# Patient Record
Sex: Female | Born: 1980 | Race: Black or African American | Hispanic: No | Marital: Married | Smoking: Never smoker
Health system: Southern US, Community
[De-identification: ages and names within clinical notes are randomized; demographics above are authoritative.]

## PROBLEM LIST (undated history)

## (undated) ENCOUNTER — Inpatient Hospital Stay (HOSPITAL_COMMUNITY): Payer: Self-pay

## (undated) DIAGNOSIS — K219 Gastro-esophageal reflux disease without esophagitis: Secondary | ICD-10-CM

## (undated) DIAGNOSIS — M1990 Unspecified osteoarthritis, unspecified site: Secondary | ICD-10-CM

## (undated) DIAGNOSIS — M549 Dorsalgia, unspecified: Secondary | ICD-10-CM

## (undated) DIAGNOSIS — I10 Essential (primary) hypertension: Secondary | ICD-10-CM

## (undated) DIAGNOSIS — Z6841 Body Mass Index (BMI) 40.0 and over, adult: Secondary | ICD-10-CM

## (undated) DIAGNOSIS — K760 Fatty (change of) liver, not elsewhere classified: Secondary | ICD-10-CM

## (undated) DIAGNOSIS — F419 Anxiety disorder, unspecified: Secondary | ICD-10-CM

## (undated) DIAGNOSIS — K5000 Crohn's disease of small intestine without complications: Secondary | ICD-10-CM

## (undated) DIAGNOSIS — K5900 Constipation, unspecified: Secondary | ICD-10-CM

## (undated) DIAGNOSIS — L509 Urticaria, unspecified: Secondary | ICD-10-CM

## (undated) DIAGNOSIS — E559 Vitamin D deficiency, unspecified: Secondary | ICD-10-CM

## (undated) DIAGNOSIS — J069 Acute upper respiratory infection, unspecified: Secondary | ICD-10-CM

## (undated) DIAGNOSIS — S3210XA Unspecified fracture of sacrum, initial encounter for closed fracture: Secondary | ICD-10-CM

## (undated) DIAGNOSIS — M2550 Pain in unspecified joint: Secondary | ICD-10-CM

## (undated) DIAGNOSIS — T7840XA Allergy, unspecified, initial encounter: Secondary | ICD-10-CM

## (undated) DIAGNOSIS — J45909 Unspecified asthma, uncomplicated: Secondary | ICD-10-CM

## (undated) DIAGNOSIS — E538 Deficiency of other specified B group vitamins: Secondary | ICD-10-CM

## (undated) DIAGNOSIS — K589 Irritable bowel syndrome without diarrhea: Secondary | ICD-10-CM

## (undated) HISTORY — DX: Vitamin D deficiency, unspecified: E55.9

## (undated) HISTORY — DX: Body Mass Index (BMI) 40.0 and over, adult: Z684

## (undated) HISTORY — PX: ADENOIDECTOMY: SUR15

## (undated) HISTORY — DX: Constipation, unspecified: K59.00

## (undated) HISTORY — DX: Dorsalgia, unspecified: M54.9

## (undated) HISTORY — PX: COLONOSCOPY: SHX174

## (undated) HISTORY — PX: TONSILLECTOMY: SUR1361

## (undated) HISTORY — DX: Allergy, unspecified, initial encounter: T78.40XA

## (undated) HISTORY — DX: Acute upper respiratory infection, unspecified: J06.9

## (undated) HISTORY — DX: Morbid (severe) obesity due to excess calories: E66.01

## (undated) HISTORY — DX: Pain in unspecified joint: M25.50

## (undated) HISTORY — DX: Essential (primary) hypertension: I10

## (undated) HISTORY — DX: Anxiety disorder, unspecified: F41.9

## (undated) HISTORY — DX: Gastro-esophageal reflux disease without esophagitis: K21.9

## (undated) HISTORY — DX: Fatty (change of) liver, not elsewhere classified: K76.0

## (undated) HISTORY — DX: Deficiency of other specified B group vitamins: E53.8

## (undated) HISTORY — DX: Crohn's disease of small intestine without complications: K50.00

## (undated) HISTORY — DX: Urticaria, unspecified: L50.9

## (undated) SURGERY — Surgical Case
Anesthesia: *Unknown

## (undated) SURGERY — DILATION AND EVACUATION, UTERUS
Anesthesia: Choice

---

## 2005-04-08 ENCOUNTER — Emergency Department (HOSPITAL_COMMUNITY): Admission: EM | Admit: 2005-04-08 | Discharge: 2005-04-08 | Payer: Self-pay | Admitting: Emergency Medicine

## 2005-04-12 HISTORY — PX: TONSILLECTOMY AND ADENOIDECTOMY: SHX28

## 2005-11-05 ENCOUNTER — Emergency Department (HOSPITAL_COMMUNITY): Admission: EM | Admit: 2005-11-05 | Discharge: 2005-11-06 | Payer: Self-pay | Admitting: Emergency Medicine

## 2006-02-16 ENCOUNTER — Emergency Department (HOSPITAL_COMMUNITY): Admission: EM | Admit: 2006-02-16 | Discharge: 2006-02-16 | Payer: Self-pay | Admitting: Family Medicine

## 2006-03-29 ENCOUNTER — Emergency Department (HOSPITAL_COMMUNITY): Admission: EM | Admit: 2006-03-29 | Discharge: 2006-03-29 | Payer: Self-pay | Admitting: Emergency Medicine

## 2006-08-26 ENCOUNTER — Emergency Department (HOSPITAL_COMMUNITY): Admission: EM | Admit: 2006-08-26 | Discharge: 2006-08-26 | Payer: Self-pay | Admitting: Family Medicine

## 2006-11-30 ENCOUNTER — Emergency Department (HOSPITAL_COMMUNITY): Admission: EM | Admit: 2006-11-30 | Discharge: 2006-11-30 | Payer: Self-pay | Admitting: Emergency Medicine

## 2007-05-22 ENCOUNTER — Encounter: Admission: RE | Admit: 2007-05-22 | Discharge: 2007-05-22 | Payer: Self-pay | Admitting: Family Medicine

## 2008-01-17 ENCOUNTER — Emergency Department (HOSPITAL_COMMUNITY): Admission: EM | Admit: 2008-01-17 | Discharge: 2008-01-17 | Payer: Self-pay | Admitting: Emergency Medicine

## 2008-06-28 ENCOUNTER — Other Ambulatory Visit: Admission: RE | Admit: 2008-06-28 | Discharge: 2008-06-28 | Payer: Self-pay | Admitting: Family Medicine

## 2008-12-13 ENCOUNTER — Emergency Department (HOSPITAL_COMMUNITY): Admission: EM | Admit: 2008-12-13 | Discharge: 2008-12-13 | Payer: Self-pay | Admitting: Emergency Medicine

## 2009-11-25 ENCOUNTER — Emergency Department (HOSPITAL_BASED_OUTPATIENT_CLINIC_OR_DEPARTMENT_OTHER): Admission: EM | Admit: 2009-11-25 | Discharge: 2009-11-25 | Payer: Self-pay | Admitting: Emergency Medicine

## 2009-11-25 ENCOUNTER — Ambulatory Visit: Payer: Self-pay | Admitting: Radiology

## 2010-01-11 ENCOUNTER — Emergency Department (HOSPITAL_BASED_OUTPATIENT_CLINIC_OR_DEPARTMENT_OTHER): Admission: EM | Admit: 2010-01-11 | Discharge: 2010-01-11 | Payer: Self-pay | Admitting: Emergency Medicine

## 2010-01-11 ENCOUNTER — Ambulatory Visit: Payer: Self-pay | Admitting: Diagnostic Radiology

## 2010-06-26 LAB — HEPATIC FUNCTION PANEL
Albumin: 3.8 g/dL (ref 3.5–5.2)
Alkaline Phosphatase: 99 U/L (ref 39–117)
Indirect Bilirubin: 0.7 mg/dL (ref 0.3–0.9)
Total Protein: 7.9 g/dL (ref 6.0–8.3)

## 2010-06-26 LAB — DIFFERENTIAL
Basophils Absolute: 0 10*3/uL (ref 0.0–0.1)
Basophils Relative: 1 % (ref 0–1)
Eosinophils Absolute: 0.1 10*3/uL (ref 0.0–0.7)
Monocytes Relative: 6 % (ref 3–12)
Neutro Abs: 4.3 10*3/uL (ref 1.7–7.7)
Neutrophils Relative %: 58 % (ref 43–77)

## 2010-06-26 LAB — BASIC METABOLIC PANEL
BUN: 8 mg/dL (ref 6–23)
CO2: 26 mEq/L (ref 19–32)
Calcium: 9.1 mg/dL (ref 8.4–10.5)
Creatinine, Ser: 0.8 mg/dL (ref 0.4–1.2)
GFR calc non Af Amer: >60 mL/min (ref >60)
Glucose, Bld: 89 mg/dL (ref 70–99)
Sodium: 141 mEq/L (ref 135–145)

## 2010-06-26 LAB — URINALYSIS, ROUTINE W REFLEX MICROSCOPIC
Bilirubin Urine: NEGATIVE
Hgb urine dipstick: NEGATIVE
Nitrite: NEGATIVE
Protein, ur: NEGATIVE mg/dL
Specific Gravity, Urine: 1.023 (ref 1.005–1.030)
Urobilinogen, UA: 0.2 mg/dL (ref 0.0–1.0)

## 2010-06-26 LAB — CBC
MCH: 27.5 pg (ref 26.0–34.0)
MCHC: 33.6 g/dL (ref 30.0–36.0)
Platelets: 269 10*3/uL (ref 150–400)
RDW: 12 % (ref 11.5–15.5)

## 2010-06-26 LAB — PREGNANCY, URINE: Preg Test, Ur: NEGATIVE

## 2010-10-16 ENCOUNTER — Other Ambulatory Visit: Payer: Self-pay

## 2010-10-16 ENCOUNTER — Emergency Department (INDEPENDENT_AMBULATORY_CARE_PROVIDER_SITE_OTHER): Payer: BC Managed Care – PPO

## 2010-10-16 ENCOUNTER — Emergency Department (HOSPITAL_BASED_OUTPATIENT_CLINIC_OR_DEPARTMENT_OTHER)
Admission: EM | Admit: 2010-10-16 | Discharge: 2010-10-16 | Disposition: A | Discharge status: Home / Self Care | Payer: BC Managed Care – PPO | Attending: Emergency Medicine | Admitting: Emergency Medicine

## 2010-10-16 DIAGNOSIS — R0602 Shortness of breath: Secondary | ICD-10-CM

## 2010-10-16 DIAGNOSIS — I10 Essential (primary) hypertension: Secondary | ICD-10-CM | POA: Insufficient documentation

## 2010-10-16 DIAGNOSIS — R300 Dysuria: Secondary | ICD-10-CM | POA: Insufficient documentation

## 2010-10-16 DIAGNOSIS — Z79899 Other long term (current) drug therapy: Secondary | ICD-10-CM | POA: Insufficient documentation

## 2010-10-16 DIAGNOSIS — J45909 Unspecified asthma, uncomplicated: Secondary | ICD-10-CM | POA: Insufficient documentation

## 2010-10-16 DIAGNOSIS — R0609 Other forms of dyspnea: Secondary | ICD-10-CM | POA: Insufficient documentation

## 2010-10-16 DIAGNOSIS — R002 Palpitations: Secondary | ICD-10-CM | POA: Insufficient documentation

## 2010-10-16 DIAGNOSIS — R0989 Other specified symptoms and signs involving the circulatory and respiratory systems: Secondary | ICD-10-CM | POA: Insufficient documentation

## 2010-10-16 DIAGNOSIS — R0789 Other chest pain: Secondary | ICD-10-CM

## 2010-10-16 LAB — CBC
Hemoglobin: 12.6 g/dL (ref 12.0–15.0)
MCV: 78.4 fL (ref 78.0–100.0)
Platelets: 253 10*3/uL (ref 150–400)
RBC: 4.76 MIL/uL (ref 3.87–5.11)
WBC: 9.7 10*3/uL (ref 4.0–10.5)

## 2010-10-16 LAB — DIFFERENTIAL
Basophils Relative: 0 % (ref 0–1)
Eosinophils Absolute: 0.1 10*3/uL (ref 0.0–0.7)
Lymphs Abs: 4.3 10*3/uL — ABNORMAL HIGH (ref 0.7–4.0)
Monocytes Relative: 10 % (ref 3–12)
Neutro Abs: 4.3 10*3/uL (ref 1.7–7.7)
Neutrophils Relative %: 44 % (ref 43–77)

## 2010-10-16 LAB — D-DIMER, QUANTITATIVE: D-Dimer, Quant: 0.27 ug/mL-FEU (ref 0.00–0.48)

## 2010-10-16 LAB — BASIC METABOLIC PANEL
CO2: 24 mEq/L (ref 19–32)
Chloride: 103 mEq/L (ref 96–112)
GFR calc Af Amer: >60 mL/min (ref >60)
Potassium: 3.7 mEq/L (ref 3.5–5.1)

## 2011-01-11 LAB — COMPREHENSIVE METABOLIC PANEL
AST: 26
CO2: 21
Calcium: 8.3 — ABNORMAL LOW
Creatinine, Ser: 0.76
GFR calc Af Amer: >60
GFR calc non Af Amer: >60
Total Protein: 6.7

## 2011-01-11 LAB — URINALYSIS, ROUTINE W REFLEX MICROSCOPIC
Glucose, UA: NEGATIVE
Nitrite: NEGATIVE
Specific Gravity, Urine: 1.02
pH: 5.5

## 2011-01-11 LAB — CBC
MCHC: 33.3
MCV: 82.8
Platelets: 272
RDW: 12.6

## 2011-01-11 LAB — URINE MICROSCOPIC-ADD ON

## 2011-01-11 LAB — DIFFERENTIAL
Eosinophils Relative: 1
Lymphocytes Relative: 20
Lymphs Abs: 2.2

## 2011-01-11 LAB — LIPASE, BLOOD: Lipase: 28

## 2011-04-30 ENCOUNTER — Emergency Department (HOSPITAL_COMMUNITY)
Admission: EM | Admit: 2011-04-30 | Discharge: 2011-04-30 | Disposition: A | Discharge status: Home / Self Care | Payer: No Typology Code available for payment source | Attending: Emergency Medicine | Admitting: Emergency Medicine

## 2011-04-30 ENCOUNTER — Encounter (HOSPITAL_COMMUNITY): Payer: Self-pay | Admitting: Emergency Medicine

## 2011-04-30 ENCOUNTER — Emergency Department (HOSPITAL_COMMUNITY): Payer: No Typology Code available for payment source

## 2011-04-30 DIAGNOSIS — R079 Chest pain, unspecified: Secondary | ICD-10-CM | POA: Insufficient documentation

## 2011-04-30 DIAGNOSIS — N949 Unspecified condition associated with female genital organs and menstrual cycle: Secondary | ICD-10-CM | POA: Insufficient documentation

## 2011-04-30 DIAGNOSIS — S139XXA Sprain of joints and ligaments of unspecified parts of neck, initial encounter: Secondary | ICD-10-CM | POA: Insufficient documentation

## 2011-04-30 DIAGNOSIS — I10 Essential (primary) hypertension: Secondary | ICD-10-CM | POA: Insufficient documentation

## 2011-04-30 DIAGNOSIS — S161XXA Strain of muscle, fascia and tendon at neck level, initial encounter: Secondary | ICD-10-CM

## 2011-04-30 DIAGNOSIS — J449 Chronic obstructive pulmonary disease, unspecified: Secondary | ICD-10-CM | POA: Insufficient documentation

## 2011-04-30 DIAGNOSIS — M542 Cervicalgia: Secondary | ICD-10-CM | POA: Insufficient documentation

## 2011-04-30 DIAGNOSIS — J4489 Other specified chronic obstructive pulmonary disease: Secondary | ICD-10-CM | POA: Insufficient documentation

## 2011-04-30 DIAGNOSIS — M25559 Pain in unspecified hip: Secondary | ICD-10-CM | POA: Insufficient documentation

## 2011-04-30 HISTORY — DX: Essential (primary) hypertension: I10

## 2011-04-30 MED ORDER — SODIUM CHLORIDE 0.9 % IV BOLUS (SEPSIS)
1000.0000 mL | Freq: Once | INTRAVENOUS | Status: AC
  Administered 2011-04-30: 1000 mL via INTRAVENOUS

## 2011-04-30 MED ORDER — HYDROCODONE-ACETAMINOPHEN 5-500 MG PO TABS: 1.0000 | ORAL_TABLET | Freq: Four times a day (QID) | ORAL | Status: AC | PRN

## 2011-04-30 MED ORDER — IBUPROFEN 800 MG PO TABS: 800.0000 mg | ORAL_TABLET | Freq: Three times a day (TID) | ORAL | Status: AC

## 2011-04-30 MED ORDER — MORPHINE SULFATE 4 MG/ML IJ SOLN
6.0000 mg | Freq: Once | INTRAMUSCULAR | Status: AC
  Administered 2011-04-30: 6 mg via INTRAVENOUS
  Filled 2011-04-30: qty 2

## 2011-04-30 NOTE — ED Notes (Signed)
Reports decrease in pain to 4/10. Family at bedside. Awaiting CT results.
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
## 2012-02-24 ENCOUNTER — Encounter (HOSPITAL_COMMUNITY): Payer: Self-pay | Admitting: *Deleted

## 2012-02-24 ENCOUNTER — Other Ambulatory Visit: Payer: Self-pay | Admitting: Obstetrics and Gynecology

## 2012-02-24 ENCOUNTER — Encounter (HOSPITAL_COMMUNITY): Payer: Self-pay | Admitting: Pharmacy Technician

## 2012-02-24 NOTE — H&P (Signed)
31 y.o. yo G1P0 with missed ab.  BHCG went from 937 to 837 over 2 days.  There is a gs in uterus but no yolk sac or fetal pole.  U/S is otherwise normal without free fluid or masses.  Past Medical History  Diagnosis Date  . COPD (chronic obstructive pulmonary disease)   . Hypertension   No past surgical history on file.  History   Social History  . Marital Status: Married    Spouse Name: N/A    Number of Children: N/A  . Years of Education: N/A   Occupational History  . Not on file.   Social History Main Topics  . Smoking status: Never Smoker   . Smokeless tobacco: Not on file  . Alcohol Use: No  . Drug Use: No  . Sexually Active:    Other Topics Concern  . Not on file   Social History Narrative  . No narrative on file    No current facility-administered medications on file prior to encounter.   Current Outpatient Prescriptions on File Prior to Encounter  Medication Sig Dispense Refill  . albuterol (PROVENTIL HFA;VENTOLIN HFA) 108 (90 BASE) MCG/ACT inhaler Inhale 1 puff into the lungs every 6 (six) hours as needed. For shortness of breath.        Allergies  Allergen Reactions  . Ciprofloxacin   . Lamictal (Lamotrigine)   . Penicillins     @VITALS2 @  Lungs: clear to ascultation Cor:  RRR Abdomen:  soft, nontender, nondistended. Ex:  no cords, erythema Pelvic:  NEFG, nml s/s uterus about 6 weeks.  No masses.  A:  For D&E.   P: All risks, benefits and alternatives d/w patient and she desires to proceed . Will follow for pathology confirmation of IUP.  Will check blood type. Rosana Farnell A

## 2012-02-25 ENCOUNTER — Encounter (HOSPITAL_COMMUNITY): Payer: Self-pay | Admitting: Anesthesiology

## 2012-02-25 ENCOUNTER — Ambulatory Visit (HOSPITAL_COMMUNITY)
Admission: RE | Admit: 2012-02-25 | Discharge: 2012-02-25 | Disposition: A | Discharge status: Home / Self Care | Payer: BC Managed Care – PPO | Source: Ambulatory Visit | Attending: Obstetrics and Gynecology | Admitting: Obstetrics and Gynecology

## 2012-02-25 ENCOUNTER — Encounter (HOSPITAL_COMMUNITY): Payer: Self-pay | Admitting: *Deleted

## 2012-02-25 ENCOUNTER — Encounter (HOSPITAL_COMMUNITY)
Admission: RE | Disposition: A | Discharge status: Home / Self Care | Payer: Self-pay | Source: Ambulatory Visit | Attending: Obstetrics and Gynecology

## 2012-02-25 DIAGNOSIS — O021 Missed abortion: Secondary | ICD-10-CM | POA: Insufficient documentation

## 2012-02-25 DIAGNOSIS — Z5309 Procedure and treatment not carried out because of other contraindication: Secondary | ICD-10-CM | POA: Insufficient documentation

## 2012-02-25 HISTORY — PX: DILATION AND EVACUATION: SHX1459

## 2012-02-25 HISTORY — DX: Unspecified osteoarthritis, unspecified site: M19.90

## 2012-02-25 HISTORY — DX: Unspecified asthma, uncomplicated: J45.909

## 2012-02-25 LAB — CBC
HCT: 38.7 % (ref 36.0–46.0)
Platelets: 253 10*3/uL (ref 150–400)
RDW: 13.1 % (ref 11.5–15.5)
WBC: 10.1 10*3/uL (ref 4.0–10.5)

## 2012-02-25 LAB — ABO/RH: ABO/RH(D): B POS

## 2012-02-25 SURGERY — DILATION AND EVACUATION, UTERUS
Anesthesia: Choice

## 2012-02-25 MED ORDER — ONDANSETRON HCL 4 MG/2ML IJ SOLN
INTRAMUSCULAR | Status: AC
  Filled 2012-02-25: qty 2

## 2012-02-25 MED ORDER — DEXAMETHASONE SODIUM PHOSPHATE 10 MG/ML IJ SOLN
INTRAMUSCULAR | Status: AC
  Filled 2012-02-25: qty 1

## 2012-02-25 MED ORDER — FENTANYL CITRATE 0.05 MG/ML IJ SOLN
INTRAMUSCULAR | Status: AC
  Filled 2012-02-25: qty 4

## 2012-02-25 MED ORDER — LACTATED RINGERS IV SOLN: INTRAVENOUS | Status: DC

## 2012-02-25 MED ORDER — KETOROLAC TROMETHAMINE 30 MG/ML IJ SOLN
INTRAMUSCULAR | Status: AC
  Filled 2012-02-25: qty 1

## 2012-02-25 MED ORDER — PROPOFOL 10 MG/ML IV EMUL
INTRAVENOUS | Status: AC
  Filled 2012-02-25: qty 20

## 2012-02-25 MED ORDER — OXYCODONE-ACETAMINOPHEN 5-325 MG PO TABS: 1.0000 | ORAL_TABLET | ORAL | Status: DC | PRN

## 2012-02-25 MED ORDER — LIDOCAINE HCL (CARDIAC) 20 MG/ML IV SOLN
INTRAVENOUS | Status: AC
  Filled 2012-02-25: qty 5

## 2012-02-25 MED ORDER — MISOPROSTOL 200 MCG PO TABS: 200.0000 ug | ORAL_TABLET | Freq: Four times a day (QID) | ORAL | Status: DC

## 2012-02-25 MED ORDER — ONDANSETRON HCL 8 MG PO TABS: 8.0000 mg | ORAL_TABLET | Freq: Three times a day (TID) | ORAL | Status: DC | PRN

## 2012-02-25 MED ORDER — MIDAZOLAM HCL 2 MG/2ML IJ SOLN
INTRAMUSCULAR | Status: AC
  Filled 2012-02-25: qty 2

## 2012-02-25 SURGICAL SUPPLY — 16 items
CATH ROBINSON RED A/P 16FR (CATHETERS) ×1 IMPLANT
CLOTH BEACON ORANGE TIMEOUT ST (SAFETY) ×1 IMPLANT
DECANTER SPIKE VIAL GLASS SM (MISCELLANEOUS) ×1 IMPLANT
GLOVE BIO SURGEON STRL SZ7 (GLOVE) ×2 IMPLANT
GOWN STRL REIN XL XLG (GOWN DISPOSABLE) ×1 IMPLANT
KIT BERKELEY 1ST TRIMESTER 3/8 (MISCELLANEOUS) ×1 IMPLANT
NS IRRIG 1000ML POUR BTL (IV SOLUTION) ×1 IMPLANT
PACK VAGINAL MINOR WOMEN LF (CUSTOM PROCEDURE TRAY) ×1 IMPLANT
PAD OB MATERNITY 4.3X12.25 (PERSONAL CARE ITEMS) ×1 IMPLANT
PAD PREP 24X48 CUFFED NSTRL (MISCELLANEOUS) ×1 IMPLANT
SET BERKELEY SUCTION TUBING (SUCTIONS) ×1 IMPLANT
TOWEL OR 17X24 6PK STRL BLUE (TOWEL DISPOSABLE) ×2 IMPLANT
VACURETTE 10 RIGID CVD (CANNULA) IMPLANT
VACURETTE 7MM CVD STRL WRAP (CANNULA) IMPLANT
VACURETTE 8 RIGID CVD (CANNULA) IMPLANT
VACURETTE 9 RIGID CVD (CANNULA) IMPLANT

## 2012-02-25 NOTE — Preoperative (Signed)
Beta Blockers   Reason not to administer Beta Blockers:Not Applicable 

## 2012-02-25 NOTE — Progress Notes (Signed)
Pt has no IV access.  On u/s there is a gestational sac only- no FP.  I counseled patient that to do a central line for a minor surgery on a MAB that may be easily passed with medication only increases complications from the surgery.  I have cancelled the D&E and counseled the patient to use cytotec for passing of the blighted ovum.  I also counseled the patient what the risks of bleeding are and the signs to look out for including bleeding a pad an hour for 5 or more hours in a row, fever and severe abdominal pain not like cramping.  I counseled the patient that we will need to follow her BHGs down to 0 to make sure she does not have an ectopic.  I am sending her home with cytotec, percocet and zofran for possible nausea.

## 2012-02-25 NOTE — Progress Notes (Signed)
Surgery was cancelled - unable to access IV site for surgery.  Dr Philis Pique spoke with patient and husband, rx given to patient by MD.  MD istructed patient to return to office in 1 week.  Patient d/c home.

## 2012-02-28 ENCOUNTER — Encounter (HOSPITAL_COMMUNITY): Payer: Self-pay | Admitting: Obstetrics and Gynecology

## 2012-11-03 DIAGNOSIS — Z79899 Other long term (current) drug therapy: Secondary | ICD-10-CM | POA: Insufficient documentation

## 2012-11-03 DIAGNOSIS — Z91040 Latex allergy status: Secondary | ICD-10-CM | POA: Insufficient documentation

## 2012-11-03 DIAGNOSIS — R05 Cough: Secondary | ICD-10-CM | POA: Insufficient documentation

## 2012-11-03 DIAGNOSIS — Z880 Allergy status to penicillin: Secondary | ICD-10-CM | POA: Insufficient documentation

## 2012-11-03 DIAGNOSIS — I10 Essential (primary) hypertension: Secondary | ICD-10-CM | POA: Insufficient documentation

## 2012-11-03 DIAGNOSIS — R059 Cough, unspecified: Secondary | ICD-10-CM | POA: Insufficient documentation

## 2012-11-03 DIAGNOSIS — J45901 Unspecified asthma with (acute) exacerbation: Secondary | ICD-10-CM | POA: Insufficient documentation

## 2012-11-03 DIAGNOSIS — M47817 Spondylosis without myelopathy or radiculopathy, lumbosacral region: Secondary | ICD-10-CM | POA: Insufficient documentation

## 2012-11-04 ENCOUNTER — Emergency Department (HOSPITAL_COMMUNITY)
Admission: EM | Admit: 2012-11-04 | Discharge: 2012-11-04 | Disposition: A | Discharge status: Home / Self Care | Payer: BC Managed Care – PPO | Attending: Emergency Medicine | Admitting: Emergency Medicine

## 2012-11-04 ENCOUNTER — Encounter (HOSPITAL_COMMUNITY): Payer: Self-pay | Admitting: Emergency Medicine

## 2012-11-04 DIAGNOSIS — J4521 Mild intermittent asthma with (acute) exacerbation: Secondary | ICD-10-CM

## 2012-11-04 MED ORDER — ALBUTEROL SULFATE HFA 108 (90 BASE) MCG/ACT IN AERS
2.0000 | INHALATION_SPRAY | RESPIRATORY_TRACT | Status: DC
  Administered 2012-11-04: 2 via RESPIRATORY_TRACT
  Filled 2012-11-04: qty 6.7

## 2012-11-04 MED ORDER — ALBUTEROL SULFATE HFA 108 (90 BASE) MCG/ACT IN AERS: 2.0000 | INHALATION_SPRAY | RESPIRATORY_TRACT | Status: DC | PRN

## 2012-11-04 MED ORDER — BENZONATATE 100 MG PO CAPS: 200.0000 mg | ORAL_CAPSULE | Freq: Three times a day (TID) | ORAL | Status: DC | PRN

## 2012-11-04 MED ORDER — ALBUTEROL SULFATE (5 MG/ML) 0.5% IN NEBU
2.5000 mg | INHALATION_SOLUTION | Freq: Once | RESPIRATORY_TRACT | Status: AC
  Administered 2012-11-04: 2.5 mg via RESPIRATORY_TRACT
  Filled 2012-11-04: qty 0.5

## 2012-11-04 MED ORDER — HYDROCOD POLST-CHLORPHEN POLST 10-8 MG/5ML PO LQCR: 5.0000 mL | Freq: Two times a day (BID) | ORAL | Status: DC | PRN

## 2012-11-04 MED ORDER — HYDROCOD POLST-CHLORPHEN POLST 10-8 MG/5ML PO LQCR
5.0000 mL | Freq: Once | ORAL | Status: AC
  Administered 2012-11-04: 5 mL via ORAL
  Filled 2012-11-04: qty 5

## 2012-11-04 MED ORDER — IPRATROPIUM BROMIDE 0.02 % IN SOLN
0.5000 mg | Freq: Once | RESPIRATORY_TRACT | Status: DC
  Filled 2012-11-04: qty 2.5

## 2012-11-04 MED ORDER — BENZONATATE 200 MG PO CAPS: 200.0000 mg | ORAL_CAPSULE | Freq: Three times a day (TID) | ORAL | Status: DC | PRN

## 2012-11-04 NOTE — ED Provider Notes (Signed)
CSN: [id number]     Arrival date & time 11/03/12  2359 History     First MD Initiated Contact with Patient 11/04/12 0004     Chief Complaint  Patient presents with  . Asthma   (Consider location/radiation/quality/duration/timing/severity/associated sxs/prior Treatment) HPI 32 yo female presents to the ER from home with complaint of cough, asthma exacerbation.  Pt has very mild asthma, 2-3 exacerbations at year.  No prior admissions.  Pt has used her rescue inhaler without improvement.  No fevers.  Cough productive of white "chunks".  No URI sxs.  Past Medical History  Diagnosis Date  . Hypertension     no meds x 3 yrs  . Asthma   . Arthritis     L5 Back - no meds - otc prn   Past Surgical History  Procedure Laterality Date  . Tonsillectomy  2007  . Tonsillectomy and adenoidectomy  2007  . Dilation and evacuation  02/25/2012    Procedure: DILATATION AND EVACUATION;  Surgeon: Daria Pastures, MD;  Location: [facility];  Service: Gynecology;  Laterality: N/A;   No family history on file. History  Substance Use Topics  . Smoking status: Never Smoker   . Smokeless tobacco: Never Used  . Alcohol Use: No   OB History   Grav Para Term Preterm Abortions TAB SAB Ect Mult Living   1              Review of Systems  All other systems reviewed and are negative.    Allergies  Latex; Peanut-containing drug products; Penicillins; Shellfish allergy; Chocolate; Ciprofloxacin; Lamisil; and Wheat bran  Home Medications   Current Outpatient Rx  Name  Route  Sig  Dispense  Refill  . albuterol (PROVENTIL HFA;VENTOLIN HFA) 108 (90 BASE) MCG/ACT inhaler   Inhalation   Inhale 1 puff into the lungs every 6 (six) hours as needed for wheezing or shortness of breath. For shortness of breath.         . cetirizine (ZYRTEC) 10 MG tablet   Oral   Take 10 mg by mouth every morning.          Marland Kitchen guaiFENesin (ROBITUSSIN) 100 MG/5ML SOLN   Oral   Take 5 mLs by mouth every 4 (four) hours as  needed (for cough).         Marland Kitchen ibuprofen (ADVIL,MOTRIN) 200 MG tablet   Oral   Take 600 mg by mouth every 8 (eight) hours as needed for pain.          BP 165/98  Pulse 71  Temp(Src) 98.1 F (36.7 C) (Oral)  Resp 22  SpO2 100%  LMP 10/26/2012  Breastfeeding? Unknown Physical Exam  Nursing note and vitals reviewed. Constitutional: She is oriented to person, place, and time. She appears well-developed and well-nourished.  HENT:  Head: Normocephalic and atraumatic.  Nose: Nose normal.  Mouth/Throat: Oropharynx is clear and moist.  Eyes: Conjunctivae and EOM are normal. Pupils are equal, round, and reactive to light.  Neck: Normal range of motion. Neck supple. No JVD present. No tracheal deviation present. No thyromegaly present.  Cardiovascular: Normal rate, regular rhythm, normal heart sounds and intact distal pulses.  Exam reveals no gallop and no friction rub.   No murmur heard. Pulmonary/Chest: Effort normal. No stridor. No respiratory distress. She has wheezes (very mild end expiratory wheezing, cough). She has no rales. She exhibits no tenderness.  Abdominal: Soft. Bowel sounds are normal. She exhibits no distension and no mass. There is no  tenderness. There is no rebound and no guarding.  Musculoskeletal: Normal range of motion. She exhibits no edema and no tenderness.  Lymphadenopathy:    She has no cervical adenopathy.  Neurological: She is alert and oriented to person, place, and time. She exhibits normal muscle tone. Coordination normal.  Skin: Skin is warm and dry. No rash noted. No erythema. No pallor.  Psychiatric: She has a normal mood and affect. Her behavior is normal. Judgment and thought content normal.    ED Course   Procedures (including critical care time)  Labs Reviewed - No data to display No results found. 1. Asthma exacerbation, mild intermittent     MDM  32 yo female with cough, wheeze, mild asthma exacerbation.  Will give cough suppressants,  albuterol/atrovent.    12:58 AM No further coughing or wheezing.  Pt feeling much better, ready to go home.  Kalman Drape, MD 11/04/12 [phone]

## 2012-11-04 NOTE — ED Notes (Signed)
Pt c/o shob, cough. Pt has used rescue inhaler today without relief. Mild distress noted.

## 2012-11-14 ENCOUNTER — Encounter: Payer: Self-pay | Admitting: Family

## 2012-11-14 ENCOUNTER — Ambulatory Visit (INDEPENDENT_AMBULATORY_CARE_PROVIDER_SITE_OTHER): Payer: BC Managed Care – PPO | Admitting: Family

## 2012-11-14 VITALS — BP 136/84 | HR 84 | Wt 356.0 lb

## 2012-11-14 DIAGNOSIS — J4531 Mild persistent asthma with (acute) exacerbation: Secondary | ICD-10-CM

## 2012-11-14 DIAGNOSIS — J4530 Mild persistent asthma, uncomplicated: Secondary | ICD-10-CM

## 2012-11-14 DIAGNOSIS — R05 Cough: Secondary | ICD-10-CM

## 2012-11-14 DIAGNOSIS — J45901 Unspecified asthma with (acute) exacerbation: Secondary | ICD-10-CM

## 2012-11-14 DIAGNOSIS — R062 Wheezing: Secondary | ICD-10-CM

## 2012-11-14 MED ORDER — PREDNISONE 20 MG PO TABS: ORAL_TABLET | ORAL | Status: DC

## 2012-11-14 MED ORDER — AZITHROMYCIN 250 MG PO TABS: ORAL_TABLET | ORAL | Status: DC

## 2012-11-14 NOTE — Progress Notes (Signed)
Subjective:    Patient ID: Sydney Bradley, female    DOB: 12-26-1980, 32 y.o.   MRN: [id number]  HPI 32 year old African American female, nonsmoker is seen as a hospital followup from 11/04/2012. Patient was seen in the emergency department with asthma exacerbation. She was treated with an inhaler, codeine cough syrup, and Zyrtec that has not been effective. She continues to have a productive cough with white phlegm. Continues to have wheezing, shortness of breath or hoarseness. Overall she is no better. She has a history of pneumonia.   Review of Systems  Constitutional: Negative.   HENT: Negative.   Respiratory: Positive for cough, shortness of breath and wheezing.   Cardiovascular: Negative.   Gastrointestinal: Negative.   Genitourinary: Negative.   Skin: Negative.   Allergic/Immunologic: Negative.   Neurological: Negative.   Hematological: Negative.   Psychiatric/Behavioral: Negative.    Past Medical History  Diagnosis Date  . Hypertension     no meds x 3 yrs  . Asthma   . Arthritis     L5 Back - no meds - otc prn    History   Social History  . Marital Status: Married    Spouse Name: N/A    Number of Children: N/A  . Years of Education: N/A   Occupational History  . Not on file.   Social History Main Topics  . Smoking status: Never Smoker   . Smokeless tobacco: Never Used  . Alcohol Use: No  . Drug Use: No  . Sexually Active: Yes    Birth Control/ Protection: None     Comment: approx [redacted]wks gestation   Other Topics Concern  . Not on file   Social History Narrative  . No narrative on file    Past Surgical History  Procedure Laterality Date  . Tonsillectomy  2007  . Tonsillectomy and adenoidectomy  2007  . Dilation and evacuation  02/25/2012    Procedure: DILATATION AND EVACUATION;  Surgeon: Daria Pastures, MD;  Location: [facility];  Service: Gynecology;  Laterality: N/A;    No family history on file.  Allergies  Allergen Reactions  . Latex Hives   Breathing problems  . 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 Penicillins Anaphylaxis  . Shellfish Allergy Anaphylaxis  . Chocolate Itching  . Ciprofloxacin Hives  . Lamisil (Terbinafine) Hives  . Wheat Bran Itching    Current Outpatient Prescriptions on File Prior to Visit  Medication Sig Dispense Refill  . albuterol (PROVENTIL HFA;VENTOLIN HFA) 108 (90 BASE) MCG/ACT inhaler Inhale 1 puff into the lungs every 6 (six) hours as needed for wheezing or shortness of breath. For shortness of breath.      Marland Kitchen albuterol (PROVENTIL HFA;VENTOLIN HFA) 108 (90 BASE) MCG/ACT inhaler Inhale 2 puffs into the lungs every 4 (four) hours as needed for wheezing.  1 Inhaler  0  . benzonatate (TESSALON) 200 MG capsule Take 1 capsule (200 mg total) by mouth 3 (three) times daily as needed for cough.  20 capsule  0  . cetirizine (ZYRTEC) 10 MG tablet Take 10 mg by mouth every morning.       . chlorpheniramine-HYDROcodone (TUSSIONEX) 10-8 MG/5ML LQCR Take 5 mLs by mouth every 12 (twelve) hours as needed (cough).  140 mL  0  . ibuprofen (ADVIL,MOTRIN) 200 MG tablet Take 600 mg by mouth every 8 (eight) hours as needed for pain.      Marland Kitchen guaiFENesin (ROBITUSSIN) 100 MG/5ML SOLN Take 5 mLs by  mouth every 4 (four) hours as needed (for cough).       No current facility-administered medications on file prior to visit.    BP 136/84  Pulse 84  Wt 356 lb (161.481 kg)  BMI 50.34 kg/m2  SpO2 96%  LMP 07/17/2014chart    Objective:   Physical Exam  Constitutional: She is oriented to person, place, and time. She appears well-developed and well-nourished.  HENT:  Right Ear: External ear normal.  Left Ear: External ear normal.  Nose: Nose normal.  Mouth/Throat: Oropharynx is clear and moist.  Neck: Normal range of motion. Neck supple. No thyromegaly present.  Cardiovascular: Normal rate and normal heart sounds.   Pulmonary/Chest: Effort normal. She has wheezes.  Neurological: She is  alert and oriented to person, place, and time.  Skin: Skin is warm and dry.  Psychiatric: She has a normal mood and affect.          Assessment & Plan:  Assessment: 1. Asthma exacerbation 2. Cough 3. Pneumonia-likely  Plan: We'll treat with prednisone 60x3, 40x3, 20x3. Continue inhaler as needed. Z-Pak as directed. Patient going off his with any questions or concerns. Recheck as scheduled, and as needed. Consider chest x-ray if no better. Declines today.

## 2012-11-14 NOTE — Patient Instructions (Signed)
Asthma, Acute Bronchospasm Your exam shows you have asthma, or acute bronchospasm that acts like asthma. Bronchospasm means your air passages become narrowed. These conditions are due to inflammation and airway spasm that cause narrowing of the bronchial tubes in the lungs. This causes you to have wheezing and shortness of breath. POSSIBLE TRIGGERS  Animal dander from the skin, hair, or feathers of animals.  Dust mites contained in house dust.  Cockroaches.  Pollen from trees or grass.  Mold.  Cigarette or tobacco smoke.  Air pollutants such as dust, household cleaners, hair sprays, aerosol sprays, paint fumes, strong chemicals, or strong odors.  Cold air or weather changes. Cold air may cause inflammation. Winds increase molds and pollens in the air.  Strong emotions such as crying or laughing hard.  Stress.  Certain medicines such as aspirin or beta-blockers.  Sulfites in such foods and drinks as dried fruits and wine.  Infections or inflammatory conditions such as a flu, cold, or inflammation of the nasal membranes (rhinitis).  Gastroesophageal reflux disease (GERD). GERD is a condition where stomach acid backs up into your throat (esophagus).  Exercise or strenous activity. TREATMENT  Treatment is aimed at making the narrowed airways larger. Mild asthma or bronchospasm is usually controlled with inhaled medicines. Albuterol is a common medicine that you breathe in to open spastic or narrowed airways. Steroid medicine is also used to reduce the inflammation when an attack is moderate or severe. Antibiotics are only used if a bacterial infection is present.  HOME CARE INSTRUCTIONS   Rest.  Drink plenty of liquids. This helps the mucus to remain thin and easily coughed up. Do not use caffeine or alcohol.  Do not smoke. Avoid being exposed to secondhand smoke.  You play a critical role in keeping yourself in good health. Avoid exposure to things that cause you to wheeze.  Avoid exposure to things that cause you to have breathing problems. Keep your medicines up-to-date and available. Carefully follow your caregiver's treatment plan.  When pollen or pollution is bad, keep windows closed and use an air conditioner or go to places with air conditioning.  Take your medicine exactly as prescribed.  Asthma requires careful medical attention. See your caregiver for follow-up as advised. If you are more than [redacted] weeks pregnant and you were prescribed any new medicines, let your obstetrician know about the visit and how you are doing. Arrange a recheck. SEEK IMMEDIATE MEDICAL CARE IF:   You are getting worse.  You have trouble breathing. If severe, call your local emergency services 911 in [place]  You develop chest pain or discomfort.  You are throwing up or not drinking fluids.  You are coughing up yellow, green, brown, or bloody sputum.  You have a fever or persistent symptoms for more than 2 3 days.  You have a fever and your symptoms suddenly get worse.  You have trouble swallowing. MAKE SURE YOU:   Understand these instructions.  Will watch your condition.  Will get help right away if you are not doing well or get worse. Document Released: 07/14/2006 Document Revised: 03/15/2012 Document Reviewed: 03/13/2007 [facility] Patient Information 2014 [place], [place].

## 2012-11-17 ENCOUNTER — Encounter: Payer: Self-pay | Admitting: Family

## 2012-11-20 ENCOUNTER — Other Ambulatory Visit: Payer: Self-pay | Admitting: Family

## 2012-11-20 DIAGNOSIS — R05 Cough: Secondary | ICD-10-CM

## 2012-12-13 ENCOUNTER — Ambulatory Visit: Payer: BC Managed Care – PPO | Admitting: Family

## 2012-12-14 ENCOUNTER — Ambulatory Visit (INDEPENDENT_AMBULATORY_CARE_PROVIDER_SITE_OTHER): Payer: BC Managed Care – PPO | Admitting: Family

## 2012-12-14 ENCOUNTER — Encounter: Payer: Self-pay | Admitting: Family

## 2012-12-14 DIAGNOSIS — L708 Other acne: Secondary | ICD-10-CM

## 2012-12-14 DIAGNOSIS — L700 Acne vulgaris: Secondary | ICD-10-CM

## 2012-12-14 MED ORDER — MINOCYCLINE HCL 50 MG PO CAPS: 100.0000 mg | ORAL_CAPSULE | Freq: Two times a day (BID) | ORAL | Status: DC

## 2012-12-14 MED ORDER — LEVONORGESTREL-ETHINYL ESTRAD 0.15-30 MG-MCG PO TABS: 1.0000 | ORAL_TABLET | Freq: Every day | ORAL | Status: DC

## 2012-12-14 MED ORDER — PHENTERMINE HCL 37.5 MG PO CAPS: 37.5000 mg | ORAL_CAPSULE | ORAL | Status: DC

## 2012-12-14 NOTE — Patient Instructions (Addendum)
1. Eclos, plant stem cells facial cleanser twice a day.  Acne Acne is a skin problem that causes pimples. Acne occurs when the pores in your skin get blocked. Your pores may become red, sore, and swollen (inflamed), or infected with a common skin bacterium (Propionibacterium acnes). Acne is a common skin problem. Up to 80% of people get acne at some time. Acne is especially common from the ages of 29 to 70. Acne usually goes away over time with proper treatment. CAUSES  Your pores each contain an oil gland. The oil glands make an oily substance called sebum. Acne happens when these glands get plugged with sebum, dead skin cells, and dirt. The P. acnes bacteria that are normally found in the oil glands then multiply, causing inflammation. Acne is commonly triggered by changes in your hormones. These hormonal changes can cause the oil glands to get bigger and to make more sebum. Factors that can make acne worse include:  Hormone changes during adolescence.  Hormone changes during women's menstrual cycles.  Hormone changes during pregnancy.  Oil-based cosmetics and hair products.  Harshly scrubbing the skin.  Strong soaps.  Stress.  Hormone problems due to certain diseases.  Long or oily hair rubbing against the skin.  Certain medicines.  Pressure from headbands, backpacks, or shoulder pads.  Exposure to certain oils and chemicals. SYMPTOMS  Acne often occurs on the face, neck, chest, and upper back. Symptoms include:  Small, red bumps (pimples or papules).  Whiteheads (closed comedones).  Blackheads (open comedones).  Small, pus-filled pimples (pustules).  Big, red pimples or pustules that feel tender. More severe acne can cause:  An infected area that contains a collection of pus (abscess).  Hard, painful, fluid-filled sacs (cysts).  Scars. DIAGNOSIS  Your caregiver can usually tell what the problem is by doing a physical exam. TREATMENT  There are many good  treatments for acne. Some are available over-the-counter and some are available with a prescription. The treatment that is best for you depends on the type of acne you have and how severe it is. It may take 2 months of treatment before your acne gets better. Common treatments include:  Creams and lotions that prevent oil glands from clogging.  Creams and lotions that treat or prevent infections and inflammation.  Antibiotics applied to the skin or taken as a pill.  Pills that decrease sebum production.  Birth control pills.  Light or laser treatments.  Minor surgery.  Injections of medicine into the affected areas.  Chemicals that cause peeling of the skin. HOME CARE INSTRUCTIONS  Good skin care is the most important part of treatment.  Wash your skin gently at least twice a day and after exercise. Always wash your skin before bed.  Use mild soap.  After each wash, apply a water-based skin moisturizer.  Keep your hair clean and off of your face. Shampoo your hair daily.  Only take medicines as directed by your caregiver.  Use a sunscreen or sunblock with SPF 30 or greater. This is especially important when you are using acne medicines.  Choose cosmetics that are noncomedogenic. This means they do not plug the oil glands.  Avoid leaning your chin or forehead on your hands.  Avoid wearing tight headbands or hats.  Avoid picking or squeezing your pimples. This can make your acne worse and cause scarring. SEEK MEDICAL CARE IF:   Your acne is not better after 8 weeks.  Your acne gets worse.  You have a large area  of skin that is red or tender. Document Released: 03/26/2000 Document Revised: 06/21/2011 Document Reviewed: 01/15/2011 [facility] Patient Information 2014 [place], [place].   Exercise to Lose Weight Exercise and a healthy diet may help you lose weight. Your doctor may suggest specific exercises. EXERCISE IDEAS AND TIPS  Choose low-cost things you enjoy doing,  such as walking, bicycling, or exercising to workout videos.  Take stairs instead of the elevator.  Walk during your lunch break.  Park your car further away from work or school.  Go to a gym or an exercise class.  Start with 5 to 10 minutes of exercise each day. Build up to 30 minutes of exercise 4 to 6 days a week.  Wear shoes with good support and comfortable clothes.  Stretch before and after working out.  Work out until you breathe harder and your heart beats faster.  Drink extra water when you exercise.  Do not do so much that you hurt yourself, feel dizzy, or get very short of breath. Exercises that burn about 150 calories:  Running 1  miles in 15 minutes.  Playing volleyball for 45 to 60 minutes.  Washing and waxing a car for 45 to 60 minutes.  Playing touch football for 45 minutes.  Walking 1  miles in 35 minutes.  Pushing a stroller 1  miles in 30 minutes.  Playing basketball for 30 minutes.  Raking leaves for 30 minutes.  Bicycling 5 miles in 30 minutes.  Walking 2 miles in 30 minutes.  Dancing for 30 minutes.  Shoveling snow for 15 minutes.  Swimming laps for 20 minutes.  Walking up stairs for 15 minutes.  Bicycling 4 miles in 15 minutes.  Gardening for 30 to 45 minutes.  Jumping rope for 15 minutes.  Washing windows or floors for 45 to 60 minutes. Document Released: 05/01/2010 Document Revised: 06/21/2011 Document Reviewed: 05/01/2010 [facility] Patient Information 2014 [place], [place].

## 2012-12-14 NOTE — Progress Notes (Signed)
Subjective:    Patient ID: Sydney Bradley, female    DOB: 1980/10/02, 32 y.o.   MRN: [id number]  HPI A 32 year old [place] American female, morbidly obese, it is in today with concerns of weight gain. She is up 6 pounds her last office visit. Reports she is a stress eater. Exercises 2-3 times per week for about an hour. She is requesting assistance with helping to control her appetite. Denies any chest pain or palpitations.  Patient also has concerns of acne that appears to be worsening. She has tried Lubriderm, Neutrogena and proactive in the past. Has also try Retin-A and minocycline. Please minocycline helped. Retinae caused her to have sunburn.   Review of Systems  Constitutional: Positive for unexpected weight change.  Respiratory: Negative.   Cardiovascular: Negative.   Gastrointestinal: Negative.   Musculoskeletal: Negative.   Skin:       Acne to face  Allergic/Immunologic: Negative.   Neurological: Negative.   Hematological: Negative.   Psychiatric/Behavioral: Negative.    Past Medical History  Diagnosis Date  . Hypertension     no meds x 3 yrs  . Asthma   . Arthritis     L5 Back - no meds - otc prn    History   Social History  . Marital Status: Married    Spouse Name: N/A    Number of Children: N/A  . Years of Education: N/A   Occupational History  . Not on file.   Social History Main Topics  . Smoking status: Never Smoker   . Smokeless tobacco: Never Used  . Alcohol Use: No  . Drug Use: No  . Sexual Activity: Yes    Birth Control/ Protection: None     Comment: approx [redacted]wks gestation   Other Topics Concern  . Not on file   Social History Narrative  . No narrative on file    Past Surgical History  Procedure Laterality Date  . Tonsillectomy  2007  . Tonsillectomy and adenoidectomy  2007  . Dilation and evacuation  02/25/2012    Procedure: DILATATION AND EVACUATION;  Surgeon: Daria Pastures, MD;  Location: [facility];  Service: Gynecology;  Laterality:  N/A;    No family history on file.  Allergies  Allergen Reactions  . Latex Hives    Breathing problems  . 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 Penicillins Anaphylaxis  . Shellfish Allergy Anaphylaxis  . Chocolate Itching  . Ciprofloxacin Hives  . Lamisil [Terbinafine] Hives  . Wheat Bran Itching    Current Outpatient Prescriptions on File Prior to Visit  Medication Sig Dispense Refill  . albuterol (PROVENTIL HFA;VENTOLIN HFA) 108 (90 BASE) MCG/ACT inhaler Inhale 2 puffs into the lungs every 4 (four) hours as needed for wheezing.  1 Inhaler  0  . cetirizine (ZYRTEC) 10 MG tablet Take 10 mg by mouth every morning.       Marland Kitchen ibuprofen (ADVIL,MOTRIN) 200 MG tablet Take 600 mg by mouth every 8 (eight) hours as needed for pain.      . Multiple Vitamin (MULTIVITAMIN) tablet Take 1 tablet by mouth daily.       No current facility-administered medications on file prior to visit.    BP 120/86  Pulse 86  Wt 356 lb 9.6 oz (161.753 kg)  BMI 50.43 kg/m2chart    Objective:   Physical Exam  Constitutional: She is oriented to person, place, and time. She appears well-developed and well-nourished.  Neck: Normal range of  motion. Neck supple.  Cardiovascular: Normal rate, regular rhythm and normal heart sounds.   Pulmonary/Chest: Effort normal and breath sounds normal.  Abdominal: Soft. Bowel sounds are normal.  Neurological: She is alert and oriented to person, place, and time.  Skin: Skin is warm and dry.  Moderate acne to the cheeks bilaterally. Multiple stages. Evidence of scarring. Pustules noted.  Psychiatric: She has a normal mood and affect.          Assessment & Plan:  Assessment: 1. Morbidly obese 2. Acne both areas  Plan: We'll do a trial of phentermine 37.5 mg once daily. Drink plenty of water. Recheck in 4 weeks. Start minocycline 50 mg one tablet daily. eclos plant extract facial cleanser twice a day. Call the office with any  questions or concerns. Check her schedule, and as needed.

## 2013-01-01 ENCOUNTER — Encounter: Payer: Self-pay | Admitting: Family

## 2013-01-11 ENCOUNTER — Ambulatory Visit (INDEPENDENT_AMBULATORY_CARE_PROVIDER_SITE_OTHER): Payer: BC Managed Care – PPO | Admitting: Family

## 2013-01-11 ENCOUNTER — Encounter: Payer: Self-pay | Admitting: Family

## 2013-01-11 ENCOUNTER — Ambulatory Visit: Payer: BC Managed Care – PPO | Admitting: Family

## 2013-01-11 DIAGNOSIS — K5900 Constipation, unspecified: Secondary | ICD-10-CM

## 2013-01-11 MED ORDER — PHENTERMINE HCL 37.5 MG PO CAPS: 37.5000 mg | ORAL_CAPSULE | ORAL | Status: DC

## 2013-01-11 NOTE — Patient Instructions (Signed)
Constipation, Adult Constipation is when a person has fewer than 3 bowel movements a week; has difficulty having a bowel movement; or has stools that are dry, hard, or larger than normal. As people grow older, constipation is more common. If you try to fix constipation with medicines that make you have a bowel movement (laxatives), the problem may get worse. Long-term laxative use may cause the muscles of the colon to become weak. A low-fiber diet, not taking in enough fluids, and taking certain medicines may make constipation worse. CAUSES   Certain medicines, such as antidepressants, pain medicine, iron supplements, antacids, and water pills.   Certain diseases, such as diabetes, irritable bowel syndrome (IBS), thyroid disease, or depression.   Not drinking enough water.   Not eating enough fiber-rich foods.   Stress or travel.  Lack of physical activity or exercise.  Not going to the restroom when there is the urge to have a bowel movement.  Ignoring the urge to have a bowel movement.  Using laxatives too much. SYMPTOMS   Having fewer than 3 bowel movements a week.   Straining to have a bowel movement.   Having hard, dry, or larger than normal stools.   Feeling full or bloated.   Pain in the lower abdomen.  Not feeling relief after having a bowel movement. DIAGNOSIS  Your caregiver will take a medical history and perform a physical exam. Further testing may be done for severe constipation. Some tests may include:   A barium enema X-ray to examine your rectum, colon, and sometimes, your small intestine.  A sigmoidoscopy to examine your lower colon.  A colonoscopy to examine your entire colon. TREATMENT  Treatment will depend on the severity of your constipation and what is causing it. Some dietary treatments include drinking more fluids and eating more fiber-rich foods. Lifestyle treatments may include regular exercise. If these diet and lifestyle recommendations  do not help, your caregiver may recommend taking over-the-counter laxative medicines to help you have bowel movements. Prescription medicines may be prescribed if over-the-counter medicines do not work.  HOME CARE INSTRUCTIONS   Increase dietary fiber in your diet, such as fruits, vegetables, whole grains, and beans. Limit high-fat and processed sugars in your diet, such as [place] fries, hamburgers, cookies, candies, and soda.   A fiber supplement may be added to your diet if you cannot get enough fiber from foods.   Drink enough fluids to keep your urine clear or pale yellow.   Exercise regularly or as directed by your caregiver.   Go to the restroom when you have the urge to go. Do not hold it.  Only take medicines as directed by your caregiver. Do not take other medicines for constipation without talking to your caregiver first. [place] IF:   You have bright red blood in your stool.   Your constipation lasts for more than 4 days or gets worse.   You have abdominal or rectal pain.   You have thin, pencil-like stools.  You have unexplained weight loss. MAKE SURE YOU:   Understand these instructions.  Will watch your condition.  Will get help right away if you are not doing well or get worse. Document Released: 12/26/2003 Document Revised: 06/21/2011 Document Reviewed: 03/02/2011 [facility] Patient Information 2014 [place], [place].

## 2013-01-11 NOTE — Progress Notes (Signed)
Subjective:    Patient ID: Sydney Bradley, female    DOB: March 03, 1981, 32 y.o.   MRN: [id number]  HPI 32 year old [place] American female, nonsmoker is in for recheck of obesity. She's currently taking phentermine 37.5 mg once daily and tolerating it well. Reports having a more energy than usual which is a good thing for her because she is able to complete activities and exercise more. Her weight is down 14 pounds from her last office visit. She has changed her diet and eating more vegetables. She has had some constipation that is responding well to eating more vegetables.   Review of Systems  Constitutional: Negative.   HENT: Negative.   Respiratory: Negative.   Cardiovascular: Negative.   Gastrointestinal: Negative.   Genitourinary: Negative.   Musculoskeletal: Negative.   Skin: Negative.   Psychiatric/Behavioral: Negative.    Past Medical History  Diagnosis Date  . Hypertension     no meds x 3 yrs  . Asthma   . Arthritis     L5 Back - no meds - otc prn    History   Social History  . Marital Status: Married    Spouse Name: N/A    Number of Children: N/A  . Years of Education: N/A   Occupational History  . Not on file.   Social History Main Topics  . Smoking status: Never Smoker   . Smokeless tobacco: Never Used  . Alcohol Use: No  . Drug Use: No  . Sexual Activity: Yes    Birth Control/ Protection: None     Comment: approx [redacted]wks gestation   Other Topics Concern  . Not on file   Social History Narrative  . No narrative on file    Past Surgical History  Procedure Laterality Date  . Tonsillectomy  2007  . Tonsillectomy and adenoidectomy  2007  . Dilation and evacuation  02/25/2012    Procedure: DILATATION AND EVACUATION;  Surgeon: Daria Pastures, MD;  Location: [facility];  Service: Gynecology;  Laterality: N/A;    No family history on file.  Allergies  Allergen Reactions  . Latex Hives    Breathing problems  . Peanut-Containing Drug Products Anaphylaxis     Allergy to all nuts   . Penicillins Anaphylaxis  . Shellfish Allergy Anaphylaxis  . Chocolate Itching  . Ciprofloxacin Hives  . Lamisil [Terbinafine] Hives  . Wheat Bran Itching    Current Outpatient Prescriptions on File Prior to Visit  Medication Sig Dispense Refill  . albuterol (PROVENTIL HFA;VENTOLIN HFA) 108 (90 BASE) MCG/ACT inhaler Inhale 2 puffs into the lungs every 4 (four) hours as needed for wheezing.  1 Inhaler  0  . cetirizine (ZYRTEC) 10 MG tablet Take 10 mg by mouth every morning.       Marland Kitchen ibuprofen (ADVIL,MOTRIN) 200 MG tablet Take 600 mg by mouth every 8 (eight) hours as needed for pain.      Marland Kitchen levonorgestrel-ethinyl estradiol (NORDETTE) 0.15-30 MG-MCG tablet Take 1 tablet by mouth daily.  1 Package  11  . minocycline (MINOCIN) 50 MG capsule Take 2 capsules (100 mg total) by mouth 2 (two) times daily.  30 capsule  1  . Multiple Vitamin (MULTIVITAMIN) tablet Take 1 tablet by mouth daily.       No current facility-administered medications on file prior to visit.    BP 120/84  Pulse 95  Wt 342 lb (155.13 kg)  BMI 48.36 kg/m2chart    Objective:   Physical Exam  Constitutional: She is oriented to  person, place, and time. She appears well-developed and well-nourished.  Neck: Normal range of motion. Neck supple.  Cardiovascular: Normal rate, regular rhythm and normal heart sounds.   Pulmonary/Chest: Effort normal and breath sounds normal.  Musculoskeletal: Normal range of motion.  Neurological: She is alert and oriented to person, place, and time.  Skin: Skin is warm and dry.  Psychiatric: She has a normal mood and affect.          Assessment & Plan:  Assessment: 1. Obesity 2. Constipationmoment it seems that she was in  Plan: Continue phentermine 37.5 mg once a day. Continue exercise. Drink plenty of water. Recheck in 4 weeks and sooner as needed.

## 2013-01-22 ENCOUNTER — Encounter: Payer: Self-pay | Admitting: Family

## 2013-02-15 ENCOUNTER — Other Ambulatory Visit: Payer: Self-pay

## 2013-03-01 ENCOUNTER — Encounter: Payer: Self-pay | Admitting: Family

## 2013-03-01 ENCOUNTER — Ambulatory Visit (HOSPITAL_COMMUNITY)
Admission: AD | Admit: 2013-03-01 | Discharge: 2013-03-01 | Disposition: A | Discharge status: Home / Self Care | Payer: BC Managed Care – PPO | Source: Ambulatory Visit | Attending: Obstetrics and Gynecology | Admitting: Obstetrics and Gynecology

## 2013-03-01 ENCOUNTER — Encounter (HOSPITAL_COMMUNITY)
Admission: AD | Disposition: A | Discharge status: Home / Self Care | Payer: Self-pay | Source: Ambulatory Visit | Attending: Obstetrics and Gynecology

## 2013-03-01 ENCOUNTER — Encounter (HOSPITAL_COMMUNITY): Payer: BC Managed Care – PPO | Admitting: Anesthesiology

## 2013-03-01 ENCOUNTER — Encounter (HOSPITAL_COMMUNITY): Payer: Self-pay | Admitting: *Deleted

## 2013-03-01 ENCOUNTER — Ambulatory Visit (HOSPITAL_COMMUNITY)
Admission: AD | Admit: 2013-03-01 | Payer: No Typology Code available for payment source | Source: Ambulatory Visit | Admitting: Obstetrics and Gynecology

## 2013-03-01 ENCOUNTER — Other Ambulatory Visit: Payer: Self-pay | Admitting: Obstetrics and Gynecology

## 2013-03-01 ENCOUNTER — Inpatient Hospital Stay (HOSPITAL_COMMUNITY): Payer: BC Managed Care – PPO | Admitting: Anesthesiology

## 2013-03-01 DIAGNOSIS — O034 Incomplete spontaneous abortion without complication: Principal | ICD-10-CM | POA: Insufficient documentation

## 2013-03-01 DIAGNOSIS — O039 Complete or unspecified spontaneous abortion without complication: Secondary | ICD-10-CM

## 2013-03-01 HISTORY — PX: DILATION AND EVACUATION: SHX1459

## 2013-03-01 LAB — CBC
Hemoglobin: 12 g/dL (ref 12.0–15.0)
MCHC: 34 g/dL (ref 30.0–36.0)
Platelets: 262 10*3/uL (ref 150–400)

## 2013-03-01 SURGERY — DILATION AND EVACUATION, UTERUS
Anesthesia: Monitor Anesthesia Care | Site: Vagina | Wound class: Clean Contaminated

## 2013-03-01 MED ORDER — CLINDAMYCIN PHOSPHATE 900 MG/50ML IV SOLN
900.0000 mg | INTRAVENOUS | Status: AC
  Administered 2013-03-01: 900 mg via INTRAVENOUS
  Filled 2013-03-01: qty 50

## 2013-03-01 MED ORDER — FENTANYL CITRATE 0.05 MG/ML IJ SOLN
INTRAMUSCULAR | Status: AC
  Filled 2013-03-01: qty 5

## 2013-03-01 MED ORDER — ONDANSETRON HCL 4 MG/2ML IJ SOLN
INTRAMUSCULAR | Status: AC
  Filled 2013-03-01: qty 2

## 2013-03-01 MED ORDER — MEPERIDINE HCL 25 MG/ML IJ SOLN
6.2500 mg | INTRAMUSCULAR | Status: DC | PRN
  Administered 2013-03-01: 12.5 mg via INTRAVENOUS

## 2013-03-01 MED ORDER — MIDAZOLAM HCL 2 MG/2ML IJ SOLN
INTRAMUSCULAR | Status: DC | PRN
  Administered 2013-03-01: 2 mg via INTRAVENOUS

## 2013-03-01 MED ORDER — ONDANSETRON HCL 4 MG/2ML IJ SOLN
INTRAMUSCULAR | Status: DC | PRN
  Administered 2013-03-01: 4 mg via INTRAVENOUS

## 2013-03-01 MED ORDER — PROPOFOL 10 MG/ML IV EMUL
INTRAVENOUS | Status: DC | PRN
  Administered 2013-03-01: 20 mg via INTRAVENOUS
  Administered 2013-03-01 (×2): 40 mg via INTRAVENOUS
  Administered 2013-03-01 (×3): 50 mg via INTRAVENOUS

## 2013-03-01 MED ORDER — ALBUTEROL SULFATE HFA 108 (90 BASE) MCG/ACT IN AERS
INHALATION_SPRAY | RESPIRATORY_TRACT | Status: AC
  Filled 2013-03-01: qty 6.7

## 2013-03-01 MED ORDER — KETOROLAC TROMETHAMINE 30 MG/ML IJ SOLN
INTRAMUSCULAR | Status: DC | PRN
  Administered 2013-03-01: 30 mg via INTRAVENOUS

## 2013-03-01 MED ORDER — OXYCODONE-ACETAMINOPHEN 5-325 MG PO TABS
1.0000 | ORAL_TABLET | Freq: Once | ORAL | Status: AC
  Administered 2013-03-01: 1 via ORAL

## 2013-03-01 MED ORDER — FENTANYL CITRATE 0.05 MG/ML IJ SOLN
INTRAMUSCULAR | Status: DC | PRN
  Administered 2013-03-01: 25 ug via INTRAVENOUS
  Administered 2013-03-01 (×3): 50 ug via INTRAVENOUS
  Administered 2013-03-01: 25 ug via INTRAVENOUS

## 2013-03-01 MED ORDER — PROPOFOL 10 MG/ML IV EMUL
INTRAVENOUS | Status: AC
  Filled 2013-03-01: qty 20

## 2013-03-01 MED ORDER — MEPERIDINE HCL 25 MG/ML IJ SOLN
INTRAMUSCULAR | Status: AC
  Filled 2013-03-01: qty 1

## 2013-03-01 MED ORDER — FENTANYL CITRATE 0.05 MG/ML IJ SOLN
25.0000 ug | INTRAMUSCULAR | Status: DC | PRN
  Administered 2013-03-01 (×2): 25 ug via INTRAVENOUS
  Administered 2013-03-01: 50 ug via INTRAVENOUS

## 2013-03-01 MED ORDER — FENTANYL CITRATE 0.05 MG/ML IJ SOLN
INTRAMUSCULAR | Status: AC
  Filled 2013-03-01: qty 2

## 2013-03-01 MED ORDER — LIDOCAINE HCL (CARDIAC) 20 MG/ML IV SOLN
INTRAVENOUS | Status: AC
  Filled 2013-03-01: qty 5

## 2013-03-01 MED ORDER — METOCLOPRAMIDE HCL 5 MG/ML IJ SOLN: 10.0000 mg | Freq: Once | INTRAMUSCULAR | Status: DC | PRN

## 2013-03-01 MED ORDER — KETOROLAC TROMETHAMINE 30 MG/ML IJ SOLN
INTRAMUSCULAR | Status: AC
  Filled 2013-03-01: qty 1

## 2013-03-01 MED ORDER — OXYCODONE-ACETAMINOPHEN 10-325 MG PO TABS: 1.0000 | ORAL_TABLET | ORAL | Status: DC | PRN

## 2013-03-01 MED ORDER — OXYCODONE-ACETAMINOPHEN 5-325 MG PO TABS
1.0000 | ORAL_TABLET | ORAL | Status: DC | PRN
  Administered 2013-03-01: 1 via ORAL

## 2013-03-01 MED ORDER — MIDAZOLAM HCL 2 MG/2ML IJ SOLN
INTRAMUSCULAR | Status: AC
  Filled 2013-03-01: qty 2

## 2013-03-01 MED ORDER — OXYCODONE-ACETAMINOPHEN 5-325 MG PO TABS
ORAL_TABLET | ORAL | Status: AC
  Administered 2013-03-01: 1
  Filled 2013-03-01: qty 2

## 2013-03-01 MED ORDER — LIDOCAINE HCL 1 % IJ SOLN
INTRAMUSCULAR | Status: AC
  Filled 2013-03-01: qty 20

## 2013-03-01 MED ORDER — LIDOCAINE HCL (CARDIAC) 20 MG/ML IV SOLN
INTRAVENOUS | Status: DC | PRN
  Administered 2013-03-01: 30 mg via INTRAVENOUS

## 2013-03-01 MED ORDER — LACTATED RINGERS IV SOLN
INTRAVENOUS | Status: DC | PRN
  Administered 2013-03-01 (×2): via INTRAVENOUS

## 2013-03-01 MED ORDER — LIDOCAINE HCL 1 % IJ SOLN
INTRAMUSCULAR | Status: DC | PRN
  Administered 2013-03-01: 20 mL

## 2013-03-01 SURGICAL SUPPLY — 20 items
CATH ROBINSON RED A/P 16FR (CATHETERS) ×1 IMPLANT
CLOTH BEACON ORANGE TIMEOUT ST (SAFETY) ×2 IMPLANT
DECANTER SPIKE VIAL GLASS SM (MISCELLANEOUS) ×2 IMPLANT
GLOVE ECLIPSE 7.0 STRL STRAW (GLOVE) ×4 IMPLANT
GOWN PREVENTION PLUS XLARGE (GOWN DISPOSABLE) ×2 IMPLANT
GOWN STRL REIN XL XLG (GOWN DISPOSABLE) ×4 IMPLANT
KIT BERKELEY 1ST TRIMESTER 3/8 (MISCELLANEOUS) ×2 IMPLANT
NDL SPNL 22GX3.5 QUINCKE BK (NEEDLE) ×1 IMPLANT
NEEDLE SPNL 22GX3.5 QUINCKE BK (NEEDLE) ×2 IMPLANT
NS IRRIG 1000ML POUR BTL (IV SOLUTION) ×2 IMPLANT
PACK VAGINAL MINOR WOMEN LF (CUSTOM PROCEDURE TRAY) ×2 IMPLANT
PAD OB MATERNITY 4.3X12.25 (PERSONAL CARE ITEMS) ×2 IMPLANT
PAD PREP 24X48 CUFFED NSTRL (MISCELLANEOUS) ×2 IMPLANT
SET BERKELEY SUCTION TUBING (SUCTIONS) ×2 IMPLANT
SYR CONTROL 10ML LL (SYRINGE) ×2 IMPLANT
TOWEL OR 17X24 6PK STRL BLUE (TOWEL DISPOSABLE) ×4 IMPLANT
VACURETTE 10 RIGID CVD (CANNULA) IMPLANT
VACURETTE 7MM CVD STRL WRAP (CANNULA) IMPLANT
VACURETTE 8 RIGID CVD (CANNULA) IMPLANT
VACURETTE 9 RIGID CVD (CANNULA) IMPLANT

## 2013-03-01 NOTE — MAU Note (Signed)
Pt sent over from office for D&E

## 2013-03-01 NOTE — Anesthesia Postprocedure Evaluation (Signed)
  Anesthesia Post-op Note  Patient: Sydney Bradley  Procedure(s) Performed: Procedure(s): DILATATION AND EVACUATION (N/A)  Patient Location: PACU  Anesthesia Type:MAC  Level of Consciousness: awake, alert  and oriented  Airway and Oxygen Therapy: Patient Spontanous Breathing  Post-op Pain: mild  Post-op Assessment: Post-op Vital signs reviewed, Patient's Cardiovascular Status Stable, Respiratory Function Stable, Patent Airway, No signs of Nausea or vomiting and Pain level controlled  Post-op Vital Signs: Reviewed and stable  Complications: No apparent anesthesia complications

## 2013-03-01 NOTE — Anesthesia Preprocedure Evaluation (Signed)
Anesthesia Evaluation  Patient identified by MRN, date of birth, ID band Patient awake    Reviewed: Allergy & Precautions, H&P , NPO status , Patient's Chart, lab work & pertinent test results  Airway Mallampati: III TM Distance: >3 FB Neck ROM: Full    Dental no notable dental hx. (+) Teeth Intact   Pulmonary asthma ,  breath sounds clear to auscultation  Pulmonary exam normal       Cardiovascular hypertension, Rhythm:Regular Rate:Normal     Neuro/Psych negative neurological ROS  negative psych ROS   GI/Hepatic negative GI ROS, Neg liver ROS,   Endo/Other  Morbid obesity  Renal/GU negative Renal ROS  negative genitourinary   Musculoskeletal negative musculoskeletal ROS (+)   Abdominal (+) + obese,   Peds  Hematology negative hematology ROS (+)   Anesthesia Other Findings   Reproductive/Obstetrics Missed Ab-8wks                           Anesthesia Physical Anesthesia Plan  ASA: III  Anesthesia Plan: MAC   Post-op Pain Management:    Induction: Intravenous  Airway Management Planned: Natural Airway and Nasal Cannula  Additional Equipment:   Intra-op Plan:   Post-operative Plan:   Informed Consent: I have reviewed the patients History and Physical, chart, labs and discussed the procedure including the risks, benefits and alternatives for the proposed anesthesia with the patient or authorized representative who has indicated his/her understanding and acceptance.   Dental advisory given  Plan Discussed with: CRNA, Anesthesiologist and Surgeon  Anesthesia Plan Comments:         Anesthesia Quick Evaluation

## 2013-03-01 NOTE — Transfer of Care (Signed)
Immediate Anesthesia Transfer of Care Note  Patient: Sydney Bradley  Procedure(s) Performed: Procedure(s): DILATATION AND EVACUATION (N/A)  Patient Location: PACU  Anesthesia Type:MAC  Level of Consciousness: awake, alert  and oriented  Airway & Oxygen Therapy: Patient Spontanous Breathing and Patient connected to nasal cannula oxygen  Post-op Assessment: Report given to PACU RN and Post -op Vital signs reviewed and stable  Post vital signs: stable  Complications: No apparent anesthesia complications

## 2013-03-01 NOTE — H&P (Signed)
Sydney Bradley is an 32 y.o. G1P0 Unknown female.   Chief Complaint: HPI: Pt is a 32 y/o Black female G2 P0 who presents to the OR for a D&C for a SAB. Pt was found to have no FHTs on u/s today. Past Medical History  Diagnosis Date  . Hypertension     no meds x 3 yrs  . Asthma   . Arthritis     L5 Back - no meds - otc prn    Past Surgical History  Procedure Laterality Date  . Tonsillectomy  2007  . Tonsillectomy and adenoidectomy  2007  . Dilation and evacuation  02/25/2012    Procedure: DILATATION AND EVACUATION;  Surgeon: Sydney Pastures, MD;  Location: [facility];  Service: Gynecology;  Laterality: N/A;    History reviewed. No pertinent family history. Social History:  reports that she has never smoked. She has never used smokeless tobacco. She reports that she does not drink alcohol or use illicit drugs.  Allergies:  Allergies  Allergen Reactions  . Latex Hives    Breathing problems  . 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 Penicillins Anaphylaxis  . Shellfish Allergy Anaphylaxis  . Chocolate Itching  . Ciprofloxacin Hives  . Lamisil [Terbinafine] Hives  . Wheat Bran Itching    Medications Prior to Admission  Medication Sig Dispense Refill  . acetaminophen (TYLENOL) 325 MG tablet Take 650 mg by mouth every 6 (six) hours as needed for mild pain or headache.      . albuterol (PROVENTIL HFA;VENTOLIN HFA) 108 (90 BASE) MCG/ACT inhaler Inhale 2 puffs into the lungs every 4 (four) hours as needed for wheezing.  1 Inhaler  0  . cetirizine (ZYRTEC) 10 MG tablet Take 10 mg by mouth every morning.       . diphenhydrAMINE (BENADRYL) 25 MG tablet Take 25 mg by mouth 2 (two) times daily as needed for allergies.      . Prenatal Vit-Fe Fumarate-FA (PRENATAL MULTIVITAMIN) TABS tablet Take 1 tablet by mouth daily at 12 noon.      . ranitidine (ZANTAC) 150 MG tablet Take 150 mg by mouth 2 (two) times daily as needed for heartburn.         Pertinent items  are noted in HPI.  Last menstrual period 12/03/2012. General appearance: alert and cooperative Abdomen: soft, non-tender; bowel sounds normal; no masses,  no organomegaly Pelvic: cervix normal in appearance, external genitalia normal, no adnexal masses or tenderness, no cervical motion tenderness, rectovaginal septum normal, vagina normal without discharge and ut- c/w 7 weeks   Lab Results  Component Value Date   WBC 8.8 03/01/2013   HGB 12.0 03/01/2013   HCT 35.3* 03/01/2013   MCV 79.1 03/01/2013   PLT 262 03/01/2013   Lab Results  Component Value Date   PREGTESTUR  Value: NEGATIVE        THE SENSITIVITY OF THIS METHODOLOGY IS >24 mIU/mL 11/25/2009     Assessment/Plan SAB,    Proceed with D&C    Sydney Bradley 03/01/2013, 4:54 PM

## 2013-03-02 ENCOUNTER — Encounter (HOSPITAL_COMMUNITY): Payer: Self-pay | Admitting: Obstetrics and Gynecology

## 2013-03-02 NOTE — Op Note (Signed)
Sydney Bradley, LICH NO.:  [id number]  MEDICAL RECORD NO.:  [id number]  LOCATION:  [facility]                          FACILITY:  [facility]  PHYSICIAN:  Freda Munro, M.D.    DATE OF BIRTH:  [birth year]  DATE OF PROCEDURE:  03/01/2013 DATE OF DISCHARGE:  03/01/2013                              OPERATIVE REPORT   PREOPERATIVE DIAGNOSES:  Spontaneous abortion.  POSTOPERATIVE DIAGNOSIS:  Spontaneous abortion.  PROCEDURE:  Dilation evacuation.  SURGEON:  Freda Munro, M.D.  ANESTHESIA:  MAC and paracervical block.  DRAINS:  None.  ANTIBIOTICS:  Clindamycin 900 mg IV x1.  ESTIMATED BLOOD LOSS:  20 mL.  SPECIMENS:  Products of conception sent to pathology.  COMPLICATIONS:  None.  DESCRIPTION OF PROCEDURE:  The patient was taken to the operating room where she was placed in dorsal supine position.  MAC anesthesia was administered without difficulty.  She was then placed in dorsal lithotomy position.  She was prepped and draped in usual fashion for this procedure.  A sterile speculum was placed in the vagina.  A 20 mL of 1% lidocaine was used for paracervical block.  A single-tooth tenaculum was  applied to the anterior cervical lip.  The cervical os was serially dilated to a 27-French.  A 7 mm curved suction cannula was placed into the uterine cavity and products of conception withdrawn. Sharp curettage was performed followed by repeat suction.  The patient tolerated the procedure well.  She was taken to recovery room in stable condition.  Instrument and lap count was correct x2.  She will be discharged home.  She will be instructed to follow up in the office in 4 weeks.  She will be sent home with Percocet to take p.r.n.         ______________________________ Freda Munro, M.D.    MA/MEDQ  D:  03/01/2013  T:  03/02/2013  Job:  [id number]

## 2013-03-07 ENCOUNTER — Encounter: Payer: Self-pay | Admitting: Family

## 2013-04-10 ENCOUNTER — Telehealth: Payer: Self-pay | Admitting: Family

## 2013-04-10 ENCOUNTER — Emergency Department (HOSPITAL_COMMUNITY)
Admission: EM | Admit: 2013-04-10 | Discharge: 2013-04-10 | Disposition: A | Discharge status: Home / Self Care | Payer: BC Managed Care – PPO | Attending: Emergency Medicine | Admitting: Emergency Medicine

## 2013-04-10 DIAGNOSIS — J45901 Unspecified asthma with (acute) exacerbation: Secondary | ICD-10-CM | POA: Insufficient documentation

## 2013-04-10 DIAGNOSIS — Z91040 Latex allergy status: Secondary | ICD-10-CM | POA: Insufficient documentation

## 2013-04-10 DIAGNOSIS — M129 Arthropathy, unspecified: Secondary | ICD-10-CM | POA: Insufficient documentation

## 2013-04-10 DIAGNOSIS — B9789 Other viral agents as the cause of diseases classified elsewhere: Secondary | ICD-10-CM

## 2013-04-10 DIAGNOSIS — Z880 Allergy status to penicillin: Secondary | ICD-10-CM | POA: Insufficient documentation

## 2013-04-10 DIAGNOSIS — I10 Essential (primary) hypertension: Secondary | ICD-10-CM | POA: Insufficient documentation

## 2013-04-10 DIAGNOSIS — Z79899 Other long term (current) drug therapy: Secondary | ICD-10-CM | POA: Insufficient documentation

## 2013-04-10 DIAGNOSIS — J45909 Unspecified asthma, uncomplicated: Secondary | ICD-10-CM

## 2013-04-10 DIAGNOSIS — J069 Acute upper respiratory infection, unspecified: Secondary | ICD-10-CM | POA: Insufficient documentation

## 2013-04-10 MED ORDER — HYDROCOD POLST-CHLORPHEN POLST 10-8 MG/5ML PO LQCR
5.0000 mL | Freq: Once | ORAL | Status: AC
  Administered 2013-04-10: 5 mL via ORAL
  Filled 2013-04-10: qty 5

## 2013-04-10 MED ORDER — PREDNISONE (PAK) 10 MG PO TABS: ORAL_TABLET | Freq: Every day | ORAL | Status: DC

## 2013-04-10 MED ORDER — PREDNISONE 20 MG PO TABS
60.0000 mg | ORAL_TABLET | Freq: Once | ORAL | Status: AC
  Administered 2013-04-10: 60 mg via ORAL
  Filled 2013-04-10: qty 3

## 2013-04-10 MED ORDER — ALBUTEROL SULFATE (2.5 MG/3ML) 0.083% IN NEBU
5.0000 mg | INHALATION_SOLUTION | Freq: Once | RESPIRATORY_TRACT | Status: AC
  Administered 2013-04-10: 5 mg via RESPIRATORY_TRACT
  Filled 2013-04-10: qty 6

## 2013-04-10 MED ORDER — HYDROCOD POLST-CHLORPHEN POLST 10-8 MG/5ML PO LQCR: 5.0000 mL | Freq: Two times a day (BID) | ORAL | Status: DC | PRN

## 2013-04-10 NOTE — ED Provider Notes (Signed)
CSN: [id number]     Arrival date & time 04/10/13  1726 History  This chart was scribed for non-physician practitioner, Clayton Bibles, PA-C,working with Carmin Muskrat, MD, by Marlowe Kays, ED Scribe.  This patient was seen in room WTR5/WTR5 and the patient's care was started at 6:53 PM.  Chief Complaint  Patient presents with  . URI   The history is provided by the patient. No language interpreter was used.   HPI Comments:  Sydney Bradley is a 32 y.o. female who presents to the Emergency Department complaining of worsening URI symptoms for the past four days. Pt states she has generalized body aches, chills, diaphoresis, productive cough of yellow phlegm, and congestion. She states she has taken NyQuil and Robitussin with no relief. She states she has been using her albuterol MDI with only mild relief of her asthma symptoms. She denies SOB, fever, abdominal pain, CP, diarrhea, or vomiting.   Past Medical History  Diagnosis Date  . Hypertension     no meds x 3 yrs  . Asthma   . Arthritis     L5 Back - no meds - otc prn   Past Surgical History  Procedure Laterality Date  . Tonsillectomy  2007  . Tonsillectomy and adenoidectomy  2007  . Dilation and evacuation  02/25/2012    Procedure: DILATATION AND EVACUATION;  Surgeon: Daria Pastures, MD;  Location: [facility];  Service: Gynecology;  Laterality: N/A;  . Dilation and evacuation N/A 03/01/2013    Procedure: DILATATION AND EVACUATION;  Surgeon: Olga Millers, MD;  Location: [facility];  Service: Gynecology;  Laterality: N/A;   No family history on file. History  Substance Use Topics  . Smoking status: Never Smoker   . Smokeless tobacco: Never Used  . Alcohol Use: No   OB History   Grav Para Term Preterm Abortions TAB SAB Ect Mult Living   1              Review of Systems  Constitutional: Positive for chills and diaphoresis. Negative for fever.       Generalized body aches.  HENT: Positive for drooling and sore throat.    Respiratory: Positive for cough. Negative for shortness of breath.   Cardiovascular: Negative for chest pain.  Gastrointestinal: Negative for vomiting, abdominal pain and diarrhea.    Allergies  Latex; Peanut-containing drug products; Penicillins; Shellfish allergy; Chocolate; Ciprofloxacin; Lamisil; and Wheat bran  Home Medications   Current Outpatient Rx  Name  Route  Sig  Dispense  Refill  . acetaminophen (TYLENOL) 325 MG tablet   Oral   Take 650 mg by mouth every 6 (six) hours as needed for mild pain or headache.         . albuterol (PROVENTIL HFA;VENTOLIN HFA) 108 (90 BASE) MCG/ACT inhaler   Inhalation   Inhale 2 puffs into the lungs every 4 (four) hours as needed for wheezing.   1 Inhaler   0   . cetirizine (ZYRTEC) 10 MG tablet   Oral   Take 10 mg by mouth every morning.          . diphenhydrAMINE (BENADRYL) 25 MG tablet   Oral   Take 25 mg by mouth 2 (two) times daily as needed for allergies.         Marland Kitchen oxyCODONE-acetaminophen (PERCOCET) 10-325 MG per tablet   Oral   Take 1 tablet by mouth every 4 (four) hours as needed for pain.   30 tablet   0   .  Prenatal Vit-Fe Fumarate-FA (PRENATAL MULTIVITAMIN) TABS tablet   Oral   Take 1 tablet by mouth daily at 12 noon.         . ranitidine (ZANTAC) 150 MG tablet   Oral   Take 150 mg by mouth 2 (two) times daily as needed for heartburn.          Triage Vitals: BP 125/88  Pulse 98  Temp(Src) 98.5 F (36.9 C) (Oral)  SpO2 98% Physical Exam  Nursing note and vitals reviewed. Constitutional: She appears well-developed and well-nourished. No distress.  HENT:  Head: Normocephalic and atraumatic.  Nose: Rhinorrhea present.  Mouth/Throat: Uvula is midline, oropharynx is clear and moist and mucous membranes are normal.  Neck: Neck supple.  Cardiovascular: Normal rate, regular rhythm and normal heart sounds.   Pulmonary/Chest: Effort normal and breath sounds normal. No respiratory distress. She has no  wheezes. She has no rales.  Abdominal: Soft. She exhibits no distension. There is no tenderness. There is no rebound and no guarding.  Neurological: She is alert.  Skin: She is not diaphoretic.    ED Course  Procedures (including critical care time) DIAGNOSTIC STUDIES: Oxygen Saturation is 98% on RA, normal by my interpretation.   COORDINATION OF CARE: 6:56 PM- Will give prescribe prednisone, Tussionex, and a breathing treatment prior to discharge. Pt verbalizes understanding and agrees to plan.  Medications  albuterol (PROVENTIL) (2.5 MG/3ML) 0.083% nebulizer solution 5 mg (not administered)  predniSONE (DELTASONE) tablet 60 mg (not administered)  chlorpheniramine-HYDROcodone (TUSSIONEX) 10-8 MG/5ML suspension 5 mL (not administered)    Labs Review Labs Reviewed - No data to display Imaging Review No results found.  EKG Interpretation   None       MDM   1. Viral respiratory illness   2. Asthma    Pt with several days of viral respiratory illness exacerbating her asthma.  No wheezing or rales on exam but pt subjectively SOB.  Given neb treatment in ED.  D/C home with tussionex, prednisone.  PCP follow up.  Discussed  findings, treatment, and follow up  with patient.  Pt given return precautions.  Pt verbalizes understanding and agrees with plan.       I personally performed the services described in this documentation, which was scribed in my presence. The recorded information has been reviewed and is accurate.    Clayton Bibles, PA-C 04/10/13 2003

## 2013-04-10 NOTE — Telephone Encounter (Signed)
FYI

## 2013-04-10 NOTE — ED Notes (Signed)
Pt states that she thinks she has the flu but has been having trouble with her asthma and has not been able to get relief with her inhaler. States that her fingers were blue earlier. No acute distress at this time. O2 98% RA.

## 2013-04-10 NOTE — Telephone Encounter (Signed)
Patient Information:  Caller Name: Emerie  Phone: [phone]  Patient: Sydney Bradley, Sydney Bradley  Gender: Female  DOB: 1981/01/07  Age: 32 Years  PCP: [facility])  Pregnant: No  Office Follow Up:  Does the office need to follow up with this patient?: No  Instructions For The Office: N/A  RN Note:  Afebrile. Second miscarriage this year, LMP was in August 2014 and due anyday. She states her breathing is short of breath and inhaler and other OTC medications not helping. She states her fingers are blue even after her inhaler. She is concerned since she has a hx. of Pneumonia. Her ribs and chest hurts from coughing and slight sore throat. Due to allergies she did not get the Flu vaccine. RN/CAN spoke to "Barnetta Chapel" at the office who states they are at 100% capacity and advise going to the Urgent Cares or ER. RN/CAN advised Kawthar to go to one of them, not drive, she will call her Mother in [place], and gave home care advice too.  Symptoms  Reason For Call & Symptoms: Chills, body aches, nasal congestion, afebrile, hot and cold, headaches and shortness or breath. She is coughing up thick creamy colored mucus.  Reviewed Health History In EMR: Yes  Reviewed Medications In EMR: Yes  Reviewed Allergies In EMR: Yes  Reviewed Surgeries / Procedures: Yes  Date of Onset of Symptoms: 04/07/2013  Treatments Tried: Takes zyrtec q day and Benadryl prn and inhaler: Ventolin prn, Nyquil, Robitussin and Ibuprofen.  Treatments Tried Worked: No OB / GYN:  LMP: 11/10/2012  Guideline(s) Used:  Influenza - Seasonal  Disposition Per Guideline:   Go to ED Now (or to Office with PCP Approval)  Reason For Disposition Reached:   Difficulty breathing that is not severe and not relieved by cleaning out the nose  Advice Given:  N/A  Patient Will Follow Care Advice:  YES

## 2013-04-10 NOTE — ED Provider Notes (Addendum)
Medical screening examination/treatment/procedure(s) were performed by non-physician practitioner and as supervising physician I was immediately available for consultation/collaboration.  EKG Interpretation   None         Carmin Muskrat, MD 04/10/13 2123  Carmin Muskrat, MD 04/13/13 [phone]

## 2013-04-13 ENCOUNTER — Other Ambulatory Visit: Payer: Self-pay | Admitting: Family

## 2013-04-23 ENCOUNTER — Encounter: Payer: Self-pay | Admitting: Family

## 2013-04-23 ENCOUNTER — Ambulatory Visit (INDEPENDENT_AMBULATORY_CARE_PROVIDER_SITE_OTHER): Payer: BC Managed Care – PPO | Admitting: Family

## 2013-04-23 DIAGNOSIS — J019 Acute sinusitis, unspecified: Secondary | ICD-10-CM

## 2013-04-23 MED ORDER — AZITHROMYCIN 250 MG PO TABS: ORAL_TABLET | ORAL | Status: DC

## 2013-04-23 MED ORDER — FLUTICASONE PROPIONATE 50 MCG/ACT NA SUSP: 2.0000 | Freq: Every day | NASAL | Status: DC

## 2013-04-23 MED ORDER — PHENTERMINE HCL 37.5 MG PO CAPS: 37.5000 mg | ORAL_CAPSULE | ORAL | Status: DC

## 2013-04-23 NOTE — Patient Instructions (Signed)

## 2013-04-23 NOTE — Progress Notes (Signed)
Subjective:    Patient ID: Sydney Bradley, female    DOB: 03/27/1981, 33 y.o.   MRN: [id number]  HPI  33 year old [place] female, nonsmoker presenting for follow-up appointment for obesity.  Has been out of Phentamine x 1 week.  Currently weighs 335.  Has lost 15 Ibs.  She is tolerating the medication well. Is not currently exercising. Is actively on birth control.   Patient is also complaining having green phlegm and nasal drainage.  Symptoms present since December 30th.  She was ordered prednisone, and Tussines by ER but symptoms are still present.    Review of Systems  Constitutional: Negative.        Weight at 335  HENT: Positive for postnasal drip.        Green phlegm and nasal drainage  Eyes: Negative.   Respiratory: Positive for cough. Negative for shortness of breath and wheezing.   Cardiovascular: Negative.   Gastrointestinal: Negative.   Genitourinary: Negative.   Musculoskeletal: Negative.   Skin: Negative.   Allergic/Immunologic: Negative.   Neurological: Negative.   Hematological: Negative.   Psychiatric/Behavioral: Negative.    Past Medical History  Diagnosis Date  . Hypertension     no meds x 3 yrs  . Asthma   . Arthritis     L5 Back - no meds - otc prn    History   Social History  . Marital Status: Married    Spouse Name: N/A    Number of Children: N/A  . Years of Education: N/A   Occupational History  . Not on file.   Social History Main Topics  . Smoking status: Never Smoker   . Smokeless tobacco: Never Used  . Alcohol Use: No  . Drug Use: No  . Sexual Activity: Yes    Birth Control/ Protection: None     Comment: approx [redacted]wks gestation   Other Topics Concern  . Not on file   Social History Narrative  . No narrative on file    Past Surgical History  Procedure Laterality Date  . Tonsillectomy  2007  . Tonsillectomy and adenoidectomy  2007  . Dilation and evacuation  02/25/2012    Procedure: DILATATION AND EVACUATION;  Surgeon: Daria Pastures, MD;  Location: [facility];  Service: Gynecology;  Laterality: N/A;  . Dilation and evacuation N/A 03/01/2013    Procedure: DILATATION AND EVACUATION;  Surgeon: Olga Millers, MD;  Location: [facility];  Service: Gynecology;  Laterality: N/A;    No family history on file.  Allergies  Allergen Reactions  . Latex Hives    Breathing problems  . 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 Penicillins Anaphylaxis  . Shellfish Allergy Anaphylaxis  . Chocolate Itching  . Ciprofloxacin Hives  . Lamisil [Terbinafine] Hives  . Wheat Bran Itching    Current Outpatient Prescriptions on File Prior to Visit  Medication Sig Dispense Refill  . acetaminophen (TYLENOL) 325 MG tablet Take 650 mg by mouth every 6 (six) hours as needed for mild pain or headache.      . albuterol (PROVENTIL HFA;VENTOLIN HFA) 108 (90 BASE) MCG/ACT inhaler Inhale 2 puffs into the lungs every 4 (four) hours as needed for wheezing.  1 Inhaler  0  . cetirizine (ZYRTEC) 10 MG tablet Take 10 mg by mouth every morning.       . diphenhydrAMINE (BENADRYL) 25 MG tablet Take 25 mg by mouth 2 (two) times daily as needed for allergies.      Marland Kitchen  Prenatal Vit-Fe Fumarate-FA (PRENATAL MULTIVITAMIN) TABS tablet Take 1 tablet by mouth daily at 12 noon.      . ranitidine (ZANTAC) 150 MG tablet Take 150 mg by mouth 2 (two) times daily as needed for heartburn.       No current facility-administered medications on file prior to visit.    BP 116/76  Pulse 103  Wt 335 lb 1.6 oz (152 kg)chart    Objective:   Physical Exam  Constitutional: She is oriented to person, place, and time. She appears well-developed and well-nourished.  HENT:  Right Ear: External ear normal.  Left Ear: External ear normal.  Nose: Nose normal.  Mouth/Throat: Oropharynx is clear and moist.  Green phlegm and nasal drainage  Neck: Normal range of motion. Neck supple.  Cardiovascular: Normal rate and regular rhythm.   Pulmonary/Chest:  Effort normal and breath sounds normal.  Neurological: She is alert and oriented to person, place, and time.  Skin: Skin is warm and dry.  Psychiatric: She has a normal mood and affect.          Assessment & Plan:  Assessment 1. Obesity 2. Sinusitis-Acute  Plan 1. Phentermine 37.5 mg daily. Encouraged a healthy diet and exercise. Recheck in 1 month.  2. Azithromycin 542m  x1 day, then 2514mx 4days.  3. Flonase 2 sprays per each nostirl. 4. Call office if symptoms persist or worsen.

## 2013-05-01 ENCOUNTER — Telehealth: Payer: Self-pay | Admitting: Family

## 2013-05-01 NOTE — Telephone Encounter (Signed)
Pt states she is allergic to penicillin and it's derivatives.  Due to that pt states she cannot take azithromycin (ZITHROMAX) 250 MG tablet and is requesting something else to be called in to [facility].

## 2013-05-01 NOTE — Telephone Encounter (Signed)
Left detailed message on personally identified voicemail to advise pt of note

## 2013-05-01 NOTE — Telephone Encounter (Signed)
This is not a PCN. Azithromycin is ok.

## 2013-05-03 ENCOUNTER — Telehealth: Payer: Self-pay | Admitting: Family

## 2013-05-03 ENCOUNTER — Encounter: Payer: Self-pay | Admitting: Family Medicine

## 2013-05-03 ENCOUNTER — Ambulatory Visit (INDEPENDENT_AMBULATORY_CARE_PROVIDER_SITE_OTHER): Payer: BC Managed Care – PPO | Admitting: Family Medicine

## 2013-05-03 VITALS — BP 110/80 | Temp 98.2°F | Wt 324.0 lb

## 2013-05-03 DIAGNOSIS — J040 Acute laryngitis: Secondary | ICD-10-CM

## 2013-05-03 NOTE — Patient Instructions (Signed)
INSTRUCTIONS FOR UPPER RESPIRATORY INFECTION:  -plenty of rest and fluids  -complete the antibiotic  -nasal saline wash 2-3 times daily (use prepackaged nasal saline or bottled/distilled water if making your own)   -can use sinex nasal spray for drainage and nasal congestion - but do NOT use longer then 3-4 days  -can use tylenol or ibuprofen as directed for aches and sorethroat  -in the winter time, using a humidifier at night is helpful (please follow cleaning instructions)  -if you are taking a cough medication - use only as directed, may also try a teaspoon of honey to coat the throat and throat lozenges  -for sore throat, salt water gargles can help  -follow up if you have fevers, facial pain, tooth pain, difficulty breathing or are worsening or not getting better in 5-7 days

## 2013-05-03 NOTE — Telephone Encounter (Signed)
Patient Information:  Caller Name: Callen  Phone: [phone]  Patient: Sydney Bradley, Sydney Bradley  Gender: Female  DOB: January 06, 1981  Age: 33 Years  PCP: [facility])  Pregnant: No  Office Follow Up:  Does the office need to follow up with this patient?: No  Instructions For The Office: N/A  RN Note:  800 mg Ibuprofen taken at 0900 with some relief of left ear and sore throat pain. Last void at 0830.  Not drinking fluids due to throat pain. Try warm clear fluids followed by honey or icy cold fluids but must continue to drink to prevent dehydration.    Symptoms  Reason For Call & Symptoms: Called for "more antibiotics or something" for Left earache and left sided sore throat.  Seen 04/23/13.  On Zpack and  using Flonase for sinusitis after office visit 04/27/13  Reviewed Health History In EMR: Yes  Reviewed Medications In EMR: Yes  Reviewed Allergies In EMR: Yes  Reviewed Surgeries / Procedures: Yes  Date of Onset of Symptoms: 05/01/2013  Treatments Tried: Zpack, Flonase, 800 mg Ibuprofen at 0900, Qtip in ear for itching.  Treatments Tried Worked: No OB / GYN:  LMP: 04/12/2013  Guideline(s) Used:  Infection on Antibiotic Follow-up Call  Disposition Per Guideline:   Go to Office Now  Reason For Disposition Reached:   Taking antibiotic > 24 hours and symptoms WORSE (Exception: Fever higher)  Advice Given:  Call Back If:  Fever lasts over 2 days on antibiotic  You become worse  Patient Will Follow Care Advice:  YES  Appointment Scheduled:  05/03/2013 14:00:00 Appointment Scheduled Provider:  Maudie Mercury (TEXT 1st, after 20 mins can call), [facility])

## 2013-05-03 NOTE — Progress Notes (Signed)
Pre visit review using our clinic review tool, if applicable. No additional management support is needed unless otherwise documented below in the visit note. 

## 2013-05-03 NOTE — Progress Notes (Signed)
Chief Complaint  Patient presents with  . Laryngitis    ear pain and sore throat from drainage     HPI:  -started: 3 days ago, had flu in December and tx for sinus infection recently and htat has improved -symptoms:nasal congestion, sore throat, L ear pain and haorseness -denies:fever, SOB, wheezing, cough, NVD, tooth pain - these have all resolved -has tried: on zpack for sinusitis and doing flonase -sick contacts/travel/risks: clients had laryngitis ROS: See pertinent positives and negatives per HPI.  Past Medical History  Diagnosis Date  . Hypertension     no meds x 3 yrs  . Asthma   . Arthritis     L5 Back - no meds - otc prn    Past Surgical History  Procedure Laterality Date  . Tonsillectomy  2007  . Tonsillectomy and adenoidectomy  2007  . Dilation and evacuation  02/25/2012    Procedure: DILATATION AND EVACUATION;  Surgeon: Daria Pastures, MD;  Location: [facility];  Service: Gynecology;  Laterality: N/A;  . Dilation and evacuation N/A 03/01/2013    Procedure: DILATATION AND EVACUATION;  Surgeon: Olga Millers, MD;  Location: [facility];  Service: Gynecology;  Laterality: N/A;    No family history on file.  History   Social History  . Marital Status: Married    Spouse Name: N/A    Number of Children: N/A  . Years of Education: N/A   Social History Main Topics  . Smoking status: Never Smoker   . Smokeless tobacco: Never Used  . Alcohol Use: No  . Drug Use: No  . Sexual Activity: Yes    Birth Control/ Protection: None     Comment: approx [redacted]wks gestation   Other Topics Concern  . None   Social History Narrative  . None    Current outpatient prescriptions:acetaminophen (TYLENOL) 325 MG tablet, Take 650 mg by mouth every 6 (six) hours as needed for mild pain or headache., Disp: , Rfl: ;  albuterol (PROVENTIL HFA;VENTOLIN HFA) 108 (90 BASE) MCG/ACT inhaler, Inhale 2 puffs into the lungs every 4 (four) hours as needed for wheezing., Disp: 1 Inhaler, Rfl: 0;   azithromycin (ZITHROMAX) 250 MG tablet, 2 tabs today, then 1 tab a day x 4 more day., Disp: 6 tablet, Rfl: 0 cetirizine (ZYRTEC) 10 MG tablet, Take 10 mg by mouth every morning. , Disp: , Rfl: ;  diphenhydrAMINE (BENADRYL) 25 MG tablet, Take 25 mg by mouth 2 (two) times daily as needed for allergies., Disp: , Rfl: ;  fluticasone (FLONASE) 50 MCG/ACT nasal spray, Place 2 sprays into both nostrils daily., Disp: 16 g, Rfl: 6;  phentermine 37.5 MG capsule, Take 1 capsule (37.5 mg total) by mouth every morning., Disp: 30 capsule, Rfl: 0 Prenatal Vit-Fe Fumarate-FA (PRENATAL MULTIVITAMIN) TABS tablet, Take 1 tablet by mouth daily at 12 noon., Disp: , Rfl: ;  ranitidine (ZANTAC) 150 MG tablet, Take 150 mg by mouth 2 (two) times daily as needed for heartburn., Disp: , Rfl:   EXAM:  Filed Vitals:   05/03/13 1406  BP: 110/80  Temp: 98.2 F (36.8 C)    Body mass index is 45.82 kg/(m^2).  GENERAL: vitals reviewed and listed above, alert, oriented, appears well hydrated and in no acute distress  HEENT: atraumatic, conjunttiva clear, no obvious abnormalities on inspection of external nose and ears, normal appearance of ear canals and TMs, clear nasal congestion, mild post oropharyngeal erythema with PND, no tonsillar edema or exudate, no sinus TTP  NECK: no  obvious masses on inspection  LUNGS: clear to auscultation bilaterally, no wheezes, rales or rhonchi, good air movement  CV: HRRR, no peripheral edema  MS: moves all extremities without noticeable abnormality  PSYCH: pleasant and cooperative, no obvious depression or anxiety  ASSESSMENT AND PLAN:  Discussed the following assessment and plan:  Laryngitis  -given HPI and exam findings today, a serious infection or illness is unlikely. We discussed potential etiologies, with VURI being most likely, and advised supportive care and monitoring with completion of her antibiotic she was taking for a sinus infection. We discussed treatment side  effects, likely course, antibiotic misuse, transmission, and signs of developing a serious illness. -of course, we advised to return or notify a doctor immediately if symptoms worsen or persist or new concerns arise.    Patient Instructions  INSTRUCTIONS FOR UPPER RESPIRATORY INFECTION:  -plenty of rest and fluids  -complete the antibiotic  -nasal saline wash 2-3 times daily (use prepackaged nasal saline or bottled/distilled water if making your own)   -can use sinex nasal spray for drainage and nasal congestion - but do NOT use longer then 3-4 days  -can use tylenol or ibuprofen as directed for aches and sorethroat  -in the winter time, using a humidifier at night is helpful (please follow cleaning instructions)  -if you are taking a cough medication - use only as directed, may also try a teaspoon of honey to coat the throat and throat lozenges  -for sore throat, salt water gargles can help  -follow up if you have fevers, facial pain, tooth pain, difficulty breathing or are worsening or not getting better in 5-7 days      Laporsha Grealish R.

## 2013-05-18 ENCOUNTER — Encounter: Payer: Self-pay | Admitting: Family

## 2013-05-18 ENCOUNTER — Other Ambulatory Visit: Payer: Self-pay | Admitting: Family

## 2013-05-18 MED ORDER — METHYLPREDNISOLONE 4 MG PO KIT: PACK | ORAL | Status: AC

## 2013-05-24 ENCOUNTER — Encounter: Payer: Self-pay | Admitting: Family

## 2013-05-24 ENCOUNTER — Ambulatory Visit (INDEPENDENT_AMBULATORY_CARE_PROVIDER_SITE_OTHER): Payer: BC Managed Care – PPO | Admitting: Family

## 2013-05-24 DIAGNOSIS — J309 Allergic rhinitis, unspecified: Secondary | ICD-10-CM

## 2013-05-24 MED ORDER — PHENTERMINE HCL 37.5 MG PO CAPS: 37.5000 mg | ORAL_CAPSULE | ORAL | Status: DC

## 2013-05-24 MED ORDER — LEVOCETIRIZINE DIHYDROCHLORIDE 5 MG PO TABS: 5.0000 mg | ORAL_TABLET | Freq: Every evening | ORAL | Status: DC

## 2013-05-24 NOTE — Progress Notes (Signed)
Pre visit review using our clinic review tool, if applicable. No additional management support is needed unless otherwise documented below in the visit note. 

## 2013-05-24 NOTE — Patient Instructions (Signed)

## 2013-05-24 NOTE — Progress Notes (Signed)
Subjective:    Patient ID: Sydney Bradley, female    DOB: 1980/09/17, 33 y.o.   MRN: [id number]  HPI  33 year old [place] American female, nonsmoker is in for recheck of obesity. She's currently on phentermine 37.5 mg once a day and is down an additional 8 pounds this month. She has a total weight loss of 23 pounds over the last 3 months. She is following a healthy diet but is not currently exercising.  Patient has a long-standing history of allergic rhinitis requiring her to rotate through antihistamine medications. She's currently on Zyrtec. Continues to have nasal congestion and cough. Denies any fever muscle aches or pain.  Review of Systems  Constitutional: Negative.        Weight decreasing  HENT: Positive for congestion, postnasal drip and rhinorrhea. Negative for sinus pressure and sneezing.        Dry mouth  Respiratory: Positive for cough.   Cardiovascular: Negative.   Gastrointestinal: Negative.   Genitourinary: Negative.   Musculoskeletal: Negative.   Skin: Negative.   Allergic/Immunologic: Negative.   Neurological: Negative.   Psychiatric/Behavioral: Negative.    Past Medical History  Diagnosis Date  . Hypertension     no meds x 3 yrs  . Asthma   . Arthritis     L5 Back - no meds - otc prn    History   Social History  . Marital Status: Married    Spouse Name: N/A    Number of Children: N/A  . Years of Education: N/A   Occupational History  . Not on file.   Social History Main Topics  . Smoking status: Never Smoker   . Smokeless tobacco: Never Used  . Alcohol Use: No  . Drug Use: No  . Sexual Activity: Yes    Birth Control/ Protection: None     Comment: approx [redacted]wks gestation   Other Topics Concern  . Not on file   Social History Narrative  . No narrative on file    Past Surgical History  Procedure Laterality Date  . Tonsillectomy  2007  . Tonsillectomy and adenoidectomy  2007  . Dilation and evacuation  02/25/2012    Procedure: DILATATION AND  EVACUATION;  Surgeon: Daria Pastures, MD;  Location: [facility];  Service: Gynecology;  Laterality: N/A;  . Dilation and evacuation N/A 03/01/2013    Procedure: DILATATION AND EVACUATION;  Surgeon: Olga Millers, MD;  Location: [facility];  Service: Gynecology;  Laterality: N/A;    No family history on file.  Allergies  Allergen Reactions  . Latex Hives    Breathing problems  . 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 Penicillins Anaphylaxis  . Shellfish Allergy Anaphylaxis  . Chocolate Itching  . Ciprofloxacin Hives  . Lamisil [Terbinafine] Hives  . Wheat Bran Itching    Current Outpatient Prescriptions on File Prior to Visit  Medication Sig Dispense Refill  . acetaminophen (TYLENOL) 325 MG tablet Take 650 mg by mouth every 6 (six) hours as needed for mild pain or headache.      . albuterol (PROVENTIL HFA;VENTOLIN HFA) 108 (90 BASE) MCG/ACT inhaler Inhale 2 puffs into the lungs every 4 (four) hours as needed for wheezing.  1 Inhaler  0  . diphenhydrAMINE (BENADRYL) 25 MG tablet Take 25 mg by mouth 2 (two) times daily as needed for allergies.      . fluticasone (FLONASE) 50 MCG/ACT nasal spray Place 2 sprays into both nostrils daily.  [place]  g  6  . methylPREDNISolone (MEDROL DOSEPAK) 4 MG tablet follow package directions  21 tablet  0  . Prenatal Vit-Fe Fumarate-FA (PRENATAL MULTIVITAMIN) TABS tablet Take 1 tablet by mouth daily at 12 noon.      . ranitidine (ZANTAC) 150 MG tablet Take 150 mg by mouth 2 (two) times daily as needed for heartburn.       No current facility-administered medications on file prior to visit.    BP 112/78  Pulse 100  Wt 327 lb 8 oz (148.553 kg)chart    Objective:   Physical Exam  Constitutional: She is oriented to person, place, and time. She appears well-developed and well-nourished.  HENT:  Right Ear: External ear normal.  Left Ear: External ear normal.  Nose: Nose normal.  Mouth/Throat: Oropharynx is clear and moist.   Neck: Normal range of motion. Neck supple.  Cardiovascular: Normal rate, regular rhythm and normal heart sounds.   Pulmonary/Chest: Effort normal and breath sounds normal.  Abdominal: Soft. Bowel sounds are normal.  Neurological: She is alert and oriented to person, place, and time.  Skin: Skin is warm and dry.  Psychiatric: She has a normal mood and affect.          Assessment & Plan:  Derrisha was seen today for follow-up.  Diagnoses and associated orders for this visit:  Morbid obesity  Allergic rhinitis  Other Orders - phentermine 37.5 MG capsule; Take 1 capsule (37.5 mg total) by mouth every morning. - levocetirizine (XYZAL) 5 MG tablet; Take 1 tablet (5 mg total) by mouth every evening.   Recheck in 4 weeks and sooner as need. D/C Zyrtec. Tru Xyzal once daily.

## 2013-06-22 ENCOUNTER — Ambulatory Visit: Payer: BC Managed Care – PPO | Admitting: Family

## 2013-06-26 ENCOUNTER — Ambulatory Visit (INDEPENDENT_AMBULATORY_CARE_PROVIDER_SITE_OTHER): Payer: BC Managed Care – PPO | Admitting: Family

## 2013-06-26 ENCOUNTER — Encounter: Payer: Self-pay | Admitting: Family

## 2013-06-26 DIAGNOSIS — E668 Other obesity: Secondary | ICD-10-CM | POA: Insufficient documentation

## 2013-06-26 MED ORDER — PHENTERMINE HCL 37.5 MG PO CAPS: 37.5000 mg | ORAL_CAPSULE | ORAL | Status: DC

## 2013-06-26 NOTE — Progress Notes (Signed)
Subjective:    Patient ID: Sydney Bradley, female    DOB: 1981-03-29, 33 y.o.   MRN: [id number]  HPI 33 year old [place] female, nonsmoker presenting today for follow-up for weight loss.  She has lost a total of 30 pounds in four months.  She is measuring servings, drinking water, and walking when the weather permits.  She is tolerating the medication well. Has dry mouth at times.     Review of Systems  Constitutional: Positive for activity change and appetite change.       She measuring servings, increased water intake, and walking  Respiratory: Negative.   Cardiovascular: Negative.   Musculoskeletal: Negative.   Psychiatric/Behavioral: Negative.    Past Medical History  Diagnosis Date  . Hypertension     no meds x 3 yrs  . Asthma   . Arthritis     L5 Back - no meds - otc prn    History   Social History  . Marital Status: Married    Spouse Name: N/A    Number of Children: N/A  . Years of Education: N/A   Occupational History  . Not on file.   Social History Main Topics  . Smoking status: Never Smoker   . Smokeless tobacco: Never Used  . Alcohol Use: No  . Drug Use: No  . Sexual Activity: Yes    Birth Control/ Protection: None     Comment: approx [redacted]wks gestation   Other Topics Concern  . Not on file   Social History Narrative  . No narrative on file    Past Surgical History  Procedure Laterality Date  . Tonsillectomy  2007  . Tonsillectomy and adenoidectomy  2007  . Dilation and evacuation  02/25/2012    Procedure: DILATATION AND EVACUATION;  Surgeon: Daria Pastures, MD;  Location: [facility];  Service: Gynecology;  Laterality: N/A;  . Dilation and evacuation N/A 03/01/2013    Procedure: DILATATION AND EVACUATION;  Surgeon: Olga Millers, MD;  Location: [facility];  Service: Gynecology;  Laterality: N/A;    No family history on file.  Allergies  Allergen Reactions  . Latex Hives    Breathing problems  . Peanut-Containing Drug Products Anaphylaxis    Allergy  to all nuts   . Penicillins Anaphylaxis  . Shellfish Allergy Anaphylaxis  . Chocolate Itching  . Ciprofloxacin Hives  . Lamisil [Terbinafine] Hives  . Wheat Bran Itching    Current Outpatient Prescriptions on File Prior to Visit  Medication Sig Dispense Refill  . acetaminophen (TYLENOL) 325 MG tablet Take 650 mg by mouth every 6 (six) hours as needed for mild pain or headache.      . albuterol (PROVENTIL HFA;VENTOLIN HFA) 108 (90 BASE) MCG/ACT inhaler Inhale 2 puffs into the lungs every 4 (four) hours as needed for wheezing.  1 Inhaler  0  . diphenhydrAMINE (BENADRYL) 25 MG tablet Take 25 mg by mouth 2 (two) times daily as needed for allergies.      . fluticasone (FLONASE) 50 MCG/ACT nasal spray Place 2 sprays into both nostrils daily.  16 g  6  . levocetirizine (XYZAL) 5 MG tablet Take 1 tablet (5 mg total) by mouth every evening.  30 tablet  3  . Prenatal Vit-Fe Fumarate-FA (PRENATAL MULTIVITAMIN) TABS tablet Take 1 tablet by mouth daily at 12 noon.      . ranitidine (ZANTAC) 150 MG tablet Take 150 mg by mouth 2 (two) times daily as needed for heartburn.  No current facility-administered medications on file prior to visit.    BP 118/80  Pulse 101  Wt 320 lb (145.151 kg)chart    Objective:   Physical Exam  Constitutional: She is oriented to person, place, and time. She appears well-developed and well-nourished.  Cardiovascular: Normal rate, regular rhythm and normal heart sounds.   Pulmonary/Chest: Effort normal and breath sounds normal.  Musculoskeletal: Normal range of motion.  Neurological: She is alert and oriented to person, place, and time.  Skin: Skin is warm and dry.  Psychiatric: She has a normal mood and affect. Her behavior is normal. Judgment and thought content normal.          Assessment & Plan:  Assessment 1. Obesity  Plan 1. Continue phentemine 37.5 mg PO daily.  2. Encourage exercise . 3. Contact office for questions or concerns. Recheck in 4  weeks.

## 2013-06-26 NOTE — Patient Instructions (Signed)

## 2013-07-09 ENCOUNTER — Encounter: Payer: Self-pay | Admitting: Family

## 2013-07-27 ENCOUNTER — Ambulatory Visit: Payer: BC Managed Care – PPO | Admitting: Family

## 2013-07-31 ENCOUNTER — Ambulatory Visit: Payer: BC Managed Care – PPO | Admitting: Family

## 2013-08-02 ENCOUNTER — Encounter: Payer: Self-pay | Admitting: Family

## 2013-08-02 ENCOUNTER — Ambulatory Visit (INDEPENDENT_AMBULATORY_CARE_PROVIDER_SITE_OTHER): Payer: BC Managed Care – PPO | Admitting: Family

## 2013-08-02 MED ORDER — PHENTERMINE HCL 37.5 MG PO CAPS: 37.5000 mg | ORAL_CAPSULE | ORAL | Status: DC

## 2013-08-02 NOTE — Progress Notes (Signed)
Pre visit review using our clinic review tool, if applicable. No additional management support is needed unless otherwise documented below in the visit note. 

## 2013-08-02 NOTE — Patient Instructions (Signed)

## 2013-08-02 NOTE — Progress Notes (Signed)
Subjective:    Patient ID: Sydney Bradley, female    DOB: September 27, 1980, 33 y.o.   MRN: [id number]  HPI 33 year old AAF, nonsmoker, is in today for a recheck of obesity. Is currently on Phentermine 37.21m once daily and tolerating it well. However she is up 2 lbs from her last OV. She reports doing Zumba 1-2/week and walking her neighborhood. She denies any issues.    Review of Systems  Constitutional: Negative.   Respiratory: Negative.   Cardiovascular: Negative.  Negative for palpitations.  Gastrointestinal: Negative.  Negative for constipation.  Genitourinary: Negative.   Skin: Negative.   Neurological: Negative.   Psychiatric/Behavioral: Negative.    Past Medical History  Diagnosis Date  . Hypertension     no meds x 3 yrs  . Asthma   . Arthritis     L5 Back - no meds - otc prn    History   Social History  . Marital Status: Married    Spouse Name: N/A    Number of Children: N/A  . Years of Education: N/A   Occupational History  . Not on file.   Social History Main Topics  . Smoking status: Never Smoker   . Smokeless tobacco: Never Used  . Alcohol Use: No  . Drug Use: No  . Sexual Activity: Yes    Birth Control/ Protection: None     Comment: approx [redacted]wks gestation   Other Topics Concern  . Not on file   Social History Narrative  . No narrative on file    Past Surgical History  Procedure Laterality Date  . Tonsillectomy  2007  . Tonsillectomy and adenoidectomy  2007  . Dilation and evacuation  02/25/2012    Procedure: DILATATION AND EVACUATION;  Surgeon: MDaria Pastures MD;  Location: [facility];  Service: Gynecology;  Laterality: N/A;  . Dilation and evacuation N/A 03/01/2013    Procedure: DILATATION AND EVACUATION;  Surgeon: MOlga Millers MD;  [facility];  Service: Gynecology;  Laterality: N/A;    No family history on file.  Allergies  Allergen Reactions  . Latex Hives    Breathing problems  . Peanut-Containing Drug Products Anaphylaxis   Allergy to all nuts   . Penicillins Anaphylaxis  . Shellfish Allergy Anaphylaxis  . Chocolate Itching  . Ciprofloxacin Hives  . Lamisil [Terbinafine] Hives  . Wheat Bran Itching    Current Outpatient Prescriptions on File Prior to Visit  Medication Sig Dispense Refill  . acetaminophen (TYLENOL) 325 MG tablet Take 650 mg by mouth every 6 (six) hours as needed for mild pain or headache.      . albuterol (PROVENTIL HFA;VENTOLIN HFA) 108 (90 BASE) MCG/ACT inhaler Inhale 2 puffs into the lungs every 4 (four) hours as needed for wheezing.  1 Inhaler  0  . diphenhydrAMINE (BENADRYL) 25 MG tablet Take 25 mg by mouth 2 (two) times daily as needed for allergies.      . fluticasone (FLONASE) 50 MCG/ACT nasal spray Place 2 sprays into both nostrils daily.  16 g  6  . levocetirizine (XYZAL) 5 MG tablet Take 1 tablet (5 mg total) by mouth every evening.  30 tablet  3  . Prenatal Vit-Fe Fumarate-FA (PRENATAL MULTIVITAMIN) TABS tablet Take 1 tablet by mouth daily at 12 noon.      . ranitidine (ZANTAC) 150 MG tablet Take 150 mg by mouth 2 (two) times daily as needed for heartburn.       No current facility-administered medications on  file prior to visit.    BP 114/74  Pulse 74  Wt 322 lb (146.058 kg)  SpO2 98%  LMP 04/22/2015chart    Objective:   Physical Exam  Constitutional: She is oriented to person, place, and time. She appears well-developed and well-nourished.  HENT:  Right Ear: External ear normal.  Left Ear: External ear normal.  Nose: Nose normal.  Mouth/Throat: Oropharynx is clear and moist.  Neck: Normal range of motion. Neck supple.  Cardiovascular: Normal rate, regular rhythm and normal heart sounds.   Pulmonary/Chest: Effort normal and breath sounds normal.  Abdominal: Soft. Bowel sounds are normal.  Musculoskeletal: Normal range of motion.  Neurological: She is alert and oriented to person, place, and time.  Skin: Skin is warm and dry.  Psychiatric: She has a normal mood and  affect.          Assessment & Plan:  Yanni was seen today for follow-up.  Diagnoses and associated orders for this visit:  Morbid obesity  Other Orders - phentermine 37.5 MG capsule; Take 1 capsule (37.5 mg total) by mouth every morning.   Hold Phentermine x 1 month and resume in June since it seems she has plateau. Recheck in 2 months.

## 2013-10-01 ENCOUNTER — Ambulatory Visit: Payer: BC Managed Care – PPO | Admitting: Family

## 2013-10-02 ENCOUNTER — Ambulatory Visit (INDEPENDENT_AMBULATORY_CARE_PROVIDER_SITE_OTHER): Payer: BC Managed Care – PPO | Admitting: Family

## 2013-10-02 ENCOUNTER — Encounter: Payer: Self-pay | Admitting: Family

## 2013-10-02 MED ORDER — PHENTERMINE HCL 37.5 MG PO CAPS: 37.5000 mg | ORAL_CAPSULE | ORAL | Status: DC

## 2013-10-02 NOTE — Patient Instructions (Signed)
Exercise to Lose Weight Exercise and a healthy diet may help you lose weight. Your doctor may suggest specific exercises. EXERCISE IDEAS AND TIPS  Choose low-cost things you enjoy doing, such as walking, bicycling, or exercising to workout videos.  Take stairs instead of the elevator.  Walk during your lunch break.  Park your car further away from work or school.  Go to a gym or an exercise class.  Start with 5 to 10 minutes of exercise each day. Build up to 30 minutes of exercise 4 to 6 days a week.  Wear shoes with good support and comfortable clothes.  Stretch before and after working out.  Work out until you breathe harder and your heart beats faster.  Drink extra water when you exercise.  Do not do so much that you hurt yourself, feel dizzy, or get very short of breath. Exercises that burn about 150 calories:  Running 1  miles in 15 minutes.  Playing volleyball for 45 to 60 minutes.  Washing and waxing a car for 45 to 60 minutes.  Playing touch football for 45 minutes.  Walking 1  miles in 35 minutes.  Pushing a stroller 1  miles in 30 minutes.  Playing basketball for 30 minutes.  Raking leaves for 30 minutes.  Bicycling 5 miles in 30 minutes.  Walking 2 miles in 30 minutes.  Dancing for 30 minutes.  Shoveling snow for 15 minutes.  Swimming laps for 20 minutes.  Walking up stairs for 15 minutes.  Bicycling 4 miles in 15 minutes.  Gardening for 30 to 45 minutes.  Jumping rope for 15 minutes.  Washing windows or floors for 45 to 60 minutes. Document Released: 05/01/2010 Document Revised: 06/21/2011 Document Reviewed: 05/01/2010 Exit[employer] Patient Information 2015 [employer]. This information is not intended to replace advice given to you by your health care Cassey Hurrell. Make sure you discuss any questions you have with your health care Jessamine Barcia.  

## 2013-10-02 NOTE — Progress Notes (Signed)
Subjective:    Patient ID: Sydney Bradley, female    DOB: 1980/10/24, 33 y.o.   MRN: [id number]  HPI 33 y.o. African American female presents today for a weight recheck. Patient has been taking phentermine to help with weight loss. Today she is down 6 pounds since being restarted on Phentermine on May 23rd. Pt states she has occasional heart palpitations, dry mouth and constipation. She states that she drinks prune juice to help with constipation and is drinking water to help with dry mouth. Her diet now consist of fruit, yogurt, subway salads. She is walking 3 days per week and hopes to start going to the pool more often. Pt denies fever, chills, fatigue, SOB and change in appetite.     Review of Systems  Constitutional: Negative.   HENT: Negative.   Eyes: Negative.   Respiratory: Negative.   Cardiovascular: Positive for palpitations.  Gastrointestinal: Positive for constipation.  Endocrine: Negative.   Skin: Negative.   Allergic/Immunologic: Negative.   Neurological: Negative.   Hematological: Negative.   Psychiatric/Behavioral: Negative.    Past Medical History  Diagnosis Date  . Hypertension     no meds x 3 yrs  . Asthma   . Arthritis     L5 Back - no meds - otc prn    History   Social History  . Marital Status: Married    Spouse Name: N/A    Number of Children: N/A  . Years of Education: N/A   Occupational History  . Not on file.   Social History Main Topics  . Smoking status: Never Smoker   . Smokeless tobacco: Never Used  . Alcohol Use: No  . Drug Use: No  . Sexual Activity: Yes    Birth Control/ Protection: None     Comment: approx [redacted]wks gestation   Other Topics Concern  . Not on file   Social History Narrative  . No narrative on file    Past Surgical History  Procedure Laterality Date  . Tonsillectomy  2007  . Tonsillectomy and adenoidectomy  2007  . Dilation and evacuation  02/25/2012    Procedure: DILATATION AND EVACUATION;  Surgeon: Daria Pastures, MD;  Location: [facility];  Service: Gynecology;  Laterality: N/A;  . Dilation and evacuation N/A 03/01/2013    Procedure: DILATATION AND EVACUATION;  Surgeon: Olga Millers, MD;  Location: [facility];  Service: Gynecology;  Laterality: N/A;    No family history on file.  Allergies  Allergen Reactions  . Latex Hives    Breathing problems  . 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 Penicillins Anaphylaxis  . Shellfish Allergy Anaphylaxis  . Chocolate Itching  . Ciprofloxacin Hives  . Lamisil [Terbinafine] Hives  . Wheat Bran Itching    Current Outpatient Prescriptions on File Prior to Visit  Medication Sig Dispense Refill  . acetaminophen (TYLENOL) 325 MG tablet Take 650 mg by mouth every 6 (six) hours as needed for mild pain or headache.      . albuterol (PROVENTIL HFA;VENTOLIN HFA) 108 (90 BASE) MCG/ACT inhaler Inhale 2 puffs into the lungs every 4 (four) hours as needed for wheezing.  1 Inhaler  0  . diphenhydrAMINE (BENADRYL) 25 MG tablet Take 25 mg by mouth 2 (two) times daily as needed for allergies.      . fluticasone (FLONASE) 50 MCG/ACT nasal spray Place 2 sprays into both nostrils daily.  16 g  6  . levocetirizine (XYZAL) 5  MG tablet Take 1 tablet (5 mg total) by mouth every evening.  30 tablet  3  . Prenatal Vit-Fe Fumarate-FA (PRENATAL MULTIVITAMIN) TABS tablet Take 1 tablet by mouth daily at 12 noon.      . ranitidine (ZANTAC) 150 MG tablet Take 150 mg by mouth 2 (two) times daily as needed for heartburn.       No current facility-administered medications on file prior to visit.    BP 122/74  Pulse 96  Wt 316 lb 12.8 oz (143.7 kg)  SpO2 99%chart    Objective:   Physical Exam  Constitutional: She is oriented to person, place, and time. She appears well-developed and well-nourished. She is active.  Cardiovascular: Normal rate, regular rhythm, normal heart sounds and normal pulses.   Pulmonary/Chest: Effort normal and breath sounds  normal.  Abdominal: Soft. Normal appearance and bowel sounds are normal.  Neurological: She is alert and oriented to person, place, and time.  Skin: Skin is warm, dry and intact.  Psychiatric: She has a normal mood and affect. Her speech is normal and behavior is normal.          Assessment & Plan:  Neil was seen today for weight check.  Diagnoses and associated orders for this visit:  Morbid obesity  Other Orders - phentermine 37.5 MG capsule; Take 1 capsule (37.5 mg total) by mouth every morning.   Education: continue to exercise, and increase exercise to 30 minutes daily. Eat healthy diet. Drink more water.   Follow up: 1 month. No more phentermine after this month.

## 2013-10-02 NOTE — Progress Notes (Signed)
Pre visit review using our clinic review tool, if applicable. No additional management support is needed unless otherwise documented below in the visit note. 

## 2013-11-01 ENCOUNTER — Ambulatory Visit (INDEPENDENT_AMBULATORY_CARE_PROVIDER_SITE_OTHER): Payer: BC Managed Care – PPO | Admitting: Family

## 2013-11-01 ENCOUNTER — Encounter: Payer: Self-pay | Admitting: Family

## 2013-11-01 DIAGNOSIS — R002 Palpitations: Secondary | ICD-10-CM

## 2013-11-01 DIAGNOSIS — R252 Cramp and spasm: Secondary | ICD-10-CM

## 2013-11-01 LAB — COMPREHENSIVE METABOLIC PANEL
ALT: 17 U/L (ref 0–35)
AST: 16 U/L (ref 0–37)
Albumin: 3.7 g/dL (ref 3.5–5.2)
Alkaline Phosphatase: 53 U/L (ref 39–117)
BILIRUBIN TOTAL: 0.6 mg/dL (ref 0.2–1.2)
BUN: 7 mg/dL (ref 6–23)
CALCIUM: 9.3 mg/dL (ref 8.4–10.5)
CO2: 24 meq/L (ref 19–32)
CREATININE: 0.7 mg/dL (ref 0.4–1.2)
Chloride: 107 mEq/L (ref 96–112)
GFR: 115.23 mL/min (ref >60.00)
GLUCOSE: 99 mg/dL (ref 70–99)
Potassium: 3.7 mEq/L (ref 3.5–5.1)
Sodium: 138 mEq/L (ref 135–145)
Total Protein: 7.4 g/dL (ref 6.0–8.3)

## 2013-11-01 MED ORDER — PHENTERMINE HCL 37.5 MG PO CAPS: 37.5000 mg | ORAL_CAPSULE | ORAL | Status: DC

## 2013-11-01 NOTE — Progress Notes (Signed)
Pre visit review using our clinic review tool, if applicable. No additional management support is needed unless otherwise documented below in the visit note. 

## 2013-11-01 NOTE — Progress Notes (Signed)
Subjective:    Patient ID: Sydney Bradley, female    DOB: 14-Aug-1980, 33 y.o.   MRN: [id number]  HPI 34 yo AA female presents for monthly weight check in the context of phentermine 37.5 mg medication. She is down 6 lbs from her last visit and is aware the medication will be discontinued today as she has been on it for 6 months.  She reports infrequent heart palpitations, which she attributes to the medication. They are no worse, nor more frequent, than in the past. She also reports intermittent foot and leg cramps throughout the last month, which are relieved by a spoonful of mustard. She has increased her daily water intake to 2-3 liters, per recomendations from last visit.  She is exercising daily as swimming and walking, approximately 1-2 hours per day, without symptoms.   Review of Systems  Constitutional: Negative.   HENT: Negative.   Eyes: Negative.   Respiratory: Negative.   Cardiovascular: Positive for palpitations.  Gastrointestinal: Negative.   Endocrine: Negative.   Genitourinary: Negative.   Musculoskeletal:       Bilateral muscle cramps, mostly at night  Skin: Negative.   Allergic/Immunologic: Positive for environmental allergies.  Neurological: Negative.   Hematological: Negative.    Past Medical History  Diagnosis Date  . Hypertension     no meds x 3 yrs  . Asthma   . Arthritis     L5 Back - no meds - otc prn    History   Social History  . Marital Status: Married    Spouse Name: N/A    Number of Children: N/A  . Years of Education: N/A   Occupational History  . Not on file.   Social History Main Topics  . Smoking status: Never Smoker   . Smokeless tobacco: Never Used  . Alcohol Use: No  . Drug Use: No  . Sexual Activity: Yes    Birth Control/ Protection: None     Comment: approx [redacted]wks gestation   Other Topics Concern  . Not on file   Social History Narrative  . No narrative on file    Past Surgical History  Procedure Laterality Date  .  Tonsillectomy  2007  . Tonsillectomy and adenoidectomy  2007  . Dilation and evacuation  02/25/2012    Procedure: DILATATION AND EVACUATION;  Surgeon: Daria Pastures, MD;  Location: [facility];  Service: Gynecology;  Laterality: N/A;  . Dilation and evacuation N/A 03/01/2013    Procedure: DILATATION AND EVACUATION;  Surgeon: Olga Millers, MD;  Location: [facility];  Service: Gynecology;  Laterality: N/A;    No family history on file.  Allergies  Allergen Reactions  . Latex Hives    Breathing problems  . 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 Penicillins Anaphylaxis  . Shellfish Allergy Anaphylaxis  . Chocolate Itching  . Ciprofloxacin Hives  . Lamisil [Terbinafine] Hives  . Wheat Bran Itching    Current Outpatient Prescriptions on File Prior to Visit  Medication Sig Dispense Refill  . acetaminophen (TYLENOL) 325 MG tablet Take 650 mg by mouth every 6 (six) hours as needed for mild pain or headache.      . albuterol (PROVENTIL HFA;VENTOLIN HFA) 108 (90 BASE) MCG/ACT inhaler Inhale 2 puffs into the lungs every 4 (four) hours as needed for wheezing.  1 Inhaler  0  . diphenhydrAMINE (BENADRYL) 25 MG tablet Take 25 mg by mouth 2 (two) times daily as needed for allergies.      Marland Kitchen  fluticasone (FLONASE) 50 MCG/ACT nasal spray Place 2 sprays into both nostrils daily.  16 g  6  . levocetirizine (XYZAL) 5 MG tablet Take 1 tablet (5 mg total) by mouth every evening.  30 tablet  3  . Prenatal Vit-Fe Fumarate-FA (PRENATAL MULTIVITAMIN) TABS tablet Take 1 tablet by mouth daily at 12 noon.      . ranitidine (ZANTAC) 150 MG tablet Take 150 mg by mouth 2 (two) times daily as needed for heartburn.       No current facility-administered medications on file prior to visit.    BP 118/78  Pulse 114  Wt 310 lb 9.6 oz (140.887 kg)chart     Objective:   Physical Exam  Constitutional: She is oriented to person, place, and time. She appears well-developed and  well-nourished.  HENT:  Head: Normocephalic and atraumatic.  Neck: Normal range of motion. Neck supple.  Cardiovascular: Regular rhythm and normal heart sounds.  Exam reveals no gallop and no friction rub.   No murmur heard. Pulmonary/Chest: Effort normal and breath sounds normal. She has no wheezes.  Musculoskeletal: Normal range of motion. She exhibits no edema and no tenderness.  Lymphadenopathy:    She has no cervical adenopathy.  Neurological: She is alert and oriented to person, place, and time.  Skin: Skin is warm and dry. She is not diaphoretic.  Psychiatric: She has a normal mood and affect. Her behavior is normal. Judgment and thought content normal.       Assessment & Plan:  Sydney Bradley was seen today for weight check.  Diagnoses and associated orders for this visit:  Morbid obesity  Bilateral leg cramps - CMP  Palpitations - CMP  Other Orders - phentermine 37.5 MG tablet; Take 1/2 tablet (37.5 mg total) by mouth every morning then stop

## 2013-11-01 NOTE — Patient Instructions (Signed)
Leg Cramps Leg cramps that occur during exercise can be caused by poor circulation or dehydration. However, muscle cramps that occur at rest or during the night are usually not due to any serious medical problem. Heat cramps may cause muscle spasms during hot weather.  CAUSES There is no clear cause for muscle cramps. However, dehydration may be a factor for those who do not drink enough fluids and those who exercise in the heat. Imbalances in the level of sodium, potassium, calcium or magnesium in the muscle tissue may also be a factor. Some medications, such as water pills (diuretics), may cause loss of chemicals that the body needs (like sodium and potassium) and cause muscle cramps. TREATMENT   Make sure your diet has enough fluids and essential minerals for the muscle to work normally.  Avoid strenuous exercise for several days if you have been having frequent leg cramps.  Stretch and massage the cramped muscle for several minutes.  Some medicines may be helpful in some patients with night cramps. Only take over-the-counter or prescription medicines as directed by your caregiver. SEEK IMMEDIATE MEDICAL CARE IF:   Your leg cramps become worse.  Your foot becomes cold, numb, or blue. Document Released: 05/06/2004 Document Revised: 06/21/2011 Document Reviewed: 04/23/2008 [facility] Patient Information 2015 [place], [place]. This information is not intended to replace advice given to you by your health care provider. Make sure you discuss any questions you have with your health care provider.

## 2013-11-15 ENCOUNTER — Encounter: Payer: Self-pay | Admitting: Family

## 2013-11-15 ENCOUNTER — Other Ambulatory Visit: Payer: Self-pay | Admitting: Family

## 2013-11-15 MED ORDER — AZITHROMYCIN 250 MG PO TABS: 250.0000 mg | ORAL_TABLET | Freq: Every day | ORAL | Status: DC

## 2013-11-19 ENCOUNTER — Telehealth: Payer: Self-pay | Admitting: Family

## 2013-11-19 ENCOUNTER — Encounter: Payer: Self-pay | Admitting: Family

## 2013-11-19 NOTE — Telephone Encounter (Signed)
You can offer the 2pm on Friday and block the 4 for make up

## 2013-11-19 NOTE — Telephone Encounter (Signed)
Pt req appt this wk for post er fup. 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 Please advise

## 2013-11-19 NOTE — Telephone Encounter (Signed)
Pt has been sch

## 2013-11-23 ENCOUNTER — Ambulatory Visit: Payer: BC Managed Care – PPO | Admitting: Family

## 2013-11-23 DIAGNOSIS — Z0289 Encounter for other administrative examinations: Secondary | ICD-10-CM

## 2013-12-24 ENCOUNTER — Encounter: Payer: Self-pay | Admitting: Family

## 2014-01-22 ENCOUNTER — Ambulatory Visit: Payer: BC Managed Care – PPO | Admitting: Family

## 2014-01-25 ENCOUNTER — Other Ambulatory Visit: Payer: Self-pay

## 2014-02-11 ENCOUNTER — Encounter: Payer: Self-pay | Admitting: Family

## 2014-03-14 ENCOUNTER — Other Ambulatory Visit: Payer: Self-pay | Admitting: Obstetrics and Gynecology

## 2014-03-18 LAB — CYTOLOGY - PAP

## 2014-03-22 ENCOUNTER — Encounter (HOSPITAL_COMMUNITY): Payer: Self-pay | Admitting: *Deleted

## 2014-03-22 ENCOUNTER — Inpatient Hospital Stay (HOSPITAL_COMMUNITY): Payer: 59

## 2014-03-22 ENCOUNTER — Inpatient Hospital Stay (HOSPITAL_COMMUNITY)
Admission: AD | Admit: 2014-03-22 | Discharge: 2014-03-22 | Disposition: A | Discharge status: Home / Self Care | Payer: 59 | Source: Ambulatory Visit | Attending: Obstetrics and Gynecology | Admitting: Obstetrics and Gynecology

## 2014-03-22 DIAGNOSIS — O43891 Other placental disorders, first trimester: Secondary | ICD-10-CM

## 2014-03-22 DIAGNOSIS — O10011 Pre-existing essential hypertension complicating pregnancy, first trimester: Secondary | ICD-10-CM | POA: Insufficient documentation

## 2014-03-22 DIAGNOSIS — O26851 Spotting complicating pregnancy, first trimester: Secondary | ICD-10-CM | POA: Insufficient documentation

## 2014-03-22 DIAGNOSIS — J45909 Unspecified asthma, uncomplicated: Secondary | ICD-10-CM | POA: Insufficient documentation

## 2014-03-22 DIAGNOSIS — O99611 Diseases of the digestive system complicating pregnancy, first trimester: Secondary | ICD-10-CM | POA: Diagnosis not present

## 2014-03-22 DIAGNOSIS — K589 Irritable bowel syndrome without diarrhea: Secondary | ICD-10-CM | POA: Insufficient documentation

## 2014-03-22 DIAGNOSIS — O468X1 Other antepartum hemorrhage, first trimester: Secondary | ICD-10-CM

## 2014-03-22 DIAGNOSIS — O99511 Diseases of the respiratory system complicating pregnancy, first trimester: Secondary | ICD-10-CM | POA: Insufficient documentation

## 2014-03-22 DIAGNOSIS — Z3A01 Less than 8 weeks gestation of pregnancy: Secondary | ICD-10-CM | POA: Insufficient documentation

## 2014-03-22 DIAGNOSIS — O418X10 Other specified disorders of amniotic fluid and membranes, first trimester, not applicable or unspecified: Secondary | ICD-10-CM

## 2014-03-22 DIAGNOSIS — O368910 Maternal care for other specified fetal problems, first trimester, not applicable or unspecified: Secondary | ICD-10-CM | POA: Diagnosis not present

## 2014-03-22 DIAGNOSIS — N898 Other specified noninflammatory disorders of vagina: Secondary | ICD-10-CM | POA: Diagnosis present

## 2014-03-22 HISTORY — DX: Irritable bowel syndrome, unspecified: K58.9

## 2014-03-22 LAB — URINALYSIS, ROUTINE W REFLEX MICROSCOPIC
Bilirubin Urine: NEGATIVE
Glucose, UA: NEGATIVE mg/dL
KETONES UR: NEGATIVE mg/dL
Leukocytes, UA: NEGATIVE
NITRITE: NEGATIVE
PH: 8 (ref 5.0–8.0)
Protein, ur: NEGATIVE mg/dL
SPECIFIC GRAVITY, URINE: 1.015 (ref 1.005–1.030)
UROBILINOGEN UA: 0.2 mg/dL (ref 0.0–1.0)

## 2014-03-22 LAB — URINE MICROSCOPIC-ADD ON

## 2014-03-22 LAB — WET PREP, GENITAL
Clue Cells Wet Prep HPF POC: NONE SEEN
Trich, Wet Prep: NONE SEEN
Yeast Wet Prep HPF POC: NONE SEEN

## 2014-03-22 LAB — POCT PREGNANCY, URINE: Preg Test, Ur: POSITIVE — AB

## 2014-03-22 NOTE — MAU Provider Note (Signed)
History     CSN: [id number]  Arrival date and time: 03/22/14 1652   First Provider Initiated Contact with Patient 03/22/14 1844      Chief Complaint  Patient presents with  . Vaginal Bleeding   HPI  Sydney Bradley is a 33 y.o. G4P0020 at 78w5dwho presents today with brown discharge. She states that she had some brown spotting over Thanksgiving that went away on its own. Then yesterday and today the brown spotting returned. She has had some cramping, but it is the same as it has been for a while now. She had two [facility]. One in the office and one at the fertility clinic. She states that they were normal, and the FHR at the [facility] the office was 130s.   Past Medical History  Diagnosis Date  . Hypertension     no meds x 3 yrs  . Asthma   . Arthritis     L5 Back - no meds - otc prn  . IBS (irritable bowel syndrome)     Past Surgical History  Procedure Laterality Date  . Tonsillectomy  2007  . Tonsillectomy and adenoidectomy  2007  . Dilation and evacuation  02/25/2012    Procedure: DILATATION AND EVACUATION;  Surgeon: MDaria Pastures MD;  [facility];  Service: Gynecology;  Laterality: N/A;  . Dilation and evacuation N/A 03/01/2013    Procedure: DILATATION AND EVACUATION;  Surgeon: MOlga Millers MD;  [facility];  Service: Gynecology;  Laterality: N/A;    History reviewed. No pertinent family history.  History  Substance Use Topics  . Smoking status: Never Smoker   . Smokeless tobacco: Never Used  . Alcohol Use: No    Allergies:  Allergies  Allergen Reactions  . Latex Hives    Breathing problems  . 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 Penicillins Anaphylaxis  . Shellfish Allergy Anaphylaxis  . Chocolate Itching  . Ciprofloxacin Hives  . Lamisil [Terbinafine] Hives  . Wheat Bran Itching    Prescriptions prior to admission  Medication Sig Dispense Refill Last Dose  . acetaminophen (TYLENOL) 325 MG tablet  Take 650 mg by mouth every 6 (six) hours as needed for mild pain or headache.   03/21/2014 at Unknown time  . albuterol (PROVENTIL HFA;VENTOLIN HFA) 108 (90 BASE) MCG/ACT inhaler Inhale 2 puffs into the lungs every 4 (four) hours as needed for wheezing. 1 Inhaler 0 Past Week at Unknown time  . aspirin 81 MG tablet Take 81 mg by mouth at bedtime.   03/21/2014 at Unknown time  . Cyanocobalamin (VITAMIN B-12 PO) Take 1 tablet by mouth daily.   03/22/2014 at Unknown time  . diphenhydrAMINE (BENADRYL) 25 MG tablet Take 25-50 mg by mouth 2 (two) times daily as needed for itching or allergies. Patient takes 1 tablet in the morning and 2 at night.   03/22/2014 at Unknown time  . Prenatal Vit-Fe Fumarate-FA (PRENATAL MULTIVITAMIN) TABS tablet Take 1 tablet by mouth daily at 12 noon.   03/22/2014 at Unknown time  . progesterone (PROMETRIUM) 200 MG capsule Take 200 mg by mouth at bedtime.   03/21/2014 at Unknown time  . ranitidine (ZANTAC) 150 MG tablet Take 150 mg by mouth daily.    03/22/2014 at Unknown time  . azithromycin (ZITHROMAX Z-PAK) 250 MG tablet Take 1 tablet (250 mg total) by mouth daily. As directed (Patient not taking: Reported on 03/22/2014) 6 tablet 0 Completed Course  at Unknown time  . fluticasone (FLONASE) 50 MCG/ACT nasal spray Place 2 sprays into both nostrils daily. (Patient not taking: Reported on 03/22/2014) 16 g 6 Not Taking at Unknown time  . levocetirizine (XYZAL) 5 MG tablet Take 1 tablet (5 mg total) by mouth every evening. (Patient not taking: Reported on 03/22/2014) 30 tablet 3 Not Taking at Unknown time  . phentermine 37.5 MG capsule Take 1 capsule (37.5 mg total) by mouth every morning. (Patient not taking: Reported on 03/22/2014) 5 capsule 0 Not Taking at Unknown time    ROS Physical Exam   Blood pressure 103/83, pulse 84, temperature 98.1 F (36.7 C), temperature source Oral, resp. rate 20, height 5' 9.5" (1.765 m), weight 152.409 kg (336 lb), last menstrual period  01/21/2014.  Physical Exam  Nursing note and vitals reviewed. Constitutional: She is oriented to person, place, and time. She appears well-developed and well-nourished. No distress.  Cardiovascular: Normal rate.   Respiratory: Effort normal.  GI: Soft. There is no tenderness. There is no rebound.  Genitourinary:   External: no lesion Vagina: small amount of brown blood  Cervix: pink, smooth, no CMT Uterus: NSSC Adnexa: NT   Neurological: She is alert and oriented to person, place, and time.  Skin: Skin is warm and dry.  Psychiatric: She has a normal mood and affect.    MAU Course  Procedures  Results for orders placed or performed during the hospital encounter of 03/22/14 (from the past 24 hour(s))  Urinalysis, Routine w reflex microscopic     Status: Abnormal   Collection Time: 03/22/14  5:44 PM  Result Value Ref Range   Color, Urine YELLOW YELLOW   APPearance CLEAR CLEAR   Specific Gravity, Urine 1.015 1.005 - 1.030   pH 8.0 5.0 - 8.0   Glucose, UA NEGATIVE NEGATIVE mg/dL   Hgb urine dipstick MODERATE (A) NEGATIVE   Bilirubin Urine NEGATIVE NEGATIVE   Ketones, ur NEGATIVE NEGATIVE mg/dL   Protein, ur NEGATIVE NEGATIVE mg/dL   Urobilinogen, UA 0.2 0.0 - 1.0 mg/dL   Nitrite NEGATIVE NEGATIVE   Leukocytes, UA NEGATIVE NEGATIVE  Urine microscopic-add on     Status: Abnormal   Collection Time: 03/22/14  5:44 PM  Result Value Ref Range   Squamous Epithelial / LPF FEW (A) RARE   WBC, UA 0-2 <3 WBC/hpf   RBC / HPF 0-2 <3 RBC/hpf   Bacteria, UA FEW (A) RARE  Pregnancy, urine POC     Status: Abnormal   Collection Time: 03/22/14  5:51 PM  Result Value Ref Range   Preg Test, Ur POSITIVE (A) NEGATIVE  Wet prep, genital     Status: Abnormal   Collection Time: 03/22/14  6:54 PM  Result Value Ref Range   Yeast Wet Prep HPF POC NONE SEEN NONE SEEN   Trich, Wet Prep NONE SEEN NONE SEEN   Clue Cells Wet Prep HPF POC NONE SEEN NONE SEEN   WBC, Wet Prep HPF POC MODERATE (A) NONE  SEEN   US Ob Comp Less 14 Wks  03/22/2014   CLINICAL DATA:  Intermittent spotting.  Early pregnancy.  EXAM: OBSTETRIC <14 WK ULTRASOUND  TECHNIQUE: Transabdominal ultrasound was performed for evaluation of the gestation as well as the maternal uterus and adnexal regions.  COMPARISON:  None.  FINDINGS: Intrauterine gestational sac: Visualized, single common normal in shape.  Yolk sac:  Present  Embryo:  Present  Cardiac Activity: Present  Heart Rate: 162 bpm  MSD:   mm  w     d  CRL:   1.5  mm   7 w 6 d                  [place] EDC: 11/02/2014  Maternal uterus/adnexae: Small to moderate amount of subchorionic hemorrhage. Trace free pelvic fluid. Probable corpus luteum cyst on the left, 1.6 cm. Adjacent simple cyst up to 2.5 cm in short axis.  IMPRESSION: 1. Single living intrauterine pregnancy measuring at 7 weeks 6 days gestation. There is a small to moderate amount of subchorionic hemorrhage. 2. Probable corpus luteum cyst on the left.   Electronically Signed   By: Sherryl Barters M.D.   On: 03/22/2014 19:54   US Ob Transvaginal  03/22/2014   CLINICAL DATA:  Intermittent spotting.  Early pregnancy.  EXAM: OBSTETRIC <14 WK ULTRASOUND  TECHNIQUE: Transabdominal ultrasound was performed for evaluation of the gestation as well as the maternal uterus and adnexal regions.  COMPARISON:  None.  FINDINGS: Intrauterine gestational sac: Visualized, single common normal in shape.  Yolk sac:  Present  Embryo:  Present  Cardiac Activity: Present  Heart Rate: 162 bpm  MSD:   mm    w     d  CRL:   1.5  mm   7 w 6 d                  [place] EDC: 11/02/2014  Maternal uterus/adnexae: Small to moderate amount of subchorionic hemorrhage. Trace free pelvic fluid. Probable corpus luteum cyst on the left, 1.6 cm. Adjacent simple cyst up to 2.5 cm in short axis.  IMPRESSION: 1. Single living intrauterine pregnancy measuring at 7 weeks 6 days gestation. There is a small to moderate amount of subchorionic hemorrhage. 2. Probable corpus  luteum cyst on the left.   Electronically Signed   By: Sherryl Barters M.D.   On: 03/22/2014 19:54    2011: D.W Dr. Harrington Challenger, ok for DC home.  Assessment and Plan   1. Subchorionic hematoma in first trimester   2. Spotting affecting pregnancy in first trimester    Bleeding precautions Return to MAU as needed  Follow-up Information    Follow up with Olga Millers, MD.   Specialty:  Obstetrics and Gynecology   Why:  As scheduled   Contact information:   [address] [place] [id number] [phone]        Mathis Bud 03/22/2014, 8:04 PM

## 2014-03-22 NOTE — Discharge Instructions (Signed)
Subchorionic Hematoma A subchorionic hematoma is a gathering of blood between the outer wall of the placenta and the inner wall of the womb (uterus). The placenta is the organ that connects the fetus to the wall of the uterus. The placenta performs the feeding, breathing (oxygen to the fetus), and waste removal (excretory work) of the fetus.  Subchorionic hematoma is the most common abnormality found on a result from ultrasonography done during the first trimester or early second trimester of pregnancy. If there has been little or no vaginal bleeding, early small hematomas usually shrink on their own and do not affect your baby or pregnancy. The blood is gradually absorbed over 1-2 weeks. When bleeding starts later in pregnancy or the hematoma is larger or occurs in an older pregnant woman, the outcome may not be as good. Larger hematomas may get bigger, which increases the chances for miscarriage. Subchorionic hematoma also increases the risk of premature detachment of the placenta from the uterus, preterm (premature) labor, and stillbirth. HOME CARE INSTRUCTIONS  Stay on bed rest if your health care provider recommends this. Although bed rest will not prevent more bleeding or prevent a miscarriage, your health care provider may recommend bed rest until you are advised otherwise.  Avoid heavy lifting (more than 10 lb [4.5 kg]), exercise, sexual intercourse, or douching as directed by your health care provider.  Keep track of the number of pads you use each day and how soaked (saturated) they are. Write down this information.  Do not use tampons.  Keep all follow-up appointments as directed by your health care provider. Your health care provider may ask you to have follow-up blood tests or ultrasound tests or both. SEEK IMMEDIATE MEDICAL CARE IF:  You have severe cramps in your stomach, back, abdomen, or pelvis.  You have a fever.  You pass large clots or tissue. Save any tissue for your health  care provider to look at.  Your bleeding increases or you become lightheaded, feel weak, or have fainting episodes. Document Released: 07/14/2006 Document Revised: 08/13/2013 Document Reviewed: 10/26/2012 [facility] Patient Information 2015 [place], [place]. This information is not intended to replace advice given to you by your health care provider. Make sure you discuss any questions you have with your health care provider.

## 2014-03-22 NOTE — MAU Note (Signed)
Brown spotting - evaluated by MD 2 weeks ago, stopped.  Started spotting again last night, pt had diarrhea - hx of IBS.  Thinks spotting may be related.  Mild lower abd cramping.

## 2014-04-12 NOTE — L&D Delivery Note (Signed)
Patient was C/C/+4 and pushed for 5 minutes with epidural.   NSVD  female infant, Apgars 8,9, weight P.   The patient had one midline second degree perineal laceration repaired with 2-0 vicryl R. Fundus was firm. EBL was expected amount- my estimate 200 cc. Placenta was delivered intact. Vagina was clear.  Baby was vigorous and doing skin to skin with mother.  Sydney Bradley

## 2014-04-16 ENCOUNTER — Inpatient Hospital Stay (HOSPITAL_COMMUNITY)
Admission: AD | Admit: 2014-04-16 | Discharge: 2014-04-17 | Disposition: A | Discharge status: Home / Self Care | Payer: 59 | Source: Ambulatory Visit | Attending: Obstetrics & Gynecology | Admitting: Obstetrics & Gynecology

## 2014-04-16 ENCOUNTER — Encounter (HOSPITAL_COMMUNITY): Payer: Self-pay | Admitting: *Deleted

## 2014-04-16 DIAGNOSIS — O9989 Other specified diseases and conditions complicating pregnancy, childbirth and the puerperium: Secondary | ICD-10-CM | POA: Insufficient documentation

## 2014-04-16 DIAGNOSIS — R102 Pelvic and perineal pain: Secondary | ICD-10-CM

## 2014-04-16 DIAGNOSIS — O26892 Other specified pregnancy related conditions, second trimester: Secondary | ICD-10-CM

## 2014-04-16 DIAGNOSIS — Z3A11 11 weeks gestation of pregnancy: Secondary | ICD-10-CM | POA: Insufficient documentation

## 2014-04-16 DIAGNOSIS — N949 Unspecified condition associated with female genital organs and menstrual cycle: Secondary | ICD-10-CM | POA: Insufficient documentation

## 2014-04-16 NOTE — MAU Note (Addendum)
PT SAYS SHE  HAS SHARP  PAIN   THAT COMES/ GOES  - STARTED  AT 10 PM..   SPOTTING  STARTED  AFTER 10PM-  WHEN  SHE WENT  TO B-ROOM   AND  WIPED  AND SOME  IN UNDERWEAR-   BROWN  D/C.    HAD DIARRHEA-  LOOSE/ WATERY  X2 .  FEELS  NAUSEA  -  TOOK TUMS .   NO  VOMITING.      LAST TIME  AT DR  BEFORE  CHRISTMAS-  NEXT APPOINTMENT   IS  TOMORROW  AT 2 PM.   FOR  AN  U/S.

## 2014-04-17 ENCOUNTER — Inpatient Hospital Stay (HOSPITAL_COMMUNITY): Payer: 59

## 2014-04-17 ENCOUNTER — Encounter (HOSPITAL_COMMUNITY): Payer: Self-pay

## 2014-04-17 DIAGNOSIS — R102 Pelvic and perineal pain: Secondary | ICD-10-CM

## 2014-04-17 DIAGNOSIS — O9989 Other specified diseases and conditions complicating pregnancy, childbirth and the puerperium: Secondary | ICD-10-CM | POA: Diagnosis not present

## 2014-04-17 DIAGNOSIS — R109 Unspecified abdominal pain: Secondary | ICD-10-CM | POA: Diagnosis present

## 2014-04-17 DIAGNOSIS — N949 Unspecified condition associated with female genital organs and menstrual cycle: Secondary | ICD-10-CM | POA: Diagnosis not present

## 2014-04-17 DIAGNOSIS — Z3A11 11 weeks gestation of pregnancy: Secondary | ICD-10-CM | POA: Diagnosis not present

## 2014-04-17 DIAGNOSIS — O26892 Other specified pregnancy related conditions, second trimester: Secondary | ICD-10-CM

## 2014-04-17 LAB — OB RESULTS CONSOLE HEPATITIS B SURFACE ANTIGEN: HEP B S AG: NEGATIVE

## 2014-04-17 LAB — OB RESULTS CONSOLE HIV ANTIBODY (ROUTINE TESTING): HIV: NONREACTIVE

## 2014-04-17 LAB — OB RESULTS CONSOLE GC/CHLAMYDIA
Chlamydia: NEGATIVE
Gonorrhea: NEGATIVE

## 2014-04-17 LAB — OB RESULTS CONSOLE RPR: RPR: NONREACTIVE

## 2014-04-17 LAB — OB RESULTS CONSOLE ANTIBODY SCREEN: ANTIBODY SCREEN: NEGATIVE

## 2014-04-17 LAB — OB RESULTS CONSOLE RUBELLA ANTIBODY, IGM: Rubella: UNDETERMINED

## 2014-04-17 NOTE — Discharge Instructions (Signed)
Second Trimester of Pregnancy The second trimester is from week 13 through week 28, months 4 through 6. The second trimester is often a time when you feel your best. Your body has also adjusted to being pregnant, and you begin to feel better physically. Usually, morning sickness has lessened or quit completely, you may have more energy, and you may have an increase in appetite. The second trimester is also a time when the fetus is growing rapidly. At the end of the sixth month, the fetus is about 9 inches long and weighs about 1 pounds. You will likely begin to feel the baby move (quickening) between 18 and 20 weeks of the pregnancy. BODY CHANGES Your body goes through many changes during pregnancy. The changes vary from woman to woman.   Your weight will continue to increase. You will notice your lower abdomen bulging out.  You may begin to get stretch marks on your hips, abdomen, and breasts.  You may develop headaches that can be relieved by medicines approved by your health care provider.  You may urinate more often because the fetus is pressing on your bladder.  You may develop or continue to have heartburn as a result of your pregnancy.  You may develop constipation because certain hormones are causing the muscles that push waste through your intestines to slow down.  You may develop hemorrhoids or swollen, bulging veins (varicose veins).  You may have back pain because of the weight gain and pregnancy hormones relaxing your joints between the bones in your pelvis and as a result of a shift in weight and the muscles that support your balance.  Your breasts will continue to grow and be tender.  Your gums may bleed and may be sensitive to brushing and flossing.  Dark spots or blotches (chloasma, mask of pregnancy) may develop on your face. This will likely fade after the baby is born.  A dark line from your belly button to the pubic area (linea nigra) may appear. This will likely fade  after the baby is born.  You may have changes in your hair. These can include thickening of your hair, rapid growth, and changes in texture. Some women also have hair loss during or after pregnancy, or hair that feels dry or thin. Your hair will most likely return to normal after your baby is born. WHAT TO EXPECT AT YOUR PRENATAL VISITS During a routine prenatal visit:  You will be weighed to make sure you and the fetus are growing normally.  Your blood pressure will be taken.  Your abdomen will be measured to track your baby's growth.  The fetal heartbeat will be listened to.  Any test results from the previous visit will be discussed. Your health care provider may ask you:  How you are feeling.  If you are feeling the baby move.  If you have had any abnormal symptoms, such as leaking fluid, bleeding, severe headaches, or abdominal cramping.  If you have any questions. Other tests that may be performed during your second trimester include:  Blood tests that check for:  Low iron levels (anemia).  Gestational diabetes (between 24 and 28 weeks).  Rh antibodies.  Urine tests to check for infections, diabetes, or protein in the urine.  An ultrasound to confirm the proper growth and development of the baby.  An amniocentesis to check for possible genetic problems.  Fetal screens for spina bifida and Down syndrome. HOME CARE INSTRUCTIONS   Avoid all smoking, herbs, alcohol, and unprescribed  drugs. These chemicals affect the formation and growth of the baby.  Follow your health care provider's instructions regarding medicine use. There are medicines that are either safe or unsafe to take during pregnancy.  Exercise only as directed by your health care provider. Experiencing uterine cramps is a good sign to stop exercising.  Continue to eat regular, healthy meals.  Wear a good support bra for breast tenderness.  Do not use hot tubs, steam rooms, or saunas.  Wear your  seat belt at all times when driving.  Avoid raw meat, uncooked cheese, cat litter boxes, and soil used by cats. These carry germs that can cause birth defects in the baby.  Take your prenatal vitamins.  Try taking a stool softener (if your health care provider approves) if you develop constipation. Eat more high-fiber foods, such as fresh vegetables or fruit and whole grains. Drink plenty of fluids to keep your urine clear or pale yellow.  Take warm sitz baths to soothe any pain or discomfort caused by hemorrhoids. Use hemorrhoid cream if your health care provider approves.  If you develop varicose veins, wear support hose. Elevate your feet for 15 minutes, 3-4 times a day. Limit salt in your diet.  Avoid heavy lifting, wear low heel shoes, and practice good posture.  Rest with your legs elevated if you have leg cramps or low back pain.  Visit your dentist if you have not gone yet during your pregnancy. Use a soft toothbrush to brush your teeth and be gentle when you floss.  A sexual relationship may be continued unless your health care provider directs you otherwise.  Continue to go to all your prenatal visits as directed by your health care provider. SEEK MEDICAL CARE IF:   You have dizziness.  You have mild pelvic cramps, pelvic pressure, or nagging pain in the abdominal area.  You have persistent nausea, vomiting, or diarrhea.  You have a bad smelling vaginal discharge.  You have pain with urination. SEEK IMMEDIATE MEDICAL CARE IF:   You have a fever.  You are leaking fluid from your vagina.  You have spotting or bleeding from your vagina.  You have severe abdominal cramping or pain.  You have rapid weight gain or loss.  You have shortness of breath with chest pain.  You notice sudden or extreme swelling of your face, hands, ankles, feet, or legs.  You have not felt your baby move in over an hour.  You have severe headaches that do not go away with  medicine.  You have vision changes. Document Released: 03/23/2001 Document Revised: 04/03/2013 Document Reviewed: 05/30/2012 [facility] Patient Information 2015 [place], [place]. This information is not intended to replace advice given to you by your health care provider. Make sure you discuss any questions you have with your health care provider.

## 2014-04-17 NOTE — MAU Provider Note (Signed)
History     CSN: [id number]  Arrival date and time: 04/16/14 2259   First Provider Initiated Contact with Patient 04/17/14 0158      No chief complaint on file.  HPI  Sydney Bradley is a 34 y.o. G3P0020 at 50w3dwho presents today with cramping. She states that she had diarrhea yesterday, and has had cramping since then. However, today she felt a lot of "pulling" sensations, and a feel like her "period is about to start". She had brown spotting when she wiped earlier. She has had multiple [place] with this pregnancy. She states that she had two at there fertility clinic, one at the office and one here in MAU. She has an appointment today at 1400 for an [place] well.   Past Medical History  Diagnosis Date  . Hypertension     no meds x 3 yrs  . Asthma   . Arthritis     L5 Back - no meds - otc prn  . IBS (irritable bowel syndrome)     Past Surgical History  Procedure Laterality Date  . Tonsillectomy  2007  . Tonsillectomy and adenoidectomy  2007  . Dilation and evacuation  02/25/2012    Procedure: DILATATION AND EVACUATION;  Surgeon: MDaria Pastures MD;  [facility];  Service: Gynecology;  Laterality: N/A;  . Dilation and evacuation N/A 03/01/2013    Procedure: DILATATION AND EVACUATION;  Surgeon: MOlga Millers MD;  [facility];  Service: Gynecology;  Laterality: N/A;    History reviewed. No pertinent family history.  History  Substance Use Topics  . Smoking status: Never Smoker   . Smokeless tobacco: Never Used  . Alcohol Use: No    Allergies:  Allergies  Allergen Reactions  . Latex Hives    Breathing problems  . 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 Penicillins Anaphylaxis  . Shellfish Allergy Anaphylaxis  . Chocolate Itching  . Ciprofloxacin Hives  . Lamisil [Terbinafine] Hives  . Wheat Bran Itching    Prescriptions prior to admission  Medication Sig Dispense Refill Last Dose  . acetaminophen (TYLENOL) 325 MG  tablet Take 650 mg by mouth every 6 (six) hours as needed for mild pain or headache.   Past Week at Unknown time  . Cyanocobalamin (VITAMIN B-12 PO) Take 1 tablet by mouth daily.   04/16/2014 at Unknown time  . diphenhydrAMINE (BENADRYL) 25 MG tablet Take 25-50 mg by mouth 2 (two) times daily as needed for itching or allergies. Patient takes 1 tablet in the morning and 2 at night.   04/16/2014 at Unknown time  . OVER THE COUNTER MEDICATION TUMs   04/16/2014 at Unknown time  . Prenatal Vit-Fe Fumarate-FA (PRENATAL MULTIVITAMIN) TABS tablet Take 1 tablet by mouth daily at 12 noon.   04/16/2014 at Unknown time  . PRESCRIPTION MEDICATION Headache prescribed med   04/16/2014 at Unknown time  . progesterone (PROMETRIUM) 200 MG capsule Take 200 mg by mouth at bedtime.   04/16/2014 at Unknown time  . ranitidine (ZANTAC) 150 MG tablet Take 150 mg by mouth daily.    04/16/2014 at Unknown time  . albuterol (PROVENTIL HFA;VENTOLIN HFA) 108 (90 BASE) MCG/ACT inhaler Inhale 2 puffs into the lungs every 4 (four) hours as needed for wheezing. 1 Inhaler 0 More than a month at Unknown time  . aspirin 81 MG tablet Take 81 mg by mouth at bedtime.   03/21/2014 at Unknown time  . azithromycin (  ZITHROMAX Z-PAK) 250 MG tablet Take 1 tablet (250 mg total) by mouth daily. As directed (Patient not taking: Reported on 03/22/2014) 6 tablet 0 Completed Course at Unknown time  . fluticasone (FLONASE) 50 MCG/ACT nasal spray Place 2 sprays into both nostrils daily. (Patient not taking: Reported on 03/22/2014) 16 g 6 Not Taking at Unknown time  . levocetirizine (XYZAL) 5 MG tablet Take 1 tablet (5 mg total) by mouth every evening. (Patient not taking: Reported on 03/22/2014) 30 tablet 3 Not Taking at Unknown time    ROS Physical Exam   Blood pressure 147/93, pulse 87, temperature 98.5 F (36.9 C), temperature source Oral, resp. rate 20, height 5' 8"  (1.727 m), weight 154.903 kg (341 lb 8 oz), last menstrual period 01/21/2014.  Physical Exam   Nursing note and vitals reviewed. Constitutional: She is oriented to person, place, and time. She appears well-developed and well-nourished. No distress.  Cardiovascular: Normal rate.   Respiratory: Effort normal.  GI: Soft. There is no tenderness. There is no rebound.  Neurological: She is alert and oriented to person, place, and time.  Skin: Skin is warm and dry.  Psychiatric: She has a normal mood and affect.   Unable to doppler FHT MAU Course  Procedures  0218: D/W Dr. Alwyn Pea, patient can be given the option for [place] today or tomorrow.  7510: D/W the patient. She would prefer to proceed with the [place] tonight.    US Ob Comp Less 14 Wks  04/17/2014   CLINICAL DATA:  Cramping. Estimated gestational age by LMP is 12 weeks 2 days.  EXAM: OBSTETRIC <14 WK ULTRASOUND  TECHNIQUE: Transabdominal ultrasound was performed for evaluation of the gestation as well as the maternal uterus and adnexal regions.  COMPARISON:  03/22/2014  FINDINGS: Intrauterine gestational sac: A single intrauterine pregnancy is visualized.  Yolk sac: Yolk sac is not visualized consistent with gestational age.  Embryo:  Fetal pole is identified.  Cardiac Activity: Fetal cardiac activity is observed.  Heart Rate: 147 bpm  CRL:   52  mm   12 w 0 d                  [place] EDC: 10/30/2014  Maternal uterus/adnexae: Uterus is anteverted. No myometrial masses identified. Both ovaries are visualized and appear normal. There is a benign-appearing paraovarian cyst shown in the left adnexa measuring 2.2 x 2.1 x 1.8 cm. No free pelvic fluid.  IMPRESSION: Single intrauterine pregnancy. Estimated gestational age by crown-rump length is 12 weeks 0 days, representing appropriate growth since the previous study. No acute complication is suggested.   Electronically Signed   By: Lucienne Capers M.D.   On: 04/17/2014 02:53    Assessment and Plan   1. Pelvic pain affecting pregnancy in second trimester, antepartum   2. Pelvic pain affecting pregnancy in  second trimester, antepartum    Precautions given Return to MAU as needed  Follow-up Information    Follow up with Olga Millers, MD.   Specialty:  Obstetrics and Gynecology   Why:  As scheduled for later today    Contact information:   [place] [id number] [phone]        Mathis Bud 04/17/2014, 2:04 AM

## 2014-05-13 ENCOUNTER — Other Ambulatory Visit (HOSPITAL_COMMUNITY): Payer: Self-pay | Admitting: Obstetrics and Gynecology

## 2014-05-13 ENCOUNTER — Other Ambulatory Visit: Payer: Self-pay | Admitting: Family

## 2014-05-13 DIAGNOSIS — Z3A18 18 weeks gestation of pregnancy: Secondary | ICD-10-CM

## 2014-05-13 DIAGNOSIS — Z3689 Encounter for other specified antenatal screening: Secondary | ICD-10-CM

## 2014-05-13 DIAGNOSIS — O09522 Supervision of elderly multigravida, second trimester: Secondary | ICD-10-CM

## 2014-05-23 ENCOUNTER — Encounter (HOSPITAL_COMMUNITY): Payer: Self-pay | Admitting: Obstetrics and Gynecology

## 2014-05-31 ENCOUNTER — Ambulatory Visit (HOSPITAL_COMMUNITY)
Admission: RE | Admit: 2014-05-31 | Discharge: 2014-05-31 | Disposition: A | Discharge status: Home / Self Care | Payer: 59 | Source: Ambulatory Visit | Attending: Obstetrics and Gynecology | Admitting: Obstetrics and Gynecology

## 2014-05-31 ENCOUNTER — Encounter (HOSPITAL_COMMUNITY): Payer: Self-pay

## 2014-05-31 DIAGNOSIS — Z3689 Encounter for other specified antenatal screening: Secondary | ICD-10-CM | POA: Insufficient documentation

## 2014-05-31 DIAGNOSIS — O09522 Supervision of elderly multigravida, second trimester: Secondary | ICD-10-CM

## 2014-05-31 DIAGNOSIS — Z36 Encounter for antenatal screening of mother: Secondary | ICD-10-CM | POA: Insufficient documentation

## 2014-05-31 DIAGNOSIS — Z3A18 18 weeks gestation of pregnancy: Secondary | ICD-10-CM

## 2014-05-31 DIAGNOSIS — O99210 Obesity complicating pregnancy, unspecified trimester: Secondary | ICD-10-CM

## 2014-05-31 DIAGNOSIS — O09529 Supervision of elderly multigravida, unspecified trimester: Secondary | ICD-10-CM | POA: Insufficient documentation

## 2014-05-31 DIAGNOSIS — O10912 Unspecified pre-existing hypertension complicating pregnancy, second trimester: Secondary | ICD-10-CM | POA: Insufficient documentation

## 2014-06-03 ENCOUNTER — Other Ambulatory Visit (HOSPITAL_COMMUNITY): Payer: Self-pay

## 2014-06-04 ENCOUNTER — Other Ambulatory Visit (HOSPITAL_COMMUNITY): Payer: Self-pay | Admitting: Maternal and Fetal Medicine

## 2014-06-04 DIAGNOSIS — O09522 Supervision of elderly multigravida, second trimester: Secondary | ICD-10-CM

## 2014-06-04 DIAGNOSIS — O99212 Obesity complicating pregnancy, second trimester: Secondary | ICD-10-CM

## 2014-06-04 DIAGNOSIS — O10912 Unspecified pre-existing hypertension complicating pregnancy, second trimester: Secondary | ICD-10-CM

## 2014-06-12 ENCOUNTER — Encounter: Payer: Self-pay | Admitting: Family Medicine

## 2014-06-12 ENCOUNTER — Ambulatory Visit (INDEPENDENT_AMBULATORY_CARE_PROVIDER_SITE_OTHER): Payer: 59 | Admitting: Family Medicine

## 2014-06-12 VITALS — BP 118/74 | HR 98 | Temp 98.2°F | Wt 343.0 lb

## 2014-06-12 DIAGNOSIS — B349 Viral infection, unspecified: Secondary | ICD-10-CM

## 2014-06-12 NOTE — Progress Notes (Signed)
Pre visit review using our clinic review tool, if applicable. No additional management support is needed unless otherwise documented below in the visit note. 

## 2014-06-12 NOTE — Progress Notes (Signed)
   Subjective:    Patient ID: Sydney Bradley, female    DOB: Apr 07, 1981, 34 y.o.   MRN: [id number]  HPI Acute visit. Patient is 5 months pregnant. She had onset last weekend of cough, ear fullness, body aches, headaches, rhinorrhea. She saw her GYN Monday. She was prescribed Zyrtec which has not helped much with her congestion. She's had some chills off and on but no documented fever. She's taken Tylenol which helps. No nausea or vomiting. No recent complications with her pregnancy. She apparently did not have flu vaccine but has not had any documented fever with this illness  Past Medical History  Diagnosis Date  . Hypertension     no meds x 3 yrs  . Asthma   . Arthritis     L5 Back - no meds - otc prn  . IBS (irritable bowel syndrome)    Past Surgical History  Procedure Laterality Date  . Tonsillectomy  2007  . Tonsillectomy and adenoidectomy  2007  . Dilation and evacuation  02/25/2012    Procedure: DILATATION AND EVACUATION;  Surgeon: Daria Pastures, MD;  Location: [facility];  Service: Gynecology;  Laterality: N/A;  . Dilation and evacuation N/A 03/01/2013    Procedure: DILATATION AND EVACUATION;  Surgeon: Olga Millers, MD;  Location: [facility];  Service: Gynecology;  Laterality: N/A;    reports that she has never smoked. She has never used smokeless tobacco. She reports that she does not drink alcohol or use illicit drugs. family history is not on file. Allergies  Allergen Reactions  . Latex Hives    Breathing problems  . 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 Penicillins Anaphylaxis  . Shellfish Allergy Anaphylaxis  . Chocolate Itching  . Ciprofloxacin Hives  . Lamisil [Terbinafine] Hives  . Wheat Bran Itching      Review of Systems  Constitutional: Positive for chills and fatigue.  HENT: Positive for congestion and sinus pressure.   Respiratory: Positive for cough.   Skin: Negative for rash.  Neurological: Positive for headaches.        Objective:   Physical Exam  Constitutional: She appears well-developed and well-nourished.  HENT:  Right Ear: External ear normal.  Left Ear: External ear normal.  Mouth/Throat: Oropharynx is clear and moist.  Neck: Neck supple.  Cardiovascular: Normal rate and regular rhythm.   Pulmonary/Chest: Effort normal and breath sounds normal. No respiratory distress. She has no wheezes. She has no rales.  Lymphadenopathy:    She has no cervical adenopathy.          Assessment & Plan:  Viral syndrome. She's not had any significant fever to suggest likely flu. Continue symptomatic measures with Tylenol. Stay well-hydrated. Follow-up for fever or any new or worsening symptoms.

## 2014-06-12 NOTE — Patient Instructions (Signed)

## 2014-06-28 ENCOUNTER — Encounter (HOSPITAL_COMMUNITY): Payer: Self-pay

## 2014-06-28 ENCOUNTER — Ambulatory Visit (HOSPITAL_COMMUNITY)
Admission: RE | Admit: 2014-06-28 | Discharge: 2014-06-28 | Disposition: A | Discharge status: Home / Self Care | Payer: 59 | Source: Ambulatory Visit | Attending: Obstetrics and Gynecology | Admitting: Obstetrics and Gynecology

## 2014-06-28 ENCOUNTER — Other Ambulatory Visit (HOSPITAL_COMMUNITY): Payer: Self-pay | Admitting: Maternal and Fetal Medicine

## 2014-06-28 DIAGNOSIS — O10912 Unspecified pre-existing hypertension complicating pregnancy, second trimester: Secondary | ICD-10-CM

## 2014-06-28 DIAGNOSIS — IMO0002 Reserved for concepts with insufficient information to code with codable children: Secondary | ICD-10-CM | POA: Insufficient documentation

## 2014-06-28 DIAGNOSIS — O99212 Obesity complicating pregnancy, second trimester: Secondary | ICD-10-CM | POA: Diagnosis not present

## 2014-06-28 DIAGNOSIS — O09522 Supervision of elderly multigravida, second trimester: Secondary | ICD-10-CM

## 2014-06-28 DIAGNOSIS — Z36 Encounter for antenatal screening of mother: Secondary | ICD-10-CM | POA: Diagnosis not present

## 2014-06-28 DIAGNOSIS — O10012 Pre-existing essential hypertension complicating pregnancy, second trimester: Secondary | ICD-10-CM | POA: Diagnosis not present

## 2014-06-28 DIAGNOSIS — Z0489 Encounter for examination and observation for other specified reasons: Secondary | ICD-10-CM | POA: Insufficient documentation

## 2014-06-28 DIAGNOSIS — O10919 Unspecified pre-existing hypertension complicating pregnancy, unspecified trimester: Secondary | ICD-10-CM | POA: Insufficient documentation

## 2014-06-28 DIAGNOSIS — O09512 Supervision of elderly primigravida, second trimester: Secondary | ICD-10-CM | POA: Diagnosis not present

## 2014-06-28 DIAGNOSIS — Z3A21 21 weeks gestation of pregnancy: Secondary | ICD-10-CM | POA: Insufficient documentation

## 2014-07-26 ENCOUNTER — Ambulatory Visit (HOSPITAL_COMMUNITY): Payer: 59

## 2014-10-04 ENCOUNTER — Other Ambulatory Visit: Payer: Self-pay

## 2014-10-11 ENCOUNTER — Encounter (HOSPITAL_COMMUNITY): Payer: Self-pay | Admitting: *Deleted

## 2014-10-11 ENCOUNTER — Inpatient Hospital Stay (HOSPITAL_COMMUNITY)
Admission: AD | Admit: 2014-10-11 | Discharge: 2014-10-14 | DRG: 774 | Disposition: A | Discharge status: Home / Self Care | Payer: 59 | Source: Ambulatory Visit | Attending: Obstetrics and Gynecology | Admitting: Obstetrics and Gynecology

## 2014-10-11 DIAGNOSIS — Z6841 Body Mass Index (BMI) 40.0 and over, adult: Secondary | ICD-10-CM

## 2014-10-11 DIAGNOSIS — O113 Pre-existing hypertension with pre-eclampsia, third trimester: Principal | ICD-10-CM | POA: Diagnosis present

## 2014-10-11 DIAGNOSIS — O09523 Supervision of elderly multigravida, third trimester: Secondary | ICD-10-CM | POA: Diagnosis not present

## 2014-10-11 DIAGNOSIS — Z3A36 36 weeks gestation of pregnancy: Secondary | ICD-10-CM | POA: Diagnosis present

## 2014-10-11 DIAGNOSIS — Z3490 Encounter for supervision of normal pregnancy, unspecified, unspecified trimester: Secondary | ICD-10-CM

## 2014-10-11 DIAGNOSIS — O99214 Obesity complicating childbirth: Secondary | ICD-10-CM | POA: Diagnosis present

## 2014-10-11 DIAGNOSIS — T801XXA Vascular complications following infusion, transfusion and therapeutic injection, initial encounter: Secondary | ICD-10-CM

## 2014-10-11 DIAGNOSIS — O10912 Unspecified pre-existing hypertension complicating pregnancy, second trimester: Secondary | ICD-10-CM

## 2014-10-11 DIAGNOSIS — O1092 Unspecified pre-existing hypertension complicating childbirth: Secondary | ICD-10-CM | POA: Diagnosis present

## 2014-10-11 DIAGNOSIS — Z95828 Presence of other vascular implants and grafts: Secondary | ICD-10-CM

## 2014-10-11 HISTORY — DX: Unspecified fracture of sacrum, initial encounter for closed fracture: S32.10XA

## 2014-10-11 LAB — OB RESULTS CONSOLE GBS: STREP GROUP B AG: NEGATIVE

## 2014-10-11 LAB — CBC
HCT: 37.9 % (ref 36.0–46.0)
Hemoglobin: 12.7 g/dL (ref 12.0–15.0)
MCH: 27.1 pg (ref 26.0–34.0)
MCHC: 33.5 g/dL (ref 30.0–36.0)
MCV: 80.8 fL (ref 78.0–100.0)
PLATELETS: 192 10*3/uL (ref 150–400)
RBC: 4.69 MIL/uL (ref 3.87–5.11)
RDW: 14 % (ref 11.5–15.5)
WBC: 8 10*3/uL (ref 4.0–10.5)

## 2014-10-11 LAB — TYPE AND SCREEN
ABO/RH(D): B POS
Antibody Screen: NEGATIVE

## 2014-10-11 MED ORDER — CITRIC ACID-SODIUM CITRATE 334-500 MG/5ML PO SOLN: 30.0000 mL | ORAL | Status: DC | PRN

## 2014-10-11 MED ORDER — OXYCODONE-ACETAMINOPHEN 5-325 MG PO TABS: 2.0000 | ORAL_TABLET | ORAL | Status: DC | PRN

## 2014-10-11 MED ORDER — MISOPROSTOL 25 MCG QUARTER TABLET
25.0000 ug | ORAL_TABLET | ORAL | Status: DC | PRN
  Administered 2014-10-11 – 2014-10-12 (×2): 25 ug via VAGINAL
  Filled 2014-10-11 (×2): qty 0.25

## 2014-10-11 MED ORDER — OXYTOCIN 40 UNITS IN LACTATED RINGERS INFUSION - SIMPLE MED
62.5000 mL/h | INTRAVENOUS | Status: DC
  Administered 2014-10-12: 62.5 mL/h via INTRAVENOUS
  Filled 2014-10-11: qty 1000

## 2014-10-11 MED ORDER — LACTATED RINGERS IV SOLN
INTRAVENOUS | Status: DC
Start: 2014-10-11 — End: 2014-10-12
  Administered 2014-10-11 – 2014-10-12 (×4): via INTRAVENOUS

## 2014-10-11 MED ORDER — LABETALOL HCL 200 MG PO TABS
200.0000 mg | ORAL_TABLET | Freq: Two times a day (BID) | ORAL | Status: DC
  Administered 2014-10-11: 200 mg via ORAL
  Filled 2014-10-11 (×4): qty 1

## 2014-10-11 MED ORDER — OXYCODONE-ACETAMINOPHEN 5-325 MG PO TABS: 1.0000 | ORAL_TABLET | ORAL | Status: DC | PRN

## 2014-10-11 MED ORDER — FLEET ENEMA 7-19 GM/118ML RE ENEM: 1.0000 | ENEMA | RECTAL | Status: DC | PRN

## 2014-10-11 MED ORDER — ACETAMINOPHEN 325 MG PO TABS
650.0000 mg | ORAL_TABLET | ORAL | Status: DC | PRN
  Filled 2014-10-11: qty 2

## 2014-10-11 MED ORDER — OXYTOCIN BOLUS FROM INFUSION: 500.0000 mL | INTRAVENOUS | Status: DC

## 2014-10-11 MED ORDER — ONDANSETRON HCL 4 MG/2ML IJ SOLN: 4.0000 mg | Freq: Four times a day (QID) | INTRAMUSCULAR | Status: DC | PRN

## 2014-10-11 MED ORDER — TERBUTALINE SULFATE 1 MG/ML IJ SOLN: 0.2500 mg | Freq: Once | INTRAMUSCULAR | Status: AC | PRN

## 2014-10-11 MED ORDER — LACTATED RINGERS IV SOLN: 500.0000 mL | INTRAVENOUS | Status: DC | PRN

## 2014-10-11 MED ORDER — ZOLPIDEM TARTRATE 5 MG PO TABS
10.0000 mg | ORAL_TABLET | Freq: Every evening | ORAL | Status: DC | PRN
  Administered 2014-10-11: 10 mg via ORAL
  Filled 2014-10-11: qty 2

## 2014-10-11 MED ORDER — BUTORPHANOL TARTRATE 1 MG/ML IJ SOLN
1.0000 mg | INTRAMUSCULAR | Status: DC | PRN
  Administered 2014-10-12 (×2): 1 mg via INTRAVENOUS
  Filled 2014-10-11 (×3): qty 1

## 2014-10-11 MED ORDER — LIDOCAINE HCL (PF) 1 % IJ SOLN
30.0000 mL | INTRAMUSCULAR | Status: DC | PRN
  Filled 2014-10-11: qty 30

## 2014-10-11 MED ORDER — ZOLPIDEM TARTRATE 5 MG PO TABS: 5.0000 mg | ORAL_TABLET | Freq: Every evening | ORAL | Status: DC | PRN

## 2014-10-11 NOTE — H&P (Signed)
34 y.o. [name] comes in for induction for CHTN with superimposed preeclampsia.  Pt had well controlled BPS in the office but lately they have been increasing with diastolics in the nineties.  Today BP went up to 140/90.  Also she has not had proteinuria previously but now has persistent trace proteinuria.  Otherwise has good fetal movement and no bleeding.  Her cervix is very unfavorable is expected to take some ripening before induction can start in earnest.  She denies HA but had had persistent nausea that has not been responsive to prilosec.  Labs were done at the office and are pending.  Past Medical History  Diagnosis Date  . Hypertension     no meds x 3 yrs  . Arthritis     L5 Back - no meds - otc prn  . IBS (irritable bowel syndrome)   . Asthma   . Sacral fracture     Past Surgical History  Procedure Laterality Date  . Tonsillectomy  2007  . Tonsillectomy and adenoidectomy  2007  . Dilation and evacuation  02/25/2012    Procedure: DILATATION AND EVACUATION;  Surgeon: MDaria Pastures MD;  [facility];  Service: Gynecology;  Laterality: N/A;  . Dilation and evacuation N/A 03/01/2013    Procedure: DILATATION AND EVACUATION;  Surgeon: MOlga Millers MD;  [facility];  Service: Gynecology;  Laterality: N/A;    OB History  Gravida Para Term Preterm AB SAB TAB Ectopic Multiple Living  3    2 2     0    # Outcome Date GA Lbr Len/2nd Weight Sex Delivery Anes PTL Lv  3 Current           2 SAB           1 SAB               History   Social History  . Marital Status: Married    Spouse Name: N/A  . Number of Children: N/A  . Years of Education: N/A   Occupational History  . Not on file.   Social History Main Topics  . Smoking status: Never Smoker   . Smokeless tobacco: Never Used  . Alcohol Use: No  . Drug Use: No  . Sexual Activity: Yes    Birth Control/ Protection: None     Comment: approx [redacted]wks gestation   Other Topics Concern  . Not on file    Social History Narrative   Latex; Peanut-containing drug products; Penicillins; Shellfish allergy; Chocolate; Ciprofloxacin; Lamisil; and Wheat bran    Prenatal Transfer Tool  Maternal Diabetes: No Genetic Screening: Normal Maternal Ultrasounds/Referrals: Normal Fetal Ultrasounds or other Referrals:  None Maternal Substance Abuse:  No Significant Maternal Medications:  Meds include: Other: labetalol Significant Maternal Lab Results: None  Other PNC: uncomplicated to this point. Pt is an infertility pt of Dr. DBinnie Rail   Filed Vitals:   10/11/14 1759 10/11/14 1803 10/11/14 1805  BP:  157/96 126/80  Pulse:  88 89  Temp:   99.1 F (37.3 C)  TempSrc:   Oral  Resp:   18  Height: 5' 9"  (1.753 m)    Weight: 154.223 kg (340 lb)      Lungs/Cor:  NAD Abdomen:  soft, gravid Ex:  no cords, erythema SVE:  Cl/th/high FHTs:  130s, good STV, NST R Toco:  q occ   A/P   Chronic hypertension with superimposed preeclampsia and labile blood pressures.  For induction with cytotec.  GBS neg.  Sydney Bradley A

## 2014-10-12 ENCOUNTER — Inpatient Hospital Stay (HOSPITAL_COMMUNITY): Payer: 59 | Admitting: Anesthesiology

## 2014-10-12 ENCOUNTER — Inpatient Hospital Stay (HOSPITAL_COMMUNITY): Payer: 59

## 2014-10-12 LAB — COMPREHENSIVE METABOLIC PANEL
ALT: 12 U/L — ABNORMAL LOW (ref 14–54)
AST: 17 U/L (ref 15–41)
Albumin: 3 g/dL — ABNORMAL LOW (ref 3.5–5.0)
Alkaline Phosphatase: 86 U/L (ref 38–126)
Anion gap: 4 — ABNORMAL LOW (ref 5–15)
BUN: 5 mg/dL — AB (ref 6–20)
CO2: 23 mmol/L (ref 22–32)
CREATININE: 0.49 mg/dL (ref 0.44–1.00)
Calcium: 9.1 mg/dL (ref 8.9–10.3)
Chloride: 108 mmol/L (ref 101–111)
GFR calc Af Amer: >60 mL/min (ref >60)
GFR calc non Af Amer: >60 mL/min (ref >60)
Glucose, Bld: 95 mg/dL (ref 65–99)
Potassium: 3.8 mmol/L (ref 3.5–5.1)
Sodium: 135 mmol/L (ref 135–145)
Total Bilirubin: 0.6 mg/dL (ref 0.3–1.2)
Total Protein: 6.4 g/dL — ABNORMAL LOW (ref 6.5–8.1)

## 2014-10-12 LAB — PROTEIN / CREATININE RATIO, URINE
CREATININE, URINE: 102 mg/dL
PROTEIN CREATININE RATIO: 0.1 mg/mg{creat} (ref 0.00–0.15)
TOTAL PROTEIN, URINE: 10 mg/dL

## 2014-10-12 LAB — CBC
HEMATOCRIT: 34 % — AB (ref 36.0–46.0)
Hemoglobin: 11.7 g/dL — ABNORMAL LOW (ref 12.0–15.0)
MCH: 27.7 pg (ref 26.0–34.0)
MCHC: 34.4 g/dL (ref 30.0–36.0)
MCV: 80.4 fL (ref 78.0–100.0)
PLATELETS: 173 10*3/uL (ref 150–400)
RBC: 4.23 MIL/uL (ref 3.87–5.11)
RDW: 14 % (ref 11.5–15.5)
WBC: 6.4 10*3/uL (ref 4.0–10.5)

## 2014-10-12 LAB — RPR: RPR Ser Ql: NONREACTIVE

## 2014-10-12 LAB — URIC ACID: URIC ACID, SERUM: 4.6 mg/dL (ref 2.3–6.6)

## 2014-10-12 MED ORDER — PRENATAL MULTIVITAMIN CH: 1.0000 | ORAL_TABLET | Freq: Every day | ORAL | Status: DC

## 2014-10-12 MED ORDER — ACETAMINOPHEN 325 MG PO TABS: 650.0000 mg | ORAL_TABLET | ORAL | Status: DC | PRN

## 2014-10-12 MED ORDER — LABETALOL HCL 200 MG PO TABS
200.0000 mg | ORAL_TABLET | Freq: Two times a day (BID) | ORAL | Status: DC
  Administered 2014-10-13: 200 mg via ORAL
  Filled 2014-10-12 (×3): qty 1

## 2014-10-12 MED ORDER — ONDANSETRON HCL 4 MG/2ML IJ SOLN: 4.0000 mg | INTRAMUSCULAR | Status: DC | PRN

## 2014-10-12 MED ORDER — SODIUM CHLORIDE 0.9 % IJ SOLN
10.0000 mL | Freq: Two times a day (BID) | INTRAMUSCULAR | Status: DC
  Administered 2014-10-12: 10 mL
  Filled 2014-10-12 (×3): qty 40

## 2014-10-12 MED ORDER — DIPHENHYDRAMINE HCL 25 MG PO CAPS
25.0000 mg | ORAL_CAPSULE | Freq: Four times a day (QID) | ORAL | Status: DC | PRN
Start: 2014-10-12 — End: 2014-10-14

## 2014-10-12 MED ORDER — OXYCODONE-ACETAMINOPHEN 5-325 MG PO TABS
1.0000 | ORAL_TABLET | ORAL | Status: DC | PRN
  Administered 2014-10-13 – 2014-10-14 (×5): 1 via ORAL
  Filled 2014-10-12 (×5): qty 1

## 2014-10-12 MED ORDER — DIPHENHYDRAMINE HCL 25 MG PO TABS
25.0000 mg | ORAL_TABLET | Freq: Every day | ORAL | Status: DC | PRN
  Filled 2014-10-12: qty 1

## 2014-10-12 MED ORDER — FERROUS SULFATE 325 (65 FE) MG PO TABS
325.0000 mg | ORAL_TABLET | Freq: Two times a day (BID) | ORAL | Status: DC
  Administered 2014-10-13 – 2014-10-14 (×3): 325 mg via ORAL
  Filled 2014-10-12 (×4): qty 1

## 2014-10-12 MED ORDER — LIDOCAINE HCL (PF) 1 % IJ SOLN
INTRAMUSCULAR | Status: DC | PRN
  Administered 2014-10-12 (×2): 4 mL

## 2014-10-12 MED ORDER — BENZOCAINE-MENTHOL 20-0.5 % EX AERO
1.0000 "application " | INHALATION_SPRAY | CUTANEOUS | Status: DC | PRN
  Filled 2014-10-12: qty 56

## 2014-10-12 MED ORDER — DIPHENHYDRAMINE HCL 50 MG/ML IJ SOLN: 12.5000 mg | INTRAMUSCULAR | Status: DC | PRN

## 2014-10-12 MED ORDER — OXYCODONE-ACETAMINOPHEN 5-325 MG PO TABS: 2.0000 | ORAL_TABLET | ORAL | Status: DC | PRN

## 2014-10-12 MED ORDER — PHENYLEPHRINE 40 MCG/ML (10ML) SYRINGE FOR IV PUSH (FOR BLOOD PRESSURE SUPPORT)
80.0000 ug | PREFILLED_SYRINGE | INTRAVENOUS | Status: DC | PRN
  Administered 2014-10-12 (×2): 80 ug via INTRAVENOUS
  Filled 2014-10-12: qty 20

## 2014-10-12 MED ORDER — LANOLIN HYDROUS EX OINT: TOPICAL_OINTMENT | CUTANEOUS | Status: DC | PRN

## 2014-10-12 MED ORDER — CETIRIZINE HCL 10 MG PO TABS: 10.0000 mg | ORAL_TABLET | Freq: Every day | ORAL | Status: DC

## 2014-10-12 MED ORDER — SODIUM CHLORIDE 0.9 % IJ SOLN
10.0000 mL | INTRAMUSCULAR | Status: DC | PRN
  Administered 2014-10-12: 10 mL
  Filled 2014-10-12: qty 40

## 2014-10-12 MED ORDER — ONDANSETRON HCL 4 MG PO TABS: 4.0000 mg | ORAL_TABLET | ORAL | Status: DC | PRN

## 2014-10-12 MED ORDER — METHYLERGONOVINE MALEATE 0.2 MG PO TABS: 0.2000 mg | ORAL_TABLET | ORAL | Status: DC | PRN

## 2014-10-12 MED ORDER — OXYTOCIN 40 UNITS IN LACTATED RINGERS INFUSION - SIMPLE MED
1.0000 m[IU]/min | INTRAVENOUS | Status: DC
  Administered 2014-10-12: 2 m[IU]/min via INTRAVENOUS

## 2014-10-12 MED ORDER — METHYLERGONOVINE MALEATE 0.2 MG/ML IJ SOLN: 0.2000 mg | INTRAMUSCULAR | Status: DC | PRN

## 2014-10-12 MED ORDER — TERBUTALINE SULFATE 1 MG/ML IJ SOLN
0.2500 mg | Freq: Once | INTRAMUSCULAR | Status: DC | PRN
Start: 2014-10-12 — End: 2014-10-12

## 2014-10-12 MED ORDER — SIMETHICONE 80 MG PO CHEW: 80.0000 mg | CHEWABLE_TABLET | ORAL | Status: DC | PRN

## 2014-10-12 MED ORDER — MEASLES, MUMPS & RUBELLA VAC ~~LOC~~ INJ
0.5000 mL | INJECTION | Freq: Once | SUBCUTANEOUS | Status: AC
  Administered 2014-10-14: 0.5 mL via SUBCUTANEOUS
  Filled 2014-10-12 (×3): qty 0.5

## 2014-10-12 MED ORDER — WITCH HAZEL-GLYCERIN EX PADS: 1.0000 "application " | MEDICATED_PAD | CUTANEOUS | Status: DC | PRN

## 2014-10-12 MED ORDER — SENNOSIDES-DOCUSATE SODIUM 8.6-50 MG PO TABS
2.0000 | ORAL_TABLET | ORAL | Status: DC
  Administered 2014-10-13: 2 via ORAL
  Filled 2014-10-12: qty 2

## 2014-10-12 MED ORDER — FENTANYL CITRATE (PF) 100 MCG/2ML IJ SOLN
INTRAMUSCULAR | Status: AC
  Filled 2014-10-12: qty 2

## 2014-10-12 MED ORDER — DIBUCAINE 1 % RE OINT
1.0000 "application " | TOPICAL_OINTMENT | RECTAL | Status: DC | PRN
  Filled 2014-10-12: qty 28

## 2014-10-12 MED ORDER — SODIUM CHLORIDE 0.9 % IJ SOLN: 3.0000 mL | INTRAMUSCULAR | Status: DC | PRN

## 2014-10-12 MED ORDER — EPHEDRINE 5 MG/ML INJ: 10.0000 mg | INTRAVENOUS | Status: DC | PRN

## 2014-10-12 MED ORDER — ALBUTEROL SULFATE (2.5 MG/3ML) 0.083% IN NEBU: 3.0000 mL | INHALATION_SOLUTION | RESPIRATORY_TRACT | Status: DC | PRN

## 2014-10-12 MED ORDER — SODIUM CHLORIDE 0.9 % IV SOLN: 250.0000 mL | INTRAVENOUS | Status: DC | PRN

## 2014-10-12 MED ORDER — FENTANYL 2.5 MCG/ML BUPIVACAINE 1/10 % EPIDURAL INFUSION (WH - ANES)
14.0000 mL/h | INTRAMUSCULAR | Status: DC | PRN
  Administered 2014-10-12 (×2): 14 mL/h via EPIDURAL
  Filled 2014-10-12 (×2): qty 125

## 2014-10-12 MED ORDER — FAMOTIDINE 20 MG PO TABS
20.0000 mg | ORAL_TABLET | Freq: Two times a day (BID) | ORAL | Status: DC
  Administered 2014-10-13 – 2014-10-14 (×3): 20 mg via ORAL
  Filled 2014-10-12 (×3): qty 1

## 2014-10-12 MED ORDER — SODIUM CHLORIDE 0.9 % IJ SOLN: 3.0000 mL | Freq: Two times a day (BID) | INTRAMUSCULAR | Status: DC

## 2014-10-12 MED ORDER — ZOLPIDEM TARTRATE 5 MG PO TABS: 5.0000 mg | ORAL_TABLET | Freq: Every evening | ORAL | Status: DC | PRN

## 2014-10-12 MED ORDER — TETANUS-DIPHTH-ACELL PERTUSSIS 5-2.5-18.5 LF-MCG/0.5 IM SUSP
0.5000 mL | Freq: Once | INTRAMUSCULAR | Status: DC
  Filled 2014-10-12: qty 0.5

## 2014-10-12 MED ORDER — IBUPROFEN 800 MG PO TABS
800.0000 mg | ORAL_TABLET | Freq: Three times a day (TID) | ORAL | Status: DC
  Administered 2014-10-13 – 2014-10-14 (×4): 800 mg via ORAL
  Filled 2014-10-12 (×4): qty 1

## 2014-10-12 MED ORDER — MAGNESIUM HYDROXIDE 400 MG/5ML PO SUSP: 30.0000 mL | ORAL | Status: DC | PRN

## 2014-10-12 NOTE — Progress Notes (Signed)
Dr Jillyn Hidden attempted x2 to restart IV access.   POC for PICC line insertion started.  Consent obtained.

## 2014-10-12 NOTE — Progress Notes (Signed)
I called Dr. Philis Pique to update: patient still 4cm, -1 with caput.  Patient breathing through each contraction after epidural bolus.  Occasional deep variables with contractions.  Per Dr. Philis Pique, cut Pitocin dose in half and increase per protocol, RN to call MD if variables continue.  Cristal Generous RN

## 2014-10-12 NOTE — Progress Notes (Signed)
Pt still fairly comfortable- pitocin started.   FHTs. 130s, gSTV, NST R, cat 1 Toco q 10 SVE 1-2/60/-2, AROM clear   Continue.

## 2014-10-12 NOTE — Progress Notes (Signed)
FHTs have been 110-120s with gSTV and NST R- cat 1.  Three moderate variables after placement of IUPC and FSE- now back to cat 1.  Toco q 3-4  SVE 4/80/-2   Results for orders placed or performed during the hospital encounter of 10/11/14 (from the past 24 hour(s))  CBC     Status: None   Collection Time: 10/11/14  6:51 PM  Result Value Ref Range   WBC 8.0 4.0 - 10.5 K/uL   RBC 4.69 3.87 - 5.11 MIL/uL   Hemoglobin 12.7 12.0 - 15.0 g/dL   HCT 37.9 36.0 - 46.0 %   MCV 80.8 78.0 - 100.0 fL   MCH 27.1 26.0 - 34.0 pg   MCHC 33.5 30.0 - 36.0 g/dL   RDW 14.0 11.5 - 15.5 %   Platelets 192 150 - 400 K/uL  RPR     Status: None   Collection Time: 10/11/14  6:51 PM  Result Value Ref Range   RPR Ser Ql Non Reactive Non Reactive  Type and screen     Status: None   Collection Time: 10/11/14  6:51 PM  Result Value Ref Range   ABO/RH(D) B POS    Antibody Screen NEG    Sample Expiration 10/14/2014   CBC     Status: Abnormal   Collection Time: 10/12/14 11:30 AM  Result Value Ref Range   WBC 6.4 4.0 - 10.5 K/uL   RBC 4.23 3.87 - 5.11 MIL/uL   Hemoglobin 11.7 (L) 12.0 - 15.0 g/dL   HCT 34.0 (L) 36.0 - 46.0 %   MCV 80.4 78.0 - 100.0 fL   MCH 27.7 26.0 - 34.0 pg   MCHC 34.4 30.0 - 36.0 g/dL   RDW 14.0 11.5 - 15.5 %   Platelets 173 150 - 400 K/uL  Comprehensive metabolic panel     Status: Abnormal   Collection Time: 10/12/14  3:00 PM  Result Value Ref Range   Sodium 135 135 - 145 mmol/L   Potassium 3.8 3.5 - 5.1 mmol/L   Chloride 108 101 - 111 mmol/L   CO2 23 22 - 32 mmol/L   Glucose, Bld 95 65 - 99 mg/dL   BUN 5 (L) 6 - 20 mg/dL   Creatinine, Ser 0.49 0.44 - 1.00 mg/dL   Calcium 9.1 8.9 - 10.3 mg/dL   Total Protein 6.4 (L) 6.5 - 8.1 g/dL   Albumin 3.0 (L) 3.5 - 5.0 g/dL   AST 17 15 - 41 U/L   ALT 12 (L) 14 - 54 U/L   Alkaline Phosphatase 86 38 - 126 U/L   Total Bilirubin 0.6 0.3 - 1.2 mg/dL   GFR calc non Af Amer >60 >60 mL/min   GFR calc Af Amer >60 >60 mL/min   Anion gap 4  (L) 5 - 15  Uric acid     Status: None   Collection Time: 10/12/14  3:00 PM  Result Value Ref Range   Uric Acid, Serum 4.6 2.3 - 6.6 mg/dL  Protein / creatinine ratio, urine     Status: None   Collection Time: 10/12/14  3:00 PM  Result Value Ref Range   Creatinine, Urine 102.00 mg/dL   Total Protein, Urine 10 mg/dL   Protein Creatinine Ratio 0.10 0.00 - 0.15 mg/mg[Cre]   Continue pitocin.  Labs showed not signs of severe preeclampsia.

## 2014-10-12 NOTE — Progress Notes (Signed)
Pt's IV access on arm infiltrated and caused swelling of arm.  Dr. Jillyn Hidden just placed central line for access since there is no other perfipheral access available.  Pt to get epidural after.  Continue Pit.

## 2014-10-12 NOTE — Progress Notes (Signed)
Office labs still pending.   Will repeat [place] labs today with Pr:Cr ratio.

## 2014-10-12 NOTE — Progress Notes (Signed)
Anesthesia at bedside for central line placement

## 2014-10-12 NOTE — Anesthesia Procedure Notes (Signed)
Epidural Patient location during procedure: OB  Staffing Anesthesiologist: Kirby Argueta Performed by: anesthesiologist   Preanesthetic Checklist Completed: patient identified, site marked, surgical consent, pre-op evaluation, timeout performed, IV checked, risks and benefits discussed and monitors and equipment checked  Epidural Patient position: sitting Prep: ChloraPrep, site prepped and draped and Chloraprep x 2 Patient monitoring: continuous pulse ox and blood pressure Approach: midline Location: L3-L4 Injection technique: LOR saline  Needle:  Needle type: Tuohy  Needle gauge: 17 G Needle length: 9 cm and 9 Needle insertion depth: 7 cm Catheter type: closed end flexible Catheter size: 19 Gauge Catheter at skin depth: 14 cm Test dose: negative  Assessment Events: blood not aspirated, injection not painful, no injection resistance, negative IV test and no paresthesia  Additional Notes Patient identified. Risks/Benefits/Options discussed with patient including but not limited to bleeding, infection, nerve damage, paralysis, failed block, incomplete pain control, headache, blood pressure changes, nausea, vomiting, reactions to medication both or allergic, itching and postpartum back pain. Confirmed with bedside nurse the patient's most recent platelet count. Confirmed with patient that they are not currently taking any anticoagulation, have any bleeding history or any family history of bleeding disorders. Patient expressed understanding and wished to proceed. All questions were answered. Sterile technique was used throughout the entire procedure. Please see nursing notes for vital signs. Test dose was given through epidural catheter and negative prior to continuing to dose epidural or start infusion. Warning signs of high block given to the patient including shortness of breath, tingling/numbness in hands, complete motor block, or any concerning symptoms with instructions to call for  help. Patient was given instructions on fall risk and not to get out of bed. All questions and concerns addressed with instructions to call with any issues or inadequate analgesia.

## 2014-10-12 NOTE — Progress Notes (Signed)
Dr. Philis Pique notified of pt IV that had infiltrated in left [facility]. Warm blankets applied and will continue to monitor site. Dr. Jillyn Hidden advised of situation and request for IV access. Dr. Jillyn Hidden spoke with Dr. Philis Pique regarding possible need for central line placement.

## 2014-10-12 NOTE — Anesthesia Procedure Notes (Signed)
Anesthesia Procedure Note R Internal Jugular Central Line placed using ultrasound guidance. Sterile hat, mask, gloves, gown, and drape utilized during procedure. Chlorprep x2. Placed in patients labor room as she is in active labor and needs continuous monitoring. Indication for line was poor peripheral access. 8Fr double lumen, 16cm catheter placed. Ultrasound used during entire placement. See progress note on chart for image. Patient tolerated procedure well. See LDA note for further details. CXR obtained and ok for use.

## 2014-10-12 NOTE — Anesthesia Preprocedure Evaluation (Addendum)
Anesthesia Evaluation  Patient identified by MRN, date of birth, ID band Patient awake    Reviewed: Allergy & Precautions, NPO status , Patient's Chart, lab work & pertinent test results  History of Anesthesia Complications Negative for: history of anesthetic complications  Airway Mallampati: III  TM Distance: >3 FB Neck ROM: Full    Dental no notable dental hx.    Pulmonary asthma ,  breath sounds clear to auscultation  Pulmonary exam normal       Cardiovascular hypertension (PreE), Pt. on medications Normal cardiovascular examRhythm:Regular Rate:Normal     Neuro/Psych negative neurological ROS  negative psych ROS   GI/Hepatic negative GI ROS, Neg liver ROS,   Endo/Other  Morbid obesity  Renal/GU negative Renal ROS  negative genitourinary   Musculoskeletal negative musculoskeletal ROS (+)   Abdominal   Peds negative pediatric ROS (+)  Hematology negative hematology ROS (+)   Anesthesia Other Findings   Reproductive/Obstetrics (+) Pregnancy                            Anesthesia Physical Anesthesia Plan  ASA: III  Anesthesia Plan: Epidural   Post-op Pain Management:    Induction:   Airway Management Planned:   Additional Equipment:   Intra-op Plan:   Post-operative Plan:   Informed Consent: I have reviewed the patients History and Physical, chart, labs and discussed the procedure including the risks, benefits and alternatives for the proposed anesthesia with the patient or authorized representative who has indicated his/her understanding and acceptance.   Dental advisory given  Plan Discussed with: Anesthesiologist  Anesthesia Plan Comments:         Anesthesia Quick Evaluation

## 2014-10-13 ENCOUNTER — Encounter (HOSPITAL_COMMUNITY): Payer: Self-pay | Admitting: *Deleted

## 2014-10-13 LAB — CBC
HCT: 30.5 % — ABNORMAL LOW (ref 36.0–46.0)
Hemoglobin: 10.3 g/dL — ABNORMAL LOW (ref 12.0–15.0)
MCH: 27.5 pg (ref 26.0–34.0)
MCHC: 33.8 g/dL (ref 30.0–36.0)
MCV: 81.3 fL (ref 78.0–100.0)
PLATELETS: 158 10*3/uL (ref 150–400)
RBC: 3.75 MIL/uL — ABNORMAL LOW (ref 3.87–5.11)
RDW: 14.1 % (ref 11.5–15.5)
WBC: 8.8 10*3/uL (ref 4.0–10.5)

## 2014-10-13 MED ORDER — LORATADINE 10 MG PO TABS
10.0000 mg | ORAL_TABLET | Freq: Every day | ORAL | Status: DC
  Administered 2014-10-13: 10 mg via ORAL
  Filled 2014-10-13: qty 1

## 2014-10-13 NOTE — Progress Notes (Addendum)
Baby not yet seen by Peds and baby is having some trouble with latching.  Will defer circ until tomorrow.  ORDERS FOR CIRC already in.  MMR to mom.  OK to remove IJ catheter- pt stable since delivery.

## 2014-10-13 NOTE — Progress Notes (Signed)
I called Dr. Ermalene Postin to confirm order - "repeat CBC" in sticky note, but no order or instructions seen in anesthesia note.  Per Dr. Ermalene Postin, ok to remove epidural catheter now based on most recent labs.  Cristal Generous RN

## 2014-10-13 NOTE — Progress Notes (Signed)
Patient is eating, ambulating, voiding.  Pain control is good.  Filed Vitals:   10/12/14 2345 10/13/14 0031 10/13/14 0110 10/13/14 0210  BP: 131/95 137/79 139/68 121/66  Pulse: 104 102 86 84  Temp:   98.9 F (37.2 C) 98.4 F (36.9 C)  TempSrc:   Oral Oral  Resp: 18 18 20 20   Height:      Weight:      SpO2:   97% 98%    Fundus firm Perineum without swelling.  Lab Results  Component Value Date   WBC 8.8 10/13/2014   HGB 10.3* 10/13/2014   HCT 30.5* 10/13/2014   MCV 81.3 10/13/2014   PLT 158 10/13/2014    --/--/B POS (07/01 1851)/R equiv  A/P Post partum day 1.  Routine care.  Expect d/c routine.   Parents desires circumsision.  All risks, benefits and alternatives discussed with the mother.  Sibyl Mikula A

## 2014-10-13 NOTE — Progress Notes (Signed)
R) IJ Central line removed per MD order and pt request. Pt was positioned flat in bed and instructed to remain flat for an additional 30 minutes.  Catheter removed without incident, Vaseline guaze placed over the site and secured with hypafix. Site WDL. Instructed pt to call out if she had questions or became symptomatic. Discussed POC with pt's RN.

## 2014-10-13 NOTE — Anesthesia Postprocedure Evaluation (Signed)
  Anesthesia Post-op Note  Patient: Sydney Bradley  Procedure(s) Performed: * No procedures listed *  Patient Location: Mother/Baby  Anesthesia Type:Epidural  Level of Consciousness: awake and alert   Airway and Oxygen Therapy: Patient Spontanous Breathing  Post-op Pain: mild  Post-op Assessment: Post-op Vital signs reviewed, Patient's Cardiovascular Status Stable, Respiratory Function Stable, No signs of Nausea or vomiting, Pain level controlled, No headache, Spinal receding and Patient able to bend at knees              Post-op Vital Signs: Reviewed  Last Vitals:  Filed Vitals:   10/13/14 0210  BP: 121/66  Pulse: 84  Temp: 36.9 C  Resp: 20    Complications: No apparent anesthesia complications

## 2014-10-13 NOTE — Lactation Note (Addendum)
This note was copied from the chart of [place]. Lactation Consultation Note  Patient Name: Sydney Bradley QHKUV'J Date: 10/13/2014 Reason for consult: Initial assessment  Per mom had breast changes  Baby is 45 hours old , later pre term , 6-3.1 oz, attempts at the breast , and has been supplemented  Per LPT feeding policy - 5-8 ml with formula , 4 wets , 2 mec stools, Latch scores - 5-6  0% weight loss. @ this consult . Baby sleepy at 1st, unwrapped from 2 blankets and T - shirt. LC showed mom waking techniques. Baby sucks on a glove finger after several attempts, and noted to be chewy at 1st. LC attempted to latch with out  Success, fed the baby supplement , then tried to latch without success, and placed baby skin to skin with mom. After skin to skin , mom plans to post pump both breast , and save milk. DEBP had already been set up. Per mom  has pumped x2 with out results. Reassured mom it's normal and slow process, add pre hand expressing , post hand expressing.  Mom aware of the LPT policy and already has a print out for moms. LC increased volume of supplement due to hours old.  Baby tolerated well.  Mother informed of post-discharge support and given phone number to the lactation department, including services for phone call assistance; out-patient appointments; and breastfeeding support group. List of other breastfeeding resources in the community given in the handout. Encouraged mother to call for problems or concerns related to breastfeeding.   Maternal Data Has patient been taught Hand Expression?: Yes Does the patient have breastfeeding experience prior to this delivery?: No  Feeding Feeding Type: Breast Fed Nipple Type: Slow - flow  LATCH Score/Interventions Latch: Too sleepy or reluctant, no latch achieved, no sucking elicited. Intervention(s): Skin to skin;Teach feeding cues;Waking techniques Intervention(s): Adjust position;Assist with latch;Breast massage;Breast  compression  Audible Swallowing: None  Type of Nipple: Everted at rest and after stimulation  Comfort (Breast/Nipple): Soft / non-tender     Hold (Positioning): Assistance needed to correctly position infant at breast and maintain latch. Intervention(s): Breastfeeding basics reviewed  LATCH Score: 5  Lactation Tools Discussed/Used Tools: Pump Breast pump type: Double-Electric Breast Pump WIC Program: No   Consult Status Consult Status: Follow-up Date: 10/14/14 Follow-up type: In-patient    Myer Haff 10/13/2014, 5:37 PM

## 2014-10-14 ENCOUNTER — Ambulatory Visit: Payer: Self-pay

## 2014-10-14 MED ORDER — OXYCODONE-ACETAMINOPHEN 5-325 MG PO TABS: 2.0000 | ORAL_TABLET | ORAL | Status: DC | PRN

## 2014-10-14 MED ORDER — LABETALOL HCL 200 MG PO TABS: 200.0000 mg | ORAL_TABLET | Freq: Two times a day (BID) | ORAL | Status: DC

## 2014-10-14 MED ORDER — DOCUSATE SODIUM 100 MG PO CAPS: 100.0000 mg | ORAL_CAPSULE | Freq: Two times a day (BID) | ORAL | Status: DC

## 2014-10-14 MED ORDER — IBUPROFEN 800 MG PO TABS: 800.0000 mg | ORAL_TABLET | Freq: Three times a day (TID) | ORAL | Status: DC | PRN

## 2014-10-14 MED ORDER — IBUPROFEN 600 MG PO TABS
600.0000 mg | ORAL_TABLET | Freq: Four times a day (QID) | ORAL | Status: DC | PRN
Start: 2014-10-14 — End: 2014-12-18

## 2014-10-14 NOTE — Lactation Note (Signed)
This note was copied from the chart of [place]. Lactation Consultation Note  Patient Name: Sydney Bradley FMBWG'Y Date: 10/14/2014 Reason for consult: Follow-up assessment Baby 67 hours old. Mom states that she isn't sure she is going to be able to nurse, and that she may just need to pump and bottle-feed EBM. Offered to assist mom with latching. Mom stated that she was about to cry. FOB states that there were too many visitors today and that baby was circumcised this morning. Enc FOB to limit visitors if it interferes with feeding and caring for baby. Assisted mom to latch baby in football position to right breast. Demonstrated to mom how to hand express, no colostrum visible. Also showed mom how to support her breast with a rolled wash cloth. Enc mom to hold nipple in a "teacup" hold and keep holding as baby latches. Baby latch deeply, suckling rhythmically, with a few swallows noted. Baby maintained a deep latch and suckled rhythmically for 10 minutes. Mom states that she feels like crying because she is so happy. When baby unlatches, mom has colostrum at nipple. Enc FOB to supplement baby according to supplementation guidelines while mom pumps.   Mom requests that this [facility] write down feeding plan, which was done and given to mom. Plan is for mom to put baby to breast with cues and at least every 3 hours. Then, supplement baby according to guidelines with EBM/formula. Then mom is to pump for 15 minutes after each breast feed. Enc FOB to supplement while mom pumps. Discussed supply and demand and reviewed LPI behavior and need to limit total feed time to 30 minutes. Parents aware of the need to follow baby's weight gain with pediatrician, and [employer] OP/BFSG and [place] phone line assistance after D/C. Enc mom to call insurance about getting DEBP and she is aware of [place] 2-week DEBP rental program.  Maternal Data    Feeding Feeding Type: Breast Fed Length of feed: 10 min  LATCH Score/Interventions Latch:  Grasps breast easily, tongue down, lips flanged, rhythmical sucking. Intervention(s): Skin to skin Intervention(s): Adjust position;Assist with latch;Breast compression  Audible Swallowing: A few with stimulation Intervention(s): Skin to skin;Hand expression  Type of Nipple: Everted at rest and after stimulation  Comfort (Breast/Nipple): Soft / non-tender     Hold (Positioning): Assistance needed to correctly position infant at breast and maintain latch. Intervention(s): Breastfeeding basics reviewed;Support Pillows;Skin to skin;Position options  LATCH Score: 8  Lactation Tools Discussed/Used     Consult Status Consult Status: Follow-up Date: 10/15/14 Follow-up type: In-patient    Inocente Salles 10/14/2014, 8:11 PM

## 2014-10-14 NOTE — Progress Notes (Signed)
Post Partum Day 2 Subjective: Pain controlled, notes perineal discomfort. Ambulating well. Tolerating po. Voiding without difficulty. Breast and bottle feeding   Objective: Blood pressure 124/66, pulse 96, temperature 98 F (36.7 C), temperature source Oral, resp. rate 19, height 5' 9"  (1.753 m), weight 154.223 kg (340 lb), last menstrual period 01/26/2014, SpO2 100 %, unknown if currently breastfeeding.  Physical Exam:  General: alert, cooperative and appears stated age Lochia: appropriate Uterine Fundus: firm   Recent Labs  10/12/14 1130 10/13/14 0832  HGB 11.7* 10.3*  HCT 34.0* 30.5*    Assessment/Plan: Discharge home, Breastfeeding and Circumcision prior to discharge  Desire neonatal circumcision. Risks, benefits, alternatives reviewed. Patient wishes to proceed Baby will not be discharged d/t poor feeding. Will plan to discharge mom and make patient a baby patient    LOS: 3 days   Torres Hardenbrook H. 10/14/2014, 10:28 AM

## 2014-10-14 NOTE — Discharge Summary (Signed)
Obstetric Discharge Summary Reason for Admission: induction of labor Prenatal Procedures: NST and ultrasound Intrapartum Procedures: spontaneous vaginal delivery Postpartum Procedures: none Complications-Operative and Postpartum: 2nd degree perineal laceration HEMOGLOBIN  Date Value Ref Range Status  10/13/2014 10.3* 12.0 - 15.0 g/dL Final   HCT  Date Value Ref Range Status  10/13/2014 30.5* 36.0 - 46.0 % Final    Physical Exam:  General: alert, cooperative and appears stated age 34: appropriate Uterine Fundus: firm Incision: healing well DVT Evaluation: No evidence of DVT seen on physical exam.  Discharge Diagnoses: Chronic hypertension with super-imposed pre-eclampsia,   Discharge Information: Date: 10/14/2014 Activity: pelvic rest Diet: routine Medications: Ibuprofen, Colace, Percocet and labetalol Condition: improved Instructions: refer to practice specific booklet Discharge to: home Follow-up Information    Follow up with HORVATH,MICHELLE A, MD In 1 week.   Specialty:  Obstetrics and Gynecology   Why:  for a blood pressure check   Contact information:   [address] [place] [address] [phone]       Newborn Data: Live born female  Birth Weight: 6 lb 3.1 oz (2810 g) APGAR: 8, 9  Home with mother.  Mayleigh Tetrault H. 10/14/2014, 10:34 AM

## 2014-10-15 ENCOUNTER — Ambulatory Visit: Payer: Self-pay

## 2014-10-15 NOTE — Lactation Note (Signed)
This note was copied from the chart of [place]. Lactation Consultation Note: Mother states that she is still having difficult latch. Assist mother with hand expression. Observed a few drops of colostrum. Assist mother with latching infant. Infant sustained latch for 15 mins. Observed intermittent swallows. Mother very excited that infant latched well. She has an Environmental manager pump at home . She plans to post pump every 2-3 hours . She has a nipple shield if needed. Mother is to supplement with formula until milk comes to volume. Mother was offered a follow up with [facility] services. She states she will phone as needed.   Patient Name: Sydney Bradley RMBOB'O Date: 10/15/2014     Maternal Data    Feeding    LATCH Score/Interventions                      Lactation Tools Discussed/Used     Consult Status      Darla Lesches 10/15/2014, 4:09 PM

## 2014-12-18 ENCOUNTER — Encounter: Payer: Self-pay | Admitting: Family Medicine

## 2014-12-18 ENCOUNTER — Ambulatory Visit (INDEPENDENT_AMBULATORY_CARE_PROVIDER_SITE_OTHER): Payer: 59 | Admitting: Family Medicine

## 2014-12-18 VITALS — BP 102/80 | HR 94 | Temp 99.2°F | Ht 69.0 in | Wt 321.2 lb

## 2014-12-18 DIAGNOSIS — J069 Acute upper respiratory infection, unspecified: Secondary | ICD-10-CM | POA: Diagnosis not present

## 2014-12-18 NOTE — Progress Notes (Signed)
Pre visit review using our clinic review tool, if applicable. No additional management support is needed unless otherwise documented below in the visit note. 

## 2014-12-18 NOTE — Progress Notes (Signed)
HPI:  URI: -started: 3 days ago -symptoms:nasal congestion, PND, cough, PND, ear pressure -denies:fever, SOB, NVD, tooth pain, sinus pain -has tried: nothing, breastfeeding -sick contacts/travel/risks: denies flu exposure, tick exposure or or Ebola risks, flu exposure -Hx of: allergies, asthma - has not required any albuterol or had any asthma symptoms with this   ROS: See pertinent positives and negatives per HPI.  Past Medical History  Diagnosis Date  . Hypertension     no meds x 3 yrs  . Arthritis     L5 Back - no meds - otc prn  . IBS (irritable bowel syndrome)   . Asthma   . Sacral fracture     Past Surgical History  Procedure Laterality Date  . Tonsillectomy  2007  . Tonsillectomy and adenoidectomy  2007  . Dilation and evacuation  02/25/2012    Procedure: DILATATION AND EVACUATION;  Surgeon: Daria Pastures, MD;  Location: [facility];  Service: Gynecology;  Laterality: N/A;  . Dilation and evacuation N/A 03/01/2013    Procedure: DILATATION AND EVACUATION;  Surgeon: Olga Millers, MD;  Location: [facility];  Service: Gynecology;  Laterality: N/A;    No family history on file.  Social History   Social History  . Marital Status: Married    Spouse Name: N/A  . Number of Children: N/A  . Years of Education: N/A   Social History Main Topics  . Smoking status: Never Smoker   . Smokeless tobacco: Never Used  . Alcohol Use: No  . Drug Use: No  . Sexual Activity: Yes    Birth Control/ Protection: None     Comment: approx [redacted]wks gestation   Other Topics Concern  . None   Social History Narrative     Current outpatient prescriptions:  .  albuterol (PROVENTIL HFA;VENTOLIN HFA) 108 (90 BASE) MCG/ACT inhaler, Inhale 2 puffs into the lungs every 4 (four) hours as needed for wheezing., Disp: 1 Inhaler, Rfl: 0 .  cetirizine (ZYRTEC) 10 MG tablet, Take 10 mg by mouth., Disp: , Rfl:  .  DiphenhydrAMINE HCl, Sleep, 50 MG tablet, Take 50 mg by mouth., Disp: , Rfl:  .   docusate sodium (COLACE) 100 MG capsule, Take 1 capsule (100 mg total) by mouth 2 (two) times daily., Disp: 60 capsule, Rfl: 0 .  fluticasone (FLONASE) 50 MCG/ACT nasal spray, Place 2 sprays into both nostrils daily., Disp: 16 g, Rfl: 6 .  ibuprofen (ADVIL,MOTRIN) 800 MG tablet, Take 1 tablet (800 mg total) by mouth every 8 (eight) hours as needed., Disp: 90 tablet, Rfl: 0 .  labetalol (NORMODYNE) 200 MG tablet, Take 1 tablet (200 mg total) by mouth 2 (two) times daily., Disp: 60 tablet, Rfl: 1 .  oxyCODONE-acetaminophen (ROXICET) 5-325 MG per tablet, Take 2 tablets by mouth every 4 (four) hours as needed. May take 1-2 tablets every 4-6 hours as needed for pain, Disp: 30 tablet, Rfl: 0 .  Prenatal Vit-Fe Fumarate-FA (PRENATAL MULTIVITAMIN) TABS tablet, Take 1 tablet by mouth daily at 12 noon., Disp: , Rfl:  .  ranitidine (ZANTAC) 150 MG tablet, Take 150 mg by mouth daily. , Disp: , Rfl:   EXAM:  Filed Vitals:   12/18/14 0922  BP: 102/80  Pulse: 94  Temp: 99.2 F (37.3 C)    Body mass index is 47.41 kg/(m^2).  GENERAL: vitals reviewed and listed above, alert, oriented, appears well hydrated and in no acute distress  HEENT: atraumatic, conjunttiva clear, no obvious abnormalities on inspection of external nose  and ears, normal appearance of ear canals and TMs, clear nasal congestion, mild post oropharyngeal erythema with PND, no tonsillar edema or exudate, no sinus TTP  NECK: no obvious masses on inspection  LUNGS: clear to auscultation bilaterally, no wheezes, rales or rhonchi, good air movement  CV: HRRR, no peripheral edema  MS: moves all extremities without noticeable abnormality  PSYCH: pleasant and cooperative, no obvious depression or anxiety  ASSESSMENT AND PLAN:  Discussed the following assessment and plan:  Upper respiratory infection  -given HPI and exam findings today, a serious infection or illness is unlikely. We discussed potential etiologies, with VURI being most  likely, and advised supportive care and monitoring. We discussed treatment side effects, likely course, antibiotic misuse, transmission, and signs of developing a serious illness. -of course, we advised to return or notify a doctor immediately if symptoms worsen or persist or new concerns arise.    Patient Instructions  INSTRUCTIONS FOR UPPER RESPIRATORY INFECTION:  -plenty of rest and fluids  -nasal saline wash 2-3 times daily (use prepackaged nasal saline or bottled/distilled water if making your own)   -can use tylenol as directed for aches and sorethroat  -in the winter time, using a humidifier at night is helpful (please follow cleaning instructions)  -for sore throat, salt water gargles can help  -follow up if you have fevers, facial pain, tooth pain, difficulty breathing or are worsening or symptoms persist longer then expected  Upper Respiratory Infection, Adult An upper respiratory infection (URI) is also known as the common cold. It is often caused by a type of germ (virus). Colds are easily spread (contagious). You can pass it to others by kissing, coughing, sneezing, or drinking out of the same glass. Usually, you get better in 1 to 3  weeks.  However, the cough can last for even longer. HOME CARE   Only take medicine as told by your doctor. Follow instructions provided above.  Drink enough water and fluids to keep your pee (urine) clear or pale yellow.  Get plenty of rest.  Return to work when your temperature is < 100 for 24 hours or as told by your doctor. You may use a face mask and wash your hands to stop your cold from spreading. GET HELP RIGHT AWAY IF:   After the first few days, you feel you are getting worse.  You have questions about your medicine.  You have chills, shortness of breath, or red spit (mucus).  You have pain in the face for more then 1-2 days, especially when you bend forward.  You have a fever, puffy (swollen) neck, pain when you swallow,  or white spots in the back of your throat.  You have a bad headache, ear pain, sinus pain, or chest pain.  You have a high-pitched whistling sound when you breathe in and out (wheezing).  You cough up blood.  You have sore muscles or a stiff neck. MAKE SURE YOU:   Understand these instructions.  Will watch your condition.  Will get help right away if you are not doing well or get worse. Document Released: 09/15/2007 Document Revised: 06/21/2011 Document Reviewed: 07/04/2013 [facility] Patient Information 2015 [place], [place]. This information is not intended to replace advice given to you by your health care provider. Make sure you discuss any questions you have with your health care provider.      Colin Benton R.

## 2014-12-18 NOTE — Patient Instructions (Addendum)
BEFORE YOU LEAVE: -please set up new patient visit with Tommi Rumps or Dr. Yong Channel    INSTRUCTIONS FOR UPPER RESPIRATORY INFECTION:  -plenty of rest and fluids  -nasal saline wash 2-3 times daily (use prepackaged nasal saline or bottled/distilled water if making your own)   -can use tylenol as directed for aches and sorethroat  -in the winter time, using a humidifier at night is helpful (please follow cleaning instructions)  -for sore throat, salt water gargles can help  -follow up if you have fevers, facial pain, tooth pain, difficulty breathing or are worsening or symptoms persist longer then expected  Upper Respiratory Infection, Adult An upper respiratory infection (URI) is also known as the common cold. It is often caused by a type of germ (virus). Colds are easily spread (contagious). You can pass it to others by kissing, coughing, sneezing, or drinking out of the same glass. Usually, you get better in 1 to 3  weeks.  However, the cough can last for even longer. HOME CARE   Only take medicine as told by your doctor. Follow instructions provided above.  Drink enough water and fluids to keep your pee (urine) clear or pale yellow.  Get plenty of rest.  Return to work when your temperature is < 100 for 24 hours or as told by your doctor. You may use a face mask and wash your hands to stop your cold from spreading. GET HELP RIGHT AWAY IF:   After the first few days, you feel you are getting worse.  You have questions about your medicine.  You have chills, shortness of breath, or red spit (mucus).  You have pain in the face for more then 1-2 days, especially when you bend forward.  You have a fever, puffy (swollen) neck, pain when you swallow, or white spots in the back of your throat.  You have a bad headache, ear pain, sinus pain, or chest pain.  You have a high-pitched whistling sound when you breathe in and out (wheezing).  You cough up blood.  You have sore muscles or a  stiff neck. MAKE SURE YOU:   Understand these instructions.  Will watch your condition.  Will get help right away if you are not doing well or get worse. Document Released: 09/15/2007 Document Revised: 06/21/2011 Document Reviewed: 07/04/2013 [facility] Patient Information 2015 [place], [place]. This information is not intended to replace advice given to you by your health care provider. Make sure you discuss any questions you have with your health care provider.

## 2014-12-21 ENCOUNTER — Emergency Department (HOSPITAL_COMMUNITY)
Admission: EM | Admit: 2014-12-21 | Discharge: 2014-12-22 | Disposition: A | Discharge status: Home / Self Care | Payer: 59 | Attending: Emergency Medicine | Admitting: Emergency Medicine

## 2014-12-21 DIAGNOSIS — R059 Cough, unspecified: Secondary | ICD-10-CM

## 2014-12-21 DIAGNOSIS — J0110 Acute frontal sinusitis, unspecified: Secondary | ICD-10-CM | POA: Insufficient documentation

## 2014-12-21 DIAGNOSIS — Z880 Allergy status to penicillin: Secondary | ICD-10-CM | POA: Insufficient documentation

## 2014-12-21 DIAGNOSIS — Z87828 Personal history of other (healed) physical injury and trauma: Secondary | ICD-10-CM | POA: Insufficient documentation

## 2014-12-21 DIAGNOSIS — R05 Cough: Secondary | ICD-10-CM | POA: Diagnosis present

## 2014-12-21 DIAGNOSIS — Z91040 Latex allergy status: Secondary | ICD-10-CM | POA: Insufficient documentation

## 2014-12-21 DIAGNOSIS — I10 Essential (primary) hypertension: Secondary | ICD-10-CM | POA: Insufficient documentation

## 2014-12-21 DIAGNOSIS — K589 Irritable bowel syndrome without diarrhea: Secondary | ICD-10-CM | POA: Diagnosis not present

## 2014-12-21 DIAGNOSIS — M1990 Unspecified osteoarthritis, unspecified site: Secondary | ICD-10-CM | POA: Insufficient documentation

## 2014-12-21 DIAGNOSIS — R498 Other voice and resonance disorders: Secondary | ICD-10-CM | POA: Diagnosis not present

## 2014-12-21 DIAGNOSIS — J45901 Unspecified asthma with (acute) exacerbation: Secondary | ICD-10-CM | POA: Insufficient documentation

## 2014-12-21 DIAGNOSIS — Z79899 Other long term (current) drug therapy: Secondary | ICD-10-CM | POA: Diagnosis not present

## 2014-12-21 DIAGNOSIS — Z7951 Long term (current) use of inhaled steroids: Secondary | ICD-10-CM | POA: Diagnosis not present

## 2014-12-21 DIAGNOSIS — R490 Dysphonia: Secondary | ICD-10-CM

## 2014-12-21 NOTE — ED Notes (Signed)
Pt from home c/o shortness of breath and cough x 1 week. Clear lung sounds bilaterally. She has been using inhaler without relief.

## 2014-12-22 ENCOUNTER — Encounter (HOSPITAL_COMMUNITY): Payer: Self-pay | Admitting: Emergency Medicine

## 2014-12-22 ENCOUNTER — Emergency Department (HOSPITAL_COMMUNITY): Payer: 59

## 2014-12-22 DIAGNOSIS — R05 Cough: Secondary | ICD-10-CM | POA: Diagnosis not present

## 2014-12-22 MED ORDER — DEXTROMETHORPHAN-MENTHOL 5-5 MG MT LOZG: 1.0000 | LOZENGE | Freq: Two times a day (BID) | OROMUCOSAL | Status: DC

## 2014-12-22 MED ORDER — AZITHROMYCIN 250 MG PO TABS
500.0000 mg | ORAL_TABLET | Freq: Once | ORAL | Status: AC
  Administered 2014-12-22: 500 mg via ORAL
  Filled 2014-12-22: qty 2

## 2014-12-22 MED ORDER — AZITHROMYCIN 250 MG PO TABS: 250.0000 mg | ORAL_TABLET | Freq: Every day | ORAL | Status: DC

## 2014-12-22 MED ORDER — IPRATROPIUM BROMIDE 0.03 % NA SOLN
2.0000 | NASAL | Status: AC
  Administered 2014-12-22: 2 via NASAL
  Filled 2014-12-22: qty 30

## 2014-12-22 NOTE — ED Provider Notes (Signed)
CSN: [id number]     Arrival date & time 12/21/14  2341 History  This chart was scribed for Sydney Creamer, NP, working with Delora Fuel, MD by Julien Nordmann, ED Scribe. This patient was seen in room WA16/WA16 and the patient's care was started at 2:17 AM.    Chief Complaint  Patient presents with  . Asthma  . Cough      The history is provided by the patient. No language interpreter was used.   HPI Comments: Sydney Bradley is a 34 y.o. female who presents to the Emergency Department complaining of intermittent, gradual worsening coughing onset one week ago. Pt has an associated productive cough that brings up "cookie" like sputum. Pt went to the doctor on Tuesday and was dx with an URI. Pt is currently breast feeding and was told to only use her inhaler due to giving birth 9 weeks ago.   Past Medical History  Diagnosis Date  . Hypertension     no meds x 3 yrs  . Arthritis     L5 Back - no meds - otc prn  . IBS (irritable bowel syndrome)   . Asthma   . Sacral fracture    Past Surgical History  Procedure Laterality Date  . Tonsillectomy  2007  . Tonsillectomy and adenoidectomy  2007  . Dilation and evacuation  02/25/2012    Procedure: DILATATION AND EVACUATION;  Surgeon: Daria Pastures, MD;  Location: [facility];  Service: Gynecology;  Laterality: N/A;  . Dilation and evacuation N/A 03/01/2013    Procedure: DILATATION AND EVACUATION;  Surgeon: Olga Millers, MD;  Location: [facility];  Service: Gynecology;  Laterality: N/A;   No family history on file. Social History  Substance Use Topics  . Smoking status: Never Smoker   . Smokeless tobacco: Never Used  . Alcohol Use: No   OB History    Gravida Para Term Preterm AB TAB SAB Ectopic Multiple Living   3 1  1 2  2   0 1     Review of Systems  Respiratory: Positive for cough and shortness of breath.   All other systems reviewed and are negative.     Allergies  Latex; Peanut-containing drug products; Shellfish allergy;  Penicillins; Chocolate; Ciprofloxacin; Lamisil; and Wheat bran  Home Medications   Prior to Admission medications   Medication Sig Start Date End Date Taking? Authorizing Provider  albuterol (PROVENTIL HFA;VENTOLIN HFA) 108 (90 BASE) MCG/ACT inhaler Inhale 2 puffs into the lungs every 4 (four) hours as needed for wheezing. 11/04/12  Yes Linton Flemings, MD  cetirizine (ZYRTEC) 10 MG tablet Take 10 mg by mouth daily.    Yes Historical Provider, MD  Chlorphen-Pseudoephed-APAP (CORICIDIN D PO) Take 1 tablet by mouth every 6 (six) hours as needed (for cold).   Yes Historical Provider, MD  diphenhydrAMINE (BENADRYL) 25 MG tablet Take 25 mg by mouth every 6 (six) hours as needed for allergies.   Yes Historical Provider, MD  docusate sodium (COLACE) 100 MG capsule Take 1 capsule (100 mg total) by mouth 2 (two) times daily. Patient taking differently: Take 100 mg by mouth 2 (two) times daily as needed for mild constipation.  10/14/14  Yes Vanessa Kick, MD  fluticasone (FLONASE) 50 MCG/ACT nasal spray Place 2 sprays into both nostrils daily. Patient taking differently: Place 2 sprays into both nostrils daily as needed for allergies.  04/23/13  Yes Kennyth Arnold, FNP  ibuprofen (ADVIL,MOTRIN) 800 MG tablet Take 1 tablet (800 mg total) by  mouth every 8 (eight) hours as needed. 10/14/14  Yes Vanessa Kick, MD  labetalol (NORMODYNE) 200 MG tablet Take 1 tablet (200 mg total) by mouth 2 (two) times daily. 10/14/14  Yes Vanessa Kick, MD  Prenatal Vit-Fe Fumarate-FA (PRENATAL MULTIVITAMIN) TABS tablet Take 1 tablet by mouth daily at 12 noon.   Yes Historical Provider, MD  ranitidine (ZANTAC) 150 MG tablet Take 150 mg by mouth daily.    Yes Historical Provider, MD  azithromycin (ZITHROMAX Z-PAK) 250 MG tablet Take 1 tablet (250 mg total) by mouth daily. 12/23/14   Sydney Creamer, NP  Dextromethorphan-Menthol (DELSYM COUGH RELIEF) 5-5 MG LOZG Use as directed 1 lozenge in the mouth or throat 2 (two) times daily. 12/22/14   Sydney Creamer,  NP  oxyCODONE-acetaminophen (ROXICET) 5-325 MG per tablet Take 2 tablets by mouth every 4 (four) hours as needed. May take 1-2 tablets every 4-6 hours as needed for pain Patient not taking: Reported on 12/22/2014 10/14/14   Vanessa Kick, MD   Triage vitals: BP 123/83 mmHg  Pulse 79  Temp(Src) 98.2 F (36.8 C) (Oral)  Resp 18  SpO2 100% Physical Exam  Constitutional: She is oriented to person, place, and time. She appears well-developed and well-nourished.  HENT:  Head: Normocephalic.  Eyes: EOM are normal.  Neck: Normal range of motion.  Pulmonary/Chest: Effort normal.  Abdominal: She exhibits no distension.  Musculoskeletal: Normal range of motion.  Neurological: She is alert and oriented to person, place, and time.  Psychiatric: She has a normal mood and affect.  Nursing note and vitals reviewed.   ED Course  Procedures  DIAGNOSTIC STUDIES: Oxygen Saturation is 100% on RA, normal by my interpretation.  COORDINATION OF CARE:  2:19 AM Discussed treatment plan which includes chest x-ray with pt at bedside and pt agreed to plan.  Labs Review Labs Reviewed - No data to display  Imaging Review Dg Chest 2 View  12/22/2014   CLINICAL DATA:  Intermittent cough, onset 1 week ago and worsening.  EXAM: CHEST  2 VIEW  COMPARISON:  10/12/2014  FINDINGS: The heart size and mediastinal contours are within normal limits. Both lungs are clear. The visualized skeletal structures are unremarkable.  IMPRESSION: No active cardiopulmonary disease.   Electronically Signed   By: Lucienne Capers M.D.   On: 12/22/2014 03:45   I have personally reviewed and evaluated these images and lab results as part of my medical decision-making.   EKG Interpretation None    .  She was given Atrovent inhaler, which did help some of her nasal congestion.  She still coughing and having hoarseness should be started on azithromycin for sinus infection as well as given Delsym.  Throat wasn't/cough lozenges to use for  symptom relief  MDM   Final diagnoses:  Acute frontal sinusitis, recurrence not specified  Hoarseness  Cough      I personally performed the services described in this documentation, which was scribed in my presence. The recorded information has been reviewed and is accurate.  Sydney Creamer, NP 18/56/31 4970  Delora Fuel, MD 26/37/85 8850

## 2014-12-22 NOTE — Discharge Instructions (Signed)
Cough, Adult  A cough is a reflex that helps clear your throat and airways. It can help heal the body or may be a reaction to an irritated airway. A cough may only last 2 or 3 weeks (acute) or may last more than 8 weeks (chronic).  CAUSES Acute cough:  Viral or bacterial infections. Chronic cough:  Infections.  Allergies.  Asthma.  Post-nasal drip.  Smoking.  Heartburn or acid reflux.  Some medicines.  Chronic lung problems (COPD).  Cancer. SYMPTOMS   Cough.  Fever.  Chest pain.  Increased breathing rate.  High-pitched whistling sound when breathing (wheezing).  Colored mucus that you cough up (sputum). TREATMENT   A bacterial cough may be treated with antibiotic medicine.  A viral cough must run its course and will not respond to antibiotics.  Your caregiver may recommend other treatments if you have a chronic cough. HOME CARE INSTRUCTIONS   Only take over-the-counter or prescription medicines for pain, discomfort, or fever as directed by your caregiver. Use cough suppressants only as directed by your caregiver.  Use a cold steam vaporizer or humidifier in your bedroom or home to help loosen secretions.  Sleep in a semi-upright position if your cough is worse at night.  Rest as needed.  Stop smoking if you smoke. SEEK IMMEDIATE MEDICAL CARE IF:   You have pus in your sputum.  Your cough starts to worsen.  You cannot control your cough with suppressants and are losing sleep.  You begin coughing up blood.  You have difficulty breathing.  You develop pain which is getting worse or is uncontrolled with medicine.  You have a fever. MAKE SURE YOU:   Understand these instructions.  Will watch your condition.  Will get help right away if you are not doing well or get worse. Document Released: 09/25/2010 Document Revised: 06/21/2011 Document Reviewed: 09/25/2010 Exit[employer] Patient Information 2015 [employer]. This information is not intended  to replace advice given to you by your health care provider. Make sure you discuss any questions you have with your health care provider.  

## 2015-01-27 ENCOUNTER — Telehealth: Payer: Self-pay | Admitting: Family

## 2015-01-27 NOTE — Telephone Encounter (Signed)
Pt has appointment 10/24 at 2 pm for transfer pt. However, she has taken the day off Friday Nov 4 to takke her son to the doc for shots. Late afternoon will be great if she can come in that day. No appt for that. Can I use late afternoon?  *(A new pt was scheduled for you (not by me) that same day in your 10:30 slot. I plan to move that pt.)

## 2015-01-27 NOTE — Telephone Encounter (Signed)
Yes , she would love the 3:30, I have left message for the 10:30 pt to call back and reschedule.  The 10:30 is not a new pt slot!! Many thanks!

## 2015-01-27 NOTE — Telephone Encounter (Signed)
That is fine with me, can we schedule her at 3 or 3:30?

## 2015-02-03 ENCOUNTER — Ambulatory Visit: Payer: 59 | Admitting: Adult Health

## 2015-02-14 ENCOUNTER — Encounter: Payer: Self-pay | Admitting: Adult Health

## 2015-02-14 ENCOUNTER — Ambulatory Visit (INDEPENDENT_AMBULATORY_CARE_PROVIDER_SITE_OTHER): Payer: 59 | Admitting: Adult Health

## 2015-02-14 VITALS — BP 130/98 | Temp 97.8°F | Ht 69.0 in | Wt 325.8 lb

## 2015-02-14 DIAGNOSIS — Z7189 Other specified counseling: Secondary | ICD-10-CM

## 2015-02-14 DIAGNOSIS — Z7689 Persons encountering health services in other specified circumstances: Secondary | ICD-10-CM

## 2015-02-14 MED ORDER — IBUPROFEN 600 MG PO TABS: 600.0000 mg | ORAL_TABLET | Freq: Three times a day (TID) | ORAL | Status: DC | PRN

## 2015-02-14 NOTE — Progress Notes (Signed)
Pre visit review using our clinic review tool, if applicable. No additional management support is needed unless otherwise documented below in the visit note. 

## 2015-02-14 NOTE — Patient Instructions (Addendum)
It was great meeting you today  Continue to work on diet and exercise  Follow up with me as needed.    Health Maintenance, Female Adopting a healthy lifestyle and getting preventive care can go a long way to promote health and wellness. Talk with your health care provider about what schedule of regular examinations is right for you. This is a good chance for you to check in with your provider about disease prevention and staying healthy. In between checkups, there are plenty of things you can do on your own. Experts have done a lot of research about which lifestyle changes and preventive measures are most likely to keep you healthy. Ask your health care provider for more information. WEIGHT AND DIET  Eat a healthy diet  Be sure to include plenty of vegetables, fruits, low-fat dairy products, and lean protein.  Do not eat a lot of foods high in solid fats, added sugars, or salt.  Get regular exercise. This is one of the most important things you can do for your health.  Most adults should exercise for at least 150 minutes each week. The exercise should increase your heart rate and make you sweat (moderate-intensity exercise).  Most adults should also do strengthening exercises at least twice a week. This is in addition to the moderate-intensity exercise.  Maintain a healthy weight  Body mass index (BMI) is a measurement that can be used to identify possible weight problems. It estimates body fat based on height and weight. Your health care provider can help determine your BMI and help you achieve or maintain a healthy weight.  For females 54 years of age and older:   A BMI below 18.5 is considered underweight.  A BMI of 18.5 to 24.9 is normal.  A BMI of 25 to 29.9 is considered overweight.  A BMI of 30 and above is considered obese.  Watch levels of cholesterol and blood lipids  You should start having your blood tested for lipids and cholesterol at 35 years of age, then have  this test every 5 years.  You may need to have your cholesterol levels checked more often if:  Your lipid or cholesterol levels are high.  You are older than 35 years of age.  You are at high risk for heart disease.  CANCER SCREENING   Lung Cancer  Lung cancer screening is recommended for adults 52-89 years old who are at high risk for lung cancer because of a history of smoking.  A yearly low-dose CT scan of the lungs is recommended for people who:  Currently smoke.  Have quit within the past 15 years.  Have at least a 30-pack-year history of smoking. A pack year is smoking an average of one pack of cigarettes a day for 1 year.  Yearly screening should continue until it has been 15 years since you quit.  Yearly screening should stop if you develop a health problem that would prevent you from having lung cancer treatment.  Breast Cancer  Practice breast self-awareness. This means understanding how your breasts normally appear and feel.  It also means doing regular breast self-exams. Let your health care provider know about any changes, no matter how small.  If you are in your 20s or 30s, you should have a clinical breast exam (CBE) by a health care provider every 1-3 years as part of a regular health exam.  If you are 35 or older, have a CBE every year. Also consider having a breast X-ray (mammogram)  every year.  If you have a family history of breast cancer, talk to your health care provider about genetic screening.  If you are at high risk for breast cancer, talk to your health care provider about having an MRI and a mammogram every year.  Breast cancer gene (BRCA) assessment is recommended for women who have family members with BRCA-related cancers. BRCA-related cancers include:  Breast.  Ovarian.  Tubal.  Peritoneal cancers.  Results of the assessment will determine the need for genetic counseling and BRCA1 and BRCA2 testing. Cervical Cancer Your health care  provider may recommend that you be screened regularly for cancer of the pelvic organs (ovaries, uterus, and vagina). This screening involves a pelvic examination, including checking for microscopic changes to the surface of your cervix (Pap test). You may be encouraged to have this screening done every 3 years, beginning at age 54.  For women ages 63-65, health care providers may recommend pelvic exams and Pap testing every 3 years, or they may recommend the Pap and pelvic exam, combined with testing for human papilloma virus (HPV), every 5 years. Some types of HPV increase your risk of cervical cancer. Testing for HPV may also be done on women of any age with unclear Pap test results.  Other health care providers may not recommend any screening for nonpregnant women who are considered low risk for pelvic cancer and who do not have symptoms. Ask your health care provider if a screening pelvic exam is right for you.  If you have had past treatment for cervical cancer or a condition that could lead to cancer, you need Pap tests and screening for cancer for at least 20 years after your treatment. If Pap tests have been discontinued, your risk factors (such as having a new sexual partner) need to be reassessed to determine if screening should resume. Some women have medical problems that increase the chance of getting cervical cancer. In these cases, your health care provider may recommend more frequent screening and Pap tests. Colorectal Cancer  This type of cancer can be detected and often prevented.  Routine colorectal cancer screening usually begins at 35 years of age and continues through 35 years of age.  Your health care provider may recommend screening at an earlier age if you have risk factors for colon cancer.  Your health care provider may also recommend using home test kits to check for hidden blood in the stool.  A small camera at the end of a tube can be used to examine your colon directly  (sigmoidoscopy or colonoscopy). This is done to check for the earliest forms of colorectal cancer.  Routine screening usually begins at age 43.  Direct examination of the colon should be repeated every 5-10 years through 35 years of age. However, you may need to be screened more often if early forms of precancerous polyps or small growths are found. Skin Cancer  Check your skin from head to toe regularly.  Tell your health care provider about any new moles or changes in moles, especially if there is a change in a mole's shape or color.  Also tell your health care provider if you have a mole that is larger than the size of a pencil eraser.  Always use sunscreen. Apply sunscreen liberally and repeatedly throughout the day.  Protect yourself by wearing long sleeves, pants, a wide-brimmed hat, and sunglasses whenever you are outside. HEART DISEASE, DIABETES, AND HIGH BLOOD PRESSURE   High blood pressure causes heart disease and  increases the risk of stroke. High blood pressure is more likely to develop in:  People who have blood pressure in the high end of the normal range (130-139/85-89 mm Hg).  People who are overweight or obese.  People who are African American.  If you are 42-42 years of age, have your blood pressure checked every 3-5 years. If you are 1 years of age or older, have your blood pressure checked every year. You should have your blood pressure measured twice--once when you are at a hospital or clinic, and once when you are not at a hospital or clinic. Record the average of the two measurements. To check your blood pressure when you are not at a hospital or clinic, you can use:  An automated blood pressure machine at a pharmacy.  A home blood pressure monitor.  If you are between 59 years and 47 years old, ask your health care provider if you should take aspirin to prevent strokes.  Have regular diabetes screenings. This involves taking a blood sample to check your  fasting blood sugar level.  If you are at a normal weight and have a low risk for diabetes, have this test once every three years after 35 years of age.  If you are overweight and have a high risk for diabetes, consider being tested at a younger age or more often. PREVENTING INFECTION  Hepatitis B  If you have a higher risk for hepatitis B, you should be screened for this virus. You are considered at high risk for hepatitis B if:  You were born in a country where hepatitis B is common. Ask your health care provider which countries are considered high risk.  Your parents were born in a high-risk country, and you have not been immunized against hepatitis B (hepatitis B vaccine).  You have HIV or AIDS.  You use needles to inject street drugs.  You live with someone who has hepatitis B.  You have had sex with someone who has hepatitis B.  You get hemodialysis treatment.  You take certain medicines for conditions, including cancer, organ transplantation, and autoimmune conditions. Hepatitis C  Blood testing is recommended for:  Everyone born from [birth year] through 1966.  Anyone with known risk factors for hepatitis C. Sexually transmitted infections (STIs)  You should be screened for sexually transmitted infections (STIs) including gonorrhea and chlamydia if:  You are sexually active and are younger than 35 years of age.  You are older than 35 years of age and your health care provider tells you that you are at risk for this type of infection.  Your sexual activity has changed since you were last screened and you are at an increased risk for chlamydia or gonorrhea. Ask your health care provider if you are at risk.  If you do not have HIV, but are at risk, it may be recommended that you take a prescription medicine daily to prevent HIV infection. This is called pre-exposure prophylaxis (PrEP). You are considered at risk if:  You are sexually active and do not regularly use condoms or  know the HIV status of your partner(s).  You take drugs by injection.  You are sexually active with a partner who has HIV. Talk with your health care provider about whether you are at high risk of being infected with HIV. If you choose to begin PrEP, you should first be tested for HIV. You should then be tested every 3 months for as long as you are taking PrEP.  PREGNANCY   If you are premenopausal and you may become pregnant, ask your health care provider about preconception counseling.  If you may become pregnant, take 400 to 800 micrograms (mcg) of folic acid every day.  If you want to prevent pregnancy, talk to your health care provider about birth control (contraception). OSTEOPOROSIS AND MENOPAUSE   Osteoporosis is a disease in which the bones lose minerals and strength with aging. This can result in serious bone fractures. Your risk for osteoporosis can be identified using a bone density scan.  If you are 31 years of age or older, or if you are at risk for osteoporosis and fractures, ask your health care provider if you should be screened.  Ask your health care provider whether you should take a calcium or vitamin D supplement to lower your risk for osteoporosis.  Menopause may have certain physical symptoms and risks.  Hormone replacement therapy may reduce some of these symptoms and risks. Talk to your health care provider about whether hormone replacement therapy is right for you.  HOME CARE INSTRUCTIONS   Schedule regular health, dental, and eye exams.  Stay current with your immunizations.   Do not use any tobacco products including cigarettes, chewing tobacco, or electronic cigarettes.  If you are pregnant, do not drink alcohol.  If you are breastfeeding, limit how much and how often you drink alcohol.  Limit alcohol intake to no more than 1 drink per day for nonpregnant women. One drink equals 12 ounces of beer, 5 ounces of wine, or 1 ounces of hard liquor.  Do  not use street drugs.  Do not share needles.  Ask your health care provider for help if you need support or information about quitting drugs.  Tell your health care provider if you often feel depressed.  Tell your health care provider if you have ever been abused or do not feel safe at home.   This information is not intended to replace advice given to you by your health care provider. Make sure you discuss any questions you have with your health care provider.   Document Released: 10/12/2010 Document Revised: 04/19/2014 Document Reviewed: 02/28/2013 Elsevier Interactive Patient Education [employer].

## 2015-02-14 NOTE — Progress Notes (Signed)
HPI:  Sydney Bradley is here to establish care. She is a pleasant AA female who just had a baby boy  Last PCP and physical: She has them done with GYN- she will continue to do blood work    Immunizations: Does not want flu Diet: Tries to eat healthy  Exercise:No regular exercise Pap Smear: Dec 2015   Has the following chronic problems that require follow up and concerns today:  HTN - This is due to preeclampsia with her youngest child. She is taking labetalol 290m BID  Seasonal Allergies - She has really bad seasonal allergies.has tried allergy shots but did not feel like they worked.    ROS negative for unless reported above: fevers, chills,feeling poorly, unintentional weight loss, hearing or vision loss, chest pain, palpitations, leg claudication, struggling to breath,Not feeling congested in the chest, no orthopenia, no cough,no wheezing, normal appetite, no soft tissue swelling, no hemoptysis, melena, hematochezia, hematuria, falls, loc, si, or thoughts of self harm.     Past Medical History  Diagnosis Date  . Hypertension     no meds x 3 yrs  . Arthritis     L5 Back - no meds - otc prn  . IBS (irritable bowel syndrome)   . Asthma   . Sacral fracture ([facility]     Past Surgical History  Procedure Laterality Date  . Tonsillectomy  2007  . Tonsillectomy and adenoidectomy  2007  . Dilation and evacuation  02/25/2012    Procedure: DILATATION AND EVACUATION;  Surgeon: MDaria Pastures MD;  [facility];  Service: Gynecology;  Laterality: N/A;  . Dilation and evacuation N/A 03/01/2013    Procedure: DILATATION AND EVACUATION;  Surgeon: MOlga Millers MD;  [facility];  Service: Gynecology;  Laterality: N/A;    No family history on file.  Social History   Social History  . Marital Status: Married    Spouse Name: N/A  . Number of Children: N/A  . Years of Education: N/A   Social History Main Topics  . Smoking status: Never Smoker   . Smokeless  tobacco: Never Used  . Alcohol Use: No  . Drug Use: No  . Sexual Activity: Yes    Birth Control/ Protection: None     Comment: approx [redacted]wks gestation   Other Topics Concern  . None   Social History Narrative     Current outpatient prescriptions:  .  albuterol (PROVENTIL HFA;VENTOLIN HFA) 108 (90 BASE) MCG/ACT inhaler, Inhale 2 puffs into the lungs every 4 (four) hours as needed for wheezing., Disp: 1 Inhaler, Rfl: 0 .  azithromycin (ZITHROMAX Z-PAK) 250 MG tablet, Take 1 tablet (250 mg total) by mouth daily., Disp: 4 tablet, Rfl: 0 .  cetirizine (ZYRTEC) 10 MG tablet, Take 10 mg by mouth daily. , Disp: , Rfl:  .  diphenhydrAMINE (BENADRYL) 25 MG tablet, Take 25 mg by mouth every 6 (six) hours as needed for allergies., Disp: , Rfl:  .  docusate sodium (COLACE) 100 MG capsule, Take 1 capsule (100 mg total) by mouth 2 (two) times daily. (Patient taking differently: Take 100 mg by mouth 2 (two) times daily as needed for mild constipation. ), Disp: 60 capsule, Rfl: 0 .  fluticasone (FLONASE) 50 MCG/ACT nasal spray, Place 2 sprays into both nostrils daily. (Patient taking differently: Place 2 sprays into both nostrils daily as needed for allergies. ), Disp: 16 g, Rfl: 6 .  ibuprofen (ADVIL,MOTRIN) 800 MG tablet, Take 1 tablet (800 mg total) by  mouth every 8 (eight) hours as needed., Disp: 90 tablet, Rfl: 0 .  labetalol (NORMODYNE) 200 MG tablet, Take 1 tablet (200 mg total) by mouth 2 (two) times daily., Disp: 60 tablet, Rfl: 1 .  Prenatal Vit-Fe Fumarate-FA (PRENATAL MULTIVITAMIN) TABS tablet, Take 1 tablet by mouth daily at 12 noon., Disp: , Rfl:  .  ranitidine (ZANTAC) 150 MG tablet, Take 150 mg by mouth daily. , Disp: , Rfl:   EXAM:  Filed Vitals:   02/14/15 1531  BP: 130/98  Temp: 97.8 F (36.6 C)    Body mass index is 48.09 kg/(m^2).  GENERAL: vitals reviewed and listed above, alert, oriented, appears well hydrated and in no acute distress  HEENT: atraumatic, conjunttiva clear,  no obvious abnormalities on inspection of external nose and ears  NECK: Neck is soft and supple without masses, no adenopathy or thyromegaly, trachea midline, no JVD. Normal range of motion.   LUNGS: clear to auscultation bilaterally, no wheezes, rales or rhonchi, good air movement  CV: Regular rate and rhythm, normal S1/S2, no audible murmurs, gallops, or rubs. No carotid bruit and no peripheral edema.   MS: moves all extremities without noticeable abnormality. No edema noted  Abd: soft/nontender/nondistended/normal bowel sounds. Obese   Skin: warm and dry, no rash   Extremities: No clubbing, cyanosis, or edema. Capillary refill is WNL. Pulses intact bilaterally in upper and lower extremities.   Neuro: CN II-XII intact, sensation and reflexes normal throughout, 5/5 muscle strength in bilateral upper and lower extremities. Normal finger to nose. Normal rapid alternating movements.    PSYCH: pleasant and cooperative, no obvious depression or anxiety  ASSESSMENT AND PLAN:  1. Encounter to establish care - You can continue to get your physicals at the GYN.  - Follow up as needed - Will consider phentermine after she stops breast feeding   -We reviewed the PMH, PSH, FH, SH, Meds and Allergies. -We provided refills for any medications we will prescribe as needed. -We addressed current concerns per orders and patient instructions. -We have asked for records for pertinent exams, studies, vaccines and notes from previous providers. -We have advised patient to follow up per instructions below.   -Patient advised to return or notify a provider immediately if symptoms worsen or persist or new concerns arise.    [name], AGNP

## 2015-03-14 ENCOUNTER — Ambulatory Visit (INDEPENDENT_AMBULATORY_CARE_PROVIDER_SITE_OTHER): Payer: 59 | Admitting: Family Medicine

## 2015-03-14 ENCOUNTER — Encounter: Payer: Self-pay | Admitting: Family Medicine

## 2015-03-14 VITALS — BP 118/80 | HR 99 | Temp 98.4°F | Ht 69.0 in | Wt 330.9 lb

## 2015-03-14 DIAGNOSIS — J4531 Mild persistent asthma with (acute) exacerbation: Secondary | ICD-10-CM

## 2015-03-14 DIAGNOSIS — J069 Acute upper respiratory infection, unspecified: Secondary | ICD-10-CM

## 2015-03-14 MED ORDER — PREDNISONE 10 MG PO TABS: ORAL_TABLET | ORAL | Status: DC

## 2015-03-14 MED ORDER — AZITHROMYCIN 250 MG PO TABS: ORAL_TABLET | ORAL | Status: DC

## 2015-03-14 NOTE — Progress Notes (Signed)
HPI:  URI -started: 1 week ago, not better -symptoms:nasal congestion, sore throat, cough, some wheezing and mild SOB, thick mucus production, not using alb, starting to get some sinus pain -denies:fever, NVD, tooth pain, body aches -has tried:  -sick contacts/travel/risks: denies flu exposure, tick exposure or or Ebola risks -Hx of: allergies, asthma, lactating, on OCP  ROS: See pertinent positives and negatives per HPI.  Past Medical History  Diagnosis Date  . Hypertension     no meds x 3 yrs  . Arthritis     L5 Back - no meds - otc prn  . IBS (irritable bowel syndrome)   . Asthma   . Sacral fracture [facility])     Past Surgical History  Procedure Laterality Date  . Tonsillectomy  2007  . Tonsillectomy and adenoidectomy  2007  . Dilation and evacuation  02/25/2012    Procedure: DILATATION AND EVACUATION;  Surgeon: Daria Pastures, MD;  Location: [facility];  Service: Gynecology;  Laterality: N/A;  . Dilation and evacuation N/A 03/01/2013    Procedure: DILATATION AND EVACUATION;  Surgeon: Olga Millers, MD;  Location: [facility];  Service: Gynecology;  Laterality: N/A;    Family History  Problem Relation Age of Onset  . COPD Maternal Grandmother   . Cancer Paternal Grandmother     blood cancer?  . Stroke Paternal Grandmother   . Dementia Maternal Grandmother   . Alzheimer's disease Maternal Grandfather     Social History   Social History  . Marital Status: Married    Spouse Name: N/A  . Number of Children: N/A  . Years of Education: N/A   Social History Main Topics  . Smoking status: Never Smoker   . Smokeless tobacco: Never Used  . Alcohol Use: No  . Drug Use: No  . Sexual Activity: Yes    Birth Control/ Protection: None   Other Topics Concern  . 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  albuterol (PROVENTIL HFA;VENTOLIN HFA) 108  (90 BASE) MCG/ACT inhaler, Inhale 2 puffs into the lungs every 4 (four) hours as needed for wheezing., Disp: 1 Inhaler, Rfl: 0 .  cetirizine (ZYRTEC) 10 MG tablet, Take 10 mg by mouth daily. , Disp: , Rfl:  .  diphenhydrAMINE (BENADRYL) 25 MG tablet, Take 25 mg by mouth every 6 (six) hours as needed for allergies., Disp: , Rfl:  .  docusate sodium (COLACE) 100 MG capsule, Take 1 capsule (100 mg total) by mouth 2 (two) times daily. (Patient taking differently: Take 100 mg by mouth 2 (two) times daily as needed for mild constipation. ), Disp: 60 capsule, Rfl: 0 .  fluticasone (FLONASE) 50 MCG/ACT nasal spray, Place 2 sprays into both nostrils daily. (Patient taking differently: Place 2 sprays into both nostrils daily as needed for allergies. ), Disp: 16 g, Rfl: 6 .  ibuprofen (ADVIL,MOTRIN) 600 MG tablet, Take 1 tablet (600 mg total) by mouth every 8 (eight) hours as needed., Disp: 30 tablet, Rfl: 6 .  labetalol (NORMODYNE) 200 MG tablet, Take 1 tablet (200 mg total) by mouth 2 (two) times daily., Disp: 60 tablet, Rfl: 1 .  Prenatal Vit-Fe Fumarate-FA (PRENATAL MULTIVITAMIN) TABS tablet, Take 1 tablet by mouth daily at 12 noon., Disp: , Rfl:  .  ranitidine (ZANTAC) 150 MG tablet, Take 150 mg by mouth  daily. , Disp: , Rfl:  .  azithromycin (ZITHROMAX) 250 MG tablet, 2 tabs on first day then 1 tab daily, Disp: 6 tablet, Rfl: 0 .  predniSONE (DELTASONE) 10 MG tablet, 59m (4 tabs) x1 day, then 321m(3tabs x2 days), then 2015m2tabs x2 days) then 25m64mtab x 2 days), Disp: 13 tablet, Rfl: 0  EXAM:  Filed Vitals:   03/14/15 1501  BP: 118/80  Pulse: 99  Temp: 98.4 F (36.9 C)    Body mass index is 48.84 kg/(m^2).  GENERAL: vitals reviewed and listed above, alert, oriented, appears well hydrated and in no acute distress  HEENT: atraumatic, conjunttiva clear, no obvious abnormalities on inspection of external nose and ears, normal appearance of ear canals and TMs, clear nasal congestion, mild post  oropharyngeal erythema with PND, no tonsillar edema or exudate, no sinus TTP  NECK: no obvious masses on inspection  LUNGS: clear to auscultation bilaterally, no wheezes, rales or rhonchi, good air movement  CV: HRRR, no peripheral edema  MS: moves all extremities without noticeable abnormality  PSYCH: pleasant and cooperative, no obvious depression or anxiety  ASSESSMENT AND PLAN:  Discussed the following assessment and plan:  Acute upper respiratory infection  Mild persistent asthma, with acute exacerbation - Plan: predniSONE (DELTASONE) 10 MG tablet  -given HPI and exam findings today, a serious infection or illness is unlikely. We discussed potential etiologies, with VURI with mild asthma exacerbation being most likely, and opted for supportive care and monitoring along with prednisone. Delayed abx if worsening or not improving in terms of sinus pain/sputum production. We discussed treatment side effects, risks of treatments in lactating mother,  likely course, antibiotic misuse, transmission, and signs of developing a serious illness. -of course, we advised to return or notify a doctor immediately if symptoms worsen or persist or new concerns arise.    Patient Instructions  INSTRUCTIONS FOR UPPER RESPIRATORY INFECTION:  -take medications as instructed  -plenty of rest and fluids  -nasal saline wash 2-3 times daily (use prepackaged nasal saline or bottled/distilled water if making your own)   -can use AFRIN nasal spray for drainage and nasal congestion - but do NOT use longer then 3-4 days  -can use tylenol (in no history of liver disease) as directed for aches and sorethroat  -in the winter time, using a humidifier at night is helpful (please follow cleaning instructions)  -if you are taking a cough medication - use only as directed, may also try a teaspoon of honey to coat the throat and throat lozenges. If given a cough medication with codeine or hydrocodone or other  narcotic please be advised that this contains a strong and  potentially addicting medication. Please follow instructions carefully, take as little as possible and only use AS NEEDED for severe cough. Discuss potential side effects with your pharmacy. Please do not drive or operate machinery while taking these types of medications. Please do not take other sedating medications, drugs or alcohol while taking this medication without discussing with your doctor.  -for sore throat, salt water gargles can help  -follow up if you have fevers, facial pain, tooth pain, difficulty breathing or are worsening or symptoms persist longer then expected  Upper Respiratory Infection, Adult An upper respiratory infection (URI) is also known as the common cold. It is often caused by a type of germ (virus). Colds are easily spread (contagious). You can pass it to others by kissing, coughing, sneezing, or drinking out of the same glass. Usually, you  get better in 1 to 3  weeks.  However, the cough can last for even longer. HOME CARE   Only take medicine as told by your doctor. Follow instructions provided above.  Drink enough water and fluids to keep your pee (urine) clear or pale yellow.  Get plenty of rest.  Return to work when your temperature is < 100 for 24 hours or as told by your doctor. You may use a face mask and wash your hands to stop your cold from spreading. GET HELP RIGHT AWAY IF:   After the first few days, you feel you are getting worse.  You have questions about your medicine.  You have chills, shortness of breath, or red spit (mucus).  You have pain in the face for more then 1-2 days, especially when you bend forward.  You have a fever, puffy (swollen) neck, pain when you swallow, or white spots in the back of your throat.  You have a bad headache, ear pain, sinus pain, or chest pain.  You have a high-pitched whistling sound when you breathe in and out (wheezing).  You cough up  blood.  You have sore muscles or a stiff neck. MAKE SURE YOU:   Understand these instructions.  Will watch your condition.  Will get help right away if you are not doing well or get worse. Document Released: 09/15/2007 Document Revised: 06/21/2011 Document Reviewed: 07/04/2013 [facility] Patient Information 2015 [place], [place]. This information is not intended to replace advice given to you by your health care provider. Make sure you discuss any questions you have with your health care provider.      Colin Benton R.

## 2015-03-14 NOTE — Progress Notes (Signed)
Pre visit review using our clinic review tool, if applicable. No additional management support is needed unless otherwise documented below in the visit note. 

## 2015-03-14 NOTE — Patient Instructions (Signed)
INSTRUCTIONS FOR UPPER RESPIRATORY INFECTION:  -take medications as instructed  -plenty of rest and fluids  -nasal saline wash 2-3 times daily (use prepackaged nasal saline or bottled/distilled water if making your own)   -can use AFRIN nasal spray for drainage and nasal congestion - but do NOT use longer then 3-4 days  -can use tylenol (in no history of liver disease) as directed for aches and sorethroat  -in the winter time, using a humidifier at night is helpful (please follow cleaning instructions)  -if you are taking a cough medication - use only as directed, may also try a teaspoon of honey to coat the throat and throat lozenges. If given a cough medication with codeine or hydrocodone or other narcotic please be advised that this contains a strong and  potentially addicting medication. Please follow instructions carefully, take as little as possible and only use AS NEEDED for severe cough. Discuss potential side effects with your pharmacy. Please do not drive or operate machinery while taking these types of medications. Please do not take other sedating medications, drugs or alcohol while taking this medication without discussing with your doctor.  -for sore throat, salt water gargles can help  -follow up if you have fevers, facial pain, tooth pain, difficulty breathing or are worsening or symptoms persist longer then expected  Upper Respiratory Infection, Adult An upper respiratory infection (URI) is also known as the common cold. It is often caused by a type of germ (virus). Colds are easily spread (contagious). You can pass it to others by kissing, coughing, sneezing, or drinking out of the same glass. Usually, you get better in 1 to 3  weeks.  However, the cough can last for even longer. HOME CARE   Only take medicine as told by your doctor. Follow instructions provided above.  Drink enough water and fluids to keep your pee (urine) clear or pale yellow.  Get plenty of  rest.  Return to work when your temperature is < 100 for 24 hours or as told by your doctor. You may use a face mask and wash your hands to stop your cold from spreading. GET HELP RIGHT AWAY IF:   After the first few days, you feel you are getting worse.  You have questions about your medicine.  You have chills, shortness of breath, or red spit (mucus).  You have pain in the face for more then 1-2 days, especially when you bend forward.  You have a fever, puffy (swollen) neck, pain when you swallow, or white spots in the back of your throat.  You have a bad headache, ear pain, sinus pain, or chest pain.  You have a high-pitched whistling sound when you breathe in and out (wheezing).  You cough up blood.  You have sore muscles or a stiff neck. MAKE SURE YOU:   Understand these instructions.  Will watch your condition.  Will get help right away if you are not doing well or get worse. Document Released: 09/15/2007 Document Revised: 06/21/2011 Document Reviewed: 07/04/2013 [facility] Patient Information 2015 [place], [place]. This information is not intended to replace advice given to you by your health care provider. Make sure you discuss any questions you have with your health care provider.

## 2015-06-23 ENCOUNTER — Other Ambulatory Visit: Payer: Self-pay | Admitting: Obstetrics and Gynecology

## 2015-06-24 LAB — CYTOLOGY - PAP

## 2015-07-04 ENCOUNTER — Ambulatory Visit (INDEPENDENT_AMBULATORY_CARE_PROVIDER_SITE_OTHER): Payer: 59 | Admitting: Adult Health

## 2015-07-04 ENCOUNTER — Encounter: Payer: Self-pay | Admitting: Adult Health

## 2015-07-04 VITALS — BP 126/80 | HR 117 | Temp 100.0°F | Ht 69.0 in | Wt 334.0 lb

## 2015-07-04 DIAGNOSIS — R6889 Other general symptoms and signs: Secondary | ICD-10-CM | POA: Diagnosis not present

## 2015-07-04 DIAGNOSIS — H6593 Unspecified nonsuppurative otitis media, bilateral: Secondary | ICD-10-CM | POA: Diagnosis not present

## 2015-07-04 DIAGNOSIS — J0101 Acute recurrent maxillary sinusitis: Secondary | ICD-10-CM

## 2015-07-04 LAB — POCT INFLUENZA A: Rapid Influenza A Ag: NEGATIVE

## 2015-07-04 MED ORDER — AZITHROMYCIN 250 MG PO TABS: ORAL_TABLET | ORAL | Status: DC

## 2015-07-04 MED ORDER — PREDNISONE 10 MG PO TABS: ORAL_TABLET | ORAL | Status: DC

## 2015-07-04 NOTE — Progress Notes (Signed)
Subjective:    Patient ID: Sydney Bradley, female    DOB: [birth year], 35 y.o.   MRN: [id number]  HPI  This is a 35 year old African-American female resents to the office today for recurrent sinusitis. One week ago she started to feel lethargic and have a nonproductive cough this lasted throughout the week until about 2 days ago when her cough became per productive. Her last today she is also presented with sinus pain and pressure fever, chills, bilateral ear pain, and generalized body aches.  She has been using Zyrtec and ibuprofen with minimal relief   Review of Systems  All other systems reviewed and are negative.  Past Medical History  Diagnosis Date  . Hypertension     no meds x 3 yrs  . Arthritis     L5 Back - no meds - otc prn  . IBS (irritable bowel syndrome)   . Asthma   . Sacral fracture [facility])     Social History   Social History  . Marital Status: Married    Spouse Name: N/A  . Number of Children: N/A  . Years of Education: N/A   Occupational History  . Not on file.   Social History Main Topics  . Smoking status: Never Smoker   . Smokeless tobacco: Never Used  . Alcohol Use: No  . Drug Use: No  . Sexual Activity: Yes    Birth Control/ Protection: None   Other Topics Concern  . 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 Tonsillectomy  2007  . Tonsillectomy and adenoidectomy  2007  . Dilation and evacuation  02/25/2012    Procedure: DILATATION AND EVACUATION;  Surgeon: Daria Pastures, MD;  Location: [facility];  Service: Gynecology;  Laterality: N/A;  . Dilation and evacuation N/A 03/01/2013    Procedure: DILATATION AND EVACUATION;  Surgeon: Olga Millers, MD;  Location: [facility];  Service: Gynecology;  Laterality: N/A;    Family History  Problem Relation Age of Onset  . COPD Maternal Grandmother   . Cancer  Paternal Grandmother     blood cancer?  . Stroke Paternal Grandmother   . Dementia Maternal Grandmother   . Alzheimer's disease Maternal Grandfather     Allergies  Allergen Reactions  . Latex Hives    Breathing problems  . 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 Shellfish Allergy Anaphylaxis  . Penicillins Hives    Has patient had a PCN reaction causing immediate rash, facial/tongue/throat swelling, SOB or lightheadedness with hypotension: Yes Has patient had a PCN reaction causing severe rash involving mucus membranes or skin necrosis: No Has patient had a PCN reaction that required hospitalization No Has patient had a PCN reaction occurring within the last 10 years: No If all of the above answers are "NO", then may proceed with Cephalosporin use.   . Chocolate Itching  . Ciprofloxacin Hives  . Lamisil [Terbinafine] Hives  . Wheat Bran Itching    Current Outpatient Prescriptions on File Prior to Visit  Medication Sig Dispense Refill  . albuterol (PROVENTIL HFA;VENTOLIN HFA) 108 (90 BASE) MCG/ACT inhaler Inhale 2 puffs into the lungs every 4 (four) hours as needed for wheezing. 1 Inhaler 0  . azithromycin (ZITHROMAX) 250 MG tablet 2 tabs on first day  then 1 tab daily 6 tablet 0  . cetirizine (ZYRTEC) 10 MG tablet Take 10 mg by mouth daily.     . diphenhydrAMINE (BENADRYL) 25 MG tablet Take 25 mg by mouth every 6 (six) hours as needed for allergies.    Marland Kitchen docusate sodium (COLACE) 100 MG capsule Take 1 capsule (100 mg total) by mouth 2 (two) times daily. (Patient taking differently: Take 100 mg by mouth 2 (two) times daily as needed for mild constipation. ) 60 capsule 0  . fluticasone (FLONASE) 50 MCG/ACT nasal spray Place 2 sprays into both nostrils daily. (Patient taking differently: Place 2 sprays into both nostrils daily as needed for allergies. ) 16 g 6  . ibuprofen (ADVIL,MOTRIN) 600 MG tablet Take 1 tablet (600 mg total) by mouth every 8 (eight)  hours as needed. 30 tablet 6  . labetalol (NORMODYNE) 200 MG tablet Take 1 tablet (200 mg total) by mouth 2 (two) times daily. 60 tablet 1  . Prenatal Vit-Fe Fumarate-FA (PRENATAL MULTIVITAMIN) TABS tablet Take 1 tablet by mouth daily at 12 noon.    . ranitidine (ZANTAC) 150 MG tablet Take 150 mg by mouth daily.      No current facility-administered medications on file prior to visit.    BP 126/80 mmHg  Pulse 117  Temp(Src) 100 F (37.8 C) (Oral)  Ht 5' 9"  (1.753 m)  Wt 334 lb (151.501 kg)  BMI 49.30 kg/m2  SpO2 98%       Objective:   Physical Exam  Constitutional: She is oriented to person, place, and time. She appears well-developed and well-nourished. No distress.  He is ill but not toxic looking  HENT:  Head: Normocephalic and atraumatic.  Right Ear: Hearing, external ear and ear canal normal. Tympanic membrane is erythematous and bulging.  Left Ear: Hearing, external ear and ear canal normal. Tympanic membrane is erythematous and bulging.  Nose: Nose normal.  Mouth/Throat: Oropharynx is clear and moist. No oropharyngeal exudate.  Neck: Normal range of motion. Neck supple.  Cardiovascular: Regular rhythm, normal heart sounds and intact distal pulses.  Tachycardia present.  Exam reveals no gallop and no friction rub.   No murmur heard. Pulmonary/Chest: Effort normal. No respiratory distress. She has wheezes in the right upper field and the left upper field. She has no rales. She exhibits no tenderness.  Abdominal: Soft. Bowel sounds are normal. She exhibits no distension and no mass. There is no tenderness. There is no rebound and no guarding.  Musculoskeletal: Normal range of motion. She exhibits no edema or tenderness.  Lymphadenopathy:    She has cervical adenopathy.  Neurological: She is alert and oriented to person, place, and time.  Skin: Skin is warm and dry. No rash noted. She is not diaphoretic. No erythema. No pallor.  Psychiatric: She has a normal mood and  affect. Her behavior is normal. Judgment and thought content normal.  Nursing note and vitals reviewed.     Assessment & Plan:  1. Flu-like symptoms - POCT Influenza A  2. Acute recurrent maxillary sinusitis -Stay hydrated and rest - azithromycin (ZITHROMAX Z-PAK) 250 MG tablet; Take 2 tablets on Day 1.  Then take 1 tablet daily.  Dispense: 6 tablet; Refill: 0 - predniSONE (DELTASONE) 10 MG tablet; 40 mgx 3 days, 20 mgx 3 days, 10 mgx 3 days  Dispense: 21 tablet; Refill: 0 Follow-up with no improvement in the next 2 or 3 days or sooner if symptoms come worse 3. Otitis media with effusion, bilateral - azithromycin (  ZITHROMAX Z-PAK) 250 MG tablet; Take 2 tablets on Day 1.  Then take 1 tablet daily.  Dispense: 6 tablet; Refill: 0

## 2015-07-04 NOTE — Patient Instructions (Signed)
I am sorry you are feeling this way.   I have sent in a prescription for prednisone,take as directed  40 mg x 3 days 20 mg x3 days 10 mg x3 days  I have also sent in a prescription for a Z pak. Take as directed.

## 2015-07-09 ENCOUNTER — Telehealth: Payer: Self-pay | Admitting: Adult Health

## 2015-07-09 NOTE — Telephone Encounter (Signed)
Please advise 

## 2015-07-09 NOTE — Telephone Encounter (Signed)
Pt saw Tommi Rumps on Friday, flu like symptoms.  Pt has been sick all weekend, and today her son was dx with the flu. Son's pediatrician advised pt to call her doctor and get rx for tami flu.  [place] [employer] [address]

## 2015-07-10 NOTE — Telephone Encounter (Signed)
Unfortunantly,she is outside the 48 hour window where Tami flu is effective.It would do her no good to take it.

## 2015-07-10 NOTE — Telephone Encounter (Signed)
Patient notified of Cory's comments. Patient verbalized understanding.

## 2015-08-01 ENCOUNTER — Encounter: Payer: Self-pay | Admitting: Adult Health

## 2015-08-01 ENCOUNTER — Ambulatory Visit (INDEPENDENT_AMBULATORY_CARE_PROVIDER_SITE_OTHER): Payer: 59 | Admitting: Adult Health

## 2015-08-01 MED ORDER — PHENTERMINE HCL 30 MG PO CAPS: 30.0000 mg | ORAL_CAPSULE | ORAL | Status: DC

## 2015-08-01 NOTE — Progress Notes (Signed)
Subjective:    Patient ID: Sydney Bradley, female    DOB: [birth year], 35 y.o.   MRN: [id number]  HPI  35 year old female who presents to the office today to discuss weight loss. She gained considerable weight with her last pregnancy. Has taken Phentermine in the past and would like to retry it.   She is walking and goes to the gym. She is also eating healthy.   She is no longer breast feeding.   Wt Readings from Last 3 Encounters:  08/01/15 336 lb (152.409 kg)  07/04/15 334 lb (151.501 kg)  03/14/15 330 lb 14.4 oz (150.095 kg)      Review of Systems  Constitutional: Negative.        Obesity  Respiratory: Negative.   Cardiovascular: Negative.   Neurological: Negative.   All other systems reviewed and are negative.  Past Medical History  Diagnosis Date  . Hypertension     no meds x 3 yrs  . Arthritis     L5 Back - no meds - otc prn  . IBS (irritable bowel syndrome)   . Asthma   . Sacral fracture [facility])     Social History   Social History  . Marital Status: Married    Spouse Name: N/A  . Number of Children: N/A  . Years of Education: N/A   Occupational History  . Not on file.   Social History Main Topics  . Smoking status: Never Smoker   . Smokeless tobacco: Never Used  . Alcohol Use: No  . Drug Use: No  . Sexual Activity: Yes    Birth Control/ Protection: None   Other Topics Concern  . Not on file   Social History Narrative   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[employer]   Married for 5 years    One baby boy Cornelia Copa)       Past Surgical History  Procedure Laterality Date  . Tonsillectomy  2007  . Tonsillectomy and adenoidectomy  2007  . Dilation and evacuation  02/25/2012    Procedure: DILATATION AND EVACUATION;  Surgeon: Daria Pastures, MD;  Location: [facility];  Service: Gynecology;  Laterality: N/A;  . Dilation and evacuation N/A 03/01/2013    Procedure: DILATATION AND EVACUATION;  Surgeon: Olga Millers, MD;  Location: [facility];  Service: Gynecology;   Laterality: N/A;    Family History  Problem Relation Age of Onset  . COPD Maternal Grandmother   . Cancer Paternal Grandmother     blood cancer?  . Stroke Paternal Grandmother   . Dementia Maternal Grandmother   . Alzheimer's disease Maternal Grandfather     Allergies  Allergen Reactions  . Latex Hives    Breathing problems  . 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 Shellfish Allergy Anaphylaxis  . Penicillins Hives    Has patient had a PCN reaction causing immediate rash, facial/tongue/throat swelling, SOB or lightheadedness with hypotension: Yes Has patient had a PCN reaction causing severe rash involving mucus membranes or skin necrosis: No Has patient had a PCN reaction that required hospitalization No Has patient had a PCN reaction occurring within the last 10 years: No If all of the above answers are "NO", then may proceed with Cephalosporin use.   . Chocolate Itching  . Ciprofloxacin Hives  . Lamisil [Terbinafine] Hives  . Wheat Bran Itching    Current Outpatient Prescriptions on File Prior to Visit  Medication Sig Dispense Refill  . albuterol (PROVENTIL  HFA;VENTOLIN HFA) 108 (90 BASE) MCG/ACT inhaler Inhale 2 puffs into the lungs every 4 (four) hours as needed for wheezing. 1 Inhaler 0  . cetirizine (ZYRTEC) 10 MG tablet Take 10 mg by mouth daily.     . diphenhydrAMINE (BENADRYL) 25 MG tablet Take 25 mg by mouth every 6 (six) hours as needed for allergies.    Marland Kitchen docusate sodium (COLACE) 100 MG capsule Take 1 capsule (100 mg total) by mouth 2 (two) times daily. (Patient taking differently: Take 100 mg by mouth 2 (two) times daily as needed for mild constipation. ) 60 capsule 0  . fluticasone (FLONASE) 50 MCG/ACT nasal spray Place 2 sprays into both nostrils daily. (Patient taking differently: Place 2 sprays into both nostrils daily as needed for allergies. ) 16 g 6  . ibuprofen (ADVIL,MOTRIN) 600 MG tablet Take 1 tablet (600 mg total) by  mouth every 8 (eight) hours as needed. 30 tablet 6  . labetalol (NORMODYNE) 200 MG tablet Take 1 tablet (200 mg total) by mouth 2 (two) times daily. 60 tablet 1  . Prenatal Vit-Fe Fumarate-FA (PRENATAL MULTIVITAMIN) TABS tablet Take 1 tablet by mouth daily at 12 noon.    . ranitidine (ZANTAC) 150 MG tablet Take 150 mg by mouth daily.      No current facility-administered medications on file prior to visit.    Temp(Src) 98.6 F (37 C) (Oral)  Wt 336 lb (152.409 kg)       Objective:   Physical Exam  Constitutional: She is oriented to person, place, and time. She appears well-developed and well-nourished. No distress.  Cardiovascular: Normal rate, regular rhythm, normal heart sounds and intact distal pulses.  Exam reveals no gallop and no friction rub.   No murmur heard. Pulmonary/Chest: Effort normal and breath sounds normal. No respiratory distress. She has no wheezes. She has no rales. She exhibits no tenderness.  Abdominal:  obesity  Neurological: She is alert and oriented to person, place, and time.  Skin: Skin is warm and dry. No rash noted. She is not diaphoretic. No erythema. No pallor.  Psychiatric: She has a normal mood and affect. Her behavior is normal. Judgment and thought content normal.  Nursing note and vitals reviewed.     Assessment & Plan:  1. Morbid obesity, unspecified obesity type ([place]) - 336 lbs today. Her target weight for the next month is 226 - phentermine 30 MG capsule; Take 1 capsule (30 mg total) by mouth every morning.  Dispense: 30 capsule; Refill: 0 - Increase exercise - Follow up in one month or sooner if needed  [name], NP

## 2015-08-01 NOTE — Patient Instructions (Addendum)
Take Phentermine every morning.   Follow up with me in one month   Let me know if you need anything in the meantime  Calorie Counting for Weight Loss Calories are energy you get from the things you eat and drink. Your body uses this energy to keep you going throughout the day. The number of calories you eat affects your weight. When you eat more calories than your body needs, your body stores the extra calories as fat. When you eat fewer calories than your body needs, your body burns fat to get the energy it needs. Calorie counting means keeping track of how many calories you eat and drink each day. If you make sure to eat fewer calories than your body needs, you should lose weight. In order for calorie counting to work, you will need to eat the number of calories that are right for you in a day to lose a healthy amount of weight per week. A healthy amount of weight to lose per week is usually 1-2 lb (0.5-0.9 kg). A dietitian can determine how many calories you need in a day and give you suggestions on how to reach your calorie goal.  WHAT IS MY MY PLAN? My goal is to have __________ calories per day.  If I have this many calories per day, I should lose around __________ pounds per week. WHAT DO I NEED TO KNOW ABOUT CALORIE COUNTING? In order to meet your daily calorie goal, you will need to:  Find out how many calories are in each food you would like to eat. Try to do this before you eat.  Decide how much of the food you can eat.  Write down what you ate and how many calories it had. Doing this is called keeping a food log. WHERE DO I FIND CALORIE INFORMATION? The number of calories in a food can be found on a Nutrition Facts label. Note that all the information on a label is based on a specific serving of the food. If a food does not have a Nutrition Facts label, try to look up the calories online or ask your dietitian for help. HOW DO I DECIDE HOW MUCH TO EAT? To decide how much of the food  you can eat, you will need to consider both the number of calories in one serving and the size of one serving. This information can be found on the Nutrition Facts label. If a food does not have a Nutrition Facts label, look up the information online or ask your dietitian for help. Remember that calories are listed per serving. If you choose to have more than one serving of a food, you will have to multiply the calories per serving by the amount of servings you plan to eat. For example, the label on a package of bread might say that a serving size is 1 slice and that there are 90 calories in a serving. If you eat 1 slice, you will have eaten 90 calories. If you eat 2 slices, you will have eaten 180 calories. HOW DO I KEEP A FOOD LOG? After each meal, record the following information in your food log:  What you ate.  How much of it you ate.  How many calories it had.  Then, add up your calories. Keep your food log near you, such as in a small notebook in your pocket. Another option is to use a mobile app or website. Some programs will calculate calories for you and show you how  many calories you have left each time you add an item to the log. WHAT ARE SOME CALORIE COUNTING TIPS?  Use your calories on foods and drinks that will fill you up and not leave you hungry. Some examples of this include foods like nuts and nut butters, vegetables, lean proteins, and high-fiber foods (more than 5 g fiber per serving).  Eat nutritious foods and avoid empty calories. Empty calories are calories you get from foods or beverages that do not have many nutrients, such as candy and soda. It is better to have a nutritious high-calorie food (such as an avocado) than a food with few nutrients (such as a bag of chips).  Know how many calories are in the foods you eat most often. This way, you do not have to look up how many calories they have each time you eat them.  Look out for foods that may seem like low-calorie  foods but are really high-calorie foods, such as baked goods, soda, and fat-free candy.  Pay attention to calories in drinks. Drinks such as sodas, specialty coffee drinks, alcohol, and juices have a lot of calories yet do not fill you up. Choose low-calorie drinks like water and diet drinks.  Focus your calorie counting efforts on higher calorie items. Logging the calories in a garden salad that contains only vegetables is less important than calculating the calories in a milk shake.  Find a way of tracking calories that works for you. Get creative. Most people who are successful find ways to keep track of how much they eat in a day, even if they do not count every calorie. WHAT ARE SOME PORTION CONTROL TIPS?  Know how many calories are in a serving. This will help you know how many servings of a certain food you can have.  Use a measuring cup to measure serving sizes. This is helpful when you start out. With time, you will be able to estimate serving sizes for some foods.  Take some time to put servings of different foods on your favorite plates, bowls, and cups so you know what a serving looks like.  Try not to eat straight from a bag or box. Doing this can lead to overeating. Put the amount you would like to eat in a cup or on a plate to make sure you are eating the right portion.  Use smaller plates, glasses, and bowls to prevent overeating. This is a quick and easy way to practice portion control. If your plate is smaller, less food can fit on it.  Try not to multitask while eating, such as watching TV or using your computer. If it is time to eat, sit down at a table and enjoy your food. Doing this will help you to start recognizing when you are full. It will also make you more aware of what and how much you are eating. HOW CAN I CALORIE COUNT WHEN EATING OUT?  Ask for smaller portion sizes or child-sized portions.  Consider sharing an entree and sides instead of getting your own  entree.  If you get your own entree, eat only half. Ask for a box at the beginning of your meal and put the rest of your entree in it so you are not tempted to eat it.  Look for the calories on the menu. If calories are listed, choose the lower calorie options.  Choose dishes that include vegetables, fruits, whole grains, low-fat dairy products, and lean protein. Focusing on smart food choices from  each of the 5 food groups can help you stay on track at restaurants.  Choose items that are boiled, broiled, grilled, or steamed.  Choose water, milk, unsweetened iced tea, or other drinks without added sugars. If you want an alcoholic beverage, choose a lower calorie option. For example, a regular margarita can have up to 700 calories and a glass of wine has around 150.  Stay away from items that are buttered, battered, fried, or served with cream sauce. Items labeled "crispy" are usually fried, unless stated otherwise.  Ask for dressings, sauces, and syrups on the side. These are usually very high in calories, so do not eat much of them.  Watch out for salads. Many people think salads are a healthy option, but this is often not the case. Many salads come with bacon, fried chicken, lots of cheese, fried chips, and dressing. All of these items have a lot of calories. If you want a salad, choose a garden salad and ask for grilled meats or steak. Ask for the dressing on the side, or ask for olive oil and vinegar or lemon to use as dressing.  Estimate how many servings of a food you are given. For example, a serving of cooked rice is  cup or about the size of half a tennis ball or one cupcake wrapper. Knowing serving sizes will help you be aware of how much food you are eating at restaurants. The list below tells you how big or small some common portion sizes are based on everyday objects.  1 oz--4 stacked dice.  3 oz--1 deck of cards.  1 tsp--1 dice.  1 Tbsp-- a Ping-Pong ball.  2 Tbsp--1  Ping-Pong ball.   cup--1 tennis ball or 1 cupcake wrapper.  1 cup--1 baseball.   This information is not intended to replace advice given to you by your health care provider. Make sure you discuss any questions you have with your health care provider.   Document Released: 03/29/2005 Document Revised: 04/19/2014 Document Reviewed: 02/01/2013 Elsevier Interactive Patient Education [employer].

## 2015-08-04 ENCOUNTER — Telehealth: Payer: Self-pay | Admitting: Adult Health

## 2015-08-04 ENCOUNTER — Ambulatory Visit (INDEPENDENT_AMBULATORY_CARE_PROVIDER_SITE_OTHER): Payer: 59 | Admitting: Family Medicine

## 2015-08-04 ENCOUNTER — Encounter: Payer: Self-pay | Admitting: Family Medicine

## 2015-08-04 VITALS — BP 124/100 | HR 72 | Temp 98.7°F | Ht 69.0 in | Wt 330.5 lb

## 2015-08-04 DIAGNOSIS — K219 Gastro-esophageal reflux disease without esophagitis: Secondary | ICD-10-CM | POA: Diagnosis not present

## 2015-08-04 DIAGNOSIS — R1310 Dysphagia, unspecified: Secondary | ICD-10-CM | POA: Diagnosis not present

## 2015-08-04 NOTE — Progress Notes (Signed)
Pre visit review using our clinic tool,if applicable. No additional management support is needed unless otherwise documented below in the visit note.  

## 2015-08-04 NOTE — Patient Instructions (Signed)
Food Choices for Gastroesophageal Reflux Disease, Adult When you have gastroesophageal reflux disease (GERD), the foods you eat and your eating habits are very important. Choosing the right foods can help ease the discomfort of GERD. WHAT GENERAL GUIDELINES DO I NEED TO FOLLOW?  Choose fruits, vegetables, whole grains, low-fat dairy products, and low-fat meat, fish, and poultry.  Limit fats such as oils, salad dressings, butter, nuts, and avocado.  Keep a food diary to identify foods that cause symptoms.  Avoid foods that cause reflux. These may be different for different people.  Eat frequent small meals instead of three large meals each day.  Eat your meals slowly, in a relaxed setting.  Limit fried foods.  Cook foods using methods other than frying.  Avoid drinking alcohol.  Avoid drinking large amounts of liquids with your meals.  Avoid bending over or lying down until 2-3 hours after eating. WHAT FOODS ARE NOT RECOMMENDED? The following are some foods and drinks that may worsen your symptoms: Vegetables Tomatoes. Tomato juice. Tomato and spaghetti sauce. Chili peppers. Onion and garlic. Horseradish. Fruits Oranges, grapefruit, and lemon (fruit and juice). Meats High-fat meats, fish, and poultry. This includes hot dogs, ribs, ham, sausage, salami, and bacon. Dairy Whole milk and chocolate milk. Sour cream. Cream. Butter. Ice cream. Cream cheese.  Beverages Coffee and tea, with or without caffeine. Carbonated beverages or energy drinks. Condiments Hot sauce. Barbecue sauce.  Sweets/Desserts Chocolate and cocoa. Donuts. Peppermint and spearmint. Fats and Oils High-fat foods, including [place] fries and potato chips. Other Vinegar. Strong spices, such as black pepper, white pepper, red pepper, cayenne, curry powder, cloves, ginger, and chili powder. The items listed above may not be a complete list of foods and beverages to avoid. Contact your dietitian for more  information.   This information is not intended to replace advice given to you by your health care provider. Make sure you discuss any questions you have with your health care provider.   Document Released: 03/29/2005 Document Revised: 04/19/2014 Document Reviewed: 01/31/2013 Elsevier Interactive Patient Education 2016 Elsevier Inc.  Increase the Omeprazole to 40 mg once daily Consider OTC Zantac or Pepcid May also consider liquid antacid short term such as Mylanta. Let us know if not improving over the next few days.

## 2015-08-04 NOTE — Telephone Encounter (Signed)
Appointment scheduled today with Dr. Elease Hashimoto at 10:30

## 2015-08-04 NOTE — Progress Notes (Signed)
Subjective:    Patient ID: Sydney Bradley, female    DOB: Mar 20, 1981, 35 y.o.   MRN: [id number]  HPI Patient is seen with active reflux symptoms and pain with swallowing over the past few days. Onset of symptoms on Friday. She describes frequent belching and frequent substernal burning sensation. She states her appetite is good but has been afraid to eat because of pain with swallowing. She has some mild radiation toward the midline of her back  Started omeprazole 20 mg daily without relief. Nonsmoker. No caffeine use. No alcohol use. Cannot relate to specific foods. Does not appear to be positional. No nighttime symptoms. Took tums once without relief.  No recent vomiting. No stool changes. Has lost 6 pounds already since last week which she attributes to her reflux symptoms. She was prescribed phentermine last week per her primary but did not yet start this.  No prior history of reflux except for when she was pregnant. No recent dietary changes.  Past Medical History  Diagnosis Date  . Hypertension     no meds x 3 yrs  . Arthritis     L5 Back - no meds - otc prn  . IBS (irritable bowel syndrome)   . Asthma   . Sacral fracture [facility])    Past Surgical History  Procedure Laterality Date  . Tonsillectomy  2007  . Tonsillectomy and adenoidectomy  2007  . Dilation and evacuation  02/25/2012    Procedure: DILATATION AND EVACUATION;  Surgeon: Daria Pastures, MD;  Location: [facility];  Service: Gynecology;  Laterality: N/A;  . Dilation and evacuation N/A 03/01/2013    Procedure: DILATATION AND EVACUATION;  Surgeon: Olga Millers, MD;  Location: [facility];  Service: Gynecology;  Laterality: N/A;    reports that she has never smoked. She has never used smokeless tobacco. She reports that she does not drink alcohol or use illicit drugs. family history includes Alzheimer's disease in her maternal grandfather; COPD in her maternal grandmother; Cancer in her paternal grandmother; Dementia in her  maternal grandmother; Stroke in her paternal grandmother. Allergies  Allergen Reactions  . Latex Hives    Breathing problems  . 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 Shellfish Allergy Anaphylaxis  . Penicillins Hives    Has patient had a PCN reaction causing immediate rash, facial/tongue/throat swelling, SOB or lightheadedness with hypotension: Yes Has patient had a PCN reaction causing severe rash involving mucus membranes or skin necrosis: No Has patient had a PCN reaction that required hospitalization No Has patient had a PCN reaction occurring within the last 10 years: No If all of the above answers are "NO", then may proceed with Cephalosporin use.   . Chocolate Itching  . Ciprofloxacin Hives  . Lamisil [Terbinafine] Hives  . Wheat Bran Itching      Review of Systems  Constitutional: Positive for unexpected weight change. Negative for fever, chills and appetite change.  HENT: Negative for sore throat and voice change.   Respiratory: Negative for shortness of breath.   Cardiovascular: Negative for chest pain.  Gastrointestinal: Negative for nausea, vomiting and abdominal distention.  Genitourinary: Negative for dysuria.  Neurological: Negative for dizziness.  Hematological: Negative for adenopathy.       Objective:   Physical Exam  Constitutional: She appears well-developed and well-nourished.  HENT:  Mouth/Throat: Oropharynx is clear and moist.  Neck: Neck supple.  Cardiovascular: Normal rate and regular rhythm.   Pulmonary/Chest: Effort normal and breath  sounds normal. No respiratory distress. She has no wheezes. She has no rales.  Abdominal: Soft. She exhibits no distension and no mass. There is no tenderness. There is no rebound and no guarding.  Lymphadenopathy:    She has no cervical adenopathy.          Assessment & Plan:  Probable GERD. She describes some odynophagia relatively acute onset. She has had some relatively  acute weight loss of 6 pounds but no appetite changes. No recent vomiting. We've recommend that she increase her omeprazole to 40 mg daily and start over-the-counter Zantac or Pepcid twice daily. Dietary modification discussed. Avoid caffeine. If not seeing improvement over the next few days may need GI referral for further evaluation  Eulas Post MD [facility]

## 2015-08-04 NOTE — Telephone Encounter (Signed)
[facility] - Client [place] [facility] Patient Name: MASIAH LEWING DOB: 03-27-1981 Initial Comment Caller states, was seen on Friday for acid reflux, it hurts when she swallows, hurts in back, chest and neck. She feels a bubble in her throat, after swallowing anything. It also makes her feel short of breath. Nurse Assessment Nurse: Dimas Chyle, RN, Dellis Filbert Date/Time Eilene Ghazi Time): 08/04/2015 8:23:27 AM Confirm and document reason for call. If symptomatic, describe symptoms. You must click the next button to save text entered. ---Caller states, was seen on Friday for acid reflux, it hurts when she swallows, hurts in back, chest and neck. She feels a bubble in her throat, after swallowing anything. It also makes her feel short of breath. Started on Thursday. Has the patient traveled out of the country within the last 30 days? ---No Does the patient have any new or worsening symptoms? ---Yes Will a triage be completed? ---Yes Related visit to physician within the last 2 weeks? ---Yes Does the PT have any chronic conditions? (i.e. diabetes, asthma, etc.) ---Yes List chronic conditions. ---IBS Is the patient pregnant or possibly pregnant? (Ask all females between the ages of 44-55) ---No Is this a behavioral health or substance abuse call? ---No Guidelines Guideline Title Affirmed Question Affirmed Notes Abdominal Pain - Upper [1] MILD-MODERATE pain AND [2] not relieved by antacids Final Disposition User See Physician within 4 Hours (or PCP triage) Dimas Chyle, RN, Dellis Filbert Referrals REFERRED TO PCP OFFICE Disagree/Comply: Leta Baptist

## 2015-08-29 ENCOUNTER — Ambulatory Visit: Payer: Self-pay

## 2015-08-29 ENCOUNTER — Encounter: Payer: Self-pay | Admitting: Adult Health

## 2015-08-29 ENCOUNTER — Ambulatory Visit (INDEPENDENT_AMBULATORY_CARE_PROVIDER_SITE_OTHER): Payer: 59 | Admitting: Adult Health

## 2015-08-29 MED ORDER — PHENTERMINE HCL 37.5 MG PO CAPS: 37.5000 mg | ORAL_CAPSULE | ORAL | Status: DC

## 2015-08-29 NOTE — Patient Instructions (Addendum)
Congrats!   You are doing great! Keep up the good work.  If you need anything, please let me know  See you in two months

## 2015-08-29 NOTE — Progress Notes (Signed)
   Subjective:    Patient ID: Sydney Bradley, female    DOB: 08/03/1980, 35 y.o.   MRN: [id number]  HPI  35 year old female who presents to the clinic today for one month follow up after starting phentermine.   She continues to go to the gym and eat healthy.   She reports no adverse effects of medication.   She has lost 9 pounds since her last visit. She feels great and has more energy.   Wt Readings from Last 3 Encounters:  08/29/15 327 lb (148.326 kg)  08/04/15 330 lb 8 oz (149.914 kg)  08/01/15 336 lb (152.409 kg)     Review of Systems Constitutional: Negative.  Respiratory: Negative.  Cardiovascular: Negative.  Neurological: Negative.  All other systems reviewed and are negative    Objective:   Physical Exam  Constitutional: She is oriented to person, place, and time. She appears well-developed and well-nourished. No distress.  Cardiovascular: Normal rate, regular rhythm, normal heart sounds and intact distal pulses. Exam reveals no gallop and no friction rub.  No murmur heard. Pulmonary/Chest: Effort normal and breath sounds normal. No respiratory distress. She has no wheezes. She has no rales. She exhibits no tenderness.  Abdominal:  obesity  Neurological: She is alert and oriented to person, place, and time.  Skin: Skin is warm and dry. No rash noted. She is not diaphoretic. No erythema. No pallor.  Psychiatric: She has a normal mood and affect. Her behavior is normal. Judgment and thought content normal.  Nursing note and vitals reviewed.      Assessment & Plan:  1. Morbid obesity, unspecified obesity type ([place]) - Continue to eat healthy and exercise.  - Follow up in 2 months - phentermine 37.5 MG capsule; Take 1 capsule (37.5 mg total) by mouth every morning.  Dispense: 30 capsule; Refill: 0 - phentermine 37.5 MG capsule; Take 1 capsule (37.5 mg total) by mouth every morning.  Dispense: 30 capsule; Refill: 0   [name], NP

## 2015-10-31 ENCOUNTER — Ambulatory Visit: Payer: 59 | Admitting: Adult Health

## 2015-10-31 DIAGNOSIS — Z0289 Encounter for other administrative examinations: Secondary | ICD-10-CM

## 2016-05-10 IMAGING — CR DG CHEST 1V PORT
1 series · 1 of 1 positions shown · non-contrast
Comparison: 04/30/2011 and earlier.

CLINICAL DATA: Bedside central venous placement. Patient is in
active labor (36 weeks 5 days gestational age) and has
pre-eclampsia.

EXAM:
PORTABLE CHEST - 1 VIEW

[view not recorded]
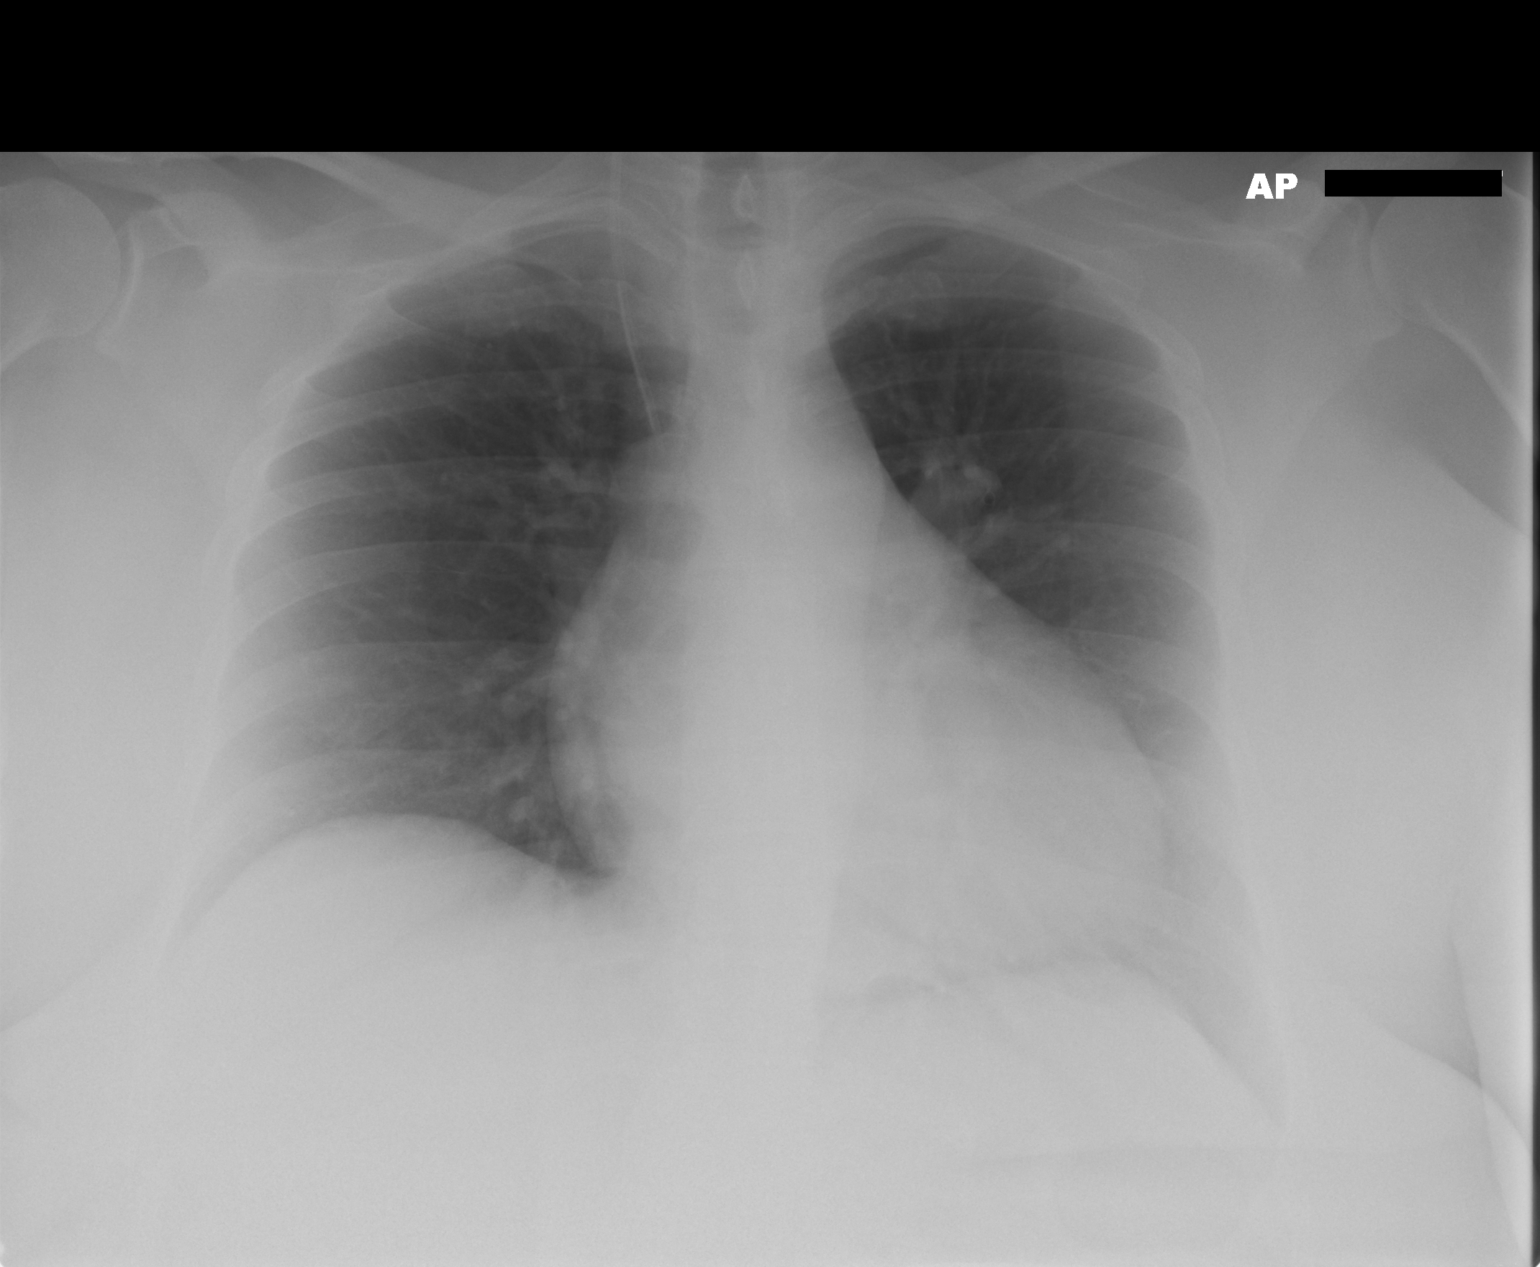

[1 of 1 positions shown; findings below may reference images not displayed]

FINDINGS: Right jugular central venous catheter tip projects over the mid SVC.
No evidence of pneumothorax or mediastinal hematoma.

Cardiomediastinal silhouette unremarkable, unchanged. Lungs clear.
Bronchovascular markings normal. Pulmonary vascularity normal. No
visible pleural effusions. No pneumothorax. Stable chronic elevation
of the right hemidiaphragm.
IMPRESSION: 1. Right jugular central venous catheter tip projects over the mid
SVC. No acute complicating features.
2.  No acute cardiopulmonary disease.

## 2016-07-12 ENCOUNTER — Other Ambulatory Visit: Payer: Self-pay | Admitting: Obstetrics and Gynecology

## 2016-07-12 DIAGNOSIS — Z01419 Encounter for gynecological examination (general) (routine) without abnormal findings: Secondary | ICD-10-CM | POA: Diagnosis not present

## 2016-07-12 DIAGNOSIS — Z124 Encounter for screening for malignant neoplasm of cervix: Secondary | ICD-10-CM | POA: Diagnosis not present

## 2016-07-14 LAB — CYTOLOGY - PAP

## 2016-08-03 ENCOUNTER — Ambulatory Visit: Payer: 59 | Admitting: Adult Health

## 2016-08-06 ENCOUNTER — Ambulatory Visit (INDEPENDENT_AMBULATORY_CARE_PROVIDER_SITE_OTHER): Payer: 59 | Admitting: Adult Health

## 2016-08-06 ENCOUNTER — Ambulatory Visit: Payer: 59 | Admitting: Adult Health

## 2016-08-06 ENCOUNTER — Encounter: Payer: Self-pay | Admitting: Adult Health

## 2016-08-06 VITALS — BP 138/80 | Ht 69.0 in | Wt 321.0 lb

## 2016-08-06 DIAGNOSIS — K580 Irritable bowel syndrome with diarrhea: Secondary | ICD-10-CM | POA: Diagnosis not present

## 2016-08-06 MED ORDER — DICYCLOMINE HCL 10 MG PO CAPS: 10.0000 mg | ORAL_CAPSULE | Freq: Three times a day (TID) | ORAL | 0 refills | Status: DC

## 2016-08-06 NOTE — Patient Instructions (Addendum)
It was great seeing you today   I have prescribed a medication called Bentyl, take this three times a day before meals and before bed.   Give this two weeks and then follow up with me   Stop Imodium for the next week and then if needed, take 1/2 tab 30-45 minutes before meals

## 2016-08-06 NOTE — Progress Notes (Signed)
Subjective:    Patient ID: Marlette Curvin, female    DOB: 11-01-1980, 36 y.o.   MRN: [id number]  HPI  36 year old female who  has a past medical history of Arthritis; Asthma; Hypertension; IBS (irritable bowel syndrome); and Sacral fracture ([place]). She presents to the office today for 2-3 months of IBS-D with abdominal cramping. She reports that the abdominal pain and cramping resolves after defecation. She has cut out fast foods, fried foods, and diary. This has not helped resolve her issue  Per patient " I will have explosive diarrhea after eating every meal.   She has been using Imodium as needed, which does not help resolve the problem.   She denies any n/v/c or feeling ill.     Review of Systems See HPI   Past Medical History:  Diagnosis Date  . Arthritis    L5 Back - no meds - otc prn  . Asthma   . Hypertension    no meds x 3 yrs  . IBS (irritable bowel syndrome)   . Sacral fracture [facility])     Social History   Social History  . Marital status: Married    Spouse name: N/A  . Number of children: N/A  . Years of education: N/A   Occupational History  . Not on file.   Social History Main Topics  . Smoking status: Never Smoker  . Smokeless tobacco: Never Used  . Alcohol use No  . Drug use: No  . Sexual activity: Yes    Birth control/ protection: None   Other Topics Concern  . 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 DILATION AND EVACUATION  02/25/2012   Procedure: DILATATION AND EVACUATION;  Surgeon: Daria Pastures, MD;  Location: [place] [facility];  Service: Gynecology;  Laterality: N/A;  . DILATION AND EVACUATION N/A 03/01/2013   Procedure: DILATATION AND EVACUATION;  Surgeon: Olga Millers, MD;  Location: [place] [facility];  Service: Gynecology;  Laterality: N/A;  . TONSILLECTOMY  2007  . TONSILLECTOMY AND ADENOIDECTOMY  2007     Family History  Problem Relation Age of Onset  . COPD Maternal Grandmother   . Cancer Paternal Grandmother     blood cancer?  . Stroke Paternal Grandmother   . Dementia Maternal Grandmother   . Alzheimer's disease Maternal Grandfather     Allergies  Allergen Reactions  . Latex Hives    Breathing problems  . 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 Shellfish Allergy Anaphylaxis  . Penicillins Hives    Has patient had a PCN reaction causing immediate rash, facial/tongue/throat swelling, SOB or lightheadedness with hypotension: Yes Has patient had a PCN reaction causing severe rash involving mucus membranes or skin necrosis: No Has patient had a PCN reaction that required hospitalization No Has patient had a PCN reaction occurring within the last 10 years: No If all of the above answers are "NO", then may proceed with Cephalosporin use.   . Chocolate Itching  . Ciprofloxacin Hives  . Lamisil [Terbinafine] Hives  . Wheat Bran Itching    Current Outpatient Prescriptions on File Prior to Visit  Medication Sig Dispense Refill  . albuterol (PROVENTIL HFA;VENTOLIN HFA) 108 (90 BASE) MCG/ACT inhaler Inhale 2 puffs into the lungs every 4 (four)  hours as needed for wheezing. 1 Inhaler 0  . cetirizine (ZYRTEC) 10 MG tablet Take 10 mg by mouth daily.     Marland Kitchen desogestrel-ethinyl estradiol (KARIVA,AZURETTE,MIRCETTE) 0.15-0.02/0.01 MG (21/5) tablet Take 1 tablet by mouth daily.    . diphenhydrAMINE (BENADRYL) 25 MG tablet Take 25 mg by mouth every 6 (six) hours as needed for allergies.    . fluticasone (FLONASE) 50 MCG/ACT nasal spray Place 2 sprays into both nostrils daily. (Patient taking differently: Place 2 sprays into both nostrils daily as needed for allergies. ) 16 g 6  . ibuprofen (ADVIL,MOTRIN) 600 MG tablet Take 1 tablet (600 mg total) by mouth every 8 (eight) hours as needed. 30 tablet 6   No current facility-administered medications on file prior  to visit.     BP 138/80 (BP Location: Left Arm, Patient Position: Sitting, Cuff Size: Normal)   Ht 5' 9"  (1.753 m)   Wt (!) 321 lb (145.6 kg)   BMI 47.40 kg/m       Objective:   Physical Exam  Constitutional:  Obese   Cardiovascular: Normal rate, regular rhythm, normal heart sounds and intact distal pulses.  Exam reveals no gallop and no friction rub.   No murmur heard. Pulmonary/Chest: Effort normal. No respiratory distress. She has no wheezes. She has no rales. She exhibits no tenderness.  Abdominal: Soft. Bowel sounds are normal. She exhibits no distension and no mass. There is no hepatosplenomegaly or splenomegaly. There is generalized tenderness. There is no rigidity, no rebound, no guarding, no CVA tenderness, no tenderness at McBurney's point and negative Murphy's sign.  Neurological: She is alert.  Skin: Skin is warm and dry. No rash noted. No erythema. No pallor.  Psychiatric: She has a normal mood and affect. Her behavior is normal. Judgment and thought content normal.  Nursing note and vitals reviewed.     Assessment & Plan:  1. Irritable bowel syndrome with diarrhea - Stop Imodium at this point.  - dicyclomine (BENTYL) 10 MG capsule; Take 1 capsule (10 mg total) by mouth 4 (four) times daily -  before meals and at bedtime.  Dispense: 120 capsule; Refill: 0 - Follow up in two weeks or sooner if needed - Consider referral to GI   [name], NP

## 2016-08-20 ENCOUNTER — Encounter: Payer: Self-pay | Admitting: Adult Health

## 2016-08-20 ENCOUNTER — Ambulatory Visit (INDEPENDENT_AMBULATORY_CARE_PROVIDER_SITE_OTHER): Payer: 59 | Admitting: Adult Health

## 2016-08-20 VITALS — BP 114/80 | Temp 98.1°F | Ht 69.0 in | Wt 324.5 lb

## 2016-08-20 DIAGNOSIS — K582 Mixed irritable bowel syndrome: Secondary | ICD-10-CM | POA: Diagnosis not present

## 2016-08-20 MED ORDER — FLUTICASONE PROPIONATE 50 MCG/ACT NA SUSP: 2.0000 | Freq: Every day | NASAL | 6 refills | Status: DC

## 2016-08-20 NOTE — Progress Notes (Signed)
Subjective:    Patient ID: Sydney Bradley, female    DOB: May 02, 1980, 36 y.o.   MRN: [id number]  HPI  36 year old female who  has a past medical history of Arthritis; Asthma; Hypertension; IBS (irritable bowel syndrome); and Sacral fracture ([place]). She presents to the office today for follow up regarding IBS-D issues. During the last visit two weeks ago she was having " explosive diarrhea after every meal" for 2-3 months with abdominal cramping.   I had started her on Bentyl QID she reports that this caused her to be constipated. She stopped taking the Bentyl and her diarrhea returned. Currently she is taking the Bentyl BID which has caused her to have a " normal bowel movement" but that she continues to have cramping and is bloated.   She denies any blood in her stool, nausea or vomiting   Review of Systems See HPI   Past Medical History:  Diagnosis Date  . Arthritis    L5 Back - no meds - otc prn  . Asthma   . Hypertension    no meds x 3 yrs  . IBS (irritable bowel syndrome)   . Sacral fracture [facility])     Social History   Social History  . Marital status: Married    Spouse name: N/A  . Number of children: N/A  . Years of education: N/A   Occupational History  . Not on file.   Social History Main Topics  . Smoking status: Never Smoker  . Smokeless tobacco: Never Used  . Alcohol use No  . Drug use: No  . Sexual activity: Yes    Birth control/ protection: None   Other Topics Concern  . 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 DILATION AND EVACUATION  02/25/2012   Procedure: DILATATION AND EVACUATION;  Surgeon: Daria Pastures, MD;  Location: [place] [facility];  Service: Gynecology;  Laterality: N/A;  . DILATION AND EVACUATION N/A 03/01/2013   Procedure: DILATATION AND EVACUATION;  Surgeon: Olga Millers, MD;  Location: [place] [facility];   Service: Gynecology;  Laterality: N/A;  . TONSILLECTOMY  2007  . TONSILLECTOMY AND ADENOIDECTOMY  2007    Family History  Problem Relation Age of Onset  . COPD Maternal Grandmother   . Cancer Paternal Grandmother        blood cancer?  . Stroke Paternal Grandmother   . Dementia Maternal Grandmother   . Alzheimer's disease Maternal Grandfather     Allergies  Allergen Reactions  . Latex Hives    Breathing problems  . 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 Shellfish Allergy Anaphylaxis  . Penicillins Hives    Has patient had a PCN reaction causing immediate rash, facial/tongue/throat swelling, SOB or lightheadedness with hypotension: Yes Has patient had a PCN reaction causing severe rash involving mucus membranes or skin necrosis: No Has patient had a PCN reaction that required hospitalization No Has patient had a PCN reaction occurring within the last 10 years: No If all of the above answers are "NO", then may proceed with Cephalosporin use.   . Chocolate Itching  . Ciprofloxacin Hives  . Lamisil [Terbinafine] Hives  . Wheat Bran Itching    Current Outpatient Prescriptions on File Prior to Visit  Medication Sig  Dispense Refill  . albuterol (PROVENTIL HFA;VENTOLIN HFA) 108 (90 BASE) MCG/ACT inhaler Inhale 2 puffs into the lungs every 4 (four) hours as needed for wheezing. 1 Inhaler 0  . cetirizine (ZYRTEC) 10 MG tablet Take 10 mg by mouth daily.     Marland Kitchen desogestrel-ethinyl estradiol (KARIVA,AZURETTE,MIRCETTE) 0.15-0.02/0.01 MG (21/5) tablet Take 1 tablet by mouth daily.    Marland Kitchen dicyclomine (BENTYL) 10 MG capsule Take 1 capsule (10 mg total) by mouth 4 (four) times daily -  before meals and at bedtime. 120 capsule 0  . diphenhydrAMINE (BENADRYL) 25 MG tablet Take 25 mg by mouth every 6 (six) hours as needed for allergies.    . fluticasone (FLONASE) 50 MCG/ACT nasal spray Place 2 sprays into both nostrils daily. (Patient taking differently: Place 2  sprays into both nostrils daily as needed for allergies. ) 16 g 6  . ibuprofen (ADVIL,MOTRIN) 600 MG tablet Take 1 tablet (600 mg total) by mouth every 8 (eight) hours as needed. 30 tablet 6   No current facility-administered medications on file prior to visit.     BP 114/80 (BP Location: Left Arm, Patient Position: Sitting, Cuff Size: Large)   Temp 98.1 F (36.7 C) (Oral)   Ht 5' 9"  (1.753 m)   Wt (!) 324 lb 8 oz (147.2 kg)   BMI 47.92 kg/m       Objective:   Physical Exam  Constitutional: She is oriented to person, place, and time. She appears well-developed and well-nourished. No distress.  Cardiovascular: Normal rate, regular rhythm, normal heart sounds and intact distal pulses.  Exam reveals no gallop and no friction rub.   No murmur heard. Pulmonary/Chest: Effort normal and breath sounds normal. No respiratory distress. She has no wheezes. She has no rales. She exhibits no tenderness.  Abdominal: Soft. Bowel sounds are normal. She exhibits distension and mass. There is no hepatosplenomegaly, splenomegaly or hepatomegaly. There is generalized tenderness. There is rebound. There is no rigidity, no guarding, no CVA tenderness, no tenderness at McBurney's point and negative Murphy's sign.  Neurological: She is alert and oriented to person, place, and time.  Skin: Skin is warm and dry. No rash noted. She is not diaphoretic. No erythema. No pallor.  Psychiatric: She has a normal mood and affect. Her behavior is normal. Judgment and thought content normal.  Nursing note and vitals reviewed.     Assessment & Plan:  1. Irritable bowel syndrome with both constipation and diarrhea - Continue with Bentyl BID  - Can add anti gas medication such as Beano  - Ambulatory referral to Gastroenterology - Follow up as needed  [name], NP

## 2016-08-25 ENCOUNTER — Encounter: Payer: Self-pay | Admitting: Internal Medicine

## 2016-08-25 ENCOUNTER — Ambulatory Visit (INDEPENDENT_AMBULATORY_CARE_PROVIDER_SITE_OTHER): Payer: 59 | Admitting: Internal Medicine

## 2016-08-25 ENCOUNTER — Other Ambulatory Visit (INDEPENDENT_AMBULATORY_CARE_PROVIDER_SITE_OTHER): Payer: 59

## 2016-08-25 VITALS — BP 122/90 | HR 72 | Ht 69.0 in | Wt 321.0 lb

## 2016-08-25 DIAGNOSIS — R109 Unspecified abdominal pain: Secondary | ICD-10-CM | POA: Diagnosis not present

## 2016-08-25 DIAGNOSIS — R197 Diarrhea, unspecified: Secondary | ICD-10-CM | POA: Diagnosis not present

## 2016-08-25 DIAGNOSIS — Z91018 Allergy to other foods: Secondary | ICD-10-CM | POA: Diagnosis not present

## 2016-08-25 DIAGNOSIS — R140 Abdominal distension (gaseous): Secondary | ICD-10-CM

## 2016-08-25 DIAGNOSIS — Z6841 Body Mass Index (BMI) 40.0 and over, adult: Secondary | ICD-10-CM

## 2016-08-25 DIAGNOSIS — R12 Heartburn: Secondary | ICD-10-CM | POA: Diagnosis not present

## 2016-08-25 LAB — COMPREHENSIVE METABOLIC PANEL
ALBUMIN: 3.8 g/dL (ref 3.5–5.2)
ALT: 9 U/L (ref 0–35)
AST: 10 U/L (ref 0–37)
Alkaline Phosphatase: 52 U/L (ref 39–117)
BUN: 7 mg/dL (ref 6–23)
CALCIUM: 9.2 mg/dL (ref 8.4–10.5)
CO2: 28 mEq/L (ref 19–32)
Chloride: 106 mEq/L (ref 96–112)
Creatinine, Ser: 0.76 mg/dL (ref 0.40–1.20)
GFR: 109.97 mL/min (ref >60.00)
Glucose, Bld: 100 mg/dL — ABNORMAL HIGH (ref 70–99)
Potassium: 4.4 mEq/L (ref 3.5–5.1)
Sodium: 138 mEq/L (ref 135–145)
Total Bilirubin: 0.5 mg/dL (ref 0.2–1.2)
Total Protein: 7.3 g/dL (ref 6.0–8.3)

## 2016-08-25 LAB — CBC WITH DIFFERENTIAL/PLATELET
BASOS ABS: 0 10*3/uL (ref 0.0–0.1)
Basophils Relative: 0.8 % (ref 0.0–3.0)
EOS ABS: 0.1 10*3/uL (ref 0.0–0.7)
Eosinophils Relative: 1.2 % (ref 0.0–5.0)
HCT: 39.8 % (ref 36.0–46.0)
HEMOGLOBIN: 13 g/dL (ref 12.0–15.0)
LYMPHS PCT: 47.1 % — AB (ref 12.0–46.0)
Lymphs Abs: 2.8 10*3/uL (ref 0.7–4.0)
MCHC: 32.8 g/dL (ref 30.0–36.0)
MCV: 81 fl (ref 78.0–100.0)
Monocytes Absolute: 0.4 10*3/uL (ref 0.1–1.0)
Monocytes Relative: 6.9 % (ref 3.0–12.0)
Neutro Abs: 2.6 10*3/uL (ref 1.4–7.7)
Neutrophils Relative %: 44 % (ref 43.0–77.0)
PLATELETS: 268 10*3/uL (ref 150.0–400.0)
RBC: 4.91 Mil/uL (ref 3.87–5.11)
RDW: 13.4 % (ref 11.5–15.5)
WBC: 5.9 10*3/uL (ref 4.0–10.5)

## 2016-08-25 LAB — TSH: TSH: 1.57 u[IU]/mL (ref 0.35–4.50)

## 2016-08-25 MED ORDER — PANTOPRAZOLE SODIUM 40 MG PO TBEC: 40.0000 mg | DELAYED_RELEASE_TABLET | Freq: Every day | ORAL | 1 refills | Status: DC

## 2016-08-25 NOTE — Progress Notes (Signed)
Bani Gianfrancesco 36 y.o. [birth year] [id number]  Assessment & Plan:   Encounter Diagnoses  Name Primary?  . Diarrhea, unspecified type Yes  . Abdominal bloating with cramps   . Heartburn   . Morbid obesity with BMI of 45.0-49.9, adult ([place])   . Multiple food allergies    This is probably irritable bowel syndrome but the persistence and bothersome nature of it warrants workup so I can understand better how to treat it. Nocturnal symptoms go against IBS as well. We discussed treating for IBS versus investigation with colonoscopy and decided to pursue a colonoscopy. Lab workup with CBC, CMET and TSH. Dicyclomine 10 mg 3 times a day before meals. An be continued Pantoprazole 40 mg before breakfast.  The risks and benefits as well as alternatives of endoscopic procedure(s) have been discussed and reviewed. All questions answered. The patient agrees to proceed.  Once I know colonoscopy results presume random biopsies most likely will direct treatment. I don't think her food allergies are playing a role here but we'll keep that in mind. There could be some overlap.  She may need to consider bariatric surgery. We re-flea talk about that today.   I appreciate the opportunity to care for this patient. CC: [name], NP   Subjective:   Chief Complaint:Diarrhea and abdominal pain  HPI The patient is a very nice married African-American woman, with problems throughout the years, with urgent loose stools. She seemed to manage this, it would happen about once every 3 or 4 months which I have a spell and treat with Pepto-Bismol. 2 years ago she was pregnant and symptoms increased. She was having a lot of diarrhea and said she didn't even gain any weight with her pregnancy. After delivery it was better but in the last 6 months it's become worse. She'll awaken at 2 in the morning with cramps and sweats and loose bowel movements. May become watery. No significant bleeding reported. Imodium was tried  with partial benefit if at all. I cycle me was prescribed by PCP with some help it actually constipated her at 4 times a day but taking it twice a day resolved diarrhea but she continued to feel bloated and cramped her abdomen.. She tries to eat well, she admits and no she is obese but says she eats a well-balanced diet with lean meats and vegetables. She cannot account any food triggers although she is lactose intolerant she thinks and avoids that. She does not use any artificial sweeteners. She's never had a colonoscopy. Many years ago she saw a gastroenterologist at [facility]. She has frequent heartburn and indigestion. She is not on specific treatment for that.  Allergies  Allergen Reactions  . Latex Hives    Breathing problems  . 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 Shellfish Allergy Anaphylaxis  . Penicillins Hives    Has patient had a PCN reaction causing immediate rash, facial/tongue/throat swelling, SOB or lightheadedness with hypotension: Yes Has patient had a PCN reaction causing severe rash involving mucus membranes or skin necrosis: No Has patient had a PCN reaction that required hospitalization No Has patient had a PCN reaction occurring within the last 10 years: No If all of the above answers are "NO", then may proceed with Cephalosporin use.   . Chocolate Itching  . Ciprofloxacin Hives  . Lamisil [Terbinafine] Hives  . Wheat Bran Itching   Current Meds  Medication Sig  . albuterol (PROVENTIL HFA;VENTOLIN HFA) 108 (90 BASE) MCG/ACT  inhaler Inhale 2 puffs into the lungs every 4 (four) hours as needed for wheezing.  . cetirizine (ZYRTEC) 10 MG tablet Take 10 mg by mouth daily.   Marland Kitchen desogestrel-ethinyl estradiol (KARIVA,AZURETTE,MIRCETTE) 0.15-0.02/0.01 MG (21/5) tablet Take 1 tablet by mouth daily.  Marland Kitchen dicyclomine (BENTYL) 10 MG capsule Take 1 capsule (10 mg total) by mouth 4 (four) times daily -  before meals and at bedtime.  . diphenhydrAMINE  (BENADRYL) 25 MG tablet Take 25 mg by mouth every 6 (six) hours as needed for allergies.  . fluticasone (FLONASE) 50 MCG/ACT nasal spray Place 2 sprays into both nostrils daily.  Marland Kitchen ibuprofen (ADVIL,MOTRIN) 600 MG tablet Take 1 tablet (600 mg total) by mouth every 8 (eight) hours as needed.   Past Medical History:  Diagnosis Date  . Arthritis    L5 Back - no meds - otc prn  . Asthma   . Hypertension    no meds x 3 yrs  . IBS (irritable bowel syndrome)   . Morbid obesity with BMI of 45.0-49.9, adult ([place])   . Sacral fracture [facility])    Past Surgical History:  Procedure Laterality Date  . DILATION AND EVACUATION  02/25/2012   Procedure: DILATATION AND EVACUATION;  Surgeon: Daria Pastures, MD;  Location: [place] [facility];  Service: Gynecology;  Laterality: N/A;  . DILATION AND EVACUATION N/A 03/01/2013   Procedure: DILATATION AND EVACUATION;  Surgeon: Olga Millers, MD;  Location: [place] [facility];  Service: Gynecology;  Laterality: N/A;  . TONSILLECTOMY  2007  . TONSILLECTOMY AND ADENOIDECTOMY  2007   Social History   Social History  . Marital status: Married    Spouse name: N/A  . Number of children: 1  . Years of education: 50   Occupational History  . social worker [employer]   Social History Main Topics  . Smoking status: Never Smoker  . Smokeless tobacco: Never Used  . Alcohol use No  . Drug use: No  . Sexual activity: Yes    Birth control/ protection: None   Other Topics Concern  . 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  Review of Systems As per history of present  illness. Some fatigue back pain insomnia, she is awakened by her symptoms. Allergy symptoms. All other review of systems are negative.  Objective:   Physical Exam @BP  122/90 (Cuff Size: Large)   Pulse 72   Ht 5' 9"  (1.753 m)   Wt (!) 321 lb (145.6 kg)   Breastfeeding? No   BMI 47.40 kg/m @  General:  Well-developed, well-nourished and in no acute distress Eyes:  anicteric. ENT:   Mouth and posterior pharynx free of lesions.  Neck:   supple w/o thyromegaly or mass.  Lungs: Clear to auscultation bilaterally. Heart:  S1S2, no rubs, murmurs, gallops. Abdomen:  soft, non-tender, no hepatosplenomegaly, hernia, or mass and BS+.  Rectal: deferred Lymph:  no cervical or supraclavicular adenopathy. Extremities:   no edema, cyanosis or clubbing Skin   no rash. Neuro:  A&O x 3.  Psych:  appropriate mood and  Affect.   Data  Reviewed:  PCP notes, labs in EMR Last Hgb mildly low at time of childbirth 7/16 2011 [place] abd fatty liver - images viewed

## 2016-08-25 NOTE — Progress Notes (Signed)
Labs ok My Chart note

## 2016-08-25 NOTE — Patient Instructions (Signed)
You have been scheduled for a colonoscopy. Please follow written instructions given to you at your visit today.  Please pick up your prep supplies at the pharmacy. If you use inhalers (even only as needed), please bring them with you on the day of your procedure. Your physician has requested that you go to www.startemmi.com and enter the access code given to you at your visit today. This web site gives a general overview about your procedure. However, you should still follow specific instructions given to you by our office regarding your preparation for the procedure.  Your physician has requested that you go to the basement for the following lab work before leaving today: CBC/diff, TSH, CMET    I appreciate the opportunity to care for you. Silvano Rusk, MD, [facility]

## 2016-08-26 ENCOUNTER — Encounter: Payer: Self-pay | Admitting: Internal Medicine

## 2016-09-20 ENCOUNTER — Encounter: Payer: Self-pay | Admitting: Internal Medicine

## 2016-10-04 ENCOUNTER — Ambulatory Visit (AMBULATORY_SURGERY_CENTER): Payer: 59 | Admitting: Internal Medicine

## 2016-10-04 ENCOUNTER — Encounter: Payer: Self-pay | Admitting: Internal Medicine

## 2016-10-04 VITALS — BP 103/64 | HR 60 | Temp 98.0°F | Resp 15 | Ht 69.0 in | Wt 321.0 lb

## 2016-10-04 DIAGNOSIS — R197 Diarrhea, unspecified: Secondary | ICD-10-CM | POA: Diagnosis present

## 2016-10-04 DIAGNOSIS — K633 Ulcer of intestine: Secondary | ICD-10-CM | POA: Diagnosis not present

## 2016-10-04 DIAGNOSIS — K6389 Other specified diseases of intestine: Secondary | ICD-10-CM

## 2016-10-04 DIAGNOSIS — K5000 Crohn's disease of small intestine without complications: Secondary | ICD-10-CM | POA: Diagnosis not present

## 2016-10-04 DIAGNOSIS — K219 Gastro-esophageal reflux disease without esophagitis: Secondary | ICD-10-CM | POA: Diagnosis not present

## 2016-10-04 MED ORDER — SODIUM CHLORIDE 0.9 % IV SOLN: 500.0000 mL | INTRAVENOUS | Status: DC

## 2016-10-04 NOTE — Progress Notes (Signed)
Report to PACU, RN, vss, BBS= Clear.  

## 2016-10-04 NOTE — Op Note (Signed)
[place] Patient Name: Sydney Bradley Procedure Date: 10/04/2016 3:28 PM MRN: [id number] Endoscopist: Gatha Mayer , MD Age: 36 Referring MD:  Date of Birth: 05-16-1980 Gender: Female Account #: [id number] Procedure:                Colonoscopy Indications:              Chronic diarrhea, Clinically significant diarrhea                            of unexplained origin Medicines:                Propofol per Anesthesia, Monitored Anesthesia Care Procedure:                Pre-Anesthesia Assessment:                           - Prior to the procedure, a History and Physical                            was performed, and patient medications and                            allergies were reviewed. The patient's tolerance of                            previous anesthesia was also reviewed. The risks                            and benefits of the procedure and the sedation                            options and risks were discussed with the patient.                            All questions were answered, and informed consent                            was obtained. Prior Anticoagulants: The patient                            last took ibuprofen 5 days prior to the procedure.                            ASA Grade Assessment: II - A patient with mild                            systemic disease. After reviewing the risks and                            benefits, the patient was deemed in satisfactory                            condition to undergo the procedure.  After obtaining informed consent, the colonoscope                            was passed under direct vision. Throughout the                            procedure, the patient's blood pressure, pulse, and                            oxygen saturations were monitored continuously. The                            Colonoscope was introduced through the anus and                            advanced to the the terminal  ileum, with                            identification of the appendiceal orifice and IC                            valve. The colonoscopy was performed without                            difficulty. The patient tolerated the procedure                            well. The quality of the bowel preparation was                            good. The terminal ileum, ileocecal valve,                            appendiceal orifice, and rectum were photographed. Scope In: 3:42:19 PM Scope Out: 4:01:32 PM Scope Withdrawal Time: 0 hours 14 minutes 6 seconds  Total Procedure Duration: 0 hours 19 minutes 13 seconds  Findings:                 The perianal and digital rectal examinations were                            normal.                           The terminal ileum contained multiple three mm                            ulcers. No bleeding was present. Biopsies were                            taken with a cold forceps for histology.                            Verification of patient identification for the  specimen was done. Estimated blood loss was minimal.                           A 2 mm polyp was found in the cecum. The polyp was                            sessile. The polyp was removed with a cold biopsy                            forceps. Resection and retrieval were complete.                            Verification of patient identification for the                            specimen was done. Estimated blood loss was minimal.                           The exam was otherwise without abnormality on                            direct and retroflexion views. Complications:            No immediate complications. Estimated Blood Loss:     Estimated blood loss was minimal. Impression:               - Multiple ulcers in the terminal ileum. Biopsied.                            ? Crohn's                           - One 2 mm polyp in the cecum, removed with a cold                             biopsy forceps. Resected and retrieved.                           - The examination was otherwise normal on direct                            and retroflexion views. Recommendation:           - Patient has a contact number available for                            emergencies. The signs and symptoms of potential                            delayed complications were discussed with the                            patient. Return to normal activities tomorrow.  Written discharge instructions were provided to the                            patient.                           - Resume previous diet.                           - Continue present medications.                           - Await pathology results.                           - Repeat colonoscopy is recommended. The                            colonoscopy date will be determined after pathology                            results from today's exam become available for                            review. Gatha Mayer, MD 10/04/2016 4:16:09 PM This report has been signed electronically.

## 2016-10-04 NOTE — Progress Notes (Signed)
Pt's states no medical or surgical changes since previsit or office visit. 

## 2016-10-04 NOTE — Progress Notes (Signed)
Called to room to assist during endoscopic procedure.  Patient ID and intended procedure confirmed with present staff. Received instructions for my participation in the procedure from the performing physician.  

## 2016-10-04 NOTE — Patient Instructions (Addendum)
I found small ulcers in the end of the small intestine - I took biopsies. This could mean you have something called Crohn's disease - I will call after the biopsies return and discuss next treatment steps and any other testing.  I appreciate the opportunity to care for you. Gatha Mayer, MD, [facility]  Repeat colonoscopy recommended.  Date to be determined after pathology results reviewed.  YOU HAD AN ENDOSCOPIC PROCEDURE TODAY AT [facility]:   Refer to the procedure report that was given to you for any specific questions about what was found during the examination.  If the procedure report does not answer your questions, please call your gastroenterologist to clarify.  If you requested that your care partner not be given the details of your procedure findings, then the procedure report has been included in a sealed envelope for you to review at your convenience later.  YOU SHOULD EXPECT: Some feelings of bloating in the abdomen. Passage of more gas than usual.  Walking can help get rid of the air that was put into your GI tract during the procedure and reduce the bloating. If you had a lower endoscopy (such as a colonoscopy or flexible sigmoidoscopy) you may notice spotting of blood in your stool or on the toilet paper. If you underwent a bowel prep for your procedure, you may not have a normal bowel movement for a few days.  Please Note:  You might notice some irritation and congestion in your nose or some drainage.  This is from the oxygen used during your procedure.  There is no need for concern and it should clear up in a day or so.  SYMPTOMS TO REPORT IMMEDIATELY:   Following lower endoscopy (colonoscopy or flexible sigmoidoscopy):  Excessive amounts of blood in the stool  Significant tenderness or worsening of abdominal pains  Swelling of the abdomen that is new, acute  Fever of 100F or higher For urgent or emergent issues, a gastroenterologist can be reached at  any hour by calling [phone].   DIET:  We do recommend a small meal at first, but then you may proceed to your regular diet.  Drink plenty of fluids but you should avoid alcoholic beverages for 24 hours.  ACTIVITY:  You should plan to take it easy for the rest of today and you should NOT DRIVE or use heavy machinery until tomorrow (because of the sedation medicines used during the test).    FOLLOW UP: Our staff will call the number listed on your records the next business day following your procedure to check on you and address any questions or concerns that you may have regarding the information given to you following your procedure. If we do not reach you, we will leave a message.  However, if you are feeling well and you are not experiencing any problems, there is no need to return our call.  We will assume that you have returned to your regular daily activities without incident.  If any biopsies were taken you will be contacted by phone or by letter within the next 1-3 weeks.  Please call us at [phone] if you have not heard about the biopsies in 3 weeks.    SIGNATURES/CONFIDENTIALITY: You and/or your care partner have signed paperwork which will be entered into your electronic medical record.  These signatures attest to the fact that that the information above on your After Visit Summary has been reviewed and is understood.  Full responsibility of  the confidentiality of this discharge information lies with you and/or your care-partner.

## 2016-10-05 ENCOUNTER — Telehealth: Payer: Self-pay

## 2016-10-05 ENCOUNTER — Telehealth: Payer: Self-pay | Admitting: *Deleted

## 2016-10-05 NOTE — Telephone Encounter (Signed)
  Follow up Call-  Call back number 10/04/2016  Post procedure Call Back phone  # [phone]  Permission to leave phone message Yes  Some recent data might be hidden     Patient questions:  Message left to call us if necessary. Second call.

## 2016-10-05 NOTE — Telephone Encounter (Signed)
Attempted to reach pt. With follow up call following endoscopic procedure 10/04/16.  LM on pt. Ans. Machine.  Will try to reach pt. Later today.

## 2016-10-08 ENCOUNTER — Other Ambulatory Visit: Payer: Self-pay

## 2016-10-08 MED ORDER — BUDESONIDE 3 MG PO CPEP: 3.0000 mg | ORAL_CAPSULE | Freq: Every day | ORAL | 1 refills | Status: DC

## 2016-10-08 NOTE — Progress Notes (Signed)
Biopsies show inflammation - not specific but could be Crohn's  I am recommending a trial of Entocort 43m take 3 daily # 90 1 refill and see me in about 2 months  If she has Crohn's would expect this to help by the 1 month mark likely  Let me know if she has ? I laid out this plan after colonoscopy  No letter/recall

## 2016-12-08 ENCOUNTER — Encounter: Payer: Self-pay | Admitting: Internal Medicine

## 2016-12-08 ENCOUNTER — Other Ambulatory Visit: Payer: 59

## 2016-12-08 ENCOUNTER — Ambulatory Visit (INDEPENDENT_AMBULATORY_CARE_PROVIDER_SITE_OTHER): Payer: 59 | Admitting: Internal Medicine

## 2016-12-08 VITALS — BP 98/68 | HR 72 | Ht 69.5 in | Wt 329.0 lb

## 2016-12-08 DIAGNOSIS — M2550 Pain in unspecified joint: Secondary | ICD-10-CM | POA: Diagnosis not present

## 2016-12-08 DIAGNOSIS — K219 Gastro-esophageal reflux disease without esophagitis: Secondary | ICD-10-CM | POA: Diagnosis not present

## 2016-12-08 DIAGNOSIS — K50018 Crohn's disease of small intestine with other complication: Secondary | ICD-10-CM | POA: Diagnosis not present

## 2016-12-08 MED ORDER — CELECOXIB 200 MG PO CAPS: 200.0000 mg | ORAL_CAPSULE | Freq: Two times a day (BID) | ORAL | 2 refills | Status: DC | PRN

## 2016-12-08 NOTE — Progress Notes (Signed)
Sydney Bradley 36 y.o. 10-Sep-1980 [id number]  Assessment & Plan:   Encounter Diagnoses  Name Primary?  . Crohn's ileitis, with diarrhea ([place]) Yes  . Gastroesophageal reflux disease, esophagitis presence not specified   . Arthralgia, unspecified joint     Will screen for biologic use.  ? Cimzia vs Humira, immunomodulators. I favor biologic Have explained risks benefits and indications of biologics and immunomodulators  [employer] handouts biologics and imunomodulators [employer] membership  We discused serology MR-E etc and decided not to   Vaccines - pneumonia to be discussed  I appreciate the opportunity to care for this patient. NF:[id number], Tommi Rumps, NP  Subjective:   Chief Complaint: f/u ileitis and diarrhea   HPI Doing very well on Entocort EC - no diarrhea or cramops and no need for dicyclomine. Had presented with chronic diarrhea and ileitis seen t colonoscopy. Some ibuprofen use - she stopped.  Heartburn gone on pantoprazole. Having some back and joint c/o esp with damp weather - ? What she can take other than ibuprofen Allergies  Allergen Reactions  . Latex Hives    Breathing problems  . 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 Shellfish Allergy Anaphylaxis  . Penicillins Hives    Has patient had a PCN reaction causing immediate rash, facial/tongue/throat swelling, SOB or lightheadedness with hypotension: Yes Has patient had a PCN reaction causing severe rash involving mucus membranes or skin necrosis: No Has patient had a PCN reaction that required hospitalization No Has patient had a PCN reaction occurring within the last 10 years: No If all of the above answers are "NO", then may proceed with Cephalosporin use.   . Chocolate Itching  . Ciprofloxacin Hives  . Lamisil [Terbinafine] Hives  . Wheat Bran Itching   Current Meds  Medication Sig  . albuterol (PROVENTIL HFA;VENTOLIN HFA) 108 (90 BASE) MCG/ACT inhaler Inhale 2 puffs into the  lungs every 4 (four) hours as needed for wheezing.  . budesonide (ENTOCORT EC) 3 MG 24 hr capsule Take 1 capsule (3 mg total) by mouth daily.  . cetirizine (ZYRTEC) 10 MG tablet Take 10 mg by mouth daily.   Marland Kitchen desogestrel-ethinyl estradiol (KARIVA,AZURETTE,MIRCETTE) 0.15-0.02/0.01 MG (21/5) tablet Take 1 tablet by mouth daily.  . diphenhydrAMINE (BENADRYL) 25 MG tablet Take 25 mg by mouth every 6 (six) hours as needed for allergies.  . fluticasone (FLONASE) 50 MCG/ACT nasal spray Place 2 sprays into both nostrils daily.  . pantoprazole (PROTONIX) 40 MG tablet Take 1 tablet (40 mg total) by mouth daily before breakfast.  . [DISCONTINUED] ibuprofen (ADVIL,MOTRIN) 600 MG tablet Take 1 tablet (600 mg total) by mouth every 8 (eight) hours as needed.   Past Medical History:  Diagnosis Date  . Arthritis    L5 Back - no meds - otc prn  . Asthma   . Hypertension    no meds x 3 yrs  . IBS (irritable bowel syndrome)   . Morbid obesity with BMI of 45.0-49.9, adult ([place])   . Sacral fracture [facility])    Past Surgical History:  Procedure Laterality Date  . COLONOSCOPY    . DILATION AND EVACUATION  02/25/2012   Procedure: DILATATION AND EVACUATION;  Surgeon: Daria Pastures, MD;  Location: [facility];  Service: Gynecology;  Laterality: N/A;  . DILATION AND EVACUATION N/A 03/01/2013   Procedure: DILATATION AND EVACUATION;  Surgeon: Olga Millers, MD;  Location: [facility];  Service: Gynecology;  Laterality: N/A;  . TONSILLECTOMY AND ADENOIDECTOMY  2007    Review of Systems As per HPI  Objective:   Physical Exam BP 98/68 (BP Location: Right Wrist, Cuff Size: Normal)   Pulse 72   Ht 5' 9.5" (1.765 m)   Wt (!) 329 lb (149.2 kg)   BMI 47.89 kg/m  NAD  15 minutes time spent with patient > half in counseling coordination of care

## 2016-12-08 NOTE — Patient Instructions (Addendum)
  Your physician has requested that you go to the basement for lab work before leaving today.   We have sent the following medications to your pharmacy for you to pick up at your convenience: Celebrex, to replace your Advil   We are giving you information to read on biologics.   We are giving you a prescription for a year's worth of a complimentary [employer] membership.  Please mail the form to sign up.   I appreciate the opportunity to care for you. Silvano Rusk, MD, [facility]

## 2016-12-09 LAB — HEPATITIS B CORE ANTIBODY, TOTAL: Hep B Core Total Ab: NONREACTIVE

## 2016-12-09 LAB — HEPATITIS B SURFACE ANTIGEN: Hepatitis B Surface Ag: NONREACTIVE

## 2016-12-10 ENCOUNTER — Encounter: Payer: Self-pay | Admitting: Internal Medicine

## 2016-12-10 DIAGNOSIS — K50018 Crohn's disease of small intestine with other complication: Secondary | ICD-10-CM | POA: Insufficient documentation

## 2016-12-10 DIAGNOSIS — K219 Gastro-esophageal reflux disease without esophagitis: Secondary | ICD-10-CM | POA: Insufficient documentation

## 2016-12-10 LAB — QUANTIFERON TB GOLD ASSAY (BLOOD)
Interferon Gamma Release Assay: NEGATIVE
Mitogen-Nil: >10 IU/mL
QUANTIFERON NIL VALUE: 0.09 [IU]/mL
Quantiferon Tb Ag Minus Nil Value: <0 IU/mL

## 2016-12-21 NOTE — Progress Notes (Signed)
Biologic screening all ok Please Rx Cimzia starter pack and then 400 mg monthly Stay on Entocort until done the starter pack then stop it  Needs OV me November

## 2016-12-23 ENCOUNTER — Telehealth: Payer: Self-pay | Admitting: Internal Medicine

## 2016-12-24 MED ORDER — CERTOLIZUMAB PEGOL 6 X 200 MG/ML ~~LOC~~ KIT: 400.0000 mg | PACK | SUBCUTANEOUS | 6 refills | Status: DC

## 2016-12-24 MED ORDER — CERTOLIZUMAB PEGOL 2 X 200 MG/ML ~~LOC~~ KIT: PACK | SUBCUTANEOUS | 0 refills | Status: DC

## 2016-12-24 NOTE — Telephone Encounter (Signed)
Cimzia must be sent to [facility].  Rx sent.  Medication is covered and does not require a prior [place]

## 2016-12-31 ENCOUNTER — Encounter: Payer: Self-pay | Admitting: Adult Health

## 2017-01-05 MED ORDER — CERTOLIZUMAB PEGOL 6 X 200 MG/ML ~~LOC~~ KIT: 400.0000 mg | PACK | SUBCUTANEOUS | 6 refills | Status: DC

## 2017-01-05 MED ORDER — CERTOLIZUMAB PEGOL 2 X 200 MG/ML ~~LOC~~ KIT: PACK | SUBCUTANEOUS | 0 refills | Status: DC

## 2017-01-05 NOTE — Telephone Encounter (Addendum)
Leanne from [facility] states that they did not receive rx. Please resend. P: [phone] F: [phone]

## 2017-01-05 NOTE — Addendum Note (Signed)
Addended by: Marlon Pel on: 01/05/2017 01:51 PM   Modules accepted: Orders

## 2017-01-05 NOTE — Telephone Encounter (Signed)
Patient RX refaxed.

## 2017-02-12 ENCOUNTER — Other Ambulatory Visit: Payer: Self-pay | Admitting: Internal Medicine

## 2017-05-04 ENCOUNTER — Telehealth: Payer: Self-pay | Admitting: Internal Medicine

## 2017-05-04 NOTE — Telephone Encounter (Signed)
Sydney Bradley is doing well with her cimzia and she wants to know when she goes for her physical in March would it be okay to ask for phentermine?  Please advise, thank you.

## 2017-05-04 NOTE — Telephone Encounter (Signed)
1) I do not think there is a problem with Cimzia and phentermine 2) she needs to schedule a follow-up w/ me also

## 2017-05-04 NOTE — Telephone Encounter (Signed)
I informed Sydney Bradley as advised and she made an appointment in March.

## 2017-05-30 ENCOUNTER — Ambulatory Visit: Payer: 59 | Admitting: Family Medicine

## 2017-05-30 ENCOUNTER — Encounter: Payer: Self-pay | Admitting: Family Medicine

## 2017-05-30 VITALS — BP 110/80 | HR 86 | Temp 98.3°F | Wt 339.0 lb

## 2017-05-30 DIAGNOSIS — B9789 Other viral agents as the cause of diseases classified elsewhere: Secondary | ICD-10-CM

## 2017-05-30 DIAGNOSIS — J069 Acute upper respiratory infection, unspecified: Secondary | ICD-10-CM

## 2017-05-30 MED ORDER — HYDROCODONE-HOMATROPINE 5-1.5 MG/5ML PO SYRP: ORAL_SOLUTION | ORAL | 0 refills | Status: DC

## 2017-05-30 NOTE — Progress Notes (Signed)
Sydney Bradley is a 37 year old female nonsmoker comes in today with a five-day history of head congestion sore throat and runny nose and cough  Her children of all been sick with a viral infection  She has a history of mild intermittent asthma and uses albuterol when necessary. She says she's not had using albuterol doses cold she's had now  BP 110/80 (BP Location: Left Wrist, Patient Position: Sitting, Cuff Size: Normal)   Pulse 86   Temp 98.3 F (36.8 C) (Oral)   Wt (!) 339 lb (153.8 kg)   BMI 49.34 kg/m  Well-developed well-nourished female no acute distress vital signs stable she's afebrile HEENT were negative neck was supple no adenopathy lungs are clear  #1 viral syndrome.......... treat symptomatically as outlined

## 2017-05-30 NOTE — Patient Instructions (Signed)
Drink lots of liquids  Tylenol......... 2 tabs 3 times daily  netti pot ...Marland Kitchen 1/8 of a teaspoon of salt plus  1/8 of a teaspoon of baking powder.....Marland Kitchen in warm water......... irrigate both nostrils in the morning and at bedtime  Hydromet..........Marland Kitchen 1/2-1 teaspoon at bedtime when necessary for cough  Okay to go to work and do your normal activities.

## 2017-07-01 ENCOUNTER — Ambulatory Visit: Payer: 59 | Admitting: Internal Medicine

## 2017-07-01 ENCOUNTER — Encounter: Payer: Self-pay | Admitting: Internal Medicine

## 2017-07-01 ENCOUNTER — Other Ambulatory Visit (INDEPENDENT_AMBULATORY_CARE_PROVIDER_SITE_OTHER): Payer: 59

## 2017-07-01 VITALS — BP 110/80 | HR 88 | Ht 69.0 in | Wt 337.0 lb

## 2017-07-01 DIAGNOSIS — K50018 Crohn's disease of small intestine with other complication: Secondary | ICD-10-CM | POA: Diagnosis not present

## 2017-07-01 DIAGNOSIS — Z79899 Other long term (current) drug therapy: Secondary | ICD-10-CM

## 2017-07-01 DIAGNOSIS — Z7960 Long term (current) use of unspecified immunomodulators and immunosuppressants: Secondary | ICD-10-CM

## 2017-07-01 DIAGNOSIS — K5000 Crohn's disease of small intestine without complications: Secondary | ICD-10-CM

## 2017-07-01 LAB — COMPREHENSIVE METABOLIC PANEL
ALBUMIN: 3.7 g/dL (ref 3.5–5.2)
ALK PHOS: 48 U/L (ref 39–117)
ALT: 15 U/L (ref 0–35)
AST: 13 U/L (ref 0–37)
BILIRUBIN TOTAL: 0.4 mg/dL (ref 0.2–1.2)
BUN: 12 mg/dL (ref 6–23)
CALCIUM: 9.1 mg/dL (ref 8.4–10.5)
CO2: 23 mEq/L (ref 19–32)
CREATININE: 0.76 mg/dL (ref 0.40–1.20)
Chloride: 108 mEq/L (ref 96–112)
GFR: 109.47 mL/min (ref >60.00)
Glucose, Bld: 94 mg/dL (ref 70–99)
Potassium: 4 mEq/L (ref 3.5–5.1)
Sodium: 140 mEq/L (ref 135–145)
Total Protein: 7.3 g/dL (ref 6.0–8.3)

## 2017-07-01 LAB — CBC WITH DIFFERENTIAL/PLATELET
BASOS ABS: 0.1 10*3/uL (ref 0.0–0.1)
Basophils Relative: 1 % (ref 0.0–3.0)
EOS ABS: 0.1 10*3/uL (ref 0.0–0.7)
Eosinophils Relative: 1.5 % (ref 0.0–5.0)
HEMATOCRIT: 38.5 % (ref 36.0–46.0)
HEMOGLOBIN: 12.8 g/dL (ref 12.0–15.0)
LYMPHS PCT: 49.4 % — AB (ref 12.0–46.0)
Lymphs Abs: 3.1 10*3/uL (ref 0.7–4.0)
MCHC: 33.2 g/dL (ref 30.0–36.0)
MCV: 80.9 fl (ref 78.0–100.0)
MONOS PCT: 7.3 % (ref 3.0–12.0)
Monocytes Absolute: 0.5 10*3/uL (ref 0.1–1.0)
Neutro Abs: 2.6 10*3/uL (ref 1.4–7.7)
Neutrophils Relative %: 40.8 % — ABNORMAL LOW (ref 43.0–77.0)
PLATELETS: 245 10*3/uL (ref 150.0–400.0)
RBC: 4.76 Mil/uL (ref 3.87–5.11)
RDW: 12.9 % (ref 11.5–15.5)
WBC: 6.4 10*3/uL (ref 4.0–10.5)

## 2017-07-01 NOTE — Assessment & Plan Note (Signed)
She has had success with phentermine twice before and has not gained after losing.  She wants to try this again, I do not think there is any problem in using that with her Cimzia.

## 2017-07-01 NOTE — Assessment & Plan Note (Signed)
Needs QuantiFERON reassessment again in August 2019

## 2017-07-01 NOTE — Progress Notes (Signed)
Sydney Bradley 37 y.o. 06/08/1980 [id number]  Assessment & Plan:   Crohn's ileitis, with diarrhea ([place]) Significantly improved.  I think some of her symptoms that she has are dietary only.  I do not know if that is related to her Crohn's or not.  We will continue to observe.  Continue Cimzia at current dose.  I anticipate seeing her in approximately 6 months. CBC and CMET today  Long-term use of immunosuppressant medication - Cimzia Needs QuantiFERON reassessment again in August 2019  Morbid obesity She has had success with phentermine twice before and has not gained after losing.  She wants to try this again, I do not think there is any problem in using that with her Cimzia.   I appreciate the opportunity to care for this patient. CC: [name], NP   Subjective:   Chief Complaint: Follow-up of Crohn's ileitis  HPI The patient is here alone, she was last seen in August 2018 and was started on Cimzia shortly thereafter.  Crohn's ileitis was diagnosed in the summer 2018 by colonoscopy and originally treated with Entocort and then Cimzia.  She has done well she has occasional cramps and loose stools but that is typically associated with eating fried and fatty greasy foods.  She is clearly significantly better than prior to diagnosis and treatment, she is having 1 or 2 formed bowel movements in general.  She wants to use phentermine to try to lose weight like she has done in the past and plans to talk to her primary care provider about that.  She did not get the flu shot this year because "I do not do the flu shot"  She had a viral upper respiratory infection in February and more recently she thinks [place] virus or some other type of gastroenteritis passed through her household from her child to herself and her husband.  She is recovered from that diarrheal illness. Allergies  Allergen Reactions  . Latex Hives    Breathing problems  . Peanut-Containing Drug Products Anaphylaxis   Allergy to all nuts   . Shellfish Allergy Anaphylaxis  . Penicillins Hives    Has patient had a PCN reaction causing immediate rash, facial/tongue/throat swelling, SOB or lightheadedness with hypotension: Yes Has patient had a PCN reaction causing severe rash involving mucus membranes or skin necrosis: No Has patient had a PCN reaction that required hospitalization No Has patient had a PCN reaction occurring within the last 10 years: No If all of the above answers are "NO", then may proceed with Cephalosporin use.   . Chocolate Itching  . Ciprofloxacin Hives  . Lamisil [Terbinafine] Hives  . Wheat Bran Itching   Current Meds  Medication Sig  . albuterol (PROVENTIL HFA;VENTOLIN HFA) 108 (90 BASE) MCG/ACT inhaler Inhale 2 puffs into the lungs every 4 (four) hours as needed for wheezing.  . Certolizumab Pegol (CIMZIA STARTER KIT) 6 X 200 MG/ML KIT Inject 400 mg into the skin every 30 (thirty) days. After the completion of the starter kit  . cetirizine (ZYRTEC) 10 MG tablet Take 10 mg by mouth daily.   Marland Kitchen desogestrel-ethinyl estradiol (KARIVA,AZURETTE,MIRCETTE) 0.15-0.02/0.01 MG (21/5) tablet Take 1 tablet by mouth daily.  . diphenhydrAMINE (BENADRYL) 25 MG tablet Take 25 mg by mouth as needed for allergies.   . fluticasone (FLONASE) 50 MCG/ACT nasal spray Place 2 sprays into both nostrils daily.   Past Medical History:  Diagnosis Date  . Arthritis    L5 Back - no meds - otc prn  .  Asthma   . Hypertension    no meds x 3 yrs  . IBS (irritable bowel syndrome)   . Morbid obesity with BMI of 45.0-49.9, adult ([place])   . Sacral fracture [facility])    Past Surgical History:  Procedure Laterality Date  . COLONOSCOPY    . DILATION AND EVACUATION  02/25/2012   Procedure: DILATATION AND EVACUATION;  Surgeon: Daria Pastures, MD;  Location: [facility];  Service: Gynecology;  Laterality: N/A;  . DILATION AND EVACUATION N/A 03/01/2013   Procedure: DILATATION AND EVACUATION;  Surgeon: Olga Millers,  MD;  Location: [facility];  Service: Gynecology;  Laterality: N/A;  . 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   Review of Systems As per HPI  Objective:   Physical Exam _0  110/80 (BP Location: Left Arm, Patient Position: Sitting, Cuff Size: Normal)   Pulse 88   Ht _1  (1.753 m) Comment: height measured without shoes  Wt (!) 337 lb (152.9 kg)   BMI 49.77 kg/m @  General:  NAD she is morbidly obese Eyes:   anicteric Lungs:  clear Heart::  S1S2 no rubs, murmurs or gallops Abdomen:  soft and nontender, BS+ Ext:   no edema, cyanosis or clubbing    Data Reviewed:  See HPI and assessment and plan

## 2017-07-01 NOTE — Patient Instructions (Signed)
  Your physician has requested that you go to the basement for the following lab work before leaving today: CBC/diff, CMET  Please come in August to get your TB test done in the lab. No appointment needed. We will call and remind you.   Follow up with Dr Carlean Purl in 6 months.    I appreciate the opportunity to care for you. Silvano Rusk, MD, [facility]

## 2017-07-01 NOTE — Assessment & Plan Note (Addendum)
Significantly improved.  I think some of her symptoms that she has are dietary only.  I do not know if that is related to her Crohn's or not.  We will continue to observe.  Continue Cimzia at current dose.  I anticipate seeing her in approximately 6 months. CBC and CMET today

## 2017-07-02 NOTE — Progress Notes (Signed)
Labs look fine - no significant abnormalities.

## 2017-09-13 ENCOUNTER — Telehealth: Payer: Self-pay

## 2017-09-13 DIAGNOSIS — K5900 Constipation, unspecified: Secondary | ICD-10-CM

## 2017-09-13 DIAGNOSIS — K5000 Crohn's disease of small intestine without complications: Secondary | ICD-10-CM

## 2017-09-13 NOTE — Addendum Note (Signed)
Addended by: Marlon Pel on: 09/13/2017 05:00 PM   Modules accepted: Orders

## 2017-09-13 NOTE — Telephone Encounter (Signed)
I received a fax from [facility] [place] that the patient has not refilled Cimzia and they are unable to reach her.  I spoke with her and she has not taken her last dose.  She has had an abrupt change in her bowel habits and is terribly constipated.  She will come in and see Dr. Carlean Purl on 09/19/17 to discuss Cimzia and change in bowel habits.

## 2017-09-13 NOTE — Telephone Encounter (Signed)
Labs and x-ray ordered Left message for patient to call back

## 2017-09-13 NOTE — Telephone Encounter (Signed)
OK  1) DG abdomen 2 view re: constipation and Crohn's 2) CBC, CMET CRP

## 2017-09-14 NOTE — Telephone Encounter (Signed)
Patient notified via vm to come for labs and x-ray at your convenience.

## 2017-09-19 ENCOUNTER — Ambulatory Visit: Payer: 59 | Admitting: Internal Medicine

## 2017-09-21 ENCOUNTER — Ambulatory Visit: Payer: 59 | Admitting: Adult Health

## 2017-09-21 ENCOUNTER — Encounter: Payer: Self-pay | Admitting: Family Medicine

## 2017-09-21 ENCOUNTER — Encounter: Payer: Self-pay | Admitting: Adult Health

## 2017-09-21 VITALS — BP 136/100 | Temp 98.5°F | Wt 338.0 lb

## 2017-09-21 DIAGNOSIS — J069 Acute upper respiratory infection, unspecified: Secondary | ICD-10-CM

## 2017-09-21 DIAGNOSIS — J4521 Mild intermittent asthma with (acute) exacerbation: Secondary | ICD-10-CM

## 2017-09-21 MED ORDER — PREDNISONE 10 MG PO TABS
ORAL_TABLET | ORAL | 0 refills | Status: DC
Start: 2017-09-21 — End: 2017-11-11

## 2017-09-21 MED ORDER — DOXYCYCLINE HYCLATE 100 MG PO CAPS: 100.0000 mg | ORAL_CAPSULE | Freq: Two times a day (BID) | ORAL | 0 refills | Status: DC

## 2017-09-21 MED ORDER — IPRATROPIUM-ALBUTEROL 0.5-2.5 (3) MG/3ML IN SOLN
3.0000 mL | Freq: Once | RESPIRATORY_TRACT | Status: AC
  Administered 2017-09-21: 3 mL via RESPIRATORY_TRACT

## 2017-09-21 NOTE — Progress Notes (Signed)
Subjective:    Patient ID: Sydney Bradley, female    DOB: Oct 19, 1980, 37 y.o.   MRN: [id number]  Symptoms started about 3 days ago with change in weather and then was exacerbated when she did a home visit and the home she went into was filled with cigarette smoke.    URI   This is a new problem. The current episode started in the past 7 days. The problem has been gradually worsening. There has been no fever. Associated symptoms include congestion, coughing (semi productive ) and rhinorrhea. Pertinent negatives include no ear pain, headaches, nausea, plugged ear sensation, sinus pain, sore throat, swollen glands or wheezing. She has tried antihistamine and inhaler use for the symptoms. The treatment provided mild relief.      Review of Systems  Constitutional: Positive for chills and fatigue. Negative for diaphoresis and fever.  HENT: Positive for congestion, postnasal drip and rhinorrhea. Negative for ear discharge, ear pain, sinus pressure, sinus pain and sore throat.   Respiratory: Positive for cough (semi productive ), chest tightness and shortness of breath. Negative for wheezing.   Cardiovascular: Negative.   Gastrointestinal: Negative for nausea.  Genitourinary: Negative.   Neurological: Negative for headaches.   Past Medical History:  Diagnosis Date  . Arthritis    L5 Back - no meds - otc prn  . Asthma   . Crohn's disease of ileum ([place])   . Hypertension    no meds x 3 yrs  . IBS (irritable bowel syndrome)   . Morbid obesity with BMI of 45.0-49.9, adult ([place])   . Sacral fracture [facility])     Social History   Socioeconomic History  . Marital status: Married    Spouse name: Not on file  . Number of children: 1  . Years of education: 3  . Highest education level: Not on file  Occupational History  . 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 Financial resource strain: Not on file  . Food insecurity:    Worry:  Not on file    Inability: Not on file  . Transportation needs:    Medical: Not on file    Non-medical: Not on file  Tobacco Use  . Smoking status: Never Smoker  . Smokeless tobacco: Never Used  Substance and Sexual Activity  . Alcohol use: No    Alcohol/week: 0.0 oz  . Drug use: No  . Sexual activity: Yes    Birth control/protection: None  Lifestyle  . Physical activity:    Days per week: Not on file    Minutes per session: Not on file  . Stress: Not on file  Relationships  . Social connections:    Talks on phone: Not on file    Gets together: Not on file    Attends religious service: Not on file    Active member of club or organization: Not on file    Attends meetings of clubs or organizations: Not on file    Relationship status: Not on file  . Intimate partner violence:    Fear of current or ex partner: Not on file    Emotionally abused: Not on file    Physically abused: Not on file    Forced sexual activity: Not on file  Other Topics Concern  . 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 COLONOSCOPY    . DILATION AND EVACUATION  02/25/2012   Procedure: DILATATION AND EVACUATION;  Surgeon: Daria Pastures, MD;  Location: [facility];  Service: Gynecology;  Laterality: N/A;  . DILATION AND EVACUATION N/A 03/01/2013   Procedure: DILATATION AND EVACUATION;  Surgeon: Olga Millers, MD;  Location: [facility];  Service: Gynecology;  Laterality: N/A;  . TONSILLECTOMY AND ADENOIDECTOMY  2007    Family History  Problem Relation Age of Onset  . COPD Maternal Grandmother   . Dementia Maternal Grandmother   . Cancer Paternal Grandmother        blood cancer?  . Stroke Paternal Grandmother   . Alzheimer's disease Maternal Grandfather     Allergies  Allergen  Reactions  . Latex Hives    Breathing problems  . 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 Shellfish Allergy Anaphylaxis  . Penicillins Hives    Has patient had a PCN reaction causing immediate rash, facial/tongue/throat swelling, SOB or lightheadedness with hypotension: Yes Has patient had a PCN reaction causing severe rash involving mucus membranes or skin necrosis: No Has patient had a PCN reaction that required hospitalization No Has patient had a PCN reaction occurring within the last 10 years: No If all of the above answers are "NO", then may proceed with Cephalosporin use.   . Chocolate Itching  . Ciprofloxacin Hives  . Lamisil [Terbinafine] Hives  . Wheat Bran Itching    Current Outpatient Medications on File Prior to Visit  Medication Sig Dispense Refill  . albuterol (PROVENTIL HFA;VENTOLIN HFA) 108 (90 BASE) MCG/ACT inhaler Inhale 2 puffs into the lungs every 4 (four) hours as needed for wheezing. 1 Inhaler 0  . Certolizumab Pegol (CIMZIA STARTER KIT) 6 X 200 MG/ML KIT Inject 400 mg into the skin every 30 (thirty) days. After the completion of the starter kit 1 kit 6  . cetirizine (ZYRTEC) 10 MG tablet Take 10 mg by mouth daily.     Marland Kitchen desogestrel-ethinyl estradiol (KARIVA,AZURETTE,MIRCETTE) 0.15-0.02/0.01 MG (21/5) tablet Take 1 tablet by mouth daily.    . diphenhydrAMINE (BENADRYL) 25 MG tablet Take 25 mg by mouth as needed for allergies.     . fluticasone (FLONASE) 50 MCG/ACT nasal spray Place 2 sprays into both nostrils daily. 16 g 6   No current facility-administered medications on file prior to visit.     BP (!) 136/100   Temp 98.5 F (36.9 C) (Oral)   Wt (!) 338 lb (153.3 kg)   BMI 49.91 kg/m        Objective:   Physical Exam  Constitutional: She is oriented to person, place, and time. She appears well-developed and well-nourished. No distress.  HENT:  Head: Normocephalic and atraumatic.  Right Ear: External ear normal.    Left Ear: External ear normal.  Nose: Rhinorrhea present. No mucosal edema or sinus tenderness. Right sinus exhibits no maxillary sinus tenderness and no frontal sinus tenderness. Left sinus exhibits no maxillary sinus tenderness and no frontal sinus tenderness.  Mouth/Throat: Oropharynx is clear and moist. No oropharyngeal exudate.  + PND    Neck: Normal range of motion. Neck supple.  Cardiovascular: Normal rate, regular rhythm, normal heart sounds and intact distal pulses. Exam reveals no gallop and no friction rub.  No murmur heard. Pulmonary/Chest: Effort normal. No stridor. No  respiratory distress. She has no wheezes. She has no rales. She exhibits no tenderness.  Decreased breath sounds throughout    Neurological: She is alert and oriented to person, place, and time.  Skin: Skin is warm and dry. Capillary refill takes less than 2 seconds. She is not diaphoretic.  Psychiatric: She has a normal mood and affect. Her behavior is normal. Judgment and thought content normal.  Nursing note and vitals reviewed.     Assessment & Plan:  1. Mild intermittent asthma with acute exacerbation - ipratropium-albuterol (DUONEB) 0.5-2.5 (3) MG/3ML nebulizer solution 3 mL - predniSONE (DELTASONE) 10 MG tablet; 40 mg x 3 days, 20 mg x 3 days, 10 mg x 3 days  Dispense: 21 tablet; Refill: 0 - Breath sounds increased after duo neb. Patient felt much improved.   2. Viral upper respiratory tract infection - Appears to be viral or seasonal allergy related. She was given a prescription for doxycycline that she can start if no improvement over the weeks.  - doxycycline (VIBRAMYCIN) 100 MG capsule; Take 1 capsule (100 mg total) by mouth 2 (two) times daily.  Dispense: 14 capsule; Refill: 0   [name], NP

## 2017-11-07 ENCOUNTER — Telehealth: Payer: Self-pay

## 2017-11-07 DIAGNOSIS — K5000 Crohn's disease of small intestine without complications: Secondary | ICD-10-CM

## 2017-11-07 NOTE — Telephone Encounter (Signed)
Left message to call me back. We received a letter from [facility] saying their  records suggest she may be non-adherent with her IBD medication.   Sydney Bradley called back while I was typing this up. She said no she has not been taking her medicine. And now she is having more diarrhea and also some constipation, nausea, cramping. She never came to get labs or x-ray done that was ordered in early June. I made her an  Appointment for the end of August. Please advise if you still want her to come and get those done now or wait until her appointment. Will route to Dr Carlean Purl and call her back. She's aware he is out this week.

## 2017-11-07 NOTE — Telephone Encounter (Signed)
Staff note just popped to myself that Emmalyn needs the TB GOLD test drawn in August as well.

## 2017-11-08 NOTE — Telephone Encounter (Signed)
Sydney Bradley will come Friday to have her labs drawn and x-ray taken.She said she stopped the Cimzia because it " worked too well" , bloated her and made her constipated. The Entocort didn't work well enough. Do you want me to add the TB GOLD test to the orders Sir?

## 2017-11-08 NOTE — Telephone Encounter (Signed)
She should go ahead with the previously ordered labs and xrays and I will f/u w/ her after that  Please ask her why she is not taking the Cimzia?  Is she willing to use Entocort again ?

## 2017-11-09 NOTE — Telephone Encounter (Signed)
Order placed

## 2017-11-09 NOTE — Telephone Encounter (Signed)
Go ahead and get the TB gold test

## 2017-11-11 ENCOUNTER — Encounter: Payer: Self-pay | Admitting: Adult Health

## 2017-11-11 ENCOUNTER — Ambulatory Visit (INDEPENDENT_AMBULATORY_CARE_PROVIDER_SITE_OTHER): Payer: 59 | Admitting: Adult Health

## 2017-11-11 VITALS — BP 126/84 | Temp 98.2°F | Ht 69.25 in | Wt 338.0 lb

## 2017-11-11 DIAGNOSIS — Z6841 Body Mass Index (BMI) 40.0 and over, adult: Secondary | ICD-10-CM

## 2017-11-11 DIAGNOSIS — E668 Other obesity: Secondary | ICD-10-CM

## 2017-11-11 DIAGNOSIS — Z0000 Encounter for general adult medical examination without abnormal findings: Secondary | ICD-10-CM | POA: Diagnosis not present

## 2017-11-11 LAB — CBC WITH DIFFERENTIAL/PLATELET
BASOS ABS: 0.1 10*3/uL (ref 0.0–0.1)
Basophils Relative: 0.9 % (ref 0.0–3.0)
EOS PCT: 1.4 % (ref 0.0–5.0)
Eosinophils Absolute: 0.1 10*3/uL (ref 0.0–0.7)
HCT: 40.6 % (ref 36.0–46.0)
HEMOGLOBIN: 13.3 g/dL (ref 12.0–15.0)
Lymphocytes Relative: 41.4 % (ref 12.0–46.0)
Lymphs Abs: 2.6 10*3/uL (ref 0.7–4.0)
MCHC: 32.8 g/dL (ref 30.0–36.0)
MCV: 81.4 fl (ref 78.0–100.0)
MONO ABS: 0.5 10*3/uL (ref 0.1–1.0)
MONOS PCT: 7.4 % (ref 3.0–12.0)
Neutro Abs: 3 10*3/uL (ref 1.4–7.7)
Neutrophils Relative %: 48.9 % (ref 43.0–77.0)
PLATELETS: 221 10*3/uL (ref 150.0–400.0)
RBC: 4.99 Mil/uL (ref 3.87–5.11)
RDW: 13.1 % (ref 11.5–15.5)
WBC: 6.2 10*3/uL (ref 4.0–10.5)

## 2017-11-11 LAB — HEPATIC FUNCTION PANEL
ALK PHOS: 71 U/L (ref 39–117)
ALT: 13 U/L (ref 0–35)
AST: 13 U/L (ref 0–37)
Albumin: 4 g/dL (ref 3.5–5.2)
BILIRUBIN TOTAL: 0.6 mg/dL (ref 0.2–1.2)
Bilirubin, Direct: 0.1 mg/dL (ref 0.0–0.3)
Total Protein: 7.1 g/dL (ref 6.0–8.3)

## 2017-11-11 LAB — BASIC METABOLIC PANEL
BUN: 9 mg/dL (ref 6–23)
CALCIUM: 9.4 mg/dL (ref 8.4–10.5)
CHLORIDE: 105 meq/L (ref 96–112)
CO2: 24 mEq/L (ref 19–32)
CREATININE: 0.73 mg/dL (ref 0.40–1.20)
GFR: 114.45 mL/min (ref >60.00)
Glucose, Bld: 99 mg/dL (ref 70–99)
Potassium: 4 mEq/L (ref 3.5–5.1)
Sodium: 139 mEq/L (ref 135–145)

## 2017-11-11 LAB — LIPID PANEL
CHOL/HDL RATIO: 4
Cholesterol: 157 mg/dL (ref 0–200)
HDL: 44.1 mg/dL (ref >39.00)
LDL Cholesterol: 93 mg/dL (ref 0–99)
NONHDL: 112.4
TRIGLYCERIDES: 98 mg/dL (ref 0.0–149.0)
VLDL: 19.6 mg/dL (ref 0.0–40.0)

## 2017-11-11 LAB — HEMOGLOBIN A1C: Hgb A1c MFr Bld: 5.8 % (ref 4.6–6.5)

## 2017-11-11 LAB — TSH: TSH: 1.79 u[IU]/mL (ref 0.35–4.50)

## 2017-11-11 MED ORDER — PHENTERMINE HCL 15 MG PO CAPS: 15.0000 mg | ORAL_CAPSULE | ORAL | 0 refills | Status: DC

## 2017-11-11 NOTE — Progress Notes (Signed)
Subjective:    Patient ID: Sydney Bradley, female    DOB: 1980-09-11, 37 y.o.   MRN: [id number]  HPI  Patient presents for yearly preventative medicine examination. She is a pleasant 37 year old female who  has a past medical history of Arthritis, Asthma, Crohn's disease of ileum ([place]), Hypertension, IBS (irritable bowel syndrome), Morbid obesity with BMI of 45.0-49.9, adult ([place]), and Sacral fracture ([place]).  Crohn's Disease -is managed by GI, Dr. Carlean Purl.  Was most recently started on Cimzia but she felt as though this caused bloating constipation.  She has a follow-up appointment with GI in August  Obesity - She has been maintaining at 338. She reports that she is eating healthy and exercising. She would like to drop another 30 pounds before February. She is asking if she can go back on Phentermine for a short period of time. She was last on Phentermine in 2017 Wt Readings from Last 3 Encounters:  11/11/17 (!) 338 lb (153.3 kg)  09/21/17 (!) 338 lb (153.3 kg)  07/01/17 (!) 337 lb (152.9 kg)   Allergy and Asthma  - Uses Zyrtec and Flonase and rescue inhaler PRN.   All immunizations and health maintenance protocols were reviewed with the patient and needed orders were placed.  Appropriate screening laboratory values were ordered for the patient including screening of hyperlipidemia, renal function and hepatic function.  Medication reconciliation,  past medical history, social history, problem list and allergies were reviewed in detail with the patient  Goals were established with regard to weight loss, exercise, and  diet in compliance with medications  She is up-to-date on routine GYN exam.    Review of Systems  Constitutional: Negative.   HENT: Negative.   Eyes: Negative.   Respiratory: Negative.   Cardiovascular: Negative.   Gastrointestinal: Negative.   Endocrine: Negative.   Genitourinary: Negative.   Musculoskeletal: Negative.   Skin: Negative.   Allergic/Immunologic:  Negative.   Neurological: Negative.   Hematological: Negative.   Psychiatric/Behavioral: Negative.    Past Medical History:  Diagnosis Date  . Arthritis    L5 Back - no meds - otc prn  . Asthma   . Crohn's disease of ileum ([place])   . Hypertension    no meds x 3 yrs  . IBS (irritable bowel syndrome)   . Morbid obesity with BMI of 45.0-49.9, adult ([place])   . Sacral fracture [facility])     Social History   Socioeconomic History  . Marital status: Married    Spouse name: Not on file  . Number of children: 1  . Years of education: 57  . Highest education level: Not on file  Occupational History  . 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 Financial resource strain: Not on file  . Food insecurity:    Worry: Not on file    Inability: Not on file  . Transportation needs:    Medical: Not on file    Non-medical: Not on file  Tobacco Use  . Smoking status: Never Smoker  . Smokeless tobacco: Never Used  Substance and Sexual Activity  . Alcohol use: No    Alcohol/week: 0.0 oz  . Drug use: No  . Sexual activity: Yes    Birth control/protection: None  Lifestyle  . Physical activity:    Days per week: Not on file    Minutes per session: Not on file  . Stress: Not on file  Relationships  . Social connections:    Talks on phone: Not on file    Gets together: Not on file    Attends religious service: Not on file    Active member of club or organization: Not on file    Attends meetings of clubs or organizations: Not on file    Relationship status: Not on file  . Intimate partner violence:    Fear of current or ex partner: Not on file    Emotionally abused: Not on file    Physically abused: Not on file    Forced sexual activity: Not on file  Other Topics Concern  . 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 COLONOSCOPY    . DILATION AND EVACUATION  02/25/2012   Procedure: DILATATION AND EVACUATION;  Surgeon: Daria Pastures, MD;  Location: [facility];  Service: Gynecology;  Laterality: N/A;  . DILATION AND EVACUATION N/A 03/01/2013   Procedure: DILATATION AND EVACUATION;  Surgeon: Olga Millers, MD;  Location: [facility];  Service: Gynecology;  Laterality: N/A;  . TONSILLECTOMY AND ADENOIDECTOMY  2007    Family History  Problem Relation Age of Onset  . COPD Maternal Grandmother   . Dementia Maternal Grandmother   . Cancer Paternal Grandmother        blood cancer?  . Stroke Paternal Grandmother   . Alzheimer's disease Maternal Grandfather     Allergies  Allergen Reactions  . Latex Hives    Breathing problems  . 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 Shellfish Allergy Anaphylaxis  . Penicillins Hives    Has patient had a PCN reaction causing immediate rash, facial/tongue/throat swelling, SOB or lightheadedness with hypotension: Yes Has patient had a PCN reaction causing severe rash involving mucus membranes or skin necrosis: No Has patient had a PCN reaction that required hospitalization No Has patient had a PCN reaction occurring within the last 10 years: No If all of the above answers are "NO", then may proceed with Cephalosporin use.   . Chocolate Itching  . Ciprofloxacin Hives  . Lamisil [Terbinafine] Hives  . Wheat Bran Itching    Current Outpatient Medications on File Prior to Visit  Medication Sig Dispense Refill  . albuterol (PROVENTIL HFA;VENTOLIN HFA) 108 (90 BASE) MCG/ACT inhaler Inhale 2 puffs into the lungs every 4 (four) hours as needed for wheezing. 1 Inhaler 0  . cetirizine (ZYRTEC) 10 MG tablet Take 10 mg by mouth daily.     . diphenhydrAMINE (BENADRYL) 25 MG tablet Take 25 mg by mouth as needed for allergies.     .  fluticasone (FLONASE) 50 MCG/ACT nasal spray Place 2 sprays into both nostrils daily. 16 g 6   No current facility-administered medications on file prior to visit.     BP 126/84 (BP Location: Left Wrist)   Temp 98.2 F (36.8 C) (Oral)   Ht 5' 9.25" (1.759 m)   Wt (!) 338 lb (153.3 kg)   BMI 49.55 kg/m      Objective:   Physical Exam  Constitutional: She is oriented to person, place, and time. She appears well-developed and well-nourished. No distress.  Obese   HENT:  Head: Normocephalic and atraumatic.  Right Ear:  External ear normal.  Left Ear: External ear normal.  Nose: Nose normal.  Mouth/Throat: Oropharynx is clear and moist. No oropharyngeal exudate.  Eyes: Pupils are equal, round, and reactive to light. Conjunctivae and EOM are normal. Right eye exhibits no discharge. Left eye exhibits no discharge. No scleral icterus.  Neck: Normal range of motion. Neck supple. No JVD present. No tracheal deviation present. No thyromegaly present.  Cardiovascular: Normal rate, regular rhythm, normal heart sounds and intact distal pulses. Exam reveals no gallop and no friction rub.  No murmur heard. Pulmonary/Chest: Effort normal and breath sounds normal. No respiratory distress. She has no wheezes. She has no rales. She exhibits no tenderness.  Abdominal: Soft. Bowel sounds are normal. She exhibits no distension and no mass. There is no tenderness. There is no rebound and no guarding. No hernia.  Musculoskeletal: Normal range of motion. She exhibits no edema, tenderness or deformity.  Lymphadenopathy:    She has no cervical adenopathy.  Neurological: She is alert and oriented to person, place, and time. She displays normal reflexes. No cranial nerve deficit or sensory deficit. She exhibits normal muscle tone. Coordination normal.  Skin: Skin is warm and dry. Capillary refill takes less than 2 seconds. No rash noted. She is not diaphoretic. No erythema. No pallor.  Psychiatric: She has a  normal mood and affect. Her behavior is normal. Judgment and thought content normal.  Nursing note and vitals reviewed.     Assessment & Plan:  1. Routine general medical examination at a health care facility  - Basic metabolic panel - CBC with Differential/Platelet - Hepatic function panel - Lipid panel - TSH - Hemoglobin A1c  2. Other obesity - Continue to exercise and eat healthy - I am ok with prescribing a short course of phentermine as she did good on it the last time. She will follow up on a monthly basis while taking this medication  - Basic metabolic panel - CBC with Differential/Platelet - Hepatic function panel - Lipid panel - TSH - Hemoglobin A1c - phentermine 15 MG capsule; Take 1 capsule (15 mg total) by mouth every morning.  Dispense: 30 capsule; Refill: 0  3. BMI 45.0-49.9, adult ([facility])  - Basic metabolic panel - CBC with Differential/Platelet - Hepatic function panel - Lipid panel - TSH - Hemoglobin A1c - phentermine 15 MG capsule; Take 1 capsule (15 mg total) by mouth every morning.  Dispense: 30 capsule; Refill: 0  [name], NP

## 2017-12-07 ENCOUNTER — Ambulatory Visit: Payer: 59 | Admitting: Internal Medicine

## 2017-12-07 ENCOUNTER — Encounter: Payer: Self-pay | Admitting: Internal Medicine

## 2017-12-07 VITALS — BP 120/80 | HR 88 | Ht 69.0 in | Wt 334.5 lb

## 2017-12-07 DIAGNOSIS — K50018 Crohn's disease of small intestine with other complication: Secondary | ICD-10-CM | POA: Diagnosis not present

## 2017-12-07 DIAGNOSIS — Z3201 Encounter for pregnancy test, result positive: Secondary | ICD-10-CM | POA: Diagnosis not present

## 2017-12-07 NOTE — Progress Notes (Signed)
Sydney Bradley 37 y.o. 1981/02/13 [id number]  Assessment & Plan:   Encounter Diagnoses  Name Primary?  . Crohn's ileitis, diarrhea ([place]) Yes  . Positive home pregnancy test    Will await formal pregnancy test results.  In general it is best to keep the patient on therapy and doing well during pregnancy.  I doubt Cimzia made her constipated.  I thought the phentermine might of done it but she was on that after the constipation began.  Cimzia does not cross the placenta so is the ideal biologic though all the Biologics are thought to be pregnancy category B I believe.  She seems to be tolerating life right now, early on in pregnancy we do not want to use steroids as I think there can be an increase in fetal loss.  I appreciate the opportunity to care for this patient. CC: [name], NP Dr. Freda Munro    Subjective:   Chief Complaint: Crohn's disease follow-up, positive home pregnancy test  HPI The patient is here, she stopped her Cimzia a few or several months ago she got very constipated she was having abdominal pains and thought that the Cimzia was working too well".  Since then she has had some intermittent symptoms with loose stools which was her problem prior to treatment, she has not been using any NSAIDs and she is been trying to avoid foods that would trigger diarrhea.  Sometimes urgent defecation not nearly as bad as it was prior to treatment.  Her last menstrual period was in July a home pregnancy test is positive and she just saw Dr. Ouida Sills is awaiting a pregnancy blood test result. Allergies  Allergen Reactions  . Latex Hives    Breathing problems  . 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 Shellfish Allergy Anaphylaxis  . Penicillins Hives    Has patient had a PCN reaction causing immediate rash, facial/tongue/throat swelling, SOB or lightheadedness with hypotension: Yes Has patient had a PCN reaction causing severe rash involving  mucus membranes or skin necrosis: No Has patient had a PCN reaction that required hospitalization No Has patient had a PCN reaction occurring within the last 10 years: No If all of the above answers are "NO", then may proceed with Cephalosporin use.   . Chocolate Itching  . Ciprofloxacin Hives  . Lamisil [Terbinafine] Hives  . Wheat Bran Itching   Current Meds  Medication Sig  . cetirizine (ZYRTEC) 10 MG tablet Take 10 mg by mouth daily.   . diphenhydrAMINE (BENADRYL) 25 MG tablet Take 25 mg by mouth as needed for allergies.   . fluticasone (FLONASE) 50 MCG/ACT nasal spray Place 2 sprays into both nostrils daily. (Patient taking differently: Place 2 sprays into both nostrils as needed. )   Past Medical History:  Diagnosis Date  . Arthritis    L5 Back - no meds - otc prn  . Asthma   . Crohn's disease of ileum ([place])   . Hypertension    no meds x 3 yrs  . IBS (irritable bowel syndrome)   . Morbid obesity with BMI of 45.0-49.9, adult ([place])   . Sacral fracture [facility])    Past Surgical History:  Procedure Laterality Date  . COLONOSCOPY    . DILATION AND EVACUATION  02/25/2012   Procedure: DILATATION AND EVACUATION;  Surgeon: Daria Pastures, MD;  Location: [facility];  Service: Gynecology;  Laterality: N/A;  . DILATION AND EVACUATION N/A 03/01/2013   Procedure: DILATATION  AND EVACUATION;  Surgeon: Olga Millers, MD;  Location: [facility];  Service: Gynecology;  Laterality: N/A;  . 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   Review of Systems  As above Objective:    Physical Exam BP 120/80 (BP Location: Left Wrist, Patient Position: Sitting, Cuff Size: Normal)   Pulse 88   Ht 5' 9"  (1.753 m)   Wt (!) 334 lb 8 oz (151.7 kg)   BMI 49.40 kg/m  Obese NAD Eyes anicteric Lungs cta Cor s1s2 no rmg abd obese NT

## 2017-12-07 NOTE — Patient Instructions (Signed)
  Please let us know the results of your pregnancy test and then we can discuss a plan.    I appreciate the opportunity to care for you. Silvano Rusk, MD, [facility]

## 2017-12-09 NOTE — Assessment & Plan Note (Signed)
Off therapy right now.  Mildly symptomatic at least.  Awaiting results of formal pregnancy test to determine next steps.

## 2017-12-13 ENCOUNTER — Encounter: Payer: Self-pay | Admitting: Adult Health

## 2017-12-13 ENCOUNTER — Ambulatory Visit: Payer: 59 | Admitting: Adult Health

## 2017-12-13 VITALS — BP 124/90 | Temp 98.2°F | Wt 331.0 lb

## 2017-12-13 DIAGNOSIS — Z6841 Body Mass Index (BMI) 40.0 and over, adult: Secondary | ICD-10-CM | POA: Diagnosis not present

## 2017-12-13 DIAGNOSIS — E668 Other obesity: Secondary | ICD-10-CM

## 2017-12-13 MED ORDER — PHENTERMINE HCL 15 MG PO CAPS: 15.0000 mg | ORAL_CAPSULE | ORAL | 0 refills | Status: DC

## 2017-12-13 MED ORDER — FLUTICASONE PROPIONATE 50 MCG/ACT NA SUSP: 2.0000 | Freq: Every day | NASAL | 6 refills | Status: DC

## 2017-12-13 NOTE — Progress Notes (Signed)
Subjective:    Patient ID: Sydney Bradley, female    DOB: 12-31-1980, 37 y.o.   MRN: [id number]  HPI  37 year old female who  has a past medical history of Arthritis, Asthma, Crohn's disease of ileum ([place]), Hypertension, IBS (irritable bowel syndrome), Morbid obesity with BMI of 45.0-49.9, adult ([place]), and Sacral fracture ([place]). She presents to the office today for follow-up regarding obesity and weight loss.  During her visit 1 month ago she was restarted on phentermine 15 mg tablets. She has been maintaining her weight around 330-338 pounds. She is exercising( walking) and is dieting but is unable to lose weight. Over the last month she has been able to lose about 7 pounds. She denies any side effects and reports increased energy level.   Wt Readings from Last 3 Encounters:  12/13/17 (!) 331 lb (150.1 kg)  12/07/17 (!) 334 lb 8 oz (151.7 kg)  11/11/17 (!) 338 lb (153.3 kg)   Review of Systems See HPI   Past Medical History:  Diagnosis Date  . Arthritis    L5 Back - no meds - otc prn  . Asthma   . Crohn's disease of ileum ([place])   . Hypertension    no meds x 3 yrs  . IBS (irritable bowel syndrome)   . Morbid obesity with BMI of 45.0-49.9, adult ([place])   . Sacral fracture [facility])     Social History   Socioeconomic History  . Marital status: Married    Spouse name: Not on file  . Number of children: 1  . Years of education: 73  . Highest education level: Not on file  Occupational History  . 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 Financial resource strain: Not on file  . Food insecurity:    Worry: Not on file    Inability: Not on file  . Transportation needs:    Medical: Not on file    Non-medical: Not on file  Tobacco Use  . Smoking status: Never Smoker  . Smokeless tobacco: Never Used  Substance and Sexual Activity  . Alcohol use: No    Alcohol/week: 0.0 standard drinks  . Drug use: No  . Sexual  activity: Yes    Birth control/protection: None  Lifestyle  . Physical activity:    Days per week: Not on file    Minutes per session: Not on file  . Stress: Not on file  Relationships  . Social connections:    Talks on phone: Not on file    Gets together: Not on file    Attends religious service: Not on file    Active member of club or organization: Not on file    Attends meetings of clubs or organizations: Not on file    Relationship status: Not on file  . Intimate partner violence:    Fear of current or ex partner: Not on file    Emotionally abused: Not on file    Physically abused: Not on file    Forced sexual activity: Not on file  Other Topics Concern  . 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 COLONOSCOPY    . DILATION AND EVACUATION  02/25/2012   Procedure: DILATATION AND EVACUATION;  Surgeon: Daria Pastures, MD;  Location: [facility];  Service: Gynecology;  Laterality: N/A;  . DILATION AND EVACUATION N/A 03/01/2013   Procedure: DILATATION AND EVACUATION;  Surgeon: Olga Millers, MD;  Location: [facility];  Service: Gynecology;  Laterality: N/A;  . TONSILLECTOMY AND ADENOIDECTOMY  2007    Family History  Problem Relation Age of Onset  . COPD Maternal Grandmother   . Dementia Maternal Grandmother   . Cancer Paternal Grandmother        blood cancer?  . Stroke Paternal Grandmother   . Alzheimer's disease Maternal Grandfather     Allergies  Allergen Reactions  . Latex Hives    Breathing problems  . 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 Shellfish Allergy Anaphylaxis  . Penicillins Hives    Has patient had a PCN reaction causing immediate rash, facial/tongue/throat swelling, SOB or lightheadedness with hypotension:  Yes Has patient had a PCN reaction causing severe rash involving mucus membranes or skin necrosis: No Has patient had a PCN reaction that required hospitalization No Has patient had a PCN reaction occurring within the last 10 years: No If all of the above answers are "NO", then may proceed with Cephalosporin use.   . Chocolate Itching  . Ciprofloxacin Hives  . Lamisil [Terbinafine] Hives  . Wheat Bran Itching    Current Outpatient Medications on File Prior to Visit  Medication Sig Dispense Refill  . albuterol (PROVENTIL HFA;VENTOLIN HFA) 108 (90 BASE) MCG/ACT inhaler Inhale 2 puffs into the lungs every 4 (four) hours as needed for wheezing. (Patient not taking: Reported on 12/07/2017) 1 Inhaler 0  . cetirizine (ZYRTEC) 10 MG tablet Take 10 mg by mouth daily.     . diphenhydrAMINE (BENADRYL) 25 MG tablet Take 25 mg by mouth as needed for allergies.     . fluticasone (FLONASE) 50 MCG/ACT nasal spray Place 2 sprays into both nostrils daily. (Patient taking differently: Place 2 sprays into both nostrils as needed. ) 16 g 6   No current facility-administered medications on file prior to visit.     There were no vitals taken for this visit.      Objective:   Physical Exam  Constitutional: She is oriented to person, place, and time. She appears well-developed and well-nourished. No distress.  Cardiovascular: Normal rate, regular rhythm, normal heart sounds and intact distal pulses.  Pulmonary/Chest: Effort normal and breath sounds normal.  Musculoskeletal: Normal range of motion.  Neurological: She is alert and oriented to person, place, and time.  Skin: Skin is warm and dry. She is not diaphoretic.  Psychiatric: She has a normal mood and affect. Her behavior is normal. Judgment and thought content normal.  Nursing note and vitals reviewed.     Assessment & Plan:  1. Other obesity - Will continue to Phentermine 15 mg. She has responded well to this. Follow up in one month  -  phentermine 15 MG capsule; Take 1 capsule (15 mg total) by mouth every morning.  Dispense: 30 capsule; Refill: 0  2. BMI 45.0-49.9, adult (HCC)  - phentermine 15 MG capsule; Take 1 capsule (15 mg total) by mouth every morning.  Dispense: 30 capsule; Refill: 0  [name], NP

## 2017-12-28 DIAGNOSIS — Z124 Encounter for screening for malignant neoplasm of cervix: Secondary | ICD-10-CM | POA: Diagnosis not present

## 2017-12-28 DIAGNOSIS — Z01419 Encounter for gynecological examination (general) (routine) without abnormal findings: Secondary | ICD-10-CM | POA: Diagnosis not present

## 2018-01-11 ENCOUNTER — Encounter: Payer: Self-pay | Admitting: Adult Health

## 2018-01-11 ENCOUNTER — Ambulatory Visit: Payer: 59 | Admitting: Adult Health

## 2018-01-11 VITALS — BP 124/76 | HR 85 | Temp 98.3°F | Wt 330.0 lb

## 2018-01-11 DIAGNOSIS — H9071 Mixed conductive and sensorineural hearing loss, unilateral, right ear, with unrestricted hearing on the contralateral side: Secondary | ICD-10-CM

## 2018-01-11 DIAGNOSIS — Z6841 Body Mass Index (BMI) 40.0 and over, adult: Secondary | ICD-10-CM

## 2018-01-11 DIAGNOSIS — E668 Other obesity: Secondary | ICD-10-CM | POA: Diagnosis not present

## 2018-01-11 MED ORDER — FLUTICASONE PROPIONATE 50 MCG/ACT NA SUSP: 2.0000 | Freq: Every day | NASAL | 6 refills | Status: AC

## 2018-01-11 MED ORDER — PHENTERMINE HCL 30 MG PO CAPS: 30.0000 mg | ORAL_CAPSULE | ORAL | 0 refills | Status: DC

## 2018-01-11 NOTE — Progress Notes (Signed)
Subjective:    Patient ID: Sydney Bradley, female    DOB: [birth year], 37 y.o.   MRN: [id number]  HPI  37 year old female who  has a past medical history of Arthritis, Asthma, Crohn's disease of ileum ([place]), Hypertension, IBS (irritable bowel syndrome), Morbid obesity with BMI of 45.0-49.9, adult ([place]), and Sacral fracture ([place]). Presents to the office today for follow-up regarding obesity and weight loss.  She is completing her second month of phentermine 15 mg.  She reports that the last month has been very stressful at work, her diet has stayed the same but she is not walking as much .She denies any side effects  She was only able to lose one pounds over the last month. She has lost 8 pounds total   Wt Readings from Last 3 Encounters:  01/11/18 (!) 330 lb (149.7 kg)  12/13/17 (!) 331 lb (150.1 kg)  12/07/17 (!) 334 lb 8 oz (151.7 kg)   Additionally, patient is concern with difficulty hearing. She reports that over the last year she has trouble hearing others. Per patient she has to turn up the volume on her phone all the way to hear a conversation, she has to turn her headphones up and she has a hard time hearing orders at work.   Review of Systems See HPI   Past Medical History:  Diagnosis Date  . Arthritis    L5 Back - no meds - otc prn  . Asthma   . Crohn's disease of ileum ([place])   . Hypertension    no meds x 3 yrs  . IBS (irritable bowel syndrome)   . Morbid obesity with BMI of 45.0-49.9, adult ([place])   . Sacral fracture [facility])     Social History   Socioeconomic History  . Marital status: Married    Spouse name: Not on file  . Number of children: 1  . Years of education: 19  . Highest education level: Not on file  Occupational History  . 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 Financial resource strain: Not on file  . Food insecurity:    Worry: Not on file    Inability: Not on file  . Transportation  needs:    Medical: Not on file    Non-medical: Not on file  Tobacco Use  . Smoking status: Never Smoker  . Smokeless tobacco: Never Used  Substance and Sexual Activity  . Alcohol use: No    Alcohol/week: 0.0 standard drinks  . Drug use: No  . Sexual activity: Yes    Birth control/protection: None  Lifestyle  . Physical activity:    Days per week: Not on file    Minutes per session: Not on file  . Stress: Not on file  Relationships  . Social connections:    Talks on phone: Not on file    Gets together: Not on file    Attends religious service: Not on file    Active member of club or organization: Not on file    Attends meetings of clubs or organizations: Not on file    Relationship status: Not on file  . Intimate partner violence:    Fear of current or ex partner: Not on file    Emotionally abused: Not on file    Physically abused: Not on file    Forced sexual activity: Not on file  Other Topics Concern  . 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 COLONOSCOPY    . DILATION AND EVACUATION  02/25/2012   Procedure: DILATATION AND EVACUATION;  Surgeon: Daria Pastures, MD;  Location: [facility];  Service: Gynecology;  Laterality: N/A;  . DILATION AND EVACUATION N/A 03/01/2013   Procedure: DILATATION AND EVACUATION;  Surgeon: Olga Millers, MD;  Location: [facility];  Service: Gynecology;  Laterality: N/A;  . TONSILLECTOMY AND ADENOIDECTOMY  2007    Family History  Problem Relation Age of Onset  . COPD Maternal Grandmother   . Dementia Maternal Grandmother   . Cancer Paternal Grandmother        blood cancer?  . Stroke Paternal Grandmother   . Alzheimer's disease Maternal Grandfather     Allergies  Allergen Reactions  . Latex Hives    Breathing problems  .  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 Shellfish Allergy Anaphylaxis  . Penicillins Hives    Has patient had a PCN reaction causing immediate rash, facial/tongue/throat swelling, SOB or lightheadedness with hypotension: Yes Has patient had a PCN reaction causing severe rash involving mucus membranes or skin necrosis: No Has patient had a PCN reaction that required hospitalization No Has patient had a PCN reaction occurring within the last 10 years: No If all of the above answers are "NO", then may proceed with Cephalosporin use.   . Chocolate Itching  . Ciprofloxacin Hives  . Lamisil [Terbinafine] Hives  . Wheat Bran Itching    Current Outpatient Medications on File Prior to Visit  Medication Sig Dispense Refill  . albuterol (PROVENTIL HFA;VENTOLIN HFA) 108 (90 BASE) MCG/ACT inhaler Inhale 2 puffs into the lungs every 4 (four) hours as needed for wheezing. 1 Inhaler 0  . cetirizine (ZYRTEC) 10 MG tablet Take 10 mg by mouth daily.     . diphenhydrAMINE (BENADRYL) 25 MG tablet Take 25 mg by mouth as needed for allergies.     . fluticasone (FLONASE) 50 MCG/ACT nasal spray Place 2 sprays into both nostrils daily. 16 g 6  . phentermine 15 MG capsule Take 1 capsule (15 mg total) by mouth every morning. 30 capsule 0   No current facility-administered medications on file prior to visit.     BP 124/76   Pulse 85   Temp 98.3 F (36.8 C)   Wt (!) 330 lb (149.7 kg)   BMI 48.73 kg/m       Objective:   Physical Exam  Constitutional: She is oriented to person, place, and time. She appears well-developed and well-nourished. No distress.  HENT:  Right Ear: Hearing, tympanic membrane, external ear and ear canal normal. Tympanic membrane is not scarred, not perforated and not erythematous. No middle ear effusion. No decreased hearing is noted.  Left Ear: Tympanic membrane, external ear and ear canal normal. Tympanic membrane is not scarred, not perforated and  not erythematous.  No middle ear effusion. Decreased hearing is noted.  + Rhine test bilaterally L>R Weber test L>R  Cardiovascular: Normal rate, regular rhythm, normal heart sounds and intact distal pulses.  Pulmonary/Chest: Effort normal and breath sounds normal.  Neurological: She is alert and oriented to person, place, and time.  Skin: Skin is warm and dry. She is not diaphoretic.  Psychiatric: She has a normal mood and affect. Her behavior is normal. Judgment and thought content normal.  Nursing note and vitals reviewed.     Assessment & Plan:  1. Other obesity - Will increase phentermine to 30 mg. Encouraged to use My Fitness Pal App to tract calories. Needs to exercise more.  - phentermine 30 MG capsule; Take 1 capsule (30 mg total) by mouth every morning.  Dispense: 30 capsule; Refill: 0 - Follow up in one month  2. BMI 45.0-49.9, adult (HCC)  - phentermine 30 MG capsule; Take 1 capsule (30 mg total) by mouth every morning.  Dispense: 30 capsule; Refill: 0  3. Mixed conductive and sensorineural hearing loss of right ear, unspecified hearing status on contralateral side  - Ambulatory referral to Audiology  [name], NP

## 2018-02-17 ENCOUNTER — Encounter: Payer: Self-pay | Admitting: Adult Health

## 2018-02-17 ENCOUNTER — Ambulatory Visit: Payer: 59 | Admitting: Adult Health

## 2018-02-17 DIAGNOSIS — Z6841 Body Mass Index (BMI) 40.0 and over, adult: Secondary | ICD-10-CM

## 2018-02-17 DIAGNOSIS — E668 Other obesity: Secondary | ICD-10-CM

## 2018-02-17 MED ORDER — PHENTERMINE HCL 30 MG PO CAPS: 30.0000 mg | ORAL_CAPSULE | ORAL | 0 refills | Status: DC

## 2018-02-17 NOTE — Progress Notes (Signed)
Subjective:    Patient ID: Sydney Bradley, female    DOB: 1981/03/18, 37 y.o.   MRN: [id number]  HPI 37 year old female who  has a past medical history of Arthritis, Asthma, Crohn's disease of ileum ([place]), Hypertension, IBS (irritable bowel syndrome), Morbid obesity with BMI of 45.0-49.9, adult ([place]), and Sacral fracture ([place]). She presents to the office today for one-month follow-up regarding weight loss.  During her last visit she had only lost 1 pound, at this point phentermine was increased from 15 mg to 30 mg daily.  This would be her third month using phentermine  She has been eating healthier, cutting back on soda and sweet tea and eating better foods. She is exercising more frequently and has cut back on serving sizes. She has been able to lose 9 pounds this month and 17 pounds overall.   She has noticed that she is feeling better overall, her clothes are looser, and she has more energy.   She denies any side effects such as insomnia or palpitations with the increase in phentermine   Wt Readings from Last 3 Encounters:  02/17/18 (!) 321 lb 4.8 oz (145.7 kg)  01/11/18 (!) 330 lb (149.7 kg)  12/13/17 (!) 331 lb (150.1 kg)    Review of Systems See HPI   Past Medical History:  Diagnosis Date  . Arthritis    L5 Back - no meds - otc prn  . Asthma   . Crohn's disease of ileum ([place])   . Hypertension    no meds x 3 yrs  . IBS (irritable bowel syndrome)   . Morbid obesity with BMI of 45.0-49.9, adult ([place])   . Sacral fracture [facility])     Social History   Socioeconomic History  . Marital status: Married    Spouse name: Not on file  . Number of children: 1  . Years of education: 58  . Highest education level: Not on file  Occupational History  . 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 Financial resource strain: Not on file  . Food insecurity:    Worry: Not on file    Inability: Not on file  . Transportation  needs:    Medical: Not on file    Non-medical: Not on file  Tobacco Use  . Smoking status: Never Smoker  . Smokeless tobacco: Never Used  Substance and Sexual Activity  . Alcohol use: No    Alcohol/week: 0.0 standard drinks  . Drug use: No  . Sexual activity: Yes    Birth control/protection: None  Lifestyle  . Physical activity:    Days per week: Not on file    Minutes per session: Not on file  . Stress: Not on file  Relationships  . Social connections:    Talks on phone: Not on file    Gets together: Not on file    Attends religious service: Not on file    Active member of club or organization: Not on file    Attends meetings of clubs or organizations: Not on file    Relationship status: Not on file  . Intimate partner violence:    Fear of current or ex partner: Not on file    Emotionally abused: Not on file    Physically abused: Not on file    Forced sexual activity: Not on file  Other Topics Concern  . 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 COLONOSCOPY    . DILATION AND EVACUATION  02/25/2012   Procedure: DILATATION AND EVACUATION;  Surgeon: Daria Pastures, MD;  Location: [facility];  Service: Gynecology;  Laterality: N/A;  . DILATION AND EVACUATION N/A 03/01/2013   Procedure: DILATATION AND EVACUATION;  Surgeon: Olga Millers, MD;  Location: [facility];  Service: Gynecology;  Laterality: N/A;  . TONSILLECTOMY AND ADENOIDECTOMY  2007    Family History  Problem Relation Age of Onset  . COPD Maternal Grandmother   . Dementia Maternal Grandmother   . Cancer Paternal Grandmother        blood cancer?  . Stroke Paternal Grandmother   . Alzheimer's disease Maternal Grandfather     Allergies  Allergen Reactions  . Latex Hives    Breathing problems  .  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 Shellfish Allergy Anaphylaxis  . Penicillins Hives    Has patient had a PCN reaction causing immediate rash, facial/tongue/throat swelling, SOB or lightheadedness with hypotension: Yes Has patient had a PCN reaction causing severe rash involving mucus membranes or skin necrosis: No Has patient had a PCN reaction that required hospitalization No Has patient had a PCN reaction occurring within the last 10 years: No If all of the above answers are "NO", then may proceed with Cephalosporin use.   . Chocolate Itching  . Ciprofloxacin Hives  . Lamisil [Terbinafine] Hives  . Wheat Bran Itching    Current Outpatient Medications on File Prior to Visit  Medication Sig Dispense Refill  . albuterol (PROVENTIL HFA;VENTOLIN HFA) 108 (90 BASE) MCG/ACT inhaler Inhale 2 puffs into the lungs every 4 (four) hours as needed for wheezing. 1 Inhaler 0  . cetirizine (ZYRTEC) 10 MG tablet Take 10 mg by mouth daily.     . diphenhydrAMINE (BENADRYL) 25 MG tablet Take 25 mg by mouth as needed for allergies.     . fluticasone (FLONASE) 50 MCG/ACT nasal spray Place 2 sprays into both nostrils daily. 16 g 6  . phentermine 30 MG capsule Take 1 capsule (30 mg total) by mouth every morning. 30 capsule 0   No current facility-administered medications on file prior to visit.     BP 110/80 (BP Location: Left Wrist, Patient Position: Sitting, Cuff Size: Normal)   Temp 98 F (36.7 C) (Oral)   Wt (!) 321 lb 4.8 oz (145.7 kg)   BMI 47.45 kg/m       Objective:   Physical Exam  Constitutional: She is oriented to person, place, and time. She appears well-developed and well-nourished. No distress.  HENT:  Head: Normocephalic and atraumatic.  Right Ear: External ear normal.  Left Ear: External ear normal.  Nose: Nose normal.  Mouth/Throat: Oropharynx is clear and moist. No oropharyngeal exudate.  Cardiovascular: Normal rate, regular rhythm,  normal heart sounds and intact distal pulses. Exam reveals no gallop and no friction rub.  No murmur heard. Pulmonary/Chest: Effort normal and breath sounds normal.  Musculoskeletal: Normal range of motion. She exhibits no edema, tenderness or deformity.  Neurological: She is alert and oriented to person, place, and time. She displays normal reflexes. No cranial nerve deficit or sensory deficit. She exhibits normal muscle tone. Coordination normal.  Skin: Skin is  warm and dry. Capillary refill takes less than 2 seconds. No rash noted. She is not diaphoretic. No erythema. No pallor.  Psychiatric: She has a normal mood and affect. Her behavior is normal. Judgment and thought content normal.  Nursing note and vitals reviewed.     Assessment & Plan:  1. Other obesity - one month follow up  - Congratulated on weight loss and life style modifications  - phentermine 30 MG capsule; Take 1 capsule (30 mg total) by mouth every morning.  Dispense: 30 capsule; Refill: 0  2. BMI 45.0-49.9, adult (HCC)  - phentermine 30 MG capsule; Take 1 capsule (30 mg total) by mouth every morning.  Dispense: 30 capsule; Refill: 0  [name], NP

## 2018-02-20 ENCOUNTER — Other Ambulatory Visit: Payer: Self-pay | Admitting: *Deleted

## 2018-02-20 DIAGNOSIS — Z6841 Body Mass Index (BMI) 40.0 and over, adult: Secondary | ICD-10-CM

## 2018-02-20 DIAGNOSIS — E668 Other obesity: Secondary | ICD-10-CM

## 2018-02-20 MED ORDER — PHENTERMINE HCL 30 MG PO CAPS: 30.0000 mg | ORAL_CAPSULE | ORAL | 0 refills | Status: DC

## 2018-02-21 ENCOUNTER — Other Ambulatory Visit: Payer: Self-pay | Admitting: Adult Health

## 2018-02-21 ENCOUNTER — Telehealth: Payer: Self-pay | Admitting: Family Medicine

## 2018-02-21 DIAGNOSIS — E668 Other obesity: Secondary | ICD-10-CM

## 2018-02-21 DIAGNOSIS — Z6841 Body Mass Index (BMI) 40.0 and over, adult: Secondary | ICD-10-CM

## 2018-02-21 MED ORDER — PHENTERMINE HCL 30 MG PO CAPS: 30.0000 mg | ORAL_CAPSULE | ORAL | 0 refills | Status: DC

## 2018-02-21 NOTE — Telephone Encounter (Signed)
Copied from [place] [id number]. Topic: General - Other >> Feb 17, 2018 12:58 PM Yvette Rack wrote: Reason for CRM: Pt states the Rx for phentermine 30 MG capsule was sent to wrong pharmacy. Pt states the Rx should have been sent to [facility] [address], [place] [phone] (Phone)  [phone] (Fax) >> Feb 17, 2018  5:26 PM Keene Breath wrote: Patient called to get the status of her medication which was sent to the wrong pharmacy.  Please call patient once the medication has been sent to the correct pharmacy.  CB# [phone]

## 2018-02-21 NOTE — Telephone Encounter (Signed)
Sent to [facility]

## 2018-02-21 NOTE — Telephone Encounter (Signed)
Sydney Bradley, I called [facility] and cancelled phentermine.  Please e-scribe to [employer]

## 2018-03-17 ENCOUNTER — Ambulatory Visit: Payer: 59 | Admitting: Adult Health

## 2018-03-21 ENCOUNTER — Ambulatory Visit: Payer: 59 | Admitting: Adult Health

## 2018-03-21 NOTE — Progress Notes (Deleted)
Subjective:    Patient ID: Sydney Bradley, female    DOB: 02-09-1981, 37 y.o.   MRN: [id number]  HPI 37 year old female who  has a past medical history of Arthritis, Asthma, Crohn's disease of ileum ([place]), Hypertension, IBS (irritable bowel syndrome), Morbid obesity with BMI of 45.0-49.9, adult ([place]), and Sacral fracture ([place]).  He presents to the office today for one-month follow-up regarding weight loss.  This would be her fourth month using phentermine.  She denies chest pain, shortness of breath, palpitations, or insomnia.  She is currently maintained on phentermine 30 mg tablets.  She continues to work on heart healthy diet with portion control and is exercising more frequently.  Wt Readings from Last 3 Encounters:  02/17/18 (!) 321 lb 4.8 oz (145.7 kg)  01/11/18 (!) 330 lb (149.7 kg)  12/13/17 (!) 331 lb (150.1 kg)    Review of Systems See HPI   Past Medical History:  Diagnosis Date  . Arthritis    L5 Back - no meds - otc prn  . Asthma   . Crohn's disease of ileum ([place])   . Hypertension    no meds x 3 yrs  . IBS (irritable bowel syndrome)   . Morbid obesity with BMI of 45.0-49.9, adult ([place])   . Sacral fracture [facility])     Social History   Socioeconomic History  . Marital status: Married    Spouse name: Not on file  . Number of children: 1  . Years of education: 47  . Highest education level: Not on file  Occupational History  . 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 Financial resource strain: Not on file  . Food insecurity:    Worry: Not on file    Inability: Not on file  . Transportation needs:    Medical: Not on file    Non-medical: Not on file  Tobacco Use  . Smoking status: Never Smoker  . Smokeless tobacco: Never Used  Substance and Sexual Activity  . Alcohol use: No    Alcohol/week: 0.0 standard drinks  . Drug use: No  . Sexual activity: Yes    Birth control/protection: None    Lifestyle  . Physical activity:    Days per week: Not on file    Minutes per session: Not on file  . Stress: Not on file  Relationships  . Social connections:    Talks on phone: Not on file    Gets together: Not on file    Attends religious service: Not on file    Active member of club or organization: Not on file    Attends meetings of clubs or organizations: Not on file    Relationship status: Not on file  . Intimate partner violence:    Fear of current or ex partner: Not on file    Emotionally abused: Not on file    Physically abused: Not on file    Forced sexual activity: Not on file  Other Topics Concern  . 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 COLONOSCOPY    . DILATION AND EVACUATION  02/25/2012   Procedure: DILATATION AND EVACUATION;  Surgeon: Daria Pastures,  MD;  Location: [facility];  Service: Gynecology;  Laterality: N/A;  . DILATION AND EVACUATION N/A 03/01/2013   Procedure: DILATATION AND EVACUATION;  Surgeon: Olga Millers, MD;  Location: [facility];  Service: Gynecology;  Laterality: N/A;  . TONSILLECTOMY AND ADENOIDECTOMY  2007    Family History  Problem Relation Age of Onset  . COPD Maternal Grandmother   . Dementia Maternal Grandmother   . Cancer Paternal Grandmother        blood cancer?  . Stroke Paternal Grandmother   . Alzheimer's disease Maternal Grandfather     Allergies  Allergen Reactions  . Latex Hives    Breathing problems  . 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 Shellfish Allergy Anaphylaxis  . Penicillins Hives    Has patient had a PCN reaction causing immediate rash, facial/tongue/throat swelling, SOB or lightheadedness with hypotension: Yes Has patient had a PCN reaction causing severe  rash involving mucus membranes or skin necrosis: No Has patient had a PCN reaction that required hospitalization No Has patient had a PCN reaction occurring within the last 10 years: No If all of the above answers are "NO", then may proceed with Cephalosporin use.   . Chocolate Itching  . Ciprofloxacin Hives  . Lamisil [Terbinafine] Hives  . Wheat Bran Itching    Current Outpatient Medications on File Prior to Visit  Medication Sig Dispense Refill  . albuterol (PROVENTIL HFA;VENTOLIN HFA) 108 (90 BASE) MCG/ACT inhaler Inhale 2 puffs into the lungs every 4 (four) hours as needed for wheezing. 1 Inhaler 0  . cetirizine (ZYRTEC) 10 MG tablet Take 10 mg by mouth daily.     . diphenhydrAMINE (BENADRYL) 25 MG tablet Take 25 mg by mouth as needed for allergies.     . fluticasone (FLONASE) 50 MCG/ACT nasal spray Place 2 sprays into both nostrils daily. 16 g 6  . phentermine 30 MG capsule Take 1 capsule (30 mg total) by mouth every morning. 30 capsule 0   No current facility-administered medications on file prior to visit.     There were no vitals taken for this visit.      Objective:   Physical Exam  Constitutional: She is oriented to person, place, and time. She appears well-developed and well-nourished. No distress.  Cardiovascular: Normal rate, regular rhythm, normal heart sounds and intact distal pulses.  Pulmonary/Chest: Effort normal and breath sounds normal.  Abdominal: Soft. Bowel sounds are normal. She exhibits no distension and no mass. There is no tenderness. There is no rebound and no guarding. No hernia.  Neurological: She is alert and oriented to person, place, and time.  Skin: Skin is warm and dry. Capillary refill takes less than 2 seconds. She is not diaphoretic.  Psychiatric: She has a normal mood and affect. Her behavior is normal. Judgment and thought content normal.  Nursing note and vitals reviewed.         Assessment & Plan:

## 2018-05-09 ENCOUNTER — Encounter: Payer: Self-pay | Admitting: Adult Health

## 2018-05-09 ENCOUNTER — Ambulatory Visit: Payer: 59 | Admitting: Adult Health

## 2018-05-09 VITALS — BP 110/80 | Temp 98.4°F | Wt 321.0 lb

## 2018-05-09 DIAGNOSIS — M79672 Pain in left foot: Secondary | ICD-10-CM

## 2018-05-09 DIAGNOSIS — Z6841 Body Mass Index (BMI) 40.0 and over, adult: Secondary | ICD-10-CM

## 2018-05-09 DIAGNOSIS — E668 Other obesity: Secondary | ICD-10-CM | POA: Diagnosis not present

## 2018-05-09 MED ORDER — PHENTERMINE HCL 30 MG PO CAPS: 30.0000 mg | ORAL_CAPSULE | ORAL | 0 refills | Status: DC

## 2018-05-09 NOTE — Progress Notes (Signed)
Subjective:    Patient ID: Sydney Bradley, female    DOB: 1980/10/14, 38 y.o.   MRN: [id number]  HPI  38 year old female who  has a past medical history of Arthritis, Asthma, Crohn's disease of ileum ([place]), Hypertension, IBS (irritable bowel syndrome), Morbid obesity with BMI of 45.0-49.9, adult ([place]), and Sacral fracture ([place]).  She presents to the office today for the complaint of left heel pain x 1 month. Pain is worse in the morning when she gets out of bed and when ambulating. Pain is felt as " sharp". Pain does not radiate. She denies injury, redness, warmth, or bruising.   Additionally, she would like to restart phentermine. She has been without the medication for two months and was trying to lose weight with diet and exercise over the holidays. She has been able to maintain her weight but has not been able to lose weight.    Wt Readings from Last 3 Encounters:  05/09/18 (!) 321 lb (145.6 kg)  02/17/18 (!) 321 lb 4.8 oz (145.7 kg)  01/11/18 (!) 330 lb (149.7 kg)    Review of Systems See HPI   Past Medical History:  Diagnosis Date  . Arthritis    L5 Back - no meds - otc prn  . Asthma   . Crohn's disease of ileum ([place])   . Hypertension    no meds x 3 yrs  . IBS (irritable bowel syndrome)   . Morbid obesity with BMI of 45.0-49.9, adult ([place])   . Sacral fracture [facility])     Social History   Socioeconomic History  . Marital status: Married    Spouse name: Not on file  . Number of children: 1  . Years of education: 33  . Highest education level: Not on file  Occupational History  . 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 Financial resource strain: Not on file  . Food insecurity:    Worry: Not on file    Inability: Not on file  . Transportation needs:    Medical: Not on file    Non-medical: Not on file  Tobacco Use  . Smoking status: Never Smoker  . Smokeless tobacco: Never Used  Substance and Sexual  Activity  . Alcohol use: No    Alcohol/week: 0.0 standard drinks  . Drug use: No  . Sexual activity: Yes    Birth control/protection: None  Lifestyle  . Physical activity:    Days per week: Not on file    Minutes per session: Not on file  . Stress: Not on file  Relationships  . Social connections:    Talks on phone: Not on file    Gets together: Not on file    Attends religious service: Not on file    Active member of club or organization: Not on file    Attends meetings of clubs or organizations: Not on file    Relationship status: Not on file  . Intimate partner violence:    Fear of current or ex partner: Not on file    Emotionally abused: Not on file    Physically abused: Not on file    Forced sexual activity: Not on file  Other Topics Concern  . 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 COLONOSCOPY    . DILATION AND EVACUATION  02/25/2012   Procedure: DILATATION AND EVACUATION;  Surgeon: Daria Pastures, MD;  Location: [facility];  Service: Gynecology;  Laterality: N/A;  . DILATION AND EVACUATION N/A 03/01/2013   Procedure: DILATATION AND EVACUATION;  Surgeon: Olga Millers, MD;  Location: [facility];  Service: Gynecology;  Laterality: N/A;  . TONSILLECTOMY AND ADENOIDECTOMY  2007    Family History  Problem Relation Age of Onset  . COPD Maternal Grandmother   . Dementia Maternal Grandmother   . Cancer Paternal Grandmother        blood cancer?  . Stroke Paternal Grandmother   . Alzheimer's disease Maternal Grandfather     Allergies  Allergen Reactions  . Latex Hives    Breathing problems  . 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 Shellfish Allergy Anaphylaxis  . Penicillins Hives    Has patient had a PCN reaction  causing immediate rash, facial/tongue/throat swelling, SOB or lightheadedness with hypotension: Yes Has patient had a PCN reaction causing severe rash involving mucus membranes or skin necrosis: No Has patient had a PCN reaction that required hospitalization No Has patient had a PCN reaction occurring within the last 10 years: No If all of the above answers are "NO", then may proceed with Cephalosporin use.   . Chocolate Itching  . Ciprofloxacin Hives  . Lamisil [Terbinafine] Hives  . Wheat Bran Itching    Current Outpatient Medications on File Prior to Visit  Medication Sig Dispense Refill  . albuterol (PROVENTIL HFA;VENTOLIN HFA) 108 (90 BASE) MCG/ACT inhaler Inhale 2 puffs into the lungs every 4 (four) hours as needed for wheezing. 1 Inhaler 0  . cetirizine (ZYRTEC) 10 MG tablet Take 10 mg by mouth daily.     . diphenhydrAMINE (BENADRYL) 25 MG tablet Take 25 mg by mouth as needed for allergies.     . fluticasone (FLONASE) 50 MCG/ACT nasal spray Place 2 sprays into both nostrils daily. 16 g 6  . phentermine 30 MG capsule Take 1 capsule (30 mg total) by mouth every morning. 30 capsule 0   No current facility-administered medications on file prior to visit.     BP 110/80   Temp 98.4 F (36.9 C)   Wt (!) 321 lb (145.6 kg)   BMI 47.40 kg/m       Objective:   Physical Exam Vitals signs and nursing note reviewed.  Constitutional:      Appearance: Normal appearance. She is obese.  Neck:     Musculoskeletal: Normal range of motion and neck supple.  Cardiovascular:     Rate and Rhythm: Normal rate and regular rhythm.     Pulses: Normal pulses.     Heart sounds: Normal heart sounds.  Pulmonary:     Effort: Pulmonary effort is normal.     Breath sounds: Normal breath sounds.  Musculoskeletal:        General: Tenderness (with palpation along left heel) present. No swelling, deformity or signs of injury.     Right lower leg: No edema.     Left lower leg: No edema.     Comments:  No pain with planter or dorsal flexion.  Has pain along planter fascia   Skin:    General: Skin is warm and dry.  Neurological:     General: No focal deficit present.  Mental Status: She is alert and oriented to person, place, and time.       Assessment & Plan:  1. Pain of left heel - Likely planter fascial pain. Advised to freeze a water bottle and rub against foot during rest  - Take motrin PRN  - Heel lifts  - Follow up if no improvement in the next week; will consider xray at that time   2. BMI 45.0-49.9, adult (HCC)  - phentermine 30 MG capsule; Take 1 capsule (30 mg total) by mouth every morning for 30 days.  Dispense: 30 capsule; Refill: 0 - Follow up in 30 days    [name], NP

## 2018-06-09 ENCOUNTER — Encounter: Payer: Self-pay | Admitting: Adult Health

## 2018-06-09 ENCOUNTER — Ambulatory Visit: Payer: 59 | Admitting: Adult Health

## 2018-06-09 ENCOUNTER — Ambulatory Visit (INDEPENDENT_AMBULATORY_CARE_PROVIDER_SITE_OTHER): Payer: 59

## 2018-06-09 VITALS — BP 102/62 | HR 94 | Temp 97.5°F | Ht 69.0 in | Wt 316.8 lb

## 2018-06-09 DIAGNOSIS — M79672 Pain in left foot: Secondary | ICD-10-CM

## 2018-06-09 DIAGNOSIS — E668 Other obesity: Secondary | ICD-10-CM

## 2018-06-09 DIAGNOSIS — M7732 Calcaneal spur, left foot: Secondary | ICD-10-CM | POA: Diagnosis not present

## 2018-06-09 DIAGNOSIS — Z6841 Body Mass Index (BMI) 40.0 and over, adult: Secondary | ICD-10-CM

## 2018-06-09 MED ORDER — PHENTERMINE HCL 30 MG PO CAPS: 30.0000 mg | ORAL_CAPSULE | ORAL | 0 refills | Status: DC

## 2018-06-09 NOTE — Progress Notes (Signed)
Subjective:    Patient ID: Sydney Bradley, female    DOB: 11/07/1980, 38 y.o.   MRN: [id number]  HPI  38 year old female who  has a past medical history of Arthritis, Asthma, Crohn's disease of ileum ([place]), Hypertension, IBS (irritable bowel syndrome), Morbid obesity with BMI of 45.0-49.9, adult ([place]), and Sacral fracture ([place]). He presents to the office today for one-month follow-up after restarting phentermine 30 mg tablets to help and weight loss.  She reports that over the month she has not experienced any side effects of phentermine such as palpitations or insomnia.  She continues to workout is eating a heart healthy diet  Wt Readings from Last 3 Encounters:  06/09/18 (!) 316 lb 12.8 oz (143.7 kg)  05/09/18 (!) 321 lb (145.6 kg)  02/17/18 (!) 321 lb 4.8 oz (145.7 kg)   She continues to have left foot pain in her heel. During the last visit it was through the she was experiencing planter fascitis. He was advised to stretch and ice. She reports today that her pain has improved but minimally. Denies any recent trauma.    Review of Systems See HPI   Past Medical History:  Diagnosis Date  . Arthritis    L5 Back - no meds - otc prn  . Asthma   . Crohn's disease of ileum ([place])   . Hypertension    no meds x 3 yrs  . IBS (irritable bowel syndrome)   . Morbid obesity with BMI of 45.0-49.9, adult ([place])   . Sacral fracture [facility])     Social History   Socioeconomic History  . Marital status: Married    Spouse name: Not on file  . Number of children: 1  . Years of education: 46  . Highest education level: Not on file  Occupational History  . 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 Financial resource strain: Not on file  . Food insecurity:    Worry: Not on file    Inability: Not on file  . Transportation needs:    Medical: Not on file    Non-medical: Not on file  Tobacco Use  . Smoking status: Never Smoker  .  Smokeless tobacco: Never Used  Substance and Sexual Activity  . Alcohol use: No    Alcohol/week: 0.0 standard drinks  . Drug use: No  . Sexual activity: Yes    Birth control/protection: None  Lifestyle  . Physical activity:    Days per week: Not on file    Minutes per session: Not on file  . Stress: Not on file  Relationships  . Social connections:    Talks on phone: Not on file    Gets together: Not on file    Attends religious service: Not on file    Active member of club or organization: Not on file    Attends meetings of clubs or organizations: Not on file    Relationship status: Not on file  . Intimate partner violence:    Fear of current or ex partner: Not on file    Emotionally abused: Not on file    Physically abused: Not on file    Forced sexual activity: Not on file  Other Topics Concern  . 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 COLONOSCOPY    . DILATION AND EVACUATION  02/25/2012   Procedure: DILATATION AND EVACUATION;  Surgeon: Daria Pastures, MD;  Location: [facility];  Service: Gynecology;  Laterality: N/A;  . DILATION AND EVACUATION N/A 03/01/2013   Procedure: DILATATION AND EVACUATION;  Surgeon: Olga Millers, MD;  Location: [facility];  Service: Gynecology;  Laterality: N/A;  . TONSILLECTOMY AND ADENOIDECTOMY  2007    Family History  Problem Relation Age of Onset  . COPD Maternal Grandmother   . Dementia Maternal Grandmother   . Cancer Paternal Grandmother        blood cancer?  . Stroke Paternal Grandmother   . Alzheimer's disease Maternal Grandfather     Allergies  Allergen Reactions  . Latex Hives    Breathing problems  . 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 Shellfish Allergy Anaphylaxis  .  Penicillins Hives    Has patient had a PCN reaction causing immediate rash, facial/tongue/throat swelling, SOB or lightheadedness with hypotension: Yes Has patient had a PCN reaction causing severe rash involving mucus membranes or skin necrosis: No Has patient had a PCN reaction that required hospitalization No Has patient had a PCN reaction occurring within the last 10 years: No If all of the above answers are "NO", then may proceed with Cephalosporin use.   . Chocolate Itching  . Ciprofloxacin Hives  . Lamisil [Terbinafine] Hives  . Wheat Bran Itching    Current Outpatient Medications on File Prior to Visit  Medication Sig Dispense Refill  . albuterol (PROVENTIL HFA;VENTOLIN HFA) 108 (90 BASE) MCG/ACT inhaler Inhale 2 puffs into the lungs every 4 (four) hours as needed for wheezing. 1 Inhaler 0  . cetirizine (ZYRTEC) 10 MG tablet Take 10 mg by mouth daily.     . diphenhydrAMINE (BENADRYL) 25 MG tablet Take 25 mg by mouth as needed for allergies.     . fluticasone (FLONASE) 50 MCG/ACT nasal spray Place 2 sprays into both nostrils daily. 16 g 6   No current facility-administered medications on file prior to visit.     BP 102/62 (BP Location: Left Arm, Patient Position: Sitting, Cuff Size: Large)   Pulse 94   Temp (!) 97.5 F (36.4 C) (Oral)   Ht 5' 9"  (1.753 m)   Wt (!) 316 lb 12.8 oz (143.7 kg)   BMI 46.78 kg/m       Objective:   Physical Exam Vitals signs and nursing note reviewed.  Constitutional:      Appearance: Normal appearance.  Cardiovascular:     Rate and Rhythm: Normal rate and regular rhythm.     Pulses: Normal pulses.     Heart sounds: Normal heart sounds.  Pulmonary:     Effort: Pulmonary effort is normal.     Breath sounds: Normal breath sounds.  Musculoskeletal:        General: Tenderness (left heel ) present.  Skin:    General: Skin is dry.     Capillary Refill: Capillary refill takes less than 2 seconds.  Neurological:     Mental Status: She is  alert.       Assessment & Plan:  1. Other obesity - lost 5 pounds over the last month  - Continue to diet and exercise  - Follow up in 1 month  - phentermine 30 MG capsule; Take 1 capsule (30 mg  total) by mouth every morning for 30 days.  Dispense: 30 capsule; Refill: 0  2. BMI 45.0-49.9, adult (HCC)  - phentermine 30 MG capsule; Take 1 capsule (30 mg total) by mouth every morning for 30 days.  Dispense: 30 capsule; Refill: 0  3. Pain of left heel - Will get xray to r/o bone spur.  - Continue to ice  - DG Foot Complete Left; Future  [employer]

## 2018-06-16 ENCOUNTER — Other Ambulatory Visit: Payer: Self-pay | Admitting: Family Medicine

## 2018-06-16 DIAGNOSIS — M79672 Pain in left foot: Secondary | ICD-10-CM

## 2018-06-16 DIAGNOSIS — M7732 Calcaneal spur, left foot: Secondary | ICD-10-CM

## 2018-07-14 ENCOUNTER — Ambulatory Visit (INDEPENDENT_AMBULATORY_CARE_PROVIDER_SITE_OTHER): Payer: 59 | Admitting: Adult Health

## 2018-07-14 ENCOUNTER — Other Ambulatory Visit: Payer: Self-pay

## 2018-07-14 ENCOUNTER — Encounter: Payer: Self-pay | Admitting: Adult Health

## 2018-07-14 VITALS — Wt 311.2 lb

## 2018-07-14 DIAGNOSIS — Z6841 Body Mass Index (BMI) 40.0 and over, adult: Secondary | ICD-10-CM

## 2018-07-14 DIAGNOSIS — E668 Other obesity: Secondary | ICD-10-CM

## 2018-07-14 MED ORDER — PHENTERMINE HCL 37.5 MG PO CAPS: 37.5000 mg | ORAL_CAPSULE | ORAL | 0 refills | Status: DC

## 2018-07-14 NOTE — Progress Notes (Signed)
Virtual Visit via Video Note  I connected with@ on 07/14/18 at  2:30 PM EDT by a video enabled telemedicine application and verified that I am speaking with the correct person using two identifiers.  Location patient: home Location provider:work or home office Persons participating in the virtual visit: patient, provider  I discussed the limitations of evaluation and management by telemedicine and the availability of in person appointments. The patient expressed understanding and agreed to proceed.   HPI:  38 year old female who is presenting today for follow-up regarding obesity and weight loss.  Currently she is prescribed phentermine 30 mg.  This is her third month on phentermine and she has been able to lose approximately 11 pounds.  She has lost 5 pounds over the last month.  She reports over the last month she is continuing to exercise but has been unable to get to the gym due to the gym is being closed from COVID-19.  She is walking and working outside in the garden.  She continues to eat healthy but has been eating more potato chips since having to be self quarantined.  She denies any side effects of phentermine such as palpitations or insomnia  Wt Readings from Last 3 Encounters:  07/14/18 (!) 311 lb 3.2 oz (141.2 kg)  06/09/18 (!) 316 lb 12.8 oz (143.7 kg)  05/09/18 (!) 321 lb (145.6 kg)   ROS: See pertinent positives and negatives per HPI.  Past Medical History:  Diagnosis Date  . Arthritis    L5 Back - no meds - otc prn  . Asthma   . Crohn's disease of ileum ([place])   . Hypertension    no meds x 3 yrs  . IBS (irritable bowel syndrome)   . Morbid obesity with BMI of 45.0-49.9, adult ([place])   . Sacral fracture [facility])     Past Surgical History:  Procedure Laterality Date  . COLONOSCOPY    . DILATION AND EVACUATION  02/25/2012   Procedure: DILATATION AND EVACUATION;  Surgeon: Daria Pastures, MD;  Location: [facility];  Service: Gynecology;  Laterality: N/A;  . DILATION  AND EVACUATION N/A 03/01/2013   Procedure: DILATATION AND EVACUATION;  Surgeon: Olga Millers, MD;  Location: [facility];  Service: Gynecology;  Laterality: N/A;  . TONSILLECTOMY AND ADENOIDECTOMY  2007    Family History  Problem Relation Age of Onset  . COPD Maternal Grandmother   . Dementia Maternal Grandmother   . Cancer Paternal Grandmother        blood cancer?  . Stroke Paternal Grandmother   . Alzheimer's disease Maternal Grandfather       Current Outpatient Medications:  .  albuterol (PROVENTIL HFA;VENTOLIN HFA) 108 (90 BASE) MCG/ACT inhaler, Inhale 2 puffs into the lungs every 4 (four) hours as needed for wheezing., Disp: 1 Inhaler, Rfl: 0 .  cetirizine (ZYRTEC) 10 MG tablet, Take 10 mg by mouth daily. , Disp: , Rfl:  .  diphenhydrAMINE (BENADRYL) 25 MG tablet, Take 25 mg by mouth as needed for allergies. , Disp: , Rfl:  .  fluticasone (FLONASE) 50 MCG/ACT nasal spray, Place 2 sprays into both nostrils daily., Disp: 16 g, Rfl: 6 .  phentermine 37.5 MG capsule, Take 1 capsule (37.5 mg total) by mouth every morning., Disp: 30 capsule, Rfl: 0  EXAM:  VITALS per patient if applicable:  GENERAL: alert, oriented, appears well and in no acute distress  HEENT: atraumatic, conjunttiva clear, no obvious abnormalities on inspection of external nose and ears  NECK: normal  movements of the head and neck  LUNGS: on inspection no signs of respiratory distress, breathing rate appears normal, no obvious gross SOB, gasping or wheezing  CV: no obvious cyanosis  MS: moves all visible extremities without noticeable abnormality  PSYCH/NEURO: pleasant and cooperative, no obvious depression or anxiety, speech and thought processing grossly intact  ASSESSMENT AND PLAN:  Discussed the following assessment and plan:  BMI 45.0-49.9, adult (HCC) - Plan: phentermine 37.5 MG capsule  Other obesity - Plan: phentermine 37.5 MG capsule  Increase phentermine from 30 mg to 37.5 mg.  Continue with  lifestyle modifications.  We will follow-up in 30 days for reevaluation   I discussed the assessment and treatment plan with the patient. The patient was provided an opportunity to ask questions and all were answered. The patient agreed with the plan and demonstrated an understanding of the instructions.   The patient was advised to call back or seek an in-person evaluation if the symptoms worsen or if the condition fails to improve as anticipated.   [name], NP

## 2018-07-25 ENCOUNTER — Other Ambulatory Visit: Payer: Self-pay

## 2018-07-25 ENCOUNTER — Ambulatory Visit: Payer: 59

## 2018-07-25 ENCOUNTER — Encounter: Payer: Self-pay | Admitting: *Deleted

## 2018-07-25 ENCOUNTER — Ambulatory Visit: Payer: 59 | Admitting: Podiatry

## 2018-07-25 ENCOUNTER — Encounter: Payer: Self-pay | Admitting: Podiatry

## 2018-07-25 ENCOUNTER — Other Ambulatory Visit: Payer: Self-pay | Admitting: *Deleted

## 2018-07-25 VITALS — Temp 98.1°F

## 2018-07-25 DIAGNOSIS — M722 Plantar fascial fibromatosis: Secondary | ICD-10-CM

## 2018-07-25 DIAGNOSIS — J45909 Unspecified asthma, uncomplicated: Secondary | ICD-10-CM | POA: Insufficient documentation

## 2018-07-25 MED ORDER — TRIAMCINOLONE ACETONIDE 10 MG/ML IJ SUSP
10.0000 mg | Freq: Once | INTRAMUSCULAR | Status: AC
  Administered 2018-07-25: 10:00:00 10 mg

## 2018-07-25 NOTE — Patient Instructions (Signed)

## 2018-07-28 DIAGNOSIS — M722 Plantar fascial fibromatosis: Secondary | ICD-10-CM | POA: Insufficient documentation

## 2018-07-28 NOTE — Progress Notes (Signed)
Subjective:   Patient ID: Sydney Bradley, female   DOB: 38 y.o.   MRN: [id number]   HPI 38 year old female presents the office with concern of the left heel pain.  She states this is been ongoing for the last couple months.  She states that wearing sneakers does help but she describes a throbbing pain to the spot of her heel.  Hurts with pressure and walking at times.  She has been willing to benefit with ice.  She has pain in the morning when she first gets up and also gets better with activity.  She previously had x-rays performed which did reveal a small bone spur she relates.  She has no other concerns today.   Review of Systems  All other systems reviewed and are negative.  Past Medical History:  Diagnosis Date  . Arthritis    L5 Back - no meds - otc prn  . Asthma   . Crohn's disease of ileum ([place])   . Hypertension    no meds x 3 yrs  . IBS (irritable bowel syndrome)   . Morbid obesity with BMI of 45.0-49.9, adult ([place])   . Sacral fracture [facility])     Past Surgical History:  Procedure Laterality Date  . COLONOSCOPY    . DILATION AND EVACUATION  02/25/2012   Procedure: DILATATION AND EVACUATION;  Surgeon: Daria Pastures, MD;  Location: [place] [facility];  Service: Gynecology;  Laterality: N/A;  . DILATION AND EVACUATION N/A 03/01/2013   Procedure: DILATATION AND EVACUATION;  Surgeon: Olga Millers, MD;  Location: [place] [facility];  Service: Gynecology;  Laterality: N/A;  . TONSILLECTOMY AND ADENOIDECTOMY  2007     Current Outpatient Medications:  .  albuterol (PROVENTIL HFA;VENTOLIN HFA) 108 (90 BASE) MCG/ACT inhaler, Inhale 2 puffs into the lungs every 4 (four) hours as needed for wheezing., Disp: 1 Inhaler, Rfl: 0 .  cetirizine (ZYRTEC) 10 MG tablet, Take 10 mg by mouth daily. , Disp: , Rfl:  .  diphenhydrAMINE (BENADRYL) 25 MG tablet, Take 25 mg by mouth as needed for allergies. , Disp: , Rfl:  .  fluconazole (DIFLUCAN) 150 MG tablet, Diflucan 150 mg tablet  Take 1 tablet every 72 hours by  oral route as directed., Disp: , Rfl:  .  fluticasone (FLONASE) 50 MCG/ACT nasal spray, Place 2 sprays into both nostrils daily., Disp: 16 g, Rfl: 6 .  phentermine 37.5 MG capsule, Take 1 capsule (37.5 mg total) by mouth every morning., Disp: 30 capsule, Rfl: 0  Allergies  Allergen Reactions  . Latex Hives    Breathing problems  . 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 Shellfish Allergy Anaphylaxis  . Penicillins Hives    Has patient had a PCN reaction causing immediate rash, facial/tongue/throat swelling, SOB or lightheadedness with hypotension: Yes Has patient had a PCN reaction causing severe rash involving mucus membranes or skin necrosis: No Has patient had a PCN reaction that required hospitalization No Has patient had a PCN reaction occurring within the last 10 years: No If all of the above answers are "NO", then may proceed with Cephalosporin use.   . Chocolate Itching  . Ciprofloxacin Hives  . Lamisil [Terbinafine] Hives  . Wheat Bran Itching         Objective:  Physical Exam  General: AAO x3, NAD  Dermatological: Skin is warm, dry and supple bilateral. Nails x 10 are well manicured; remaining integument appears unremarkable at this time. There are no open  sores, no preulcerative lesions, no rash or signs of infection present.  Vascular: Dorsalis Pedis artery and Posterior Tibial artery pedal pulses are 2/4 bilateral with immedate capillary fill time. Pedal hair growth present. No varicosities and no lower extremity edema present bilateral. There is no pain with calf compression, swelling, warmth, erythema.   Neruologic: Grossly intact via light touch bilateral. Vibratory intact via tuning fork bilateral. Protective threshold with Semmes Wienstein monofilament intact to all pedal sites bilateral.  Negative Tinel sign.  Musculoskeletal: Tenderness to palpation along the plantar medial tubercle of the calcaneus at the insertion of plantar  fascia on the left foot. There is no pain along the course of the plantar fascia within the arch of the foot. Plantar fascia appears to be intact. There is no pain with lateral compression of the calcaneus or pain with vibratory sensation. There is no pain along the course or insertion of the achilles tendon. No other areas of tenderness to bilateral lower extremities.  Gait: Unassisted, Nonantalgic.       Assessment:   Right heel pain, plantar fasciitis     Plan:  -Treatment options discussed including all alternatives, risks, and complications -Etiology of symptoms were discussed -Independently reviewed the x-rays that she had done previously.  I discussed the x-rays with her as well. -Steroid injection was performed.  See procedure note below. -Plantar fascial braces dispensed. -Discussed shoe modifications and orthotics -Stretching, ice exercises daily  Procedure: Injection Tendon/Ligament Discussed alternatives, risks, complications and verbal consent was obtained.  Location: LEFT plantar fascia at the glabrous junction; medial approach. Skin Prep: Alcohol Injectate: 0.5cc 0.5% marcaine plain, 0.5 cc 2% lidocaine plain and, 1 cc kenalog 10. Disposition: Patient tolerated procedure well. Injection site dressed with a band-aid.  Post-injection care was discussed and return precautions discussed.   Return in about 4 weeks (around 08/22/2018).  Trula Slade DPM

## 2018-08-03 ENCOUNTER — Other Ambulatory Visit: Payer: Self-pay

## 2018-08-03 ENCOUNTER — Encounter: Payer: Self-pay | Admitting: Adult Health

## 2018-08-03 ENCOUNTER — Ambulatory Visit (INDEPENDENT_AMBULATORY_CARE_PROVIDER_SITE_OTHER): Payer: 59 | Admitting: Adult Health

## 2018-08-03 DIAGNOSIS — J4531 Mild persistent asthma with (acute) exacerbation: Secondary | ICD-10-CM | POA: Diagnosis not present

## 2018-08-03 MED ORDER — ALBUTEROL SULFATE HFA 108 (90 BASE) MCG/ACT IN AERS: 2.0000 | INHALATION_SPRAY | RESPIRATORY_TRACT | 3 refills | Status: DC | PRN

## 2018-08-03 MED ORDER — PREDNISONE 20 MG PO TABS: 20.0000 mg | ORAL_TABLET | Freq: Every day | ORAL | 0 refills | Status: DC

## 2018-08-03 NOTE — Progress Notes (Signed)
Virtual Visit via Video Note  I connected with Sydney Bradley on 08/03/18 at 10:30 AM EDT by a video enabled telemedicine application and verified that I am speaking with the correct person using two identifiers.  Location patient: home Location provider:work or home office Persons participating in the virtual visit: patient, provider  I discussed the limitations of evaluation and management by telemedicine and the availability of in person appointments. The patient expressed understanding and agreed to proceed.   HPI: 38 year old female who is being evaluated today for concern of acute asthma exacerbation.  She reports that over the last week she is had a cough where she feels like she needs to clear her throat, and some mild shortness of breath and fatigue.  She denies any fever and does not feel as though she is wheezing.  Has been using Flonase for seasonal allergies and reports that her allergies have been very bad as well.  She is out of her rescue inhaler and would like a refill.   ROS: See pertinent positives and negatives per HPI.  Past Medical History:  Diagnosis Date  . Arthritis    L5 Back - no meds - otc prn  . Asthma   . Crohn's disease of ileum ([place])   . Hypertension    no meds x 3 yrs  . IBS (irritable bowel syndrome)   . Morbid obesity with BMI of 45.0-49.9, adult ([place])   . Sacral fracture [facility])     Past Surgical History:  Procedure Laterality Date  . COLONOSCOPY    . DILATION AND EVACUATION  02/25/2012   Procedure: DILATATION AND EVACUATION;  Surgeon: Daria Pastures, MD;  Location: [facility];  Service: Gynecology;  Laterality: N/A;  . DILATION AND EVACUATION N/A 03/01/2013   Procedure: DILATATION AND EVACUATION;  Surgeon: Olga Millers, MD;  Location: [place] [facility];  Service: Gynecology;  Laterality: N/A;  . TONSILLECTOMY AND ADENOIDECTOMY  2007    Family History  Problem Relation Age of Onset  . COPD Maternal Grandmother   . Dementia Maternal Grandmother   .  Cancer Paternal Grandmother        blood cancer?  . Stroke Paternal Grandmother   . Alzheimer's disease Maternal Grandfather      Current Outpatient Medications:  .  albuterol (VENTOLIN HFA) 108 (90 Base) MCG/ACT inhaler, Inhale 2 puffs into the lungs every 4 (four) hours as needed for wheezing., Disp: 1 Inhaler, Rfl: 3 .  cetirizine (ZYRTEC) 10 MG tablet, Take 10 mg by mouth daily. , Disp: , Rfl:  .  diphenhydrAMINE (BENADRYL) 25 MG tablet, Take 25 mg by mouth as needed for allergies. , Disp: , Rfl:  .  fluconazole (DIFLUCAN) 150 MG tablet, Diflucan 150 mg tablet  Take 1 tablet every 72 hours by oral route as directed., Disp: , Rfl:  .  fluticasone (FLONASE) 50 MCG/ACT nasal spray, Place 2 sprays into both nostrils daily., Disp: 16 g, Rfl: 6 .  phentermine 37.5 MG capsule, Take 1 capsule (37.5 mg total) by mouth every morning., Disp: 30 capsule, Rfl: 0 .  predniSONE (DELTASONE) 20 MG tablet, Take 1 tablet (20 mg total) by mouth daily with breakfast., Disp: 10 tablet, Rfl: 0  EXAM:  VITALS per patient if applicable:  GENERAL: alert, oriented, appears well and in no acute distress  HEENT: atraumatic, conjunttiva clear, no obvious abnormalities on inspection of external nose and ears  NECK: normal movements of the head and neck  LUNGS: on inspection no signs of respiratory distress,  breathing rate appears normal, no obvious gross SOB, gasping or wheezing  CV: no obvious cyanosis  MS: moves all visible extremities without noticeable abnormality  PSYCH/NEURO: pleasant and cooperative, no obvious depression or anxiety, speech and thought processing grossly intact  ASSESSMENT AND PLAN:  Discussed the following assessment and plan:  Mild persistent asthma with acute exacerbation - Plan: predniSONE (DELTASONE) 20 MG tablet, albuterol (VENTOLIN HFA) 108 (90 Base) MCG/ACT inhaler, DISCONTINUED: albuterol (VENTOLIN HFA) 108 (90 Base) MCG/ACT inhaler  Send in rescue inhaler and prednisone  20 mg tabs to take if rescue inhaler does not work to help relieve her symptoms.  She was advised to follow-up with worsening symptoms   I discussed the assessment and treatment plan with the patient. The patient was provided an opportunity to ask questions and all were answered. The patient agreed with the plan and demonstrated an understanding of the instructions.   The patient was advised to call back or seek an in-person evaluation if the symptoms worsen or if the condition fails to improve as anticipated.   Sydney Peng, NP

## 2018-08-08 DIAGNOSIS — B373 Candidiasis of vulva and vagina: Secondary | ICD-10-CM | POA: Diagnosis not present

## 2018-08-11 ENCOUNTER — Other Ambulatory Visit: Payer: Self-pay

## 2018-08-11 ENCOUNTER — Ambulatory Visit (INDEPENDENT_AMBULATORY_CARE_PROVIDER_SITE_OTHER): Payer: 59 | Admitting: Adult Health

## 2018-08-11 ENCOUNTER — Encounter: Payer: Self-pay | Admitting: Adult Health

## 2018-08-11 DIAGNOSIS — E668 Other obesity: Secondary | ICD-10-CM

## 2018-08-11 DIAGNOSIS — Z6841 Body Mass Index (BMI) 40.0 and over, adult: Secondary | ICD-10-CM

## 2018-08-11 MED ORDER — PHENTERMINE HCL 37.5 MG PO CAPS: 37.5000 mg | ORAL_CAPSULE | ORAL | 0 refills | Status: DC

## 2018-08-11 NOTE — Progress Notes (Signed)
Virtual Visit via Video Note  I connected with Sydney Bradley on 08/11/18 at 10:00 AM EDT by a video enabled telemedicine application and verified that I am speaking with the correct person using two identifiers.  Location patient: home Location provider:work or home office Persons participating in the virtual visit: patient, provider  I discussed the limitations of evaluation and management by telemedicine and the availability of in person appointments. The patient expressed understanding and agreed to proceed.   HPI: 38 year old female who is being evaluated today for one month follow-up regarding obesity.  Since her fourth month on phentermine Last month we increased phentermine from 30 mg to 37.5 mg.   Today she reports that she is eating much healthier over the last month and drinking a lot of water.  Continues to exercise at home but over the last month she has been having to work late and has not had the time that she would like to devote to exercise.  Additionally she also reports a very heavy.  This month and continues to feel bloated from this.  She weighed herself at home this morning and her weight was 313, this is up 2 pounds from last month.  Does report that prior to her.  She was able to get down to 308.  Wt Readings from Last 3 Encounters:  07/14/18 (!) 311 lb 3.2 oz (141.2 kg)  06/09/18 (!) 316 lb 12.8 oz (143.7 kg)  05/09/18 (!) 321 lb (145.6 kg)    ROS: See pertinent positives and negatives per HPI.  Past Medical History:  Diagnosis Date  . Arthritis    L5 Back - no meds - otc prn  . Asthma   . Crohn's disease of ileum ([place])   . Hypertension    no meds x 3 yrs  . IBS (irritable bowel syndrome)   . Morbid obesity with BMI of 45.0-49.9, adult ([place])   . Sacral fracture [facility])     Past Surgical History:  Procedure Laterality Date  . COLONOSCOPY    . DILATION AND EVACUATION  02/25/2012   Procedure: DILATATION AND EVACUATION;  Surgeon: Daria Pastures, MD;   Location: [place] [facility];  Service: Gynecology;  Laterality: N/A;  . DILATION AND EVACUATION N/A 03/01/2013   Procedure: DILATATION AND EVACUATION;  Surgeon: Olga Millers, MD;  Location: [place] [facility];  Service: Gynecology;  Laterality: N/A;  . TONSILLECTOMY AND ADENOIDECTOMY  2007    Family History  Problem Relation Age of Onset  . COPD Maternal Grandmother   . Dementia Maternal Grandmother   . Cancer Paternal Grandmother        blood cancer?  . Stroke Paternal Grandmother   . Alzheimer's disease Maternal Grandfather       Current Outpatient Medications:  .  albuterol (VENTOLIN HFA) 108 (90 Base) MCG/ACT inhaler, Inhale 2 puffs into the lungs every 4 (four) hours as needed for wheezing., Disp: 1 Inhaler, Rfl: 3 .  cetirizine (ZYRTEC) 10 MG tablet, Take 10 mg by mouth daily. , Disp: , Rfl:  .  diphenhydrAMINE (BENADRYL) 25 MG tablet, Take 25 mg by mouth as needed for allergies. , Disp: , Rfl:  .  fluconazole (DIFLUCAN) 150 MG tablet, Diflucan 150 mg tablet  Take 1 tablet every 72 hours by oral route as directed., Disp: , Rfl:  .  fluticasone (FLONASE) 50 MCG/ACT nasal spray, Place 2 sprays into both nostrils daily., Disp: 16 g, Rfl: 6 .  phentermine 37.5 MG capsule, Take 1 capsule (37.5 mg total) by  mouth every morning., Disp: 30 capsule, Rfl: 0 .  predniSONE (DELTASONE) 20 MG tablet, Take 1 tablet (20 mg total) by mouth daily with breakfast., Disp: 10 tablet, Rfl: 0  EXAM:  VITALS per patient if applicable:  GENERAL: alert, oriented, appears well and in no acute distress  HEENT: atraumatic, conjunttiva clear, no obvious abnormalities on inspection of external nose and ears  NECK: normal movements of the head and neck  LUNGS: on inspection no signs of respiratory distress, breathing rate appears normal, no obvious gross SOB, gasping or wheezing  CV: no obvious cyanosis  MS: moves all visible extremities without noticeable abnormality  PSYCH/NEURO: pleasant and cooperative, no obvious  depression or anxiety, speech and thought processing grossly intact  ASSESSMENT AND PLAN:  Discussed the following assessment and plan:  Continue on phentermine 37.5 mg for another month.  Encouraged lifestyle modifications.  Low up in 30 days or sooner if needed  BMI 45.0-49.9, adult ([place]) - Plan: phentermine 37.5 MG capsule  Other obesity - Plan: phentermine 37.5 MG capsule     I discussed the assessment and treatment plan with the patient. The patient was provided an opportunity to ask questions and all were answered. The patient agreed with the plan and demonstrated an understanding of the instructions.   The patient was advised to call back or seek an in-person evaluation if the symptoms worsen or if the condition fails to improve as anticipated.   [name], NP

## 2018-08-22 ENCOUNTER — Ambulatory Visit: Payer: 59 | Admitting: Podiatry

## 2018-08-22 ENCOUNTER — Other Ambulatory Visit: Payer: Self-pay

## 2018-08-22 VITALS — Temp 97.7°F

## 2018-08-22 DIAGNOSIS — M722 Plantar fascial fibromatosis: Secondary | ICD-10-CM | POA: Diagnosis not present

## 2018-08-22 NOTE — Patient Instructions (Signed)

## 2018-08-28 NOTE — Progress Notes (Signed)
Subjective: 38 year old female presents the office today for follow-up evaluation of heel pain, plantar fasciitis.  She is currently not experiencing any pain symptoms have pretty much resolved.  She is injections very helpful.  She has no new concerns.  No recent injury or fall since I last saw her. Denies any systemic complaints such as fevers, chills, nausea, vomiting. No acute changes since last appointment, and no other complaints at this time.   Objective: AAO x3, NAD DP/PT pulses palpable bilaterally, CRT less than 3 seconds Very minimal discomfort to palpation on the plantar medial tubercle of the calcaneus at insertion of plantar fascia of the right foot.  Patient intact.  No, question insertion of Achilles tendon.  No pain upon compression of calcaneus.  No other areas of tenderness are identified. No open lesions or pre-ulcerative lesions.  No pain with calf compression, swelling, warmth, erythema  Assessment: Resolving right heel pain, fasciitis  Plan: -All treatment options discussed with the patient including all alternatives, risks, complications.  -Overall she is doing much better.  We will hold off on another steroid injection since her symptoms are pretty much resolved.  She will continue with stretching, icing daily.  We discussed shoe modifications and orthotics to help prevent reoccurrence long-term. -Patient encouraged to call the office with any questions, concerns, change in symptoms.   Trula Slade DPM

## 2019-04-16 ENCOUNTER — Telehealth: Payer: Self-pay | Admitting: *Deleted

## 2019-04-16 ENCOUNTER — Telehealth (INDEPENDENT_AMBULATORY_CARE_PROVIDER_SITE_OTHER): Payer: 59 | Admitting: Family Medicine

## 2019-04-16 DIAGNOSIS — M549 Dorsalgia, unspecified: Secondary | ICD-10-CM | POA: Diagnosis not present

## 2019-04-16 DIAGNOSIS — G44209 Tension-type headache, unspecified, not intractable: Secondary | ICD-10-CM | POA: Diagnosis not present

## 2019-04-16 DIAGNOSIS — G8929 Other chronic pain: Secondary | ICD-10-CM

## 2019-04-16 DIAGNOSIS — M542 Cervicalgia: Secondary | ICD-10-CM | POA: Diagnosis not present

## 2019-04-16 MED ORDER — CYCLOBENZAPRINE HCL 5 MG PO TABS: 5.0000 mg | ORAL_TABLET | Freq: Three times a day (TID) | ORAL | 0 refills | Status: DC | PRN

## 2019-04-16 MED ORDER — CELECOXIB 100 MG PO CAPS: 100.0000 mg | ORAL_CAPSULE | Freq: Two times a day (BID) | ORAL | 0 refills | Status: DC

## 2019-04-16 NOTE — Telephone Encounter (Signed)
Copied from [place] [id number]. Topic: General - Other >> Apr 16, 2019  9:04 AM Celene Kras wrote: Reason for CRM: Pt called and is requesting to have an appt scheduled for today for a headache and back ache. Please advise.  Appointment scheduled. Patient also reports she is having some nasal drainage and allergy sx's. Explained to patient that d/t those sx she will need a virtual visit. Patient verbalized understanding. Appointment scheduled for 10AM with Dr. Volanda Napoleon

## 2019-04-16 NOTE — Progress Notes (Signed)
Virtual Visit via Video Note  I connected with Sydney Bradley on 04/16/19 at 10:00 AM EST by a video enabled telemedicine application 2/2 TIWPY-09 pandemic and verified that I am speaking with the correct person using two identifiers.  Location patient: home Location provider:work or home office Persons participating in the virtual visit: patient, provider  I discussed the limitations of evaluation and management by telemedicine and the availability of in person appointments. The patient expressed understanding and agreed to proceed.   HPI: Pt is a 39 yo female with pmh sig for crohn's dz, arthritis, h/o sacral fx, GERD, obesity, HTN followed by [name], NP.  Seen for acute concern.  Pt with HA x 4 days and a back ache, muscle cramps in feet.  Pt notes h/o chronic back pain s/p a fall in which she suffered a fx of sacrum and spine.  Feels like her whole spine is aching especially in posterior neck and head causing the HA.  Pt endorses stiff back, pain around head and behind eyes.  Pt tired ibuprofen, ice packs, tylenol.  In the past celebrex and flexeril helped.  Dilaudid made her feel like she was "seeing lucky charms".  Pt requesting note for work.  ROS: See pertinent positives and negatives per HPI.  Past Medical History:  Diagnosis Date  . Arthritis    L5 Back - no meds - otc prn  . Asthma   . Crohn's disease of ileum ([place])   . Hypertension    no meds x 3 yrs  . IBS (irritable bowel syndrome)   . Morbid obesity with BMI of 45.0-49.9, adult ([place])   . Sacral fracture [facility])     Past Surgical History:  Procedure Laterality Date  . COLONOSCOPY    . DILATION AND EVACUATION  02/25/2012   Procedure: DILATATION AND EVACUATION;  Surgeon: Daria Pastures, MD;  Location: [place] [facility];  Service: Gynecology;  Laterality: N/A;  . DILATION AND EVACUATION N/A 03/01/2013   Procedure: DILATATION AND EVACUATION;  Surgeon: Olga Millers, MD;  Location: [place] [facility];  Service: Gynecology;  Laterality:  N/A;  . TONSILLECTOMY AND ADENOIDECTOMY  2007    Family History  Problem Relation Age of Onset  . COPD Maternal Grandmother   . Dementia Maternal Grandmother   . Cancer Paternal Grandmother        blood cancer?  . Stroke Paternal Grandmother   . Alzheimer's disease Maternal Grandfather       Current Outpatient Medications:  .  albuterol (VENTOLIN HFA) 108 (90 Base) MCG/ACT inhaler, Inhale 2 puffs into the lungs every 4 (four) hours as needed for wheezing., Disp: 1 Inhaler, Rfl: 3 .  cetirizine (ZYRTEC) 10 MG tablet, Take 10 mg by mouth daily. , Disp: , Rfl:  .  clotrimazole-betamethasone (LOTRISONE) cream, clotrimazole-betamethasone 1 %-0.05 % topical cream  APPLY TO THE AFFECTED AND SURROUNDING AREAS OF SKIN BY TOPICAL ROUTE QID prn, Disp: , Rfl:  .  diphenhydrAMINE (BENADRYL) 25 MG tablet, Take 25 mg by mouth as needed for allergies. , Disp: , Rfl:  .  fluconazole (DIFLUCAN) 150 MG tablet, Diflucan 150 mg tablet  Take 1 tablet every 72 hours by oral route as directed., Disp: , Rfl:  .  fluticasone (FLONASE) 50 MCG/ACT nasal spray, Place 2 sprays into both nostrils daily., Disp: 16 g, Rfl: 6 .  phentermine 37.5 MG capsule, Take 1 capsule (37.5 mg total) by mouth every morning., Disp: 30 capsule, Rfl: 0 .  predniSONE (DELTASONE) 20 MG tablet, Take  1 tablet (20 mg total) by mouth daily with breakfast., Disp: 10 tablet, Rfl: 0  EXAM:  VITALS per patient if applicable:  RR between   GENERAL: alert, oriented, appears well and in no acute distress  HEENT: atraumatic, conjunctiva clear, no obvious abnormalities on inspection of external nose and ears  NECK: normal movements of the head and neck  LUNGS: on inspection no signs of respiratory distress, breathing rate appears normal, no obvious gross SOB, gasping or wheezing  CV: no obvious cyanosis  MS: moves all visible extremities without noticeable abnormality  PSYCH/NEURO: pleasant and cooperative, no obvious depression or  anxiety, speech and thought processing grossly intact  ASSESSMENT AND PLAN:  Discussed the following assessment and plan:  Chronic neck and back pain  -continue supportive care: massage, stretching, heat/ice, rest - Plan: cyclobenzaprine (FLEXERIL) 5 MG tablet, celecoxib (CELEBREX) 100 MG capsule  Acute non intractable tension-type headache -likely tension from arthritis pain in spine. -continue supportive care. -relief of back pain likely to resolve HA. -given precautions -given note for work  F/u with pcp prn  I discussed the assessment and treatment plan with the patient. The patient was provided an opportunity to ask questions and all were answered. The patient agreed with the plan and demonstrated an understanding of the instructions.   The patient was advised to call back or seek an in-person evaluation if the symptoms worsen or if the condition fails to improve as anticipated.   Billie Ruddy, MD

## 2019-04-30 ENCOUNTER — Encounter: Payer: Self-pay | Admitting: Adult Health

## 2019-05-02 ENCOUNTER — Other Ambulatory Visit: Payer: Self-pay

## 2019-05-02 ENCOUNTER — Ambulatory Visit: Payer: Self-pay | Admitting: *Deleted

## 2019-05-02 ENCOUNTER — Telehealth (INDEPENDENT_AMBULATORY_CARE_PROVIDER_SITE_OTHER): Payer: 59 | Admitting: Family Medicine

## 2019-05-02 DIAGNOSIS — J4531 Mild persistent asthma with (acute) exacerbation: Secondary | ICD-10-CM

## 2019-05-02 DIAGNOSIS — J069 Acute upper respiratory infection, unspecified: Secondary | ICD-10-CM

## 2019-05-02 DIAGNOSIS — Z209 Contact with and (suspected) exposure to unspecified communicable disease: Secondary | ICD-10-CM

## 2019-05-02 MED ORDER — METHYLPREDNISOLONE 4 MG PO TBPK: ORAL_TABLET | ORAL | 0 refills | Status: DC

## 2019-05-02 MED ORDER — ALBUTEROL SULFATE (2.5 MG/3ML) 0.083% IN NEBU: 2.5000 mg | INHALATION_SOLUTION | RESPIRATORY_TRACT | 0 refills | Status: DC | PRN

## 2019-05-02 NOTE — Telephone Encounter (Signed)
Pt stated she saw Dr.Fry today and was given medication. No further action needed.

## 2019-05-02 NOTE — Telephone Encounter (Signed)
FYI

## 2019-05-02 NOTE — Progress Notes (Signed)
Virtual Visit via Video Note  I connected with the patient on 05/02/19 at  3:30 PM EST by a video enabled telemedicine application and verified that I am speaking with the correct person using two identifiers.  Location patient: home Location provider:work or home office Persons participating in the virtual visit: patient, provider  I discussed the limitations of evaluation and management by telemedicine and the availability of in person appointments. The patient expressed understanding and agreed to proceed.   HPI: Here for the onset yesterday of chest tightness, SOB, coughing up clear sputum, stuffy head, and PND. No fever or ST or headache or loss of taste or smell. No body aches or NVD. Using Zyrtec and her rescue inhaler. Of note a friend, who spent several hours with her in her home last weekend, tested positive for the Covid-19 virus 2 days ago. Sydney Bradley herself was tested yesterday but the results are still pending.    ROS: See pertinent positives and negatives per HPI.  Past Medical History:  Diagnosis Date  . Arthritis    L5 Back - no meds - otc prn  . Asthma   . Crohn's disease of ileum ([place])   . Hypertension    no meds x 3 yrs  . IBS (irritable bowel syndrome)   . Morbid obesity with BMI of 45.0-49.9, adult ([place])   . Sacral fracture [facility])     Past Surgical History:  Procedure Laterality Date  . COLONOSCOPY    . DILATION AND EVACUATION  02/25/2012   Procedure: DILATATION AND EVACUATION;  Surgeon: Daria Pastures, MD;  Location: [facility];  Service: Gynecology;  Laterality: N/A;  . DILATION AND EVACUATION N/A 03/01/2013   Procedure: DILATATION AND EVACUATION;  Surgeon: Olga Millers, MD;  Location: [facility];  Service: Gynecology;  Laterality: N/A;  . TONSILLECTOMY AND ADENOIDECTOMY  2007    Family History  Problem Relation Age of Onset  . COPD Maternal Grandmother   . Dementia Maternal Grandmother   . Cancer Paternal Grandmother        blood cancer?  . Stroke  Paternal Grandmother   . Alzheimer's disease Maternal Grandfather      Current Outpatient Medications:  .  albuterol (PROVENTIL) (2.5 MG/3ML) 0.083% nebulizer solution, Take 3 mLs (2.5 mg total) by nebulization every 4 (four) hours as needed for wheezing or shortness of breath., Disp: 75 mL, Rfl: 0 .  albuterol (VENTOLIN HFA) 108 (90 Base) MCG/ACT inhaler, Inhale 2 puffs into the lungs every 4 (four) hours as needed for wheezing., Disp: 1 Inhaler, Rfl: 3 .  celecoxib (CELEBREX) 100 MG capsule, Take 1 capsule (100 mg total) by mouth 2 (two) times daily., Disp: 20 capsule, Rfl: 0 .  cetirizine (ZYRTEC) 10 MG tablet, Take 10 mg by mouth daily. , Disp: , Rfl:  .  clotrimazole-betamethasone (LOTRISONE) cream, clotrimazole-betamethasone 1 %-0.05 % topical cream  APPLY TO THE AFFECTED AND SURROUNDING AREAS OF SKIN BY TOPICAL ROUTE QID prn, Disp: , Rfl:  .  cyclobenzaprine (FLEXERIL) 5 MG tablet, Take 1 tablet (5 mg total) by mouth 3 (three) times daily as needed for muscle spasms., Disp: 30 tablet, Rfl: 0 .  diphenhydrAMINE (BENADRYL) 25 MG tablet, Take 25 mg by mouth as needed for allergies. , Disp: , Rfl:  .  fluconazole (DIFLUCAN) 150 MG tablet, Diflucan 150 mg tablet  Take 1 tablet every 72 hours by oral route as directed., Disp: , Rfl:  .  fluticasone (FLONASE) 50 MCG/ACT nasal spray, Place 2 sprays into  both nostrils daily., Disp: 16 g, Rfl: 6 .  methylPREDNISolone (MEDROL DOSEPAK) 4 MG TBPK tablet, As directed, Disp: 21 tablet, Rfl: 0 .  phentermine 37.5 MG capsule, Take 1 capsule (37.5 mg total) by mouth every morning., Disp: 30 capsule, Rfl: 0 .  predniSONE (DELTASONE) 20 MG tablet, Take 1 tablet (20 mg total) by mouth daily with breakfast., Disp: 10 tablet, Rfl: 0  EXAM:  VITALS per patient if applicable:  GENERAL: alert, oriented, appears well and in no acute distress  HEENT: atraumatic, conjunttiva clear, no obvious abnormalities on inspection of external nose and ears  NECK: normal  movements of the head and neck  LUNGS: on inspection no signs of respiratory distress, breathing rate appears normal, no obvious gross SOB, gasping or wheezing  CV: no obvious cyanosis  MS: moves all visible extremities without noticeable abnormality  PSYCH/NEURO: pleasant and cooperative, no obvious depression or anxiety, speech and thought processing grossly intact  ASSESSMENT AND PLAN: She seems to have a viral URI and this is causing an asthma flare up. We will treat this with a Medrol dose pack, and we will send in albuterol vials that she can use in her son's nebulizer if she needs them. She will quarantine at home at least until her Covid result is back .  Alysia Penna, MD  Discussed the following assessment and plan:  No diagnosis found.     I discussed the assessment and treatment plan with the patient. The patient was provided an opportunity to ask questions and all were answered. The patient agreed with the plan and demonstrated an understanding of the instructions.   The patient was advised to call back or seek an in-person evaluation if the symptoms worsen or if the condition fails to improve as anticipated.

## 2019-05-02 NOTE — Telephone Encounter (Signed)
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 Patient was tested and is awaiting results. Patient has cough and is having to use her emergency inhaler more than normal and not getting results- call to office for appointment.  Reason for Disposition . MILD difficulty breathing (e.g., minimal/no SOB at rest, SOB with walking, pulse <100)  Answer Assessment - Initial Assessment Questions 1. COVID-19 DIAGNOSIS: "Who made your Coronavirus (COVID-19) diagnosis?" "Was it confirmed by a positive lab test?" If not diagnosed by a HCP, ask "Are there lots of cases (community spread) where you live?" (See public health department website, if unsure)     Patient is awaiting test results- exposure/symptoms 2. COVID-19 EXPOSURE: "Was there any known exposure to COVID before the symptoms began?" CDC Definition of close contact: within 6 feet (2 meters) for a total of 15 minutes or more over a 24-hour period.      Yes- 1/16 3. ONSET: "When did the COVID-19 symptoms start?"      Symptoms started over the weekend- 42-72 hours 4. WORST SYMPTOM: "What is your worst symptom?" (e.g., cough, fever, shortness of breath, muscle aches)     SOB- using inhaler 5. COUGH: "Do you have a cough?" If so, ask: "How bad is the cough?"       Productive cough 6. FEVER: "Do you have a fever?" If so, ask: "What is your temperature, how was it measured, and when did it start?"     no 7. RESPIRATORY STATUS: "Describe your breathing?" (e.g., shortness of breath, wheezing, unable to speak)      Feels like she has been running- inhaler not helping 8. BETTER-SAME-WORSE: "Are you getting better, staying the same or getting worse compared to yesterday?"  If getting worse, ask, "In what way?"     Worse- increasing symptoms 9. HIGH RISK DISEASE: "Do you have any chronic medical problems?" (e.g., asthma, heart or lung disease, weak immune system, obesity, etc.)     asthma 10. PREGNANCY: "Is there any  chance you are pregnant?" "When was your last menstrual period?"       n/a 11. OTHER SYMPTOMS: "Do you have any other symptoms?"  (e.g., chills, fatigue, headache, loss of smell or taste, muscle pain, sore throat; new loss of smell or taste especially support the diagnosis of COVID-19)      Headache, runny nose, neck and shoulder pain  Protocols used: CORONAVIRUS (COVID-19) DIAGNOSED OR SUSPECTED-A-AH

## 2019-05-03 ENCOUNTER — Telehealth: Payer: 59 | Admitting: Adult Health

## 2019-05-04 ENCOUNTER — Encounter: Payer: Self-pay | Admitting: Family Medicine

## 2019-05-06 ENCOUNTER — Encounter: Payer: Self-pay | Admitting: Family Medicine

## 2019-05-07 NOTE — Telephone Encounter (Signed)
Tell her that it takes about 2 weeks for any immunization to be effective in the body, so since she has been exposed to a positive Covid case, she should quarantine at home for 10 days beginning today. We can provide a note for her job if needed

## 2019-05-09 NOTE — Telephone Encounter (Signed)
Yes please get her a letter to cover these 10 days out

## 2019-05-13 ENCOUNTER — Encounter: Payer: Self-pay | Admitting: Adult Health

## 2019-05-15 NOTE — Telephone Encounter (Signed)
Pt notified of update. No further action needed.

## 2019-05-15 NOTE — Telephone Encounter (Signed)
Okay to use a Virtual spot tomorrow at 230 for pt?

## 2019-07-10 ENCOUNTER — Ambulatory Visit: Payer: 59 | Admitting: Adult Health

## 2019-07-17 ENCOUNTER — Ambulatory Visit: Payer: 59 | Admitting: Adult Health

## 2019-07-17 ENCOUNTER — Other Ambulatory Visit: Payer: Self-pay

## 2019-07-17 ENCOUNTER — Encounter: Payer: Self-pay | Admitting: Adult Health

## 2019-07-17 VITALS — BP 118/90 | Temp 98.4°F | Wt 328.0 lb

## 2019-07-17 DIAGNOSIS — E668 Other obesity: Secondary | ICD-10-CM

## 2019-07-17 DIAGNOSIS — M549 Dorsalgia, unspecified: Secondary | ICD-10-CM | POA: Diagnosis not present

## 2019-07-17 DIAGNOSIS — G8929 Other chronic pain: Secondary | ICD-10-CM

## 2019-07-17 DIAGNOSIS — M542 Cervicalgia: Secondary | ICD-10-CM

## 2019-07-17 MED ORDER — PHENTERMINE HCL 30 MG PO CAPS: 30.0000 mg | ORAL_CAPSULE | ORAL | 0 refills | Status: DC

## 2019-07-17 MED ORDER — CYCLOBENZAPRINE HCL 5 MG PO TABS: 5.0000 mg | ORAL_TABLET | Freq: Three times a day (TID) | ORAL | 0 refills | Status: DC | PRN

## 2019-07-17 MED ORDER — CELECOXIB 100 MG PO CAPS: 100.0000 mg | ORAL_CAPSULE | Freq: Two times a day (BID) | ORAL | 0 refills | Status: DC

## 2019-07-17 NOTE — Progress Notes (Signed)
Subjective:    Patient ID: Sydney Bradley, female    DOB: [birth year], 39 y.o.   MRN: [id number]  HPI 39 year old female who  has a past medical history of Arthritis, Asthma, Crohn's disease of ileum ([place]), Hypertension, IBS (irritable bowel syndrome), Morbid obesity with BMI of 45.0-49.9, adult ([place]), and Sacral fracture ([place]). She presents to the office today for follow-up regarding obesity management.  I last saw her for this in May 2020.  In August 2020 she started going to see someone at [facility] in [facility].  She stopped going for multiple reasons including cost, the program was not working for her she was not losing weight, and she did not like the way she was treated at this office.  They had placed her on Qsymia 7.5-46mg and did she did not find this medication helpful in losing weight and it cost her over $100 a month to get filled.  She returns today, her weight is up.  She is doing a lot of stress/emotional eating due to stresses at work, and not losing weight.  She does exercise about 2 days a week but due to long work hours she is unable to exercise any more than that.    Wt Readings from Last 3 Encounters:  07/17/19 (!) 328 lb (148.8 kg)  07/14/18 (!) 311 lb 3.2 oz (141.2 kg)  06/09/18 (!) 316 lb 12.8 oz (143.7 kg)   With the increase in weight she has started noticing worsening pain in her back and neck.  At one point in time she was prescribed Cymbalta and Flexeril for this and she took as needed.  She is wondering if she can get another refill this medication until she can get some of her extra weight off.   Review of Systems See HPI   Past Medical History:  Diagnosis Date  . Arthritis    L5 Back - no meds - otc prn  . Asthma   . Crohn's disease of ileum ([place])   . Hypertension    no meds x 3 yrs  . IBS (irritable bowel syndrome)   . Morbid obesity with BMI of 45.0-49.9, adult ([place])   . Sacral fracture [facility])     Social History     Socioeconomic History  . Marital status: Married    Spouse name: Not on file  . Number of children: 1  . Years of education: 26  . Highest education level: Not on file  Occupational History  . 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 Smoking status: Never Smoker  . Smokeless tobacco: Never Used  Substance and Sexual Activity  . Alcohol use: No    Alcohol/week: 0.0 standard drinks  . Drug use: No  . Sexual activity: Yes    Birth control/protection: None  Other Topics Concern  . 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 Difficulty of Paying Living Expenses:   Food Insecurity:   . Worried About Charity fundraiser in the Last Year:   . Arboriculturist in the Last Year:   Transportation Needs:   . Film/video editor (Medical):   Marland Kitchen  Lack of Transportation (Non-Medical):   Physical Activity:   . Days of Exercise per Week:   . Minutes of Exercise per Session:   Stress:   . Feeling of Stress :   Social Connections:   . Frequency of Communication with Friends and Family:   . Frequency of Social Gatherings with Friends and Family:   . Attends Religious Services:   . Active Member of Clubs or Organizations:   . Attends Archivist Meetings:   Marland Kitchen Marital Status:   Intimate Partner Violence:   . Fear of Current or Ex-Partner:   . Emotionally Abused:   Marland Kitchen Physically Abused:   . Sexually Abused:     Past Surgical History:  Procedure Laterality Date  . COLONOSCOPY    . DILATION AND EVACUATION  02/25/2012   Procedure: DILATATION AND EVACUATION;  Surgeon: Daria Pastures, MD;  Location: [facility];  Service: Gynecology;  Laterality: N/A;  . DILATION AND EVACUATION N/A 03/01/2013    Procedure: DILATATION AND EVACUATION;  Surgeon: Olga Millers, MD;  Location: [facility];  Service: Gynecology;  Laterality: N/A;  . TONSILLECTOMY AND ADENOIDECTOMY  2007    Family History  Problem Relation Age of Onset  . COPD Maternal Grandmother   . Dementia Maternal Grandmother   . Cancer Paternal Grandmother        blood cancer?  . Stroke Paternal Grandmother   . Alzheimer's disease Maternal Grandfather     Allergies  Allergen Reactions  . Latex Hives    Breathing problems  . 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 Shellfish Allergy Anaphylaxis  . Penicillins Hives    Has patient had a PCN reaction causing immediate rash, facial/tongue/throat swelling, SOB or lightheadedness with hypotension: Yes Has patient had a PCN reaction causing severe rash involving mucus membranes or skin necrosis: No Has patient had a PCN reaction that required hospitalization No Has patient had a PCN reaction occurring within the last 10 years: No If all of the above answers are "NO", then may proceed with Cephalosporin use.   . Chocolate Itching  . Ciprofloxacin Hives  . Lamisil [Terbinafine] Hives  . Wheat Bran Itching    Current Outpatient Medications on File Prior to Visit  Medication Sig Dispense Refill  . albuterol (PROVENTIL) (2.5 MG/3ML) 0.083% nebulizer solution Take 3 mLs (2.5 mg total) by nebulization every 4 (four) hours as needed for wheezing or shortness of breath. 75 mL 0  . albuterol (VENTOLIN HFA) 108 (90 Base) MCG/ACT inhaler Inhale 2 puffs into the lungs every 4 (four) hours as needed for wheezing. 1 Inhaler 3  . cetirizine (ZYRTEC) 10 MG tablet Take 10 mg by mouth daily.     . clotrimazole-betamethasone (LOTRISONE) cream clotrimazole-betamethasone 1 %-0.05 % topical cream  APPLY TO THE AFFECTED AND SURROUNDING AREAS OF SKIN BY TOPICAL ROUTE QID prn    . diphenhydrAMINE (BENADRYL) 25 MG tablet Take 25 mg by mouth as needed for allergies.     .  fluticasone (FLONASE) 50 MCG/ACT nasal spray Place 2 sprays into both nostrils daily. 16 g 6  . Norethin Ace-Eth Estrad-FE (LOESTRIN FE 1/20 PO) Take by mouth.     No current facility-administered medications on file prior to visit.    BP 118/90   Temp 98.4 F (36.9 C)   Wt (!) 328 lb (148.8 kg)   BMI 48.44 kg/m       Objective:   Physical Exam Vitals and nursing note  reviewed.  Constitutional:      Appearance: Normal appearance. She is obese.  Cardiovascular:     Rate and Rhythm: Normal rate and regular rhythm.     Pulses: Normal pulses.     Heart sounds: Normal heart sounds.  Pulmonary:     Effort: Pulmonary effort is normal.     Breath sounds: Normal breath sounds.  Musculoskeletal:        General: Normal range of motion.  Skin:    General: Skin is warm and dry.  Neurological:     General: No focal deficit present.     Mental Status: She is alert and oriented to person, place, and time.  Psychiatric:        Mood and Affect: Mood normal.        Behavior: Behavior normal.        Thought Content: Thought content normal.        Judgment: Judgment normal.       Assessment & Plan:  1. Other obesity -She has done very well with phentermine in the past.  We will place her back on 30 mg tablets.  She was encouraged to work on cutting back on stress/emotional eating, and she is talking to her therapist about this.  I would like her to get out more in the evenings and walk even if it is for 20 minutes at a time.  She will follow-up in 30 days or sooner if needed - phentermine 30 MG capsule; Take 1 capsule (30 mg total) by mouth every morning.  Dispense: 30 capsule; Refill: 0  2. Chronic neck and back pain  - celecoxib (CELEBREX) 100 MG capsule; Take 1 capsule (100 mg total) by mouth 2 (two) times daily.  Dispense: 20 capsule; Refill: 0 - cyclobenzaprine (FLEXERIL) 5 MG tablet; Take 1 tablet (5 mg total) by mouth 3 (three) times daily as needed for muscle spasms.  Dispense:  30 tablet; Refill: 0   [name], NP

## 2019-07-18 ENCOUNTER — Encounter: Payer: Self-pay | Admitting: Adult Health

## 2019-07-19 ENCOUNTER — Encounter: Payer: Self-pay | Admitting: Adult Health

## 2019-07-20 ENCOUNTER — Other Ambulatory Visit: Payer: Self-pay | Admitting: Adult Health

## 2019-07-20 MED ORDER — DOXYCYCLINE HYCLATE 100 MG PO CAPS: 100.0000 mg | ORAL_CAPSULE | Freq: Two times a day (BID) | ORAL | 0 refills | Status: DC

## 2019-08-13 ENCOUNTER — Other Ambulatory Visit: Payer: Self-pay

## 2019-08-14 ENCOUNTER — Encounter: Payer: Self-pay | Admitting: Adult Health

## 2019-08-14 ENCOUNTER — Ambulatory Visit (INDEPENDENT_AMBULATORY_CARE_PROVIDER_SITE_OTHER): Payer: 59 | Admitting: Adult Health

## 2019-08-14 VITALS — BP 124/80 | Temp 98.0°F | Wt 321.0 lb

## 2019-08-14 DIAGNOSIS — M542 Cervicalgia: Secondary | ICD-10-CM | POA: Diagnosis not present

## 2019-08-14 DIAGNOSIS — Z0000 Encounter for general adult medical examination without abnormal findings: Secondary | ICD-10-CM | POA: Diagnosis not present

## 2019-08-14 DIAGNOSIS — J4531 Mild persistent asthma with (acute) exacerbation: Secondary | ICD-10-CM

## 2019-08-14 DIAGNOSIS — Z6841 Body Mass Index (BMI) 40.0 and over, adult: Secondary | ICD-10-CM

## 2019-08-14 DIAGNOSIS — G8929 Other chronic pain: Secondary | ICD-10-CM

## 2019-08-14 DIAGNOSIS — M549 Dorsalgia, unspecified: Secondary | ICD-10-CM

## 2019-08-14 MED ORDER — CELECOXIB 100 MG PO CAPS: 100.0000 mg | ORAL_CAPSULE | Freq: Two times a day (BID) | ORAL | 0 refills | Status: DC

## 2019-08-14 MED ORDER — PHENTERMINE HCL 30 MG PO CAPS: 30.0000 mg | ORAL_CAPSULE | ORAL | 0 refills | Status: DC

## 2019-08-14 NOTE — Progress Notes (Signed)
Subjective:    Patient ID: Sydney Bradley, female    DOB: 1980-07-27, 39 y.o.   MRN: [id number]  HPI 39 year old female who  has a past medical history of Arthritis, Asthma, Crohn's disease of ileum ([place]), Hypertension, IBS (irritable bowel syndrome), Morbid obesity with BMI of 45.0-49.9, adult ([place]), and Sacral fracture ([place]).    Patient presents for yearly preventative medicine examination. She is a pleasant 39 year old female who  has a past medical history of Arthritis, Asthma, Crohn's disease of ileum ([place]), Hypertension, IBS (irritable bowel syndrome), Morbid obesity with BMI of 45.0-49.9, adult ([place]), and Sacral fracture ([place]).  Obesity - She presents to the office today regarding obesity management. Last month she was restarted on Phentermine 30 mg. She was doing a lot of stress/emotional eating. She was also not exercising more than 2 days a week due to long work hours and was not able to exercise any more than that.  Over the last month she has been doing less stress eating and instead of snacking on junk food she has been eating fruits and vegetables.  She is also starting a garden in her yard.  She has been exercising more by daily walking and she believes she is going to join a group that does a lot of bike riding.  Is been able to lose about 7 pounds over the last month. She denies any side effects of phentermine   Wt Readings from Last 3 Encounters:  08/14/19 (!) 321 lb (145.6 kg)  07/17/19 (!) 328 lb (148.8 kg)  07/14/18 (!) 311 lb 3.2 oz (141.2 kg)   Mild Asthma -uses albuterol nebulizer as needed as well as an albuterol inhaler as needed.  She reports that her asthma has not been that bad this spring and she is not having to use her medication that often.  Osteoarthritis -she has chronic neck and back pain for which she uses Celebrex as needed.  All immunizations and health maintenance protocols were reviewed with the patient and needed orders were placed. UTD on vaccinations    Appropriate screening laboratory values were ordered for the patient including screening of hyperlipidemia, renal function and hepatic function.  Medication reconciliation,  past medical history, social history, problem list and allergies were reviewed in detail with the patient  Goals were established with regard to weight loss, exercise, and  diet in compliance with medications  She has an appointment with her GYN for her routine Pap and mammogram in June 2021    Review of Systems  Constitutional: Negative.   HENT: Negative.   Eyes: Negative.   Respiratory: Negative.   Cardiovascular: Negative.   Gastrointestinal: Negative.   Endocrine: Negative.   Genitourinary: Negative.   Musculoskeletal: Negative.   Skin: Negative.   Allergic/Immunologic: Negative.   Neurological: Negative.   Hematological: Negative.   Psychiatric/Behavioral: Negative.      Past Medical History:  Diagnosis Date  . Arthritis    L5 Back - no meds - otc prn  . Asthma   . Crohn's disease of ileum ([place])   . Hypertension    no meds x 3 yrs  . IBS (irritable bowel syndrome)   . Morbid obesity with BMI of 45.0-49.9, adult ([place])   . Sacral fracture [facility])     Social History   Socioeconomic History  . Marital status: Married    Spouse name: Not on file  . Number of children: 1  . Years of education: 67  . Highest education  level: Not on file  Occupational History  . 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 Smoking status: Never Smoker  . Smokeless tobacco: Never Used  Substance and Sexual Activity  . Alcohol use: No    Alcohol/week: 0.0 standard drinks  . Drug use: No  . Sexual activity: Yes    Birth control/protection: None  Other Topics Concern  . 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 Difficulty of Paying Living Expenses:   Food Insecurity:   . Worried About Charity fundraiser in the Last Year:   . Arboriculturist in the Last Year:   Transportation Needs:   . Film/video editor (Medical):   Marland Kitchen Lack of Transportation (Non-Medical):   Physical Activity:   . Days of Exercise per Week:   . Minutes of Exercise per Session:   Stress:   . Feeling of Stress :   Social Connections:   . Frequency of Communication with Friends and Family:   . Frequency of Social Gatherings with Friends and Family:   . Attends Religious Services:   . Active Member of Clubs or Organizations:   . Attends Archivist Meetings:   Marland Kitchen Marital Status:   Intimate Partner Violence:   . Fear of Current or Ex-Partner:   . Emotionally Abused:   Marland Kitchen Physically Abused:   . Sexually Abused:     Past Surgical History:  Procedure Laterality Date  . COLONOSCOPY    . DILATION AND EVACUATION  02/25/2012   Procedure: DILATATION AND EVACUATION;  Surgeon: Daria Pastures, MD;  Location: [facility];  Service: Gynecology;  Laterality: N/A;  . DILATION AND EVACUATION N/A 03/01/2013   Procedure: DILATATION AND EVACUATION;  Surgeon: Olga Millers, MD;  Location: [facility];  Service: Gynecology;  Laterality: N/A;  . TONSILLECTOMY AND ADENOIDECTOMY  2007    Family History  Problem Relation Age of Onset  . COPD Maternal Grandmother   . Dementia Maternal Grandmother   . Cancer Paternal Grandmother        blood cancer?  . Stroke Paternal Grandmother   . Alzheimer's disease Maternal Grandfather     Allergies  Allergen Reactions  . Latex Hives    Breathing problems  . 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 Shellfish Allergy Anaphylaxis  . Penicillins Hives    Has patient had a PCN reaction causing immediate rash, facial/tongue/throat swelling, SOB or lightheadedness with  hypotension: Yes Has patient had a PCN reaction causing severe rash involving mucus membranes or skin necrosis: No Has patient had a PCN reaction that required hospitalization No Has patient had a PCN reaction occurring within the last 10 years: No If all of the above answers are "NO", then may proceed with Cephalosporin use.   . Chocolate Itching  . Ciprofloxacin Hives  . Lamisil [Terbinafine] Hives  . Wheat Bran Itching    Current Outpatient Medications on File Prior to Visit  Medication Sig Dispense Refill  . albuterol (PROVENTIL) (2.5 MG/3ML) 0.083% nebulizer solution Take 3 mLs (2.5 mg total) by nebulization every 4 (four) hours as needed for wheezing or  shortness of breath. 75 mL 0  . albuterol (VENTOLIN HFA) 108 (90 Base) MCG/ACT inhaler Inhale 2 puffs into the lungs every 4 (four) hours as needed for wheezing. 1 Inhaler 3  . celecoxib (CELEBREX) 100 MG capsule Take 1 capsule (100 mg total) by mouth 2 (two) times daily. 20 capsule 0  . cetirizine (ZYRTEC) 10 MG tablet Take 10 mg by mouth daily.     . clotrimazole-betamethasone (LOTRISONE) cream clotrimazole-betamethasone 1 %-0.05 % topical cream  APPLY TO THE AFFECTED AND SURROUNDING AREAS OF SKIN BY TOPICAL ROUTE QID prn    . cyclobenzaprine (FLEXERIL) 5 MG tablet Take 1 tablet (5 mg total) by mouth 3 (three) times daily as needed for muscle spasms. 30 tablet 0  . diphenhydrAMINE (BENADRYL) 25 MG tablet Take 25 mg by mouth as needed for allergies.     Marland Kitchen doxycycline (VIBRAMYCIN) 100 MG capsule Take 1 capsule (100 mg total) by mouth 2 (two) times daily. 14 capsule 0  . fluticasone (FLONASE) 50 MCG/ACT nasal spray Place 2 sprays into both nostrils daily. 16 g 6  . Norethin Ace-Eth Estrad-FE (LOESTRIN FE 1/20 PO) Take by mouth.    . phentermine 30 MG capsule Take 1 capsule (30 mg total) by mouth every morning. 30 capsule 0   No current facility-administered medications on file prior to visit.    BP 124/80   Temp 98 F (36.7 C)  (Oral)   Wt (!) 321 lb (145.6 kg)   BMI 47.40 kg/m       Objective:   Physical Exam Vitals and nursing note reviewed.  Constitutional:      General: She is not in acute distress.    Appearance: Normal appearance. She is well-developed. She is obese. She is not ill-appearing.  HENT:     Head: Normocephalic and atraumatic.     Right Ear: Tympanic membrane, ear canal and external ear normal. There is no impacted cerumen.     Left Ear: Tympanic membrane, ear canal and external ear normal. There is no impacted cerumen.     Nose: Nose normal. No congestion or rhinorrhea.     Mouth/Throat:     Mouth: Mucous membranes are moist.     Pharynx: Oropharynx is clear. No oropharyngeal exudate or posterior oropharyngeal erythema.  Eyes:     General:        Right eye: No discharge.        Left eye: No discharge.     Extraocular Movements: Extraocular movements intact.     Conjunctiva/sclera: Conjunctivae normal.     Pupils: Pupils are equal, round, and reactive to light.  Neck:     Thyroid: No thyromegaly.     Vascular: No carotid bruit.     Trachea: No tracheal deviation.  Cardiovascular:     Rate and Rhythm: Normal rate and regular rhythm.     Pulses: Normal pulses.     Heart sounds: Normal heart sounds. No murmur. No friction rub. No gallop.   Pulmonary:     Effort: Pulmonary effort is normal. No respiratory distress.     Breath sounds: Normal breath sounds. No stridor. No wheezing, rhonchi or rales.  Chest:     Chest wall: No tenderness.  Abdominal:     General: Abdomen is flat. Bowel sounds are normal. There is no distension.     Palpations: Abdomen is soft. There is no mass.     Tenderness: There is no abdominal tenderness. There is no right CVA tenderness, left CVA tenderness,  guarding or rebound.     Hernia: No hernia is present.  Musculoskeletal:        General: No swelling, tenderness, deformity or signs of injury. Normal range of motion.     Cervical back: Normal range of  motion and neck supple.     Right lower leg: No edema.     Left lower leg: No edema.  Lymphadenopathy:     Cervical: No cervical adenopathy.  Skin:    General: Skin is warm and dry.     Coloration: Skin is not jaundiced or pale.     Findings: No bruising, erythema, lesion or rash.  Neurological:     General: No focal deficit present.     Mental Status: She is alert and oriented to person, place, and time.     Cranial Nerves: No cranial nerve deficit.     Sensory: No sensory deficit.     Motor: No weakness.     Coordination: Coordination normal.     Gait: Gait normal.     Deep Tendon Reflexes: Reflexes normal.  Psychiatric:        Mood and Affect: Mood normal.        Behavior: Behavior normal.        Thought Content: Thought content normal.        Judgment: Judgment normal.       Assessment & Plan:  1. Routine general medical examination at a health care facility -Continue with lifestyle modifications -Follow-up in 1 year for next CPE - CBC with Differential/Platelet - Comprehensive metabolic panel - Hemoglobin A1c - Lipid panel - TSH  2. BMI 45.0-49.9, adult ([place]) -Continue to work on diet and exercise.  We will keep her on phentermine 30 mg for another month.  Follow-up in 30 days - CBC with Differential/Platelet - Comprehensive metabolic panel - Hemoglobin A1c - Lipid panel - TSH - phentermine 30 MG capsule; Take 1 capsule (30 mg total) by mouth every morning.  Dispense: 30 capsule; Refill: 0  3. Mild persistent asthma with acute exacerbation -Continue with nebulizer and inhaler as needed - CBC with Differential/Platelet - Comprehensive metabolic panel - Hemoglobin A1c - Lipid panel - TSH  4. Chronic neck and back pain  - celecoxib (CELEBREX) 100 MG capsule; Take 1 capsule (100 mg total) by mouth 2 (two) times daily.  Dispense: 20 capsule; Refill: 0  [name], NP

## 2019-08-15 LAB — CBC WITH DIFFERENTIAL/PLATELET
Basophils Absolute: 0.1 10*3/uL (ref 0.0–0.1)
Basophils Relative: 1.1 % (ref 0.0–3.0)
Eosinophils Absolute: 0.1 10*3/uL (ref 0.0–0.7)
Eosinophils Relative: 1 % (ref 0.0–5.0)
HCT: 40.4 % (ref 36.0–46.0)
Hemoglobin: 13.1 g/dL (ref 12.0–15.0)
Lymphocytes Relative: 44.9 % (ref 12.0–46.0)
Lymphs Abs: 3.8 10*3/uL (ref 0.7–4.0)
MCHC: 32.4 g/dL (ref 30.0–36.0)
MCV: 83.3 fl (ref 78.0–100.0)
Monocytes Absolute: 0.5 10*3/uL (ref 0.1–1.0)
Monocytes Relative: 5.7 % (ref 3.0–12.0)
Neutro Abs: 4.1 10*3/uL (ref 1.4–7.7)
Neutrophils Relative %: 47.3 % (ref 43.0–77.0)
Platelets: 262 10*3/uL (ref 150.0–400.0)
RBC: 4.85 Mil/uL (ref 3.87–5.11)
RDW: 12.6 % (ref 11.5–15.5)
WBC: 8.6 10*3/uL (ref 4.0–10.5)

## 2019-08-15 LAB — COMPREHENSIVE METABOLIC PANEL
ALT: 15 U/L (ref 0–35)
AST: 13 U/L (ref 0–37)
Albumin: 4 g/dL (ref 3.5–5.2)
Alkaline Phosphatase: 47 U/L (ref 39–117)
BUN: 8 mg/dL (ref 6–23)
CO2: 26 mEq/L (ref 19–32)
Calcium: 9.3 mg/dL (ref 8.4–10.5)
Chloride: 104 mEq/L (ref 96–112)
Creatinine, Ser: 0.8 mg/dL (ref 0.40–1.20)
GFR: 96.01 mL/min (ref >60.00)
Glucose, Bld: 82 mg/dL (ref 70–99)
Potassium: 4.3 mEq/L (ref 3.5–5.1)
Sodium: 138 mEq/L (ref 135–145)
Total Bilirubin: 0.6 mg/dL (ref 0.2–1.2)
Total Protein: 7.2 g/dL (ref 6.0–8.3)

## 2019-08-15 LAB — LIPID PANEL
Cholesterol: 156 mg/dL (ref 0–200)
HDL: 49 mg/dL (ref >39.00)
LDL Cholesterol: 86 mg/dL (ref 0–99)
NonHDL: 107.25
Total CHOL/HDL Ratio: 3
Triglycerides: 104 mg/dL (ref 0.0–149.0)
VLDL: 20.8 mg/dL (ref 0.0–40.0)

## 2019-08-15 LAB — HEMOGLOBIN A1C: Hgb A1c MFr Bld: 5.6 % (ref 4.6–6.5)

## 2019-08-15 LAB — TSH: TSH: 1.34 u[IU]/mL (ref 0.35–4.50)

## 2019-08-30 ENCOUNTER — Encounter: Payer: 59 | Admitting: Adult Health

## 2019-09-05 ENCOUNTER — Encounter: Payer: 59 | Admitting: Adult Health

## 2019-09-11 ENCOUNTER — Other Ambulatory Visit: Payer: Self-pay

## 2019-09-12 ENCOUNTER — Encounter: Payer: Self-pay | Admitting: Adult Health

## 2019-09-12 ENCOUNTER — Ambulatory Visit (INDEPENDENT_AMBULATORY_CARE_PROVIDER_SITE_OTHER): Payer: 59 | Admitting: Adult Health

## 2019-09-12 VITALS — BP 118/90 | Temp 98.2°F | Ht 69.0 in | Wt 322.0 lb

## 2019-09-12 DIAGNOSIS — Z6841 Body Mass Index (BMI) 40.0 and over, adult: Secondary | ICD-10-CM | POA: Diagnosis not present

## 2019-09-12 MED ORDER — PHENTERMINE HCL 37.5 MG PO CAPS: 37.5000 mg | ORAL_CAPSULE | ORAL | 0 refills | Status: DC

## 2019-09-12 NOTE — Progress Notes (Signed)
Subjective:    Patient ID: Sydney Bradley, female    DOB: 1980-11-25, 39 y.o.   MRN: [id number]  HPI  39 year old female who  has a past medical history of Arthritis, Asthma, Crohn's disease of ileum ([place]), Hypertension, IBS (irritable bowel syndrome), Morbid obesity with BMI of 45.0-49.9, adult ([place]), and Sacral fracture ([place]).   She presents to the office today regarding weight loss management.  In April 2021 she was restarted on phentermine 30 mg.  At this time she is doing a lot of stress/emotional eating and was not exercising more than 2 days a week due to long work hours.  In May she reported that she was doing less stress eating and instead of snacking on junk food she was eating fruits and vegetables.  She was also starting to garden in her yard and has been exercising more by daily walking and is going to join a bike riding.  She was able to lose 7 pounds over the last month  She reports that over the last month she has been walking and riding bike. She went on vacation for a week and ate out a lot and she has been having issues with Crohn's  over the last week or so and has been eating a lot of starchy foods.   Wt Readings from Last 3 Encounters:  09/12/19 (!) 322 lb (146.1 kg)  08/14/19 (!) 321 lb (145.6 kg)  07/17/19 (!) 328 lb (148.8 kg)    Review of Systems See HPI   Past Medical History:  Diagnosis Date  . Arthritis    L5 Back - no meds - otc prn  . Asthma   . Crohn's disease of ileum ([place])   . Hypertension    no meds x 3 yrs  . IBS (irritable bowel syndrome)   . Morbid obesity with BMI of 45.0-49.9, adult ([place])   . Sacral fracture [facility])     Social History   Socioeconomic History  . Marital status: Married    Spouse name: Not on file  . Number of children: 1  . Years of education: 57  . Highest education level: Not on file  Occupational History  . 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 Smoking  status: Never Smoker  . Smokeless tobacco: Never Used  Substance and Sexual Activity  . Alcohol use: No    Alcohol/week: 0.0 standard drinks  . Drug use: No  . Sexual activity: Yes    Birth control/protection: None  Other Topics Concern  . 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 Difficulty of Paying Living Expenses:   Food Insecurity:   . Worried About Charity fundraiser in the Last Year:   . Arboriculturist in the Last Year:   Transportation Needs:   . Film/video editor (Medical):   Marland Kitchen Lack of Transportation (Non-Medical):   Physical Activity:   . Days of Exercise per Week:   . Minutes of Exercise per Session:   Stress:   . Feeling of Stress :   Social Connections:   . Frequency of Communication with Friends and Family:   . Frequency  of Social Gatherings with Friends and Family:   . Attends Religious Services:   . Active Member of Clubs or Organizations:   . Attends Archivist Meetings:   Marland Kitchen Marital Status:   Intimate Partner Violence:   . Fear of Current or Ex-Partner:   . Emotionally Abused:   Marland Kitchen Physically Abused:   . Sexually Abused:     Past Surgical History:  Procedure Laterality Date  . COLONOSCOPY    . DILATION AND EVACUATION  02/25/2012   Procedure: DILATATION AND EVACUATION;  Surgeon: Daria Pastures, MD;  Location: [facility];  Service: Gynecology;  Laterality: N/A;  . DILATION AND EVACUATION N/A 03/01/2013   Procedure: DILATATION AND EVACUATION;  Surgeon: Olga Millers, MD;  Location: [facility];  Service: Gynecology;  Laterality: N/A;  . TONSILLECTOMY AND ADENOIDECTOMY  2007    Family History  Problem Relation Age of Onset  . COPD Maternal Grandmother   . Dementia Maternal Grandmother   . Cancer Paternal Grandmother         blood cancer?  . Stroke Paternal Grandmother   . Alzheimer's disease Maternal Grandfather     Allergies  Allergen Reactions  . Latex Hives    Breathing problems  . 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 Shellfish Allergy Anaphylaxis  . Penicillins Hives    Has patient had a PCN reaction causing immediate rash, facial/tongue/throat swelling, SOB or lightheadedness with hypotension: Yes Has patient had a PCN reaction causing severe rash involving mucus membranes or skin necrosis: No Has patient had a PCN reaction that required hospitalization No Has patient had a PCN reaction occurring within the last 10 years: No If all of the above answers are "NO", then may proceed with Cephalosporin use.   . Chocolate Itching  . Ciprofloxacin Hives  . Lamisil [Terbinafine] Hives  . Wheat Bran Itching    Current Outpatient Medications on File Prior to Visit  Medication Sig Dispense Refill  . albuterol (PROVENTIL) (2.5 MG/3ML) 0.083% nebulizer solution Take 3 mLs (2.5 mg total) by nebulization every 4 (four) hours as needed for wheezing or shortness of breath. 75 mL 0  . albuterol (VENTOLIN HFA) 108 (90 Base) MCG/ACT inhaler Inhale 2 puffs into the lungs every 4 (four) hours as needed for wheezing. 1 Inhaler 3  . celecoxib (CELEBREX) 100 MG capsule Take 1 capsule (100 mg total) by mouth 2 (two) times daily. 20 capsule 0  . cetirizine (ZYRTEC) 10 MG tablet Take 10 mg by mouth daily.     . clotrimazole-betamethasone (LOTRISONE) cream clotrimazole-betamethasone 1 %-0.05 % topical cream  APPLY TO THE AFFECTED AND SURROUNDING AREAS OF SKIN BY TOPICAL ROUTE QID prn    . diphenhydrAMINE (BENADRYL) 25 MG tablet Take 25 mg by mouth as needed for allergies.     . fluticasone (FLONASE) 50 MCG/ACT nasal spray Place 2 sprays into both nostrils daily. 16 g 6  . Norethin Ace-Eth Estrad-FE (LOESTRIN FE 1/20 PO) Take by mouth.    . phentermine 30 MG capsule Take 1 capsule  (30 mg total) by mouth every morning. 30 capsule 0   No current facility-administered medications on file prior to visit.    BP 118/90   Temp 98.2 F (36.8 C)   Ht 5' 9"  (1.753 m)   Wt (!) 322 lb (146.1 kg)   BMI 47.55 kg/m       Objective:   Physical Exam Vitals and nursing note reviewed.  Constitutional:  Appearance: Normal appearance. She is obese.  Cardiovascular:     Rate and Rhythm: Normal rate and regular rhythm.     Pulses: Normal pulses.     Heart sounds: Normal heart sounds.  Pulmonary:     Effort: Pulmonary effort is normal.     Breath sounds: Normal breath sounds.  Skin:    General: Skin is warm and dry.     Capillary Refill: Capillary refill takes less than 2 seconds.  Neurological:     General: No focal deficit present.     Mental Status: She is alert and oriented to person, place, and time.  Psychiatric:        Mood and Affect: Mood normal.        Behavior: Behavior normal.        Thought Content: Thought content normal.        Judgment: Judgment normal.       Assessment & Plan:  1. BMI 45.0-49.9, adult ([facility]) - Will increase phentermine to 37.5 mg. She is actively working on weight loss. Needs to make better choices when she goes on vacation.  - phentermine 37.5 MG capsule; Take 1 capsule (37.5 mg total) by mouth every morning.  Dispense: 30 capsule; Refill: 0 - Follow up in 30 days or sooner if needed  [name], NP

## 2019-09-18 ENCOUNTER — Ambulatory Visit: Payer: 59 | Admitting: Adult Health

## 2019-10-12 ENCOUNTER — Other Ambulatory Visit: Payer: Self-pay

## 2019-10-12 ENCOUNTER — Ambulatory Visit: Payer: 59 | Admitting: Adult Health

## 2019-10-12 ENCOUNTER — Encounter: Payer: Self-pay | Admitting: Adult Health

## 2019-10-12 VITALS — BP 120/90 | Temp 98.2°F | Wt 315.0 lb

## 2019-10-12 DIAGNOSIS — E668 Other obesity: Secondary | ICD-10-CM | POA: Diagnosis not present

## 2019-10-12 DIAGNOSIS — Z6841 Body Mass Index (BMI) 40.0 and over, adult: Secondary | ICD-10-CM

## 2019-10-12 MED ORDER — PHENTERMINE HCL 37.5 MG PO CAPS: 37.5000 mg | ORAL_CAPSULE | ORAL | 0 refills | Status: DC

## 2019-10-12 NOTE — Progress Notes (Signed)
Subjective:    Patient ID: Sydney Bradley, female    DOB: [birth year], 39 y.o.   MRN: [id number]  HPI 39 year old female who  has a past medical history of Arthritis, Asthma, Crohn's disease of ileum ([place]), Hypertension, IBS (irritable bowel syndrome), Morbid obesity with BMI of 45.0-49.9, adult ([place]), and Sacral fracture ([place]).  She presents to the office today her for 1 month follow-up regarding weight loss management.  In April 2021 she was restarted on phentermine 30 mg.  At this time she was doing a lot of stress/emotional eating and was not exercising more than 2 days a week due to long work hours.  last month her weight was up 1 pound, she went on vacation for a week and ate out a lot and was also having issues with Crohn's over the week so she was eating a lot of starchy foods.  .Today she reports that she is walking and has joined a " walking challenge" and a " squat challenge".   She has been working on cutting back on portion size, she stops when she feels full. She is trying to work on CBT for when she is full, it is ok to throw away what she has on her plate.   Wt Readings from Last 5 Encounters:  10/12/19 (!) 315 lb (142.9 kg)  09/12/19 (!) 322 lb (146.1 kg)  08/14/19 (!) 321 lb (145.6 kg)  07/17/19 (!) 328 lb (148.8 kg)  07/14/18 (!) 311 lb 3.2 oz (141.2 kg)    Review of Systems See HPI   Past Medical History:  Diagnosis Date  . Arthritis    L5 Back - no meds - otc prn  . Asthma   . Crohn's disease of ileum ([place])   . Hypertension    no meds x 3 yrs  . IBS (irritable bowel syndrome)   . Morbid obesity with BMI of 45.0-49.9, adult ([place])   . Sacral fracture [facility])     Social History   Socioeconomic History  . Marital status: Married    Spouse name: Not on file  . Number of children: 1  . Years of education: 73  . Highest education level: Not on file  Occupational History  . 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 Smoking status: Never Smoker  . Smokeless tobacco: Never Used  Vaping Use  . Vaping Use: Never used  Substance and Sexual Activity  . Alcohol use: No    Alcohol/week: 0.0 standard drinks  . Drug use: No  . Sexual activity: Yes    Birth control/protection: None  Other Topics Concern  . 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 Difficulty of Paying Living Expenses:   Food Insecurity:   . Worried About Charity fundraiser in the Last Year:   . Arboriculturist in the Last Year:   Transportation Needs:   . Film/video editor (Medical):   Marland Kitchen Lack of Transportation (Non-Medical):   Physical Activity:   . Days of Exercise per Week:   . Minutes of Exercise per Session:   Stress:   . Feeling of Stress :  Social Connections:   . Frequency of Communication with Friends and Family:   . Frequency of Social Gatherings with Friends and Family:   . Attends Religious Services:   . Active Member of Clubs or Organizations:   . Attends Archivist Meetings:   Marland Kitchen Marital Status:   Intimate Partner Violence:   . Fear of Current or Ex-Partner:   . Emotionally Abused:   Marland Kitchen Physically Abused:   . Sexually Abused:     Past Surgical History:  Procedure Laterality Date  . COLONOSCOPY    . DILATION AND EVACUATION  02/25/2012   Procedure: DILATATION AND EVACUATION;  Surgeon: Daria Pastures, MD;  Location: [facility];  Service: Gynecology;  Laterality: N/A;  . DILATION AND EVACUATION N/A 03/01/2013   Procedure: DILATATION AND EVACUATION;  Surgeon: Olga Millers, MD;  Location: [facility];  Service: Gynecology;  Laterality: N/A;  . TONSILLECTOMY AND ADENOIDECTOMY  2007    Family History  Problem Relation Age of Onset  . COPD Maternal Grandmother     . Dementia Maternal Grandmother   . Cancer Paternal Grandmother        blood cancer?  . Stroke Paternal Grandmother   . Alzheimer's disease Maternal Grandfather     Allergies  Allergen Reactions  . Latex Hives    Breathing problems  . 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 Shellfish Allergy Anaphylaxis  . Penicillins Hives    Has patient had a PCN reaction causing immediate rash, facial/tongue/throat swelling, SOB or lightheadedness with hypotension: Yes Has patient had a PCN reaction causing severe rash involving mucus membranes or skin necrosis: No Has patient had a PCN reaction that required hospitalization No Has patient had a PCN reaction occurring within the last 10 years: No If all of the above answers are "NO", then may proceed with Cephalosporin use.   . Chocolate Itching  . Ciprofloxacin Hives  . Lamisil [Terbinafine] Hives  . Wheat Bran Itching    Current Outpatient Medications on File Prior to Visit  Medication Sig Dispense Refill  . albuterol (PROVENTIL) (2.5 MG/3ML) 0.083% nebulizer solution Take 3 mLs (2.5 mg total) by nebulization every 4 (four) hours as needed for wheezing or shortness of breath. 75 mL 0  . albuterol (VENTOLIN HFA) 108 (90 Base) MCG/ACT inhaler Inhale 2 puffs into the lungs every 4 (four) hours as needed for wheezing. 1 Inhaler 3  . celecoxib (CELEBREX) 100 MG capsule Take 1 capsule (100 mg total) by mouth 2 (two) times daily. 20 capsule 0  . cetirizine (ZYRTEC) 10 MG tablet Take 10 mg by mouth daily.     . clotrimazole-betamethasone (LOTRISONE) cream clotrimazole-betamethasone 1 %-0.05 % topical cream  APPLY TO THE AFFECTED AND SURROUNDING AREAS OF SKIN BY TOPICAL ROUTE QID prn    . diphenhydrAMINE (BENADRYL) 25 MG tablet Take 25 mg by mouth as needed for allergies.     . fluticasone (FLONASE) 50 MCG/ACT nasal spray Place 2 sprays into both nostrils daily. 16 g 6  . Norethin Ace-Eth Estrad-FE (LOESTRIN FE 1/20  PO) Take by mouth.    . phentermine 37.5 MG capsule Take 1 capsule (37.5 mg total) by mouth every morning. 30 capsule 0   No current facility-administered medications on file prior to visit.    BP 120/90   Temp 98.2 F (36.8 C)   Wt (!) 315 lb (142.9 kg)   BMI 46.52 kg/m       Objective:  Physical Exam Vitals and nursing note reviewed.  Constitutional:      Appearance: Normal appearance.  Cardiovascular:     Rate and Rhythm: Normal rate and regular rhythm.     Pulses: Normal pulses.     Heart sounds: Normal heart sounds.  Pulmonary:     Effort: Pulmonary effort is normal.     Breath sounds: Normal breath sounds.  Musculoskeletal:        General: Normal range of motion.  Skin:    General: Skin is warm and dry.     Capillary Refill: Capillary refill takes less than 2 seconds.  Neurological:     General: No focal deficit present.     Mental Status: She is oriented to person, place, and time.  Psychiatric:        Mood and Affect: Mood normal.        Behavior: Behavior normal.        Thought Content: Thought content normal.        Judgment: Judgment normal.       Assessment & Plan:  1. Other obesity - Continue with weight loss measures. Will continue Phentermine 37.5 mg.  - Follow up in one month or sooner if needed - phentermine 37.5 MG capsule; Take 1 capsule (37.5 mg total) by mouth every morning.  Dispense: 30 capsule; Refill: 0  2. BMI 45.0-49.9, adult ([facility])  - phentermine 37.5 MG capsule; Take 1 capsule (37.5 mg total) by mouth every morning.  Dispense: 30 capsule; Refill: 0   [name], NP

## 2019-11-01 ENCOUNTER — Telehealth: Payer: Self-pay | Admitting: Internal Medicine

## 2019-11-01 NOTE — Telephone Encounter (Signed)
Called patient to set up a follow up appointment for Chron's flare up with Dr. Carlean Purl left voicemail.

## 2019-11-14 ENCOUNTER — Ambulatory Visit: Payer: 59 | Admitting: Adult Health

## 2019-11-14 NOTE — Progress Notes (Deleted)
Subjective:    Patient ID: Sydney Bradley, female    DOB: 1980/11/19, 39 y.o.   MRN: [id number]  HPI 39 year old female who  has a past medical history of Arthritis, Asthma, Crohn's disease of ileum ([place]), Hypertension, IBS (irritable bowel syndrome), Morbid obesity with BMI of 45.0-49.9, adult ([place]), and Sacral fracture ([place]).  She presents to the office today for one month follow up regarding obesity management. She was restarted on Phentermine 4 months ago in April 2021. During her last visit Phentermine was increased to 37.5 mg. She reports that over the last month she has done well with the increased dose and has not had any side effects.   She continues to work on cutting back on portion size and is stopping when she I full.   Wt Readings from Last 3 Encounters:  10/12/19 (!) 315 lb (142.9 kg)  09/12/19 (!) 322 lb (146.1 kg)  08/14/19 (!) 321 lb (145.6 kg)    Review of Systems See HPI   Past Medical History:  Diagnosis Date  . Arthritis    L5 Back - no meds - otc prn  . Asthma   . Crohn's disease of ileum ([place])   . Hypertension    no meds x 3 yrs  . IBS (irritable bowel syndrome)   . Morbid obesity with BMI of 45.0-49.9, adult ([place])   . Sacral fracture [facility])     Social History   Socioeconomic History  . Marital status: Married    Spouse name: Not on file  . Number of children: 1  . Years of education: 70  . Highest education level: Not on file  Occupational History  . 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 Smoking status: Never Smoker  . Smokeless tobacco: Never Used  Vaping Use  . Vaping Use: Never used  Substance and Sexual Activity  . Alcohol use: No    Alcohol/week: 0.0 standard drinks  . Drug use: No  . Sexual activity: Yes    Birth control/protection: None  Other Topics Concern  . 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 Difficulty of Paying Living Expenses:   Food Insecurity:   . Worried About Charity fundraiser in the Last Year:   . Arboriculturist in the Last Year:   Transportation Needs:   . Film/video editor (Medical):   Marland Kitchen Lack of Transportation (Non-Medical):   Physical Activity:   . Days of Exercise per Week:   . Minutes of Exercise per Session:   Stress:   . Feeling of Stress :   Social Connections:   . Frequency of Communication with Friends and Family:   . Frequency of Social Gatherings with Friends and Family:   . Attends Religious Services:   . Active Member of Clubs or Organizations:   . Attends Archivist Meetings:   Marland Kitchen Marital Status:   Intimate Partner Violence:   . Fear of Current or Ex-Partner:   . Emotionally Abused:   Marland Kitchen Physically Abused:   . Sexually Abused:     Past Surgical History:  Procedure Laterality Date  . COLONOSCOPY    .  DILATION AND EVACUATION  02/25/2012   Procedure: DILATATION AND EVACUATION;  Surgeon: Daria Pastures, MD;  Location: [facility];  Service: Gynecology;  Laterality: N/A;  . DILATION AND EVACUATION N/A 03/01/2013   Procedure: DILATATION AND EVACUATION;  Surgeon: Olga Millers, MD;  Location: [facility];  Service: Gynecology;  Laterality: N/A;  . TONSILLECTOMY AND ADENOIDECTOMY  2007    Family History  Problem Relation Age of Onset  . COPD Maternal Grandmother   . Dementia Maternal Grandmother   . Cancer Paternal Grandmother        blood cancer?  . Stroke Paternal Grandmother   . Alzheimer's disease Maternal Grandfather   . Colon cancer Father        mets to liver and bone     Allergies  Allergen Reactions  . Latex Hives    Breathing problems  . 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 Shellfish Allergy Anaphylaxis  . Penicillins  Hives    Has patient had a PCN reaction causing immediate rash, facial/tongue/throat swelling, SOB or lightheadedness with hypotension: Yes Has patient had a PCN reaction causing severe rash involving mucus membranes or skin necrosis: No Has patient had a PCN reaction that required hospitalization No Has patient had a PCN reaction occurring within the last 10 years: No If all of the above answers are "NO", then may proceed with Cephalosporin use.   . Chocolate Itching  . Ciprofloxacin Hives  . Lamisil [Terbinafine] Hives  . Wheat Bran Itching    Current Outpatient Medications on File Prior to Visit  Medication Sig Dispense Refill  . albuterol (PROVENTIL) (2.5 MG/3ML) 0.083% nebulizer solution Take 3 mLs (2.5 mg total) by nebulization every 4 (four) hours as needed for wheezing or shortness of breath. 75 mL 0  . albuterol (VENTOLIN HFA) 108 (90 Base) MCG/ACT inhaler Inhale 2 puffs into the lungs every 4 (four) hours as needed for wheezing. 1 Inhaler 3  . celecoxib (CELEBREX) 100 MG capsule Take 1 capsule (100 mg total) by mouth 2 (two) times daily. 20 capsule 0  . cetirizine (ZYRTEC) 10 MG tablet Take 10 mg by mouth daily.     . clotrimazole-betamethasone (LOTRISONE) cream clotrimazole-betamethasone 1 %-0.05 % topical cream  APPLY TO THE AFFECTED AND SURROUNDING AREAS OF SKIN BY TOPICAL ROUTE QID prn    . diphenhydrAMINE (BENADRYL) 25 MG tablet Take 25 mg by mouth as needed for allergies.     . fluticasone (FLONASE) 50 MCG/ACT nasal spray Place 2 sprays into both nostrils daily. 16 g 6  . Norethin Ace-Eth Estrad-FE (LOESTRIN FE 1/20 PO) Take by mouth.    . phentermine 37.5 MG capsule Take 1 capsule (37.5 mg total) by mouth every morning. 30 capsule 0   No current facility-administered medications on file prior to visit.    There were no vitals taken for this visit.      Objective:   Physical Exam Vitals and nursing note reviewed.  Constitutional:      Appearance: Normal  appearance. She is obese.  Cardiovascular:     Rate and Rhythm: Normal rate and regular rhythm.     Pulses: Normal pulses.     Heart sounds: Normal heart sounds.  Musculoskeletal:        General: Normal range of motion.  Skin:    General: Skin is warm and dry.  Neurological:     General: No focal deficit present.     Mental Status: She is alert  and oriented to person, place, and time.  Psychiatric:        Mood and Affect: Mood normal.        Behavior: Behavior normal.        Thought Content: Thought content normal.        Judgment: Judgment normal.           Assessment & Plan:

## 2020-02-15 ENCOUNTER — Other Ambulatory Visit: Payer: Self-pay

## 2020-02-15 ENCOUNTER — Ambulatory Visit: Payer: 59 | Admitting: Adult Health

## 2020-02-15 ENCOUNTER — Encounter: Payer: Self-pay | Admitting: Adult Health

## 2020-02-15 VITALS — BP 138/90 | HR 97 | Temp 97.9°F | Wt 328.4 lb

## 2020-02-15 DIAGNOSIS — G43111 Migraine with aura, intractable, with status migrainosus: Secondary | ICD-10-CM

## 2020-02-15 MED ORDER — KETOROLAC TROMETHAMINE 60 MG/2ML IM SOLN
60.0000 mg | Freq: Once | INTRAMUSCULAR | Status: AC
Start: 2020-02-15 — End: 2020-02-15
  Administered 2020-02-15: 60 mg via INTRAMUSCULAR

## 2020-02-15 MED ORDER — SUMATRIPTAN SUCCINATE 50 MG PO TABS: 50.0000 mg | ORAL_TABLET | ORAL | 1 refills | Status: DC | PRN

## 2020-02-15 MED ORDER — METHYLPREDNISOLONE ACETATE 80 MG/ML IJ SUSP
80.0000 mg | Freq: Once | INTRAMUSCULAR | Status: AC
  Administered 2020-02-15: 80 mg via INTRAMUSCULAR

## 2020-02-15 NOTE — Progress Notes (Signed)
Subjective:    Patient ID: Sydney Bradley, female    DOB: 1980/05/27, 39 y.o.   MRN: [id number]  HPI 39 year old female who  has a past medical history of Arthritis, Asthma, Crohn's disease of ileum ([place]), Hypertension, IBS (irritable bowel syndrome), Morbid obesity with BMI of 45.0-49.9, adult ([place]), and Sacral fracture ([place]).  She presents to the office today for right sided headache that has been constant over the last month or so. She reports that when she wakes up in the morning she does not have a headache but by the time she gets to work her headache has developed.  Headache has been noticeable behind the right eye and right forehead.  Headache usually lasts all day.  Sometimes goes away in the evening.  She is seeing floaters and has an appointment with her eye doctor in a few weeks.   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  She does feel tight in her shoulders.  He has a history of migraines that were more apparent when she was pregnant.  Denies photophobia but questionable phonophobia.  No pain in the temporal area or when combing her hair.  Has not had any fevers or chills  Review of Systems See HPI   Past Medical History:  Diagnosis Date  . Arthritis    L5 Back - no meds - otc prn  . Asthma   . Crohn's disease of ileum ([place])   . Hypertension    no meds x 3 yrs  . IBS (irritable bowel syndrome)   . Morbid obesity with BMI of 45.0-49.9, adult ([place])   . Sacral fracture [facility])     Social History   Socioeconomic History  . Marital status: Married    Spouse name: Not on file  . Number of children: 1  . Years of education: 81  . Highest education level: Not on file  Occupational History  . 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 Smoking status: Never Smoker  . Smokeless tobacco: Never Used  Vaping Use  . Vaping Use: Never used    Substance and Sexual Activity  . Alcohol use: No    Alcohol/week: 0.0 standard drinks  . Drug use: No  . Sexual activity: Yes    Birth control/protection: None  Other Topics Concern  . 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 Difficulty of Paying Living Expenses: Not on file  Food Insecurity:   . Worried About Charity fundraiser in the Last Year: Not on file  . Ran Out of Food in the Last Year: Not on file  Transportation Needs:   . Lack of Transportation (Medical): Not on file  . Lack of Transportation (Non-Medical): Not on file  Physical Activity:   . Days of Exercise per Week: Not on file  . Minutes of Exercise per Session: Not on file  Stress:   . Feeling of Stress : Not on file  Social Connections:   . Frequency of Communication with Friends and Family: Not on file  . Frequency of Social Gatherings with Friends and Family: Not  on file  . Attends Religious Services: Not on file  . Active Member of Clubs or Organizations: Not on file  . Attends Archivist Meetings: Not on file  . Marital Status: Not on file  Intimate Partner Violence:   . Fear of Current or Ex-Partner: Not on file  . Emotionally Abused: Not on file  . Physically Abused: Not on file  . Sexually Abused: Not on file    Past Surgical History:  Procedure Laterality Date  . COLONOSCOPY    . DILATION AND EVACUATION  02/25/2012   Procedure: DILATATION AND EVACUATION;  Surgeon: Daria Pastures, MD;  Location: [facility];  Service: Gynecology;  Laterality: N/A;  . DILATION AND EVACUATION N/A 03/01/2013   Procedure: DILATATION AND EVACUATION;  Surgeon: Olga Millers, MD;  Location: [facility];  Service: Gynecology;  Laterality: N/A;  . TONSILLECTOMY AND ADENOIDECTOMY  2007     Family History  Problem Relation Age of Onset  . COPD Maternal Grandmother   . Dementia Maternal Grandmother   . Cancer Paternal Grandmother        blood cancer?  . Stroke Paternal Grandmother   . Alzheimer's disease Maternal Grandfather   . Colon cancer Father        mets to liver and bone     Allergies  Allergen Reactions  . Latex Hives    Breathing problems  . 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 Shellfish Allergy Anaphylaxis  . Penicillins Hives    Has patient had a PCN reaction causing immediate rash, facial/tongue/throat swelling, SOB or lightheadedness with hypotension: Yes Has patient had a PCN reaction causing severe rash involving mucus membranes or skin necrosis: No Has patient had a PCN reaction that required hospitalization No Has patient had a PCN reaction occurring within the last 10 years: No If all of the above answers are "NO", then may proceed with Cephalosporin use.   . Chocolate Itching  . Ciprofloxacin Hives  . Lamisil [Terbinafine] Hives  . Wheat Bran Itching    Current Outpatient Medications on File Prior to Visit  Medication Sig Dispense Refill  . albuterol (PROVENTIL) (2.5 MG/3ML) 0.083% nebulizer solution Take 3 mLs (2.5 mg total) by nebulization every 4 (four) hours as needed for wheezing or shortness of breath. 75 mL 0  . albuterol (VENTOLIN HFA) 108 (90 Base) MCG/ACT inhaler Inhale 2 puffs into the lungs every 4 (four) hours as needed for wheezing. 1 Inhaler 3  . cetirizine (ZYRTEC) 10 MG tablet Take 10 mg by mouth daily.     . clotrimazole-betamethasone (LOTRISONE) cream clotrimazole-betamethasone 1 %-0.05 % topical cream  APPLY TO THE AFFECTED AND SURROUNDING AREAS OF SKIN BY TOPICAL ROUTE QID prn    . diphenhydrAMINE (BENADRYL) 25 MG tablet Take 25 mg by mouth as needed for allergies.     . fluticasone (FLONASE) 50 MCG/ACT nasal spray Place 2 sprays into both nostrils daily. 16 g 6  . celecoxib  (CELEBREX) 100 MG capsule Take 1 capsule (100 mg total) by mouth 2 (two) times daily. (Patient not taking: Reported on 02/15/2020) 20 capsule 0  . Norethin Ace-Eth Estrad-FE (LOESTRIN FE 1/20 PO) Take by mouth. (Patient not taking: Reported on 02/15/2020)    . phentermine 37.5 MG capsule Take 1 capsule (37.5 mg total) by mouth every morning. (Patient not taking: Reported on 02/15/2020) 30 capsule 0   No current facility-administered medications on file prior to visit.    BP 138/90 (  BP Location: Left Arm, Patient Position: Sitting, Cuff Size: Large)   Pulse 97   Temp 97.9 F (36.6 C) (Oral)   Wt (!) 328 lb 6.4 oz (149 kg)   LMP 01/30/2020   SpO2 100%   BMI 48.50 kg/m      Objective:   Physical Exam Vitals and nursing note reviewed.  Constitutional:      Appearance: She is well-developed.  HENT:     Head: Normocephalic and atraumatic.     Right Ear: Tympanic membrane, ear canal and external ear normal. There is no impacted cerumen.     Left Ear: Tympanic membrane, ear canal and external ear normal. There is no impacted cerumen.     Mouth/Throat:     Mouth: Mucous membranes are moist.     Pharynx: Oropharynx is clear.  Eyes:     Extraocular Movements: Extraocular movements intact.     Right eye: Normal extraocular motion and no nystagmus.     Left eye: Normal extraocular motion and no nystagmus.     Pupils: Pupils are equal, round, and reactive to light.     Right eye: Pupil is round and reactive.     Left eye: Pupil is round and reactive.  Cardiovascular:     Rate and Rhythm: Normal rate and regular rhythm.     Pulses: Normal pulses.     Heart sounds: Normal heart sounds.  Pulmonary:     Effort: Pulmonary effort is normal.     Breath sounds: Normal breath sounds.  Musculoskeletal:        General: Tenderness (Muscular tenderness throughout scapular regions) present. Normal range of motion.     Cervical back: Normal range of motion and neck supple.  Skin:    General: Skin is  warm and dry.  Neurological:     General: No focal deficit present.     Mental Status: She is alert and oriented to person, place, and time.  Psychiatric:        Mood and Affect: Mood normal.        Behavior: Behavior normal.        Thought Content: Thought content normal.        Judgment: Judgment normal.       Assessment & Plan:  1. Intractable migraine with aura with status migrainosus -Likely migraine.  We will have her follow-up with her eye doctor still the.  Toradol and Depo-Medrol injection given in the office today and will send in Imitrex for her.  She was advised to follow-up if no improvement and at that time we can consider MRI of the brain - ketorolac (TORADOL) injection 60 mg - methylPREDNISolone acetate (DEPO-MEDROL) injection 80 mg - SUMAtriptan (IMITREX) 50 MG tablet; Take 1 tablet (50 mg total) by mouth every 2 (two) hours as needed for migraine. May repeat in 2 hours if headache persists or recurs.  Dispense: 10 tablet; Refill: 1  [name], NP

## 2020-03-26 ENCOUNTER — Encounter: Payer: Self-pay | Admitting: Family Medicine

## 2020-03-26 ENCOUNTER — Telehealth (INDEPENDENT_AMBULATORY_CARE_PROVIDER_SITE_OTHER): Payer: 59 | Admitting: Family Medicine

## 2020-03-26 DIAGNOSIS — J069 Acute upper respiratory infection, unspecified: Secondary | ICD-10-CM

## 2020-03-26 DIAGNOSIS — Z7189 Other specified counseling: Secondary | ICD-10-CM | POA: Diagnosis not present

## 2020-03-26 NOTE — Progress Notes (Signed)
Virtual Visit via Telephone Note  I connected with Sydney Bradley on 03/26/20 at  3:30 PM EST by telephone and verified that I am speaking with the correct person using two identifiers.   I discussed the limitations, risks, security and privacy concerns of performing an evaluation and management service by telephone and the availability of in person appointments. I also discussed with the patient that there may be a patient responsible charge related to this service. The patient expressed understanding and agreed to proceed.  Location patient: home Location provider: work or home office Participants present for the call: patient, provider Patient did not have a visit in the prior 7 days to address this/these issue(s).   History of Present Illness: Pt is a 39 year old female with pmh sig for HTN, asthma, IBS, GERD, obesity, Crohn's with use of immunosuppressant medication who was seen by [name], NP and seen for acute concern.  Pt states she got sick from her son who is became sick Sunday after getting his 2nd COVID vaccine Saturday.  Pt's son tested negative for strep and flu this am.  Pt endorses scratchy throat, HA, congestion, cough, hoarse voice that started this am.  Pt taking zyrtec, benadryl, and flonase for her symptoms. Another family member became sick after babysitting her son.  Pt is suppose to go to work tomorrow.  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  Asks about virtual court.    Observations/Objective: Patient sounds cheerful and well on the phone. I do not appreciate any SOB. Speech and thought processing are grossly intact. Patient reported vitals:  Assessment and Plan: Viral URI with cough  -Viral illness less likely influenza or Covid pt developed symptoms from her son who had negative strep and influenza testing today -Discussed supportive care including Tylenol or NSAIDs, fluids, rest, gargling with warm salt water or Chloraseptic spray -Okay to continue Flonase  and allergy medication -Given precautions -Given note for work advising pt would benefit from working remote 2/2 ongoing illness.  Educated about COVID-19 virus infection -Discussed s/s of COVID-19 virus infection -Consider Covid testing at area testing sites or local pharmacy -given precautions   Follow Up Instructions: F/u prn  I did not refer this patient for an OV in the next 24 hours for this/these issue(s).  I discussed the assessment and treatment plan with the patient. The patient was provided an opportunity to ask questions and all were answered. The patient agreed with the plan and demonstrated an understanding of the instructions.   The patient was advised to call back or seek an in-person evaluation if the symptoms worsen or if the condition fails to improve as anticipated.  I provided 12 minutes of non-face-to-face time during this encounter.   Billie Ruddy, MD

## 2020-04-01 ENCOUNTER — Telehealth (INDEPENDENT_AMBULATORY_CARE_PROVIDER_SITE_OTHER): Payer: 59 | Admitting: Family Medicine

## 2020-04-01 ENCOUNTER — Encounter (HOSPITAL_COMMUNITY): Payer: Self-pay | Admitting: Emergency Medicine

## 2020-04-01 ENCOUNTER — Emergency Department (HOSPITAL_COMMUNITY): Payer: 59

## 2020-04-01 DIAGNOSIS — J4520 Mild intermittent asthma, uncomplicated: Secondary | ICD-10-CM | POA: Diagnosis not present

## 2020-04-01 DIAGNOSIS — Z91040 Latex allergy status: Secondary | ICD-10-CM | POA: Diagnosis not present

## 2020-04-01 DIAGNOSIS — R059 Cough, unspecified: Secondary | ICD-10-CM | POA: Diagnosis not present

## 2020-04-01 DIAGNOSIS — Z79899 Other long term (current) drug therapy: Secondary | ICD-10-CM | POA: Insufficient documentation

## 2020-04-01 DIAGNOSIS — J019 Acute sinusitis, unspecified: Secondary | ICD-10-CM | POA: Insufficient documentation

## 2020-04-01 DIAGNOSIS — I10 Essential (primary) hypertension: Secondary | ICD-10-CM | POA: Insufficient documentation

## 2020-04-01 DIAGNOSIS — Z20822 Contact with and (suspected) exposure to covid-19: Secondary | ICD-10-CM | POA: Diagnosis not present

## 2020-04-01 DIAGNOSIS — R519 Headache, unspecified: Secondary | ICD-10-CM

## 2020-04-01 DIAGNOSIS — R0981 Nasal congestion: Secondary | ICD-10-CM

## 2020-04-01 DIAGNOSIS — J45909 Unspecified asthma, uncomplicated: Secondary | ICD-10-CM | POA: Diagnosis not present

## 2020-04-01 LAB — RESP PANEL BY RT-PCR (FLU A&B, COVID) ARPGX2
Influenza A by PCR: NEGATIVE
Influenza B by PCR: NEGATIVE
SARS Coronavirus 2 by RT PCR: NEGATIVE

## 2020-04-01 MED ORDER — BENZONATATE 100 MG PO CAPS
100.0000 mg | ORAL_CAPSULE | Freq: Three times a day (TID) | ORAL | 0 refills | Status: DC | PRN
Start: 2020-04-01 — End: 2020-08-15

## 2020-04-01 MED ORDER — DOXYCYCLINE HYCLATE 100 MG PO TABS
100.0000 mg | ORAL_TABLET | Freq: Two times a day (BID) | ORAL | 0 refills | Status: DC
Start: 2020-04-01 — End: 2020-08-15

## 2020-04-01 NOTE — Progress Notes (Signed)
Virtual Visit via Video Note  I connected with Sydney Bradley  on 04/01/20 at  1:20 PM EST by a video enabled telemedicine application and verified that I am speaking with the correct person using two identifiers.  Location patient: home, [place] Location provider:work or home office Persons participating in the virtual visit: patient, provider  I discussed the limitations of evaluation and management by telemedicine and the availability of in person appointments. The patient expressed understanding and agreed to proceed.   HPI:  Acute telemedicine visit for cough: -Onset:7 days ago -Symptoms include: nasal congestion, cough, sore throat, PND, mild aches, laryngitis - now worsening with sinus discomfort, cough up some thick green mucus and some wheezing occasionally but has not used her albuterol -son with similar symptoms - negative for strep and flu -Denies: fevers, vomiting, change in bowels, CP, SOB -Has tried: flonase, antihistamine -Pertinent past medical history: asthma, IBD, HTN, obesity -Pertinent medication allergies: penicillin, cipro, lamisil -COVID-19 vaccine status: fully vaccinated for COVID; has not had flu shot or covid booster  ROS: See pertinent positives and negatives per HPI.  Past Medical History:  Diagnosis Date  . Arthritis    L5 Back - no meds - otc prn  . Asthma   . Crohn's disease of ileum ([place])   . Hypertension    no meds x 3 yrs  . IBS (irritable bowel syndrome)   . Morbid obesity with BMI of 45.0-49.9, adult ([place])   . Sacral fracture [facility])     Past Surgical History:  Procedure Laterality Date  . COLONOSCOPY    . DILATION AND EVACUATION  02/25/2012   Procedure: DILATATION AND EVACUATION;  Surgeon: Daria Pastures, MD;  Location: [facility];  Service: Gynecology;  Laterality: N/A;  . DILATION AND EVACUATION N/A 03/01/2013   Procedure: DILATATION AND EVACUATION;  Surgeon: Olga Millers, MD;  Location: [facility];  Service: Gynecology;  Laterality: N/A;  .  TONSILLECTOMY AND ADENOIDECTOMY  2007     Current Outpatient Medications:  .  albuterol (PROVENTIL) (2.5 MG/3ML) 0.083% nebulizer solution, Take 3 mLs (2.5 mg total) by nebulization every 4 (four) hours as needed for wheezing or shortness of breath., Disp: 75 mL, Rfl: 0 .  albuterol (VENTOLIN HFA) 108 (90 Base) MCG/ACT inhaler, Inhale 2 puffs into the lungs every 4 (four) hours as needed for wheezing., Disp: 1 Inhaler, Rfl: 3 .  benzonatate (TESSALON PERLES) 100 MG capsule, Take 1 capsule (100 mg total) by mouth 3 (three) times daily as needed., Disp: 20 capsule, Rfl: 0 .  celecoxib (CELEBREX) 100 MG capsule, Take 1 capsule (100 mg total) by mouth 2 (two) times daily. (Patient not taking: Reported on 02/15/2020), Disp: 20 capsule, Rfl: 0 .  cetirizine (ZYRTEC) 10 MG tablet, Take 10 mg by mouth daily. , Disp: , Rfl:  .  clotrimazole-betamethasone (LOTRISONE) cream, clotrimazole-betamethasone 1 %-0.05 % topical cream  APPLY TO THE AFFECTED AND SURROUNDING AREAS OF SKIN BY TOPICAL ROUTE QID prn, Disp: , Rfl:  .  diphenhydrAMINE (BENADRYL) 25 MG tablet, Take 25 mg by mouth as needed for allergies. , Disp: , Rfl:  .  doxycycline (VIBRA-TABS) 100 MG tablet, Take 1 tablet (100 mg total) by mouth 2 (two) times daily., Disp: 20 tablet, Rfl: 0 .  fluticasone (FLONASE) 50 MCG/ACT nasal spray, Place 2 sprays into both nostrils daily., Disp: 16 g, Rfl: 6 .  Norethin Ace-Eth Estrad-FE (LOESTRIN FE 1/20 PO), Take by mouth. (Patient not taking: Reported on 02/15/2020), Disp: , Rfl:  .  phentermine 37.5 MG capsule, Take 1 capsule (37.5 mg total) by mouth every morning. (Patient not taking: Reported on 02/15/2020), Disp: 30 capsule, Rfl: 0 .  SUMAtriptan (IMITREX) 50 MG tablet, Take 1 tablet (50 mg total) by mouth every 2 (two) hours as needed for migraine. May repeat in 2 hours if headache persists or recurs., Disp: 10 tablet, Rfl: 1  EXAM:  VITALS per patient if applicable:  GENERAL: alert, oriented, appears well  and in no acute distress  HEENT: atraumatic, conjunttiva clear, no obvious abnormalities on inspection of external nose and ears  NECK: normal movements of the head and neck  LUNGS: on inspection no signs of respiratory distress, breathing rate appears normal, no obvious gross SOB, gasping or wheezing  CV: no obvious cyanosis  MS: moves all visible extremities without noticeable abnormality  PSYCH/NEURO: pleasant and cooperative, no obvious depression or anxiety, speech and thought processing grossly intact  ASSESSMENT AND PLAN:  Discussed the following assessment and plan:  Nasal congestion  Facial discomfort  Cough  Asthma, unspecified asthma severity, unspecified whether complicated, unspecified whether persistent  -we discussed possible serious and likely etiologies, options for evaluation and workup, limitations of telemedicine visit vs in person visit, treatment, treatment risks and precautions. Pt prefers to treat via telemedicine empirically rather than in person at this moment.  Query developing sinusitis given duration of symptoms, worsening, sinus discomfort and thick nasal congestion.  Seems like this started as a viral illness.  Possible asthma flare.  Opted to treat with nasal saline, Doxy-100 milligrams twice daily for 7 to 10 days if worsening or not improving over the next several days, albuterol as needed and Tessalon for cough.  Also advised staying home all sick and Covid testing.  Discussed options for testing, potential complications, precautions. Work/School slipped offered: provided in patient instructions   Scheduled follow up with PCP offered: Declined, she agrees to follow-up if needed Advised to seek prompt in person care if worsening, new symptoms arise, or if is not improving with treatment. Discussed options for inperson care if PCP office not available. Did let this patient know that I only do telemedicine on Tuesdays and Thursdays for [place]. Advised to  schedule follow up visit with PCP or [facility] if any further questions or concerns to avoid delays in care.   I discussed the assessment and treatment plan with the patient. The patient was provided an opportunity to ask questions and all were answered. The patient agreed with the plan and demonstrated an understanding of the instructions.     Lucretia Kern, DO

## 2020-04-01 NOTE — ED Triage Notes (Addendum)
Pt reports cough, headache, and sinus pain x 2 days. States that son had a URI, negative for COVID, a couple days ago. Tried Nyquil, Zyrtec, Benadryl, and flonase without relief. Telehealth doc sent her prescriptions today, but the pharmacy was closed. Reports a lot of nasal and chest congestion. Also reports L calf pain, no redness, swelling or heat noted.  Received albuterol and atrovent en route.

## 2020-04-01 NOTE — Patient Instructions (Addendum)
    ---------------------------------------------------------------------------------------------------------------------------      WORK SLIP:  Patient Sydney Bradley,  January 01, 1981, was seen for a medical visit today, 04/01/20 . Please excuse from work according to the [facility] guidelines for a COVID like illness. We advise 10 days minimum from the onset of symptoms (03/26/20) PLUS 1 day of no fever and improved symptoms. Will defer to employer for a sooner return to work if [place] testing is negative and the symptoms have resolved. Advise following CDC guidelines.    Sincerely: E-signature: Dr. Colin Benton, DO [place] Ph: [phone]   ------------------------------------------------------------------------------------------------------------------------------    HOME CARE TIPS:  -[place] testing information: https://[email or web address].org/ OR [phone] Most pharmacies also offer testing and home test kits. Follow up with your primary care doctor if positive.   -I sent the medication(s) we discussed to your pharmacy: Meds ordered this encounter  Medications  . doxycycline (VIBRA-TABS) 100 MG tablet    Sig: Take 1 tablet (100 mg total) by mouth 2 (two) times daily.    Dispense:  20 tablet    Refill:  0  . benzonatate (TESSALON PERLES) 100 MG capsule    Sig: Take 1 capsule (100 mg total) by mouth 3 (three) times daily as needed.    Dispense:  20 capsule    Refill:  0    Use youralbuterol inhaler for wheezing or excessive cough or asthma symptoms - every 4-6 hours as needed.  -can use tylenol or aleve if needed for fevers, aches and pains per instructions  -can use nasal saline a few times per day if nasal congestion  -stay hydrated, drink plenty of fluids and eat small healthy meals - avoid dairy  -can take 1000 IU Vit D3 and Vit C lozenges per instructions  -If the Covid test is positive, check out the CDC  website for more information on home care, transmission and treatment for COVID19  -follow up with your doctor in 2-3 days unless improving and feeling better  -stay home while sick, except to seek medical care, and if you have COVID19 please stay home for a full 10 days since the onset of symptoms PLUS one day of no fever and feeling better.  It was nice to meet you today, and I really hope you are feeling better soon. I help [place] out with telemedicine visits on Tuesdays and Thursdays and am available for visits on those days. If you have any concerns or questions following this visit please schedule a follow up visit with your Primary Care doctor or seek care at a local urgent care clinic to avoid delays in care.    Seek in person care promptly if your symptoms worsen, new concerns arise or you are not improving with treatment. Call 911 and/or seek emergency care if you symptoms are severe or life threatening.

## 2020-04-02 ENCOUNTER — Emergency Department (HOSPITAL_COMMUNITY)
Admission: EM | Admit: 2020-04-02 | Discharge: 2020-04-02 | Disposition: A | Discharge status: Home / Self Care | Payer: 59 | Attending: Emergency Medicine | Admitting: Emergency Medicine

## 2020-04-02 DIAGNOSIS — J019 Acute sinusitis, unspecified: Secondary | ICD-10-CM

## 2020-04-02 DIAGNOSIS — R059 Cough, unspecified: Secondary | ICD-10-CM

## 2020-04-02 MED ORDER — BENZONATATE 100 MG PO CAPS
100.0000 mg | ORAL_CAPSULE | Freq: Once | ORAL | Status: AC
  Administered 2020-04-02: 02:00:00 100 mg via ORAL
  Filled 2020-04-02: qty 1

## 2020-04-02 MED ORDER — DOXYCYCLINE HYCLATE 100 MG PO TABS
100.0000 mg | ORAL_TABLET | Freq: Once | ORAL | Status: AC
  Administered 2020-04-02: 02:00:00 100 mg via ORAL
  Filled 2020-04-02: qty 1

## 2020-04-02 NOTE — ED Provider Notes (Signed)
[facility] DEPT Provider Note   CSN: [id number] Arrival date & time: 04/01/20  2201     History Chief Complaint  Patient presents with  . Cough    Sydney Bradley is a 39 y.o. female.  Patient presents to the emergency department with a chief complaint of sinus congestion and cough.  She reports having been sick for over a week.  She states that her son is sick with the same.  She had a telehealth visit today, and had prescription sent to the pharmacy including doxycycline and Tessalon Perles, but she was unable to pick up the medications.  She states that she has had some wheezing, but has used her rescue inhalers.  The history is provided by the patient. No language interpreter was used.       Past Medical History:  Diagnosis Date  . Arthritis    L5 Back - no meds - otc prn  . Asthma   . Crohn's disease of ileum ([place])   . Hypertension    no meds x 3 yrs  . IBS (irritable bowel syndrome)   . Morbid obesity with BMI of 45.0-49.9, adult ([place])   . Sacral fracture [facility])     Patient Active Problem List   Diagnosis Date Noted  . Plantar fasciitis 07/28/2018  . Asthma 07/25/2018  . BMI 45.0-49.9, adult ([place]) 12/13/2017  . Long-term use of immunosuppressant medication - Cimzia 07/01/2017  . Crohn's ileitis, with diarrhea ([place]) 12/10/2016  . Gastroesophageal reflux disease 12/10/2016  . Chronic hypertension complicating or reason for care during pregnancy   . Pre-existing hypertension affecting pregnancy in second trimester, antepartum   . Other obesity 06/26/2013  . Mild persistent asthma 11/14/2012    Past Surgical History:  Procedure Laterality Date  . COLONOSCOPY    . DILATION AND EVACUATION  02/25/2012   Procedure: DILATATION AND EVACUATION;  Surgeon: Daria Pastures, MD;  Location: [facility];  Service: Gynecology;  Laterality: N/A;  . DILATION AND EVACUATION N/A 03/01/2013   Procedure: DILATATION AND EVACUATION;  Surgeon: Olga Millers, MD;  Location: [facility];  Service: Gynecology;  Laterality: N/A;  . TONSILLECTOMY AND ADENOIDECTOMY  2007     OB History    Gravida  3   Para  1   Term      Preterm  1   AB  2   Living  1     SAB  2   IAB      Ectopic      Multiple  0   Live Births  1           Family History  Problem Relation Age of Onset  . COPD Maternal Grandmother   . Dementia Maternal Grandmother   . Cancer Paternal Grandmother        blood cancer?  . Stroke Paternal Grandmother   . Alzheimer's disease Maternal Grandfather   . Colon cancer Father        mets to liver and bone     Social History   Tobacco Use  . Smoking status: Never Smoker  . Smokeless tobacco: Never Used  Vaping Use  . Vaping Use: Never used  Substance Use Topics  . Alcohol use: No    Alcohol/week: 0.0 standard drinks  . Drug use: No    Home Medications Prior to Admission medications   Medication Sig Start Date End Date Taking? Authorizing Provider  albuterol (PROVENTIL) (2.5 MG/3ML) 0.083% nebulizer solution Take 3 mLs (  2.5 mg total) by nebulization every 4 (four) hours as needed for wheezing or shortness of breath. 05/02/19   Laurey Morale, MD  albuterol (VENTOLIN HFA) 108 (90 Base) MCG/ACT inhaler Inhale 2 puffs into the lungs every 4 (four) hours as needed for wheezing. 08/03/18   Nafziger, Tommi Rumps, NP  benzonatate (TESSALON PERLES) 100 MG capsule Take 1 capsule (100 mg total) by mouth 3 (three) times daily as needed. 04/01/20   Lucretia Kern, DO  celecoxib (CELEBREX) 100 MG capsule Take 1 capsule (100 mg total) by mouth 2 (two) times daily. Patient not taking: Reported on 02/15/2020 08/14/19   [name], NP  cetirizine (ZYRTEC) 10 MG tablet Take 10 mg by mouth daily.     [provider]  clotrimazole-betamethasone (LOTRISONE) cream clotrimazole-betamethasone 1 %-0.05 % topical cream  APPLY TO THE AFFECTED AND SURROUNDING AREAS OF SKIN BY TOPICAL ROUTE QID prn    [provider]   diphenhydrAMINE (BENADRYL) 25 MG tablet Take 25 mg by mouth as needed for allergies.     [provider]  doxycycline (VIBRA-TABS) 100 MG tablet Take 1 tablet (100 mg total) by mouth 2 (two) times daily. 04/01/20   Lucretia Kern, DO  fluticasone (FLONASE) 50 MCG/ACT nasal spray Place 2 sprays into both nostrils daily. 01/11/18   Nafziger, Tommi Rumps, NP  Norethin Ace-Eth Estrad-FE (LOESTRIN FE 1/20 PO) Take by mouth. Patient not taking: Reported on 02/15/2020    [provider]  phentermine 37.5 MG capsule Take 1 capsule (37.5 mg total) by mouth every morning. Patient not taking: Reported on 02/15/2020 10/12/19   [name], NP  SUMAtriptan (IMITREX) 50 MG tablet Take 1 tablet (50 mg total) by mouth every 2 (two) hours as needed for migraine. May repeat in 2 hours if headache persists or recurs. 02/15/20   Nafziger, Tommi Rumps, NP    Allergies    Latex, Peanut-containing drug products, Shellfish allergy, Penicillins, Chocolate, Ciprofloxacin, Lamisil [terbinafine], and Wheat bran  Review of Systems   Review of Systems  All other systems reviewed and are negative.   Physical Exam Updated Vital Signs BP (!) 177/111 (BP Location: Right Arm)   Pulse 65   Temp 99 F (37.2 C) (Oral)   Resp 20   Ht 5' 9"  (1.753 m)   Wt (!) 148.8 kg   SpO2 95%   BMI 48.44 kg/m   Physical Exam Vitals and nursing note reviewed.  Constitutional:      General: She is not in acute distress.    Appearance: She is well-developed and well-nourished.  HENT:     Head: Normocephalic and atraumatic.  Eyes:     Conjunctiva/sclera: Conjunctivae normal.  Cardiovascular:     Rate and Rhythm: Normal rate and regular rhythm.     Heart sounds: No murmur heard.   Pulmonary:     Effort: Pulmonary effort is normal. No respiratory distress.     Breath sounds: Normal breath sounds.  Abdominal:     Palpations: Abdomen is soft.     Tenderness: There is no abdominal tenderness.  Musculoskeletal:         General: No edema. Normal range of motion.     Cervical back: Neck supple.  Skin:    General: Skin is warm and dry.  Neurological:     Mental Status: She is alert.  Psychiatric:        Mood and Affect: Mood and affect and mood normal.     ED Results / Procedures / Treatments  Labs (all labs ordered are listed, but only abnormal results are displayed) Labs Reviewed  RESP PANEL BY RT-PCR (FLU A&B, COVID) [id number]    EKG None  Radiology DG Chest Port 1 View  Result Date: 04/01/2020 CLINICAL DATA:  39 year old female with headache and cough. EXAM: PORTABLE CHEST 1 VIEW COMPARISON:  Chest radiograph dated 12/22/2014. FINDINGS: The heart size and mediastinal contours are within normal limits. Both lungs are clear. The visualized skeletal structures are unremarkable. IMPRESSION: No active disease. Electronically Signed   By: Anner Crete M.D.   On: 04/01/2020 22:59    Procedures Procedures (including critical care time)  Medications Ordered in ED Medications  doxycycline (VIBRA-TABS) tablet 100 mg (has no administration in time range)  benzonatate (TESSALON) capsule 100 mg (has no administration in time range)    ED Course  I have reviewed the triage vital signs and the nursing notes.  Pertinent labs & imaging results that were available during my care of the patient were reviewed by me and considered in my medical decision making (see chart for details).    MDM Rules/Calculators/A&P                          Patient here with sinus congestion.  She had doxycycline called in by telehealth today, but she was unable to pick it up.  We will give her dose here.  Will give dose of Tessalon Perles.  Chest x-ray negative.  Covid/flu were negative.  She is not hypoxic nor tachycardic, doubt PE.  I suspect that patient has sinusitis.  Will discharge with PCP follow-up. Final Clinical Impression(s) / ED Diagnoses Final diagnoses:  Cough  Acute sinusitis, recurrence not specified,  unspecified location    Rx / DC Orders ED Discharge Orders    None       Montine Circle, PA-C 04/02/20 0201    Mesner, Corene Cornea, MD 04/02/20 [id number]

## 2020-04-21 ENCOUNTER — Encounter: Payer: Self-pay | Admitting: Adult Health

## 2020-08-15 ENCOUNTER — Ambulatory Visit (INDEPENDENT_AMBULATORY_CARE_PROVIDER_SITE_OTHER): Payer: 59

## 2020-08-15 ENCOUNTER — Encounter: Payer: Self-pay | Admitting: Adult Health

## 2020-08-15 ENCOUNTER — Other Ambulatory Visit: Payer: Self-pay

## 2020-08-15 ENCOUNTER — Ambulatory Visit: Payer: 59 | Admitting: Adult Health

## 2020-08-15 VITALS — BP 112/80 | HR 90 | Temp 98.4°F | Ht 69.0 in | Wt 327.6 lb

## 2020-08-15 DIAGNOSIS — M25561 Pain in right knee: Secondary | ICD-10-CM

## 2020-08-15 DIAGNOSIS — E668 Other obesity: Secondary | ICD-10-CM | POA: Diagnosis not present

## 2020-08-15 MED ORDER — METHYLPREDNISOLONE 4 MG PO TBPK: ORAL_TABLET | ORAL | 0 refills | Status: DC

## 2020-08-15 MED ORDER — CELECOXIB 100 MG PO CAPS: 100.0000 mg | ORAL_CAPSULE | Freq: Two times a day (BID) | ORAL | 0 refills | Status: DC

## 2020-08-15 MED ORDER — PHENTERMINE HCL 30 MG PO CAPS
30.0000 mg | ORAL_CAPSULE | ORAL | 0 refills | Status: DC
Start: 2020-08-15 — End: 2020-09-16

## 2020-08-15 NOTE — Progress Notes (Signed)
Subjective:    Patient ID: Sydney Bradley, female    DOB: Sep 01, 1980, 40 y.o.   MRN: [id number]  HPI 40 year old female who  has a past medical history of Arthritis, Asthma, Crohn's disease of ileum ([place]), Hypertension, IBS (irritable bowel syndrome), Morbid obesity with BMI of 45.0-49.9, adult ([place]), and Sacral fracture ([place]).  She presents to the office today for right knee pain. Injured about a week ago while riding her [place]. Initially pain was not significant but once the weather changed her pain became worse. Pain located throughout right knee. No redness noted. ? Swelling. Has been using tylenol without relief.   She would also like to restart phentermine to help aid in weight loss. She is eating at home a lot more and her diet is much healthier.   Review of Systems See HPI   Past Medical History:  Diagnosis Date  . Arthritis    L5 Back - no meds - otc prn  . Asthma   . Crohn's disease of ileum ([place])   . Hypertension    no meds x 3 yrs  . IBS (irritable bowel syndrome)   . Morbid obesity with BMI of 45.0-49.9, adult ([place])   . Sacral fracture [facility])     Social History   Socioeconomic History  . Marital status: Married    Spouse name: Not on file  . Number of children: 1  . Years of education: 29  . Highest education level: Not on file  Occupational History  . 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 Smoking status: Never Smoker  . Smokeless tobacco: Never Used  Vaping Use  . Vaping Use: Never used  Substance and Sexual Activity  . Alcohol use: No    Alcohol/week: 0.0 standard drinks  . Drug use: No  . Sexual activity: Yes    Birth control/protection: None  Other Topics Concern  . 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 COLONOSCOPY    . DILATION AND EVACUATION  02/25/2012   Procedure: DILATATION AND EVACUATION;  Surgeon: Daria Pastures, MD;  Location: [facility];  Service: Gynecology;  Laterality: N/A;  . DILATION AND EVACUATION N/A 03/01/2013   Procedure: DILATATION AND EVACUATION;  Surgeon: Olga Millers, MD;  Location: [facility];  Service: Gynecology;  Laterality: N/A;  . TONSILLECTOMY AND ADENOIDECTOMY  2007    Family History  Problem Relation Age of Onset  . COPD Maternal Grandmother   . Dementia Maternal Grandmother   . Cancer Paternal Grandmother        blood cancer?  . Stroke Paternal Grandmother   . Alzheimer's disease Maternal Grandfather   . Colon cancer Father        mets to liver and bone     Allergies  Allergen Reactions  . Latex Hives    Breathing problems  . Peanut-Containing Drug Products Anaphylaxis  Allergy to all nuts   . Shellfish Allergy Anaphylaxis  . Penicillins Hives    Has patient had a PCN reaction causing immediate rash, facial/tongue/throat swelling, SOB or lightheadedness with hypotension: Yes Has patient had a PCN reaction causing severe rash involving mucus membranes or skin necrosis: No Has patient had a PCN reaction that required hospitalization No Has patient had a PCN reaction occurring within the last 10 years: No If all of the above answers are "NO", then may proceed with Cephalosporin use.   . Chocolate Itching  . Ciprofloxacin Hives  . Lamisil [Terbinafine] Hives  . Wheat Bran Itching    Current Outpatient Medications on File Prior to Visit  Medication Sig Dispense Refill  . albuterol (PROVENTIL) (2.5 MG/3ML) 0.083% nebulizer  solution Take 3 mLs (2.5 mg total) by nebulization every 4 (four) hours as needed for wheezing or shortness of breath. 75 mL 0  . albuterol (VENTOLIN HFA) 108 (90 Base) MCG/ACT inhaler Inhale 2 puffs into the lungs every 4 (four) hours as needed for wheezing. 1 Inhaler 3  . cetirizine (ZYRTEC) 10 MG tablet Take 10 mg by mouth daily.     . clotrimazole-betamethasone (LOTRISONE) cream clotrimazole-betamethasone 1 %-0.05 % topical cream  APPLY TO THE AFFECTED AND SURROUNDING AREAS OF SKIN BY TOPICAL ROUTE QID prn    . diphenhydrAMINE (BENADRYL) 25 MG tablet Take 25 mg by mouth as needed for allergies.     . fluticasone (FLONASE) 50 MCG/ACT nasal spray Place 2 sprays into both nostrils daily. 16 g 6  . Norethin Ace-Eth Estrad-FE (LOESTRIN FE 1/20 PO) Take by mouth.    . SUMAtriptan (IMITREX) 50 MG tablet Take 1 tablet (50 mg total) by mouth every 2 (two) hours as needed for migraine. May repeat in 2 hours if headache persists or recurs. 10 tablet 1   No current facility-administered medications on file prior to visit.    BP 112/80 (BP Location: Left Arm, Patient Position: Sitting, Cuff Size: Large)   Pulse 90   Temp 98.4 F (36.9 C) (Oral)   Ht 5' 9"  (1.753 m)   Wt (!) 327 lb 9.6 oz (148.6 kg)   SpO2 97%   BMI 48.38 kg/m       Objective:   Physical Exam Vitals and nursing note reviewed.  Musculoskeletal:     Right knee: Bony tenderness present. No crepitus. Normal range of motion. Tenderness present over the MCL, LCL, ACL, PCL and patellar tendon. No LCL laxity, MCL laxity, ACL laxity or PCL laxity. Normal alignment, normal meniscus and normal patellar mobility.     Instability Tests: Anterior drawer test negative. Posterior drawer test negative.     Left knee: Normal.  Skin:    General: Skin is warm and dry.     Capillary Refill: Capillary refill takes less than 2 seconds.  Neurological:     General: No focal deficit present.     Mental Status: She is oriented to person, place, and  time.     Gait: Gait abnormal (slight right sided limp ).  Psychiatric:        Mood and Affect: Mood normal.        Behavior: Behavior normal.        Thought Content: Thought content normal.        Judgment: Judgment normal.       Assessment & Plan:  1. 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 Likely ligament/tendon injury  but will check xray. Consider MRI  - Advised rest, ice and compression  - celecoxib (CELEBREX) 100 MG capsule; Take 1 capsule (100 mg total) by mouth 2 (two) times daily.  Dispense: 20 capsule; Refill: 0 - methylPREDNISolone (MEDROL DOSEPAK) 4 MG TBPK tablet; Take as directed  Dispense: 21 tablet; Refill: 0 - DG Knee 1-2 Views Right; Future  2. Other obesity  - phentermine 30 MG capsule; Take 1 capsule (30 mg total) by mouth every morning.  Dispense: 30 capsule; Refill: 0  [name], NP

## 2020-08-19 ENCOUNTER — Other Ambulatory Visit: Payer: Self-pay | Admitting: Adult Health

## 2020-08-19 DIAGNOSIS — M25561 Pain in right knee: Secondary | ICD-10-CM

## 2020-09-06 ENCOUNTER — Other Ambulatory Visit: Payer: Self-pay

## 2020-09-06 ENCOUNTER — Ambulatory Visit
Admission: RE | Admit: 2020-09-06 | Discharge: 2020-09-06 | Disposition: A | Discharge status: Home / Self Care | Payer: 59 | Source: Ambulatory Visit | Attending: Adult Health | Admitting: Adult Health

## 2020-09-06 DIAGNOSIS — M25561 Pain in right knee: Secondary | ICD-10-CM

## 2020-09-16 ENCOUNTER — Other Ambulatory Visit: Payer: Self-pay

## 2020-09-16 ENCOUNTER — Encounter: Payer: Self-pay | Admitting: Adult Health

## 2020-09-16 ENCOUNTER — Ambulatory Visit: Payer: 59 | Admitting: Adult Health

## 2020-09-16 VITALS — BP 122/86 | HR 95 | Temp 98.0°F | Ht 69.0 in | Wt 324.0 lb

## 2020-09-16 DIAGNOSIS — M25561 Pain in right knee: Secondary | ICD-10-CM | POA: Diagnosis not present

## 2020-09-16 DIAGNOSIS — E668 Other obesity: Secondary | ICD-10-CM

## 2020-09-16 DIAGNOSIS — Z6841 Body Mass Index (BMI) 40.0 and over, adult: Secondary | ICD-10-CM | POA: Diagnosis not present

## 2020-09-16 MED ORDER — METHYLPREDNISOLONE ACETATE 80 MG/ML IJ SUSP
80.0000 mg | Freq: Once | INTRAMUSCULAR | Status: AC
  Administered 2020-09-16: 80 mg via INTRA_ARTICULAR

## 2020-09-16 MED ORDER — PHENTERMINE HCL 30 MG PO CAPS: 30.0000 mg | ORAL_CAPSULE | ORAL | 0 refills | Status: DC

## 2020-09-16 NOTE — Progress Notes (Signed)
Subjective:    Patient ID: Sydney Bradley, female    DOB: 1981/02/16, 40 y.o.   MRN: [id number]  HPI  40 year old female who  has a past medical history of Arthritis, Asthma, Crohn's disease of ileum ([place]), Hypertension, IBS (irritable bowel syndrome), Morbid obesity with BMI of 45.0-49.9, adult ([place]), and Sacral fracture ([place]).  She presents to the office today for follow up regarding weight loss management.  Her last visit a month ago she was restarted on phentermine 30 mg. Since restarting her medication she has not spearing's any side effects such as insomnia, palpitations, anorexia. She has been able to lose three pounds. She is interested in talking to a Ambulance person. Would like to continue to with phentermine   Wt Readings from Last 3 Encounters:  09/16/20 (!) 324 lb (147 kg)  08/15/20 (!) 327 lb 9.6 oz (148.6 kg)  04/01/20 (!) 328 lb (148.8 kg)   She also continues to have pain in her right knee but feels as though is may have improved slightly. MRI showed IMPRESSION: 1. No meniscal or ligamentous injury of the right knee. 2. 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 3.  No acute osseous injury of the right knee.  She would like to get back to exercising.   Review of Systems See HPI   Past Medical History:  Diagnosis Date  . Arthritis    L5 Back - no meds - otc prn  . Asthma   . Crohn's disease of ileum ([place])   . Hypertension    no meds x 3 yrs  . IBS (irritable bowel syndrome)   . Morbid obesity with BMI of 45.0-49.9, adult ([place])   . Sacral fracture [facility])     Social History   Socioeconomic History  . Marital status: Married    Spouse name: Not on file  . Number of children: 1  . Years of education: 60  . Highest education level: Not on file  Occupational History  . 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 Smoking status: Never Smoker  . Smokeless  tobacco: Never Used  Vaping Use  . Vaping Use: Never used  Substance and Sexual Activity  . Alcohol use: No    Alcohol/week: 0.0 standard drinks  . Drug use: No  . Sexual activity: Yes    Birth control/protection: None  Other Topics Concern  . 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 COLONOSCOPY    . DILATION AND EVACUATION  02/25/2012   Procedure: DILATATION AND EVACUATION;  Surgeon: Daria Pastures, MD;  Location: [facility];  Service: Gynecology;  Laterality: N/A;  . DILATION AND EVACUATION N/A 03/01/2013   Procedure: DILATATION AND EVACUATION;  Surgeon: Olga Millers, MD;  Location: [facility];  Service: Gynecology;  Laterality: N/A;  . TONSILLECTOMY AND ADENOIDECTOMY  2007    Family History  Problem Relation Age of Onset  . COPD Maternal Grandmother   . Dementia Maternal Grandmother   . Cancer Paternal Grandmother        blood cancer?  . Stroke Paternal Grandmother   . Alzheimer's disease Maternal Grandfather   . Colon cancer Father        mets to liver and bone     Allergies  Allergen Reactions  . Latex Hives    Breathing problems  . Other Anaphylaxis and Itching    Nuts Tree nuts  . 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 Shellfish Allergy Anaphylaxis  . Ciprofibrate Hives  . Penicillins Hives    Has patient had a PCN reaction causing immediate rash, facial/tongue/throat swelling, SOB or lightheadedness with hypotension: Yes Has patient had a PCN  reaction causing severe rash involving mucus membranes or skin necrosis: No Has patient had a PCN reaction that required hospitalization No Has patient had a PCN reaction occurring within the last 10 years: No If all of the above answers are "NO", then may proceed with Cephalosporin use.   . Chocolate Itching  . Ciprofloxacin Hives  . Lamisil [Terbinafine] Hives  . Wheat Bran Itching    Current Outpatient Medications on File Prior to Visit  Medication Sig Dispense Refill  . albuterol (PROVENTIL) (2.5 MG/3ML) 0.083% nebulizer solution Take 3 mLs (2.5 mg total) by nebulization every 4 (four) hours as needed for wheezing or shortness of breath. 75 mL 0  . albuterol (VENTOLIN HFA) 108 (90 Base) MCG/ACT inhaler Inhale 2 puffs into the lungs every 4 (four) hours as needed for wheezing. 1 Inhaler 3  . celecoxib (CELEBREX) 100 MG capsule Take 1 capsule (100 mg total) by mouth 2 (two) times daily. 20 capsule 0  . cetirizine (ZYRTEC) 10 MG tablet Take 10 mg by mouth daily.     . clotrimazole-betamethasone (LOTRISONE) cream clotrimazole-betamethasone 1 %-0.05 % topical cream  APPLY TO THE AFFECTED AND SURROUNDING AREAS OF SKIN BY TOPICAL ROUTE QID prn    . diphenhydrAMINE (BENADRYL) 25 MG tablet Take 25 mg by mouth as needed for allergies.     . fluticasone (FLONASE) 50 MCG/ACT nasal spray Place 2 sprays into both nostrils daily. 16 g 6  . Norethin Ace-Eth Estrad-FE (LOESTRIN FE 1/20 PO) Take by mouth.    . phentermine 30 MG capsule Take 1 capsule (30 mg total) by mouth every morning. 30 capsule 0  . methylPREDNISolone (MEDROL DOSEPAK) 4 MG TBPK tablet Take as directed (Patient not taking: Reported on 09/16/2020) 21 tablet 0   No current facility-administered medications on file prior to visit.    BP 122/86   Pulse 95   Temp 98 F (36.7 C) (Oral)   Ht 5' 9"  (1.753 m)   Wt (!) 324 lb (147 kg)   SpO2 100%   BMI 47.85 kg/m       Objective:   Physical Exam Vitals reviewed.  Constitutional:       Appearance: Normal appearance.  Cardiovascular:     Rate and Rhythm: Normal rate and regular rhythm.     Pulses: Normal pulses.     Heart sounds: Normal heart sounds.  Pulmonary:     Effort: Pulmonary effort is normal.     Breath sounds: Normal breath sounds.  Musculoskeletal:        General: Normal range of motion.  Skin:    General: Skin is warm and dry.  Neurological:     General:  No focal deficit present.     Mental Status: She is oriented to person, place, and time.  Psychiatric:        Mood and Affect: Mood normal.        Behavior: Behavior normal.        Thought Content: Thought content normal.        Judgment: Judgment normal.       Assessment & Plan:  1. Other obesity  - phentermine 30 MG capsule; Take 1 capsule (30 mg total) by mouth every morning.  Dispense: 30 capsule; Refill: 0 - Ambulatory referral to General Surgery  2. Acute pain of right knee Discussed risks and benefits of corticosteroid injection and patient consented.  After prepping skin with betadine, injected 80 mg depomedrol and 2 cc of plain xylocaine with 22 gauge one and one half inch needle using posterior approach and pt tolerated well.  - methylPREDNISolone acetate (DEPO-MEDROL) injection 80 mg - Ambulatory referral to Physical Therapy   [name], NP

## 2020-09-16 NOTE — Patient Instructions (Signed)
Health Maintenance Due  Topic Date Due  . Pneumococcal Vaccine 75-41 Years old (1 of 4 - PCV13) Never done  . Hepatitis C Screening  Never done  . COVID-19 Vaccine (3 - Booster) 10/23/2019    Depression screen [facility] 2/9 02/15/2020 08/04/2015 06/28/2014  Decreased Interest 0 0 0  Down, Depressed, Hopeless 0 0 0  PHQ - 2 Score 0 0 0

## 2020-10-15 ENCOUNTER — Ambulatory Visit: Payer: 59 | Admitting: Adult Health

## 2020-10-15 ENCOUNTER — Other Ambulatory Visit: Payer: Self-pay

## 2020-10-15 ENCOUNTER — Encounter: Payer: Self-pay | Admitting: Adult Health

## 2020-10-15 VITALS — BP 110/80 | Temp 97.0°F | Ht 69.0 in | Wt 320.0 lb

## 2020-10-15 DIAGNOSIS — E668 Other obesity: Secondary | ICD-10-CM | POA: Diagnosis not present

## 2020-10-15 MED ORDER — PHENTERMINE HCL 30 MG PO CAPS: 30.0000 mg | ORAL_CAPSULE | ORAL | 1 refills | Status: DC

## 2020-10-15 NOTE — Progress Notes (Signed)
Subjective:    Patient ID: Sydney Bradley, female    DOB: Oct 09, 1980, 40 y.o.   MRN: [id number]  HPI 40 year old female who  has a past medical history of Arthritis, Asthma, Crohn's disease of ileum ([place]), Hypertension, IBS (irritable bowel syndrome), Morbid obesity with BMI of 45.0-49.9, adult ([place]), and Sacral fracture ([place]).  She presents to the office today for follow-up regarding weight loss management.  2 months ago she was restarted on phentermine 30 mg.  Since starting this medication she has not experienced any side effects such as insomnia, palpitations, or anorexia.  During her visit a month ago she reported that she was finally interested in speaking with the bariatric surgeon for other options besides medication.  She reports that she continues to eat healthy. Her knee I feeling better and she plan to start exercising again.   She has received the paperwork to fill out from the bariatric surgery center.   Wt Readings from Last 3 Encounters:  10/15/20 (!) 320 lb (145.2 kg)  09/16/20 (!) 324 lb (147 kg)  08/15/20 (!) 327 lb 9.6 oz (148.6 kg)    Review of Systems See HPI   Past Medical History:  Diagnosis Date   Arthritis    L5 Back - no meds - otc prn   Asthma    Crohn's disease of ileum ([facility])    Hypertension    no meds x 3 yrs   IBS (irritable bowel syndrome)    Morbid obesity with BMI of 45.0-49.9, adult ([facility])    Sacral fracture ([facility])     Social History   Socioeconomic History   Marital status: Married    Spouse name: Not on file   Number of children: 1   Years of education: 18   Highest education level: Not on file  Occupational History   Occupation: Education officer, museum    Employer: 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[place], foster care   Married    One biologic son Cornelia Copa) born 2016 and a stepson   One caffeinated beverage daily   Social Determinants of Health   Financial Resource Strain: Not on file  Food Insecurity: Not on file  Transportation Needs: Not on file  Physical Activity: Not on file  Stress: Not on file  Social Connections: Not on file  Intimate Partner Violence: Not on file    Past Surgical History:  Procedure Laterality Date   COLONOSCOPY     DILATION AND EVACUATION  02/25/2012   Procedure: [place];  Surgeon: Daria Pastures, MD;  Location: [place] [place] [facility];  Service: Gynecology;  Laterality: N/A;   DILATION AND EVACUATION N/A 03/01/2013   Procedure: DILATATION AND EVACUATION;  Surgeon: Olga Millers, MD;  Location: [facility];  Service: Gynecology;  Laterality: N/A;   TONSILLECTOMY AND ADENOIDECTOMY  2007    Family History  Problem Relation Age of Onset   COPD Maternal Grandmother    Dementia Maternal Grandmother    Cancer Paternal Grandmother        blood  cancer?   Stroke Paternal Grandmother    Alzheimer's disease Maternal Grandfather    Colon cancer Father        mets to liver and bone     Allergies  Allergen Reactions   Latex Hives    Breathing problems   Other Anaphylaxis and Itching    Nuts Tree nuts   Peanut-Containing Drug Products Anaphylaxis    Allergy to all nuts    Shellfish Allergy Anaphylaxis   Ciprofibrate Hives   Penicillins Hives    Has patient had a PCN reaction causing immediate rash, facial/tongue/throat swelling, SOB or lightheadedness with hypotension: Yes Has patient had a PCN reaction causing severe rash involving mucus membranes or skin necrosis: No Has patient had a PCN reaction that required hospitalization No Has patient had a PCN reaction occurring within the last 10 years: No If  all of the above answers are "NO", then may proceed with Cephalosporin use.    Chocolate Itching   Ciprofloxacin Hives   Lamisil [Terbinafine] Hives   Wheat Bran Itching    Current Outpatient Medications on File Prior to Visit  Medication Sig Dispense Refill   albuterol (PROVENTIL) (2.5 MG/3ML) 0.083% nebulizer solution Take 3 mLs (2.5 mg total) by nebulization every 4 (four) hours as needed for wheezing or shortness of breath. 75 mL 0   albuterol (VENTOLIN HFA) 108 (90 Base) MCG/ACT inhaler Inhale 2 puffs into the lungs every 4 (four) hours as needed for wheezing. 1 Inhaler 3   celecoxib (CELEBREX) 100 MG capsule Take 1 capsule (100 mg total) by mouth 2 (two) times daily. 20 capsule 0   cetirizine (ZYRTEC) 10 MG tablet Take 10 mg by mouth daily.      clotrimazole-betamethasone (LOTRISONE) cream clotrimazole-betamethasone 1 %-0.05 % topical cream  APPLY TO THE AFFECTED AND SURROUNDING AREAS OF SKIN BY TOPICAL ROUTE QID prn     diphenhydrAMINE (BENADRYL) 25 MG tablet Take 25 mg by mouth as needed for allergies.      fluticasone (FLONASE) 50 MCG/ACT nasal spray Place 2 sprays into both nostrils daily. 16 g 6   Norethin Ace-Eth Estrad-FE (LOESTRIN FE 1/20 PO) Take by mouth.     phentermine 30 MG capsule Take 1 capsule (30 mg total) by mouth every morning. 30 capsule 0   No current facility-administered medications on file prior to visit.    BP 110/80   Temp (!) 97 F (36.1 C) (Oral)   Ht 5' 9"  (1.753 m)   Wt (!) 320 lb (145.2 kg)   BMI 47.26 kg/m       Objective:   Physical Exam Vitals and nursing note reviewed.  Constitutional:      Appearance: Normal appearance.  Cardiovascular:     Rate and Rhythm: Normal rate and regular rhythm.     Pulses: Normal pulses.     Heart sounds: Normal heart sounds.  Pulmonary:     Effort: Pulmonary effort is normal.     Breath sounds: Normal breath sounds.  Musculoskeletal:        General: Normal range of motion.  Skin:    General: Skin  is warm and dry.  Neurological:     General: No focal deficit present.     Mental Status: She is alert and oriented to person, place, and time.  Psychiatric:        Mood and Affect: Mood normal.        Behavior: Behavior normal.        Thought Content: Thought content  normal.        Judgment: Judgment normal.       Assessment & Plan:  1. Other obesity - continued weight loss. Will send in Phentermine for 2 months. Follow up in 3 month  - phentermine 30 MG capsule; Take 1 capsule (30 mg total) by mouth every morning.  Dispense: 30 capsule; Refill: 1  [name], NP

## 2020-12-10 ENCOUNTER — Encounter: Payer: Self-pay | Admitting: Adult Health

## 2020-12-11 ENCOUNTER — Telehealth: Payer: Self-pay | Admitting: Adult Health

## 2020-12-11 ENCOUNTER — Other Ambulatory Visit: Payer: Self-pay | Admitting: Adult Health

## 2020-12-11 DIAGNOSIS — E668 Other obesity: Secondary | ICD-10-CM

## 2020-12-11 MED ORDER — PHENTERMINE HCL 30 MG PO CAPS: 30.0000 mg | ORAL_CAPSULE | ORAL | 1 refills | Status: DC

## 2020-12-11 NOTE — Telephone Encounter (Signed)
Patient needs a refill on Centrimine called in.  Patient has been out for about 2 weeks.    Pharmacy-- [facility] on [address].

## 2020-12-17 ENCOUNTER — Ambulatory Visit: Payer: 59 | Admitting: Adult Health

## 2020-12-26 ENCOUNTER — Ambulatory Visit: Payer: 59 | Admitting: Adult Health

## 2020-12-26 ENCOUNTER — Other Ambulatory Visit: Payer: Self-pay

## 2020-12-26 ENCOUNTER — Encounter: Payer: Self-pay | Admitting: Adult Health

## 2020-12-26 VITALS — BP 128/90 | Temp 98.2°F | Ht 69.0 in | Wt 317.0 lb

## 2020-12-26 DIAGNOSIS — Z6841 Body Mass Index (BMI) 40.0 and over, adult: Secondary | ICD-10-CM | POA: Diagnosis not present

## 2020-12-26 DIAGNOSIS — F419 Anxiety disorder, unspecified: Secondary | ICD-10-CM

## 2020-12-26 MED ORDER — BUPROPION HCL ER (XL) 150 MG PO TB24: 150.0000 mg | ORAL_TABLET | Freq: Every day | ORAL | 1 refills | Status: DC

## 2020-12-26 MED ORDER — ALPRAZOLAM 0.25 MG PO TABS: 0.2500 mg | ORAL_TABLET | Freq: Two times a day (BID) | ORAL | 1 refills | Status: DC | PRN

## 2020-12-26 NOTE — Progress Notes (Signed)
Subjective:    Patient ID: Sydney Bradley, female    DOB: [birth year], 40 y.o.   MRN: [id number]  HPI 40 year old female who  has a past medical history of Arthritis, Asthma, Crohn's disease of ileum ([place]), Hypertension, IBS (irritable bowel syndrome), Morbid obesity with BMI of 45.0-49.9, adult ([place]), and Sacral fracture ([place]).  She presents to the office today for 90-monthfollow-up regarding obesity management and mild intermittent asthma .   In May 2022 she was restarted on phentermine 30 mg.  She has been doing well with this dose and has not had any adverse side effects.  . She has met with bariatric surgeon and plans to have surgery sometime.  She has been stress eating lately, she is dealing with a lot of stress and anxiety, reports that her dad's colon cancer has come back and they are giving him roughly 6 months with treatment.  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  Most of her anxiety stems from having to make the funeral arrangements and possibly having to pay for the funeral with the money that she saved up to have her bariatric surgery.  She does see a therapist who recommended that she to her PCP about starting medication.  Wt Readings from Last 3 Encounters:  12/26/20 (!) 317 lb (143.8 kg)  10/15/20 (!) 320 lb (145.2 kg)  09/16/20 (!) 324 lb (147 kg)    Review of Systems See HPI   Past Medical History:  Diagnosis Date   Arthritis    L5 Back - no meds - otc prn   Asthma    Crohn's disease of ileum (HCC)    Hypertension    no meds x 3 yrs   IBS (irritable bowel syndrome)    Morbid obesity with BMI of 45.0-49.9, adult (HCC)    Sacral fracture (HCC)     Social History   Socioeconomic History   Marital status: Married    Spouse name: Not on file   Number of children: 1   Years of education: 18   Highest education level: Not on file  Occupational History   Occupation: sResearch officer, political party   Employer: [employer] Tobacco Use   Smoking status: Never   Smokeless tobacco: Never  Vaping Use   Vaping Use: Never used  Substance and Sexual Activity   Alcohol use: No    Alcohol/week: 0.0 standard drinks   Drug use: No   Sexual activity: Yes    Birth control/protection: None  Other Topics Concern   Not on file  Social History Narrative   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[employer] [place]   Married    One biologic son (Cornelia Copa born 2016 and a stepson   One caffeinated beverage daily   Social Determinants of Health   Financial Resource Strain: Not on file  Food Insecurity: Not on file  Transportation Needs: Not on file  Physical Activity: Not on file  Stress: Not on file  Social Connections: Not on file  Intimate Partner Violence: Not on file    Past Surgical History:  Procedure Laterality Date   COLONOSCOPY     DILATION AND EVACUATION  02/25/2012   Procedure: [place]  Surgeon: MDaria Pastures MD;  [facility];  Service: Gynecology;  Laterality: N/A;   DILATION AND EVACUATION N/A 03/01/2013   Procedure:  DILATATION AND EVACUATION;  Surgeon: Olga Millers, MD;  Location: [facility];  Service: Gynecology;  Laterality: N/A;   TONSILLECTOMY AND ADENOIDECTOMY  2007    Family History  Problem Relation Age of Onset   COPD Maternal Grandmother    Dementia Maternal Grandmother    Cancer Paternal Grandmother        blood cancer?   Stroke Paternal Grandmother    Alzheimer's disease Maternal Grandfather    Colon cancer Father        mets to liver and bone     Allergies  Allergen Reactions   Latex Hives    Breathing problems   Other Anaphylaxis and Itching    Nuts Tree nuts   Peanut-Containing Drug Products Anaphylaxis    Allergy to all nuts    Shellfish Allergy Anaphylaxis   Ciprofibrate Hives   Penicillins Hives    Has patient had a PCN reaction causing immediate rash,  facial/tongue/throat swelling, SOB or lightheadedness with hypotension: Yes Has patient had a PCN reaction causing severe rash involving mucus membranes or skin necrosis: No Has patient had a PCN reaction that required hospitalization No Has patient had a PCN reaction occurring within the last 10 years: No If all of the above answers are "NO", then may proceed with Cephalosporin use.    Chocolate Itching   Ciprofloxacin Hives   Lamisil [Terbinafine] Hives   Wheat Bran Itching    Current Outpatient Medications on File Prior to Visit  Medication Sig Dispense Refill   albuterol (PROVENTIL) (2.5 MG/3ML) 0.083% nebulizer solution Take 3 mLs (2.5 mg total) by nebulization every 4 (four) hours as needed for wheezing or shortness of breath. 75 mL 0   albuterol (VENTOLIN HFA) 108 (90 Base) MCG/ACT inhaler Inhale 2 puffs into the lungs every 4 (four) hours as needed for wheezing. 1 Inhaler 3   celecoxib (CELEBREX) 100 MG capsule Take 1 capsule (100 mg total) by mouth 2 (two) times daily. 20 capsule 0   cetirizine (ZYRTEC) 10 MG tablet Take 10 mg by mouth daily.      clotrimazole-betamethasone (LOTRISONE) cream clotrimazole-betamethasone 1 %-0.05 % topical cream  APPLY TO THE AFFECTED AND SURROUNDING AREAS OF SKIN BY TOPICAL ROUTE QID prn     diphenhydrAMINE (BENADRYL) 25 MG tablet Take 25 mg by mouth as needed for allergies.      fluticasone (FLONASE) 50 MCG/ACT nasal spray Place 2 sprays into both nostrils daily. 16 g 6   phentermine 30 MG capsule Take 1 capsule (30 mg total) by mouth every morning. 30 capsule 1   No current facility-administered medications on file prior to visit.    BP 128/90   Temp 98.2 F (36.8 C) (Oral)   Ht 5' 9"  (1.753 m)   Wt (!) 317 lb (143.8 kg)   BMI 46.81 kg/m       Objective:   Physical Exam Vitals and nursing note reviewed.  Constitutional:      Appearance: Normal appearance.  Cardiovascular:     Rate and Rhythm: Normal rate and regular rhythm.      Pulses: Normal pulses.     Heart sounds: Normal heart sounds.  Pulmonary:     Effort: Pulmonary effort is normal.     Breath sounds: Normal breath sounds.  Musculoskeletal:        General: Normal range of motion.  Skin:    General: Skin is warm and dry.  Neurological:     General: No focal deficit present.  Mental Status: She is alert and oriented to person, place, and time.  Psychiatric:        Attention and Perception: Attention and perception normal.        Mood and Affect: Mood is anxious. Affect is angry and tearful.        Speech: Speech is rapid and pressured.        Behavior: Behavior is agitated. Behavior is cooperative.        Thought Content: Thought content normal.        Judgment: Judgment normal.      Assessment & Plan:  1. Anxiety -Start her on Wellbutrin 150 mg extended release daily prescribe her Xanax to take as needed.  We will have her follow-up in 1 month or sooner if needed - ALPRAZolam (XANAX) 0.25 MG tablet; Take 1 tablet (0.25 mg total) by mouth 2 (two) times daily as needed for anxiety.  Dispense: 60 tablet; Refill: 1 - buPROPion (WELLBUTRIN XL) 150 MG 24 hr tablet; Take 1 tablet (150 mg total) by mouth daily.  Dispense: 30 tablet; Refill: 1  2. BMI 45.0-49.9, adult (HCC) -Weight down another 3 pounds.  We will continue with phentermine 30 mg.  She does not need a refill at this time  [name], NP   Time spent on chart  time with patient; discussion of anxiety and obesity,  treatment, follow up plan, and documentation 30 minutes

## 2021-01-23 ENCOUNTER — Ambulatory Visit: Payer: 59 | Admitting: Adult Health

## 2021-01-23 ENCOUNTER — Other Ambulatory Visit: Payer: Self-pay

## 2021-01-23 ENCOUNTER — Encounter: Payer: Self-pay | Admitting: Adult Health

## 2021-01-23 VITALS — BP 120/88 | HR 81 | Temp 98.6°F | Ht 69.0 in | Wt 314.0 lb

## 2021-01-23 DIAGNOSIS — R7303 Prediabetes: Secondary | ICD-10-CM | POA: Diagnosis not present

## 2021-01-23 DIAGNOSIS — F419 Anxiety disorder, unspecified: Secondary | ICD-10-CM | POA: Diagnosis not present

## 2021-01-23 MED ORDER — TIRZEPATIDE 2.5 MG/0.5ML ~~LOC~~ SOAJ: 2.5000 mg | SUBCUTANEOUS | 0 refills | Status: DC

## 2021-01-23 MED ORDER — BUPROPION HCL ER (XL) 150 MG PO TB24: 150.0000 mg | ORAL_TABLET | Freq: Every day | ORAL | 1 refills | Status: DC

## 2021-01-23 NOTE — Progress Notes (Signed)
Subjective:    Patient ID: Sydney Bradley, female    DOB: 25-Apr-1980, 40 y.o.   MRN: [id number]  HPI 40 year old female who  has a past medical history of Arthritis, Asthma, Crohn's disease of ileum ([place]), Hypertension, IBS (irritable bowel syndrome), Morbid obesity with BMI of 45.0-49.9, adult ([place]), and Sacral fracture ([place]).  She presents to the office today for 1 month follow-up regarding anxiety.  During her last visit she reported that she had been stress eating quite frequently, she is dealing with a lot of stress and anxiety due to family issues, her father's colon cancer had come back and they had given him roughly 6 months with treatment.  She noticed that she was more on edge and becoming irritable with family members and work Medical laboratory scientific officer.  She was having anxiety all day with associated panic attacks.  She was seeing a therapist who recommended that she talk to me about going on medication.  We made the decision together to start her on Wellbutrin 150 mg extended release.  Today she reports that she is doing much better, she is crying less and feels as though her anxiety is much improved and feels much better overall. She would like to continue with this medication   Additionally, she has a history of pre diabetes. We have been using Phentermine to help with weight loss to bring her blood sugar levels down. Her husband recently started using Mounjaro and she would like to try this medication as well   Lab Results  Component Value Date   HGBA1C 5.6 08/14/2019       Review of Systems See HPI   Past Medical History:  Diagnosis Date   Arthritis    L5 Back - no meds - otc prn   Asthma    Crohn's disease of ileum (HCC)    Hypertension    no meds x 3 yrs   IBS (irritable bowel syndrome)    Morbid obesity with BMI of 45.0-49.9, adult (HCC)    Sacral fracture ([place])     Social History   Socioeconomic History   Marital status: Married    Spouse name: Not on file   Number of  children: 1   Years of education: 18   Highest education level: Not on file  Occupational History   Occupation: Education officer, museum    Employer: 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[place], foster care   Married    One biologic son Cornelia Copa) born 2016 and a stepson   One caffeinated beverage daily   Social Determinants of Health   Financial Resource Strain: Not on file  Food Insecurity: Not on file  Transportation Needs: Not on file  Physical Activity: Not on file  Stress: Not on file  Social Connections: Not on file  Intimate Partner Violence: Not on file    Past Surgical History:  Procedure Laterality Date   COLONOSCOPY     DILATION AND EVACUATION  02/25/2012   Procedure: [place];  Surgeon: Daria Pastures, MD;  Location: [facility];  Service: Gynecology;  Laterality: N/A;   DILATION AND EVACUATION N/A 03/01/2013   Procedure: DILATATION AND EVACUATION;  Surgeon: Olga Millers, MD;  Location: [facility];  Service: Gynecology;  Laterality: N/A;   TONSILLECTOMY AND ADENOIDECTOMY  2007    Family History  Problem Relation Age of Onset   COPD Maternal Grandmother    Dementia Maternal Grandmother    Cancer Paternal Grandmother        blood cancer?   Stroke Paternal Grandmother    Alzheimer's disease Maternal Grandfather    Colon cancer Father        mets to liver and bone     Allergies  Allergen Reactions   Latex Hives    Breathing problems   Other Anaphylaxis and Itching    Nuts Tree nuts   Peanut-Containing Drug Products Anaphylaxis    Allergy to all nuts    Shellfish  Allergy Anaphylaxis   Ciprofibrate Hives   Penicillins Hives    Has patient had a PCN reaction causing immediate rash, facial/tongue/throat swelling, SOB or lightheadedness with hypotension: Yes Has patient had a PCN reaction causing severe rash involving mucus membranes or skin necrosis: No Has patient had a PCN reaction that required hospitalization No Has patient had a PCN reaction occurring within the last 10 years: No If all of the above answers are "NO", then may proceed with Cephalosporin use.    Chocolate Itching   Ciprofloxacin Hives   Lamisil [Terbinafine] Hives   Wheat Bran Itching    Current Outpatient Medications on File Prior to Visit  Medication Sig Dispense Refill   albuterol (PROVENTIL) (2.5 MG/3ML) 0.083% nebulizer solution Take 3 mLs (2.5 mg total) by nebulization every 4 (four) hours as needed for wheezing or shortness of breath. 75 mL 0   albuterol (VENTOLIN HFA) 108 (90 Base) MCG/ACT inhaler Inhale 2 puffs into the lungs every 4 (four) hours as needed for wheezing. 1 Inhaler 3   ALPRAZolam (XANAX) 0.25 MG tablet Take 1 tablet (0.25 mg total) by mouth 2 (two) times daily as needed for anxiety. 60 tablet 1   celecoxib (CELEBREX) 100 MG capsule Take 1 capsule (100 mg total) by mouth 2 (two) times daily. 20 capsule 0   cetirizine (ZYRTEC) 10 MG tablet Take 10 mg by mouth daily.      clotrimazole-betamethasone (LOTRISONE) cream clotrimazole-betamethasone 1 %-0.05 % topical cream  APPLY TO THE AFFECTED AND SURROUNDING AREAS OF SKIN BY TOPICAL ROUTE QID prn     diphenhydrAMINE (BENADRYL) 25 MG tablet Take 25 mg by mouth as needed for allergies.      fluticasone (FLONASE) 50 MCG/ACT nasal spray Place 2 sprays into both nostrils daily. 16 g 6   phentermine 30 MG capsule Take 1 capsule (30 mg total) by mouth every morning. 30 capsule 1   No current facility-administered medications on file prior to visit.    BP 120/88   Pulse 81   Temp 98.6 F (37 C) (Oral)   Ht 5' 9"   (1.753 m)   Wt (!) 314 lb (142.4 kg)   SpO2 97%   BMI 46.37 kg/m       Objective:   Physical Exam Vitals and nursing note reviewed.  Constitutional:      Appearance: Normal appearance. She is obese.  Cardiovascular:     Rate and Rhythm: Normal rate and regular rhythm.     Pulses: Normal pulses.     Heart sounds: Normal heart sounds.  Pulmonary:  Effort: Pulmonary effort is normal.     Breath sounds: Normal breath sounds.  Skin:    General: Skin is warm and dry.  Neurological:     General: No focal deficit present.     Mental Status: She is alert and oriented to person, place, and time.  Psychiatric:        Mood and Affect: Mood normal.        Behavior: Behavior normal.        Thought Content: Thought content normal.        Judgment: Judgment normal.       Assessment & Plan:  1. Anxiety  - buPROPion (WELLBUTRIN XL) 150 MG 24 hr tablet; Take 1 tablet (150 mg total) by mouth daily.  Dispense: 90 tablet; Refill: 1  2. Pre-diabetes - If Darcel [place] is approved. Will have her stop phentermine  - tirzepatide (MOUNJARO) 2.5 MG/0.5ML Pen; Inject 2.5 mg into the skin once a week.  Dispense: 2 mL; Refill: 0    [name], NP

## 2021-01-28 ENCOUNTER — Encounter: Payer: Self-pay | Admitting: Adult Health

## 2021-01-29 NOTE — Telephone Encounter (Signed)
Pt aware of the update

## 2021-01-29 NOTE — Telephone Encounter (Signed)
Sydney Bradley [employer] - PA Case ID: [id number] - Rx #: [id number] Need help? Call us at [phone] Status Sent to [place] 2.5MG/0.5ML pen-injectors Form [employer] Electronic Prior Authorization Form (2017 NCPDP) Original Claim Info 44    PA has been started awaiting approval or denial

## 2021-01-30 ENCOUNTER — Telehealth: Payer: Self-pay | Admitting: Adult Health

## 2021-01-30 NOTE — Telephone Encounter (Signed)
Patient called because insurance will not cover tirzepatide [facility]) 2.5 MG/0.5ML Pen and they will not allow her to use the manufacturers discount card for cash. Patient would like a callback to discuss what to do.        Good callback number is [phone]    Please Advise

## 2021-02-03 ENCOUNTER — Other Ambulatory Visit: Payer: Self-pay | Admitting: Adult Health

## 2021-02-03 NOTE — Telephone Encounter (Signed)
Left message to return phone call.

## 2021-02-03 NOTE — Telephone Encounter (Signed)
Please advise 

## 2021-02-03 NOTE — Telephone Encounter (Signed)
Yes it was denied. Pt insurance stated it is only covered by them if the Rx is prescribed for type 2 DM

## 2021-02-06 ENCOUNTER — Other Ambulatory Visit: Payer: Self-pay | Admitting: Adult Health

## 2021-02-06 DIAGNOSIS — E668 Other obesity: Secondary | ICD-10-CM

## 2021-02-06 MED ORDER — PHENTERMINE HCL 30 MG PO CAPS: 30.0000 mg | ORAL_CAPSULE | ORAL | 1 refills | Status: DC

## 2021-02-06 NOTE — Telephone Encounter (Signed)
Spoke to pt and advised of message below. Pt stated that the phentermine is helping and curving her appetite. Pt stated is she needing to take the medication as prescribed until the other medication is FDA approved? Pt stated if so she will need a refill.   Ok to fill?

## 2021-02-06 NOTE — Telephone Encounter (Signed)
Pt also wanted Sydney Bradley to know that the xanax is working and her dad is becoming worse. Pt stated that Hospice will be involved soon.

## 2021-03-10 ENCOUNTER — Ambulatory Visit: Payer: 59 | Admitting: Family Medicine

## 2021-03-10 ENCOUNTER — Ambulatory Visit: Payer: 59 | Admitting: Adult Health

## 2021-03-10 VITALS — BP 128/84 | Temp 98.2°F | Wt 314.2 lb

## 2021-03-10 DIAGNOSIS — J4521 Mild intermittent asthma with (acute) exacerbation: Secondary | ICD-10-CM

## 2021-03-10 MED ORDER — METHYLPREDNISOLONE ACETATE 80 MG/ML IJ SUSP
80.0000 mg | Freq: Once | INTRAMUSCULAR | Status: AC
  Administered 2021-03-10: 80 mg via INTRAMUSCULAR

## 2021-03-10 MED ORDER — ALBUTEROL SULFATE (2.5 MG/3ML) 0.083% IN NEBU: 2.5000 mg | INHALATION_SOLUTION | RESPIRATORY_TRACT | 0 refills | Status: AC | PRN

## 2021-03-10 NOTE — Addendum Note (Signed)
Addended by: Anderson Malta on: 03/10/2021 04:58 PM   Modules accepted: Orders

## 2021-03-10 NOTE — Progress Notes (Signed)
Established Patient Office Visit  Subjective:  Patient ID: Sydney Bradley, female    DOB: 1980-12-03  Age: 40 y.o. MRN: [id number]  CC:  Chief Complaint  Patient presents with   Asthma    Patient had asthma attack on Sunday night     HPI Sydney Bradley presents for asthma exacerbation.  She has mild intermittent asthma and takes albuterol as needed.  She states her stepfather just died last night and she basically was around-the-clock taking care of him for several days.  He had 2 dogs and she thinks that may have triggered her asthma.  She has noticed some slight improvement since getting out of that house.  She has had some nasal congestion symptoms as well.  She does take some allergy medications.  She has required prednisone in the past for similar exacerbations.  Denies any fever.  She has albuterol MDI as well as nebulizer  Past Medical History:  Diagnosis Date   Arthritis    L5 Back - no meds - otc prn   Asthma    Crohn's disease of ileum (HCC)    Hypertension    no meds x 3 yrs   IBS (irritable bowel syndrome)    Morbid obesity with BMI of 45.0-49.9, adult ([place])    Sacral fracture ([place])     Past Surgical History:  Procedure Laterality Date   COLONOSCOPY     DILATION AND EVACUATION  02/25/2012   Procedure: DILATATION AND EVACUATION;  Surgeon: Daria Pastures, MD;  Location: [facility];  Service: Gynecology;  Laterality: N/A;   DILATION AND EVACUATION N/A 03/01/2013   Procedure: DILATATION AND EVACUATION;  Surgeon: Olga Millers, MD;  Location: [facility];  Service: Gynecology;  Laterality: N/A;   TONSILLECTOMY AND ADENOIDECTOMY  2007    Family History  Problem Relation Age of Onset   COPD Maternal Grandmother    Dementia Maternal Grandmother    Cancer Paternal Grandmother        blood cancer?   Stroke Paternal Grandmother    Alzheimer's disease Maternal Grandfather    Colon cancer Father        mets to liver and bone     Social History   Socioeconomic History    Marital status: Married    Spouse name: Not on file   Number of children: 1   Years of education: 18   Highest education level: Not on file  Occupational History   Occupation: Education officer, museum    Employer: 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 1 Inhaler 3   ALPRAZolam (XANAX) 0.25 MG tablet Take 1 tablet (0.25 mg total) by mouth 2 (two) times daily as needed for anxiety. 60 tablet 1   buPROPion (WELLBUTRIN XL) 150 MG 24 hr tablet Take 1 tablet (150 mg total) by mouth daily. 90 tablet 1   cetirizine (ZYRTEC) 10 MG tablet Take 10 mg by mouth daily.      clotrimazole-betamethasone (LOTRISONE) cream clotrimazole-betamethasone 1 %-0.05 % topical cream  APPLY TO THE AFFECTED AND SURROUNDING AREAS OF SKIN BY TOPICAL ROUTE QID prn     diphenhydrAMINE (BENADRYL) 25 MG tablet Take 25 mg by mouth as needed for allergies.      fluticasone (FLONASE) 50 MCG/ACT nasal spray Place 2 sprays into both nostrils daily. 16 g 6   phentermine 30 MG  capsule Take 1 capsule (30 mg total) by mouth every morning. 30 capsule 1   albuterol (PROVENTIL) (2.5 MG/3ML) 0.083% nebulizer solution Take 3 mLs (2.5 mg total) by nebulization every 4 (four) hours as needed for wheezing or shortness of breath. 75 mL 0   celecoxib (CELEBREX) 100 MG capsule Take 1 capsule (100 mg total) by mouth 2 (two) times daily. (Patient not taking: Reported on 03/10/2021) 20 capsule 0   No facility-administered medications prior to visit.    Allergies  Allergen Reactions   Latex Hives    Breathing problems   Other Anaphylaxis and Itching    Nuts Tree nuts   Peanut-Containing Drug Products Anaphylaxis    Allergy to all nuts    Shellfish Allergy Anaphylaxis   Ciprofibrate Hives   Penicillins Hives    Has patient had a PCN reaction causing immediate rash, facial/tongue/throat swelling, SOB or lightheadedness with hypotension: Yes Has patient had a PCN reaction causing severe rash involving mucus membranes or skin necrosis: No Has patient had a PCN reaction that required hospitalization No Has patient had a PCN reaction occurring within the last 10 years: No If all of the above answers are "NO", then may proceed with Cephalosporin use.    Chocolate Itching   Ciprofloxacin Hives   Lamisil [Terbinafine] Hives   Wheat Bran Itching    ROS Review of Systems  Constitutional:  Negative for chills and fever.  Respiratory:  Positive for cough, shortness of breath and wheezing.   Cardiovascular:  Negative for chest pain and leg swelling.     Objective:    Physical Exam Vitals reviewed.  Cardiovascular:     Rate and Rhythm: Normal rate and regular rhythm.  Pulmonary:     Effort: Pulmonary effort is normal.     Comments: Few faint wheezes.  No rales.  No respiratory distress.  No retractions.  Normal respiratory rate. Neurological:     Mental Status: She is alert.    BP 128/84 (BP Location: Left Arm, Patient Position: Sitting, Cuff Size: Normal)   Temp 98.2  F (36.8 C) (Oral)   Wt (!) 314 lb 3.2 oz (142.5 kg)   BMI 46.40 kg/m  Wt Readings from Last 3 Encounters:  03/10/21 (!) 314 lb 3.2 oz (142.5 kg)  01/23/21 (!) 314 lb (142.4 kg)  12/26/20 (!) 317 lb (143.8 kg)     Health Maintenance Due  Topic Date Due   Pneumococcal Vaccine 53-16 Years old (1 - PCV) Never done   Hepatitis C Screening  Never done   COVID-19 Vaccine (3 -  Mixed Product risk series) 06/23/2019   INFLUENZA VACCINE  Never done    There are no preventive care reminders to display for this patient.  Lab Results  Component Value Date   TSH 1.34 08/14/2019   Lab Results  Component Value Date   WBC 8.6 08/14/2019   HGB 13.1 08/14/2019   HCT 40.4 08/14/2019   MCV 83.3 08/14/2019   PLT 262.0 08/14/2019   Lab Results  Component Value Date   NA 138 08/14/2019   K 4.3 08/14/2019   CO2 26 08/14/2019   GLUCOSE 82 08/14/2019   BUN 8 08/14/2019   CREATININE 0.80 08/14/2019   BILITOT 0.6 08/14/2019   ALKPHOS 47 08/14/2019   AST 13 08/14/2019   ALT 15 08/14/2019   PROT 7.2 08/14/2019   ALBUMIN 4.0 08/14/2019   CALCIUM 9.3 08/14/2019   ANIONGAP 4 (L) 10/12/2014   GFR 96.01 08/14/2019   Lab Results  Component Value Date   CHOL 156 08/14/2019   Lab Results  Component Value Date   HDL 49.00 08/14/2019   Lab Results  Component Value Date   LDLCALC 86 08/14/2019   Lab Results  Component Value Date   TRIG 104.0 08/14/2019   Lab Results  Component Value Date   CHOLHDL 3 08/14/2019   Lab Results  Component Value Date   HGBA1C 5.6 08/14/2019      Assessment & Plan:   Problem List Items Addressed This Visit   None Visit Diagnoses     Exacerbation of intermittent asthma, unspecified asthma severity    -  Primary   Relevant Medications   albuterol (PROVENTIL) (2.5 MG/3ML) 0.083% nebulizer solution     Asthma likely exacerbated by recent exposure to dogs.  She is out of that environment at this time which should help somewhat.  We refilled her  albuterol solution for nebulizer.  Patient request IM prednisone which is worked well for her in the past.  Depo-Medrol 80 mg IM given.  Follow-up for any persistent or worsening symptoms  Meds ordered this encounter  Medications   albuterol (PROVENTIL) (2.5 MG/3ML) 0.083% nebulizer solution    Sig: Take 3 mLs (2.5 mg total) by nebulization every 4 (four) hours as needed for wheezing or shortness of breath.    Dispense:  75 mL    Refill:  0    Follow-up: No follow-ups on file.    Carolann Littler, MD

## 2021-03-15 ENCOUNTER — Encounter: Payer: Self-pay | Admitting: Adult Health

## 2021-03-17 ENCOUNTER — Encounter: Payer: Self-pay | Admitting: Internal Medicine

## 2021-03-17 ENCOUNTER — Telehealth: Payer: Self-pay | Admitting: Internal Medicine

## 2021-03-17 NOTE — Telephone Encounter (Signed)
Patient called and states that she is wanting to know how often she needs to have her colonoscopy done. It looks like her last was done in 2018. States her father passed away last week from colon cancer. Please advise.

## 2021-03-17 NOTE — Telephone Encounter (Signed)
Pt made aware of Dr. Carlean Purl recommendations. Pt scheduled for an office visit with Dr. Carlean Purl on 05/01/2021 @ 9:30  Pt verbalized understanding with all questions answered.

## 2021-03-17 NOTE — Telephone Encounter (Signed)
Pt states that her father recently passed away from an Aggressive Colon Cancer that had mets. Pt last colon was 10/04/2016 with Dr. Carlean Purl in the the [facility]. Pt does have a Hx of Crohn's disease but is been managed by diet. Pt is questioning when she should have her next Colonoscopy. Please advise

## 2021-03-17 NOTE — Telephone Encounter (Signed)
That would depend upon age of father and her Crohn's issues  Quite likely appropriate to do one around this time but she needs an office visit w/ me or an APP to sort out her situation before any decision is made  I do not think this is urgent

## 2021-05-01 ENCOUNTER — Ambulatory Visit: Payer: 59 | Admitting: Internal Medicine

## 2021-05-01 ENCOUNTER — Encounter: Payer: Self-pay | Admitting: Internal Medicine

## 2021-05-01 ENCOUNTER — Other Ambulatory Visit (INDEPENDENT_AMBULATORY_CARE_PROVIDER_SITE_OTHER): Payer: 59

## 2021-05-01 VITALS — BP 118/70 | Ht 69.5 in | Wt 306.0 lb

## 2021-05-01 DIAGNOSIS — Z800 Family history of malignant neoplasm of digestive organs: Secondary | ICD-10-CM | POA: Diagnosis not present

## 2021-05-01 DIAGNOSIS — K5000 Crohn's disease of small intestine without complications: Secondary | ICD-10-CM | POA: Diagnosis not present

## 2021-05-01 LAB — CBC WITH DIFFERENTIAL/PLATELET
Basophils Absolute: 0 10*3/uL (ref 0.0–0.1)
Basophils Relative: 0.5 % (ref 0.0–3.0)
Eosinophils Absolute: 0 10*3/uL (ref 0.0–0.7)
Eosinophils Relative: 0.8 % (ref 0.0–5.0)
HCT: 40.7 % (ref 36.0–46.0)
Hemoglobin: 13.2 g/dL (ref 12.0–15.0)
Lymphocytes Relative: 35.4 % (ref 12.0–46.0)
Lymphs Abs: 2.1 10*3/uL (ref 0.7–4.0)
MCHC: 32.4 g/dL (ref 30.0–36.0)
MCV: 81.3 fl (ref 78.0–100.0)
Monocytes Absolute: 0.6 10*3/uL (ref 0.1–1.0)
Monocytes Relative: 9.8 % (ref 3.0–12.0)
Neutro Abs: 3.1 10*3/uL (ref 1.4–7.7)
Neutrophils Relative %: 53.5 % (ref 43.0–77.0)
Platelets: 248 10*3/uL (ref 150.0–400.0)
RBC: 5 Mil/uL (ref 3.87–5.11)
RDW: 12.9 % (ref 11.5–15.5)
WBC: 5.8 10*3/uL (ref 4.0–10.5)

## 2021-05-01 LAB — COMPREHENSIVE METABOLIC PANEL
ALT: 11 U/L (ref 0–35)
AST: 14 U/L (ref 0–37)
Albumin: 4.1 g/dL (ref 3.5–5.2)
Alkaline Phosphatase: 67 U/L (ref 39–117)
BUN: 8 mg/dL (ref 6–23)
CO2: 25 mEq/L (ref 19–32)
Calcium: 9.3 mg/dL (ref 8.4–10.5)
Chloride: 103 mEq/L (ref 96–112)
Creatinine, Ser: 1.02 mg/dL (ref 0.40–1.20)
GFR: 68.12 mL/min (ref >60.00)
Glucose, Bld: 89 mg/dL (ref 70–99)
Potassium: 4.4 mEq/L (ref 3.5–5.1)
Sodium: 135 mEq/L (ref 135–145)
Total Bilirubin: 0.7 mg/dL (ref 0.2–1.2)
Total Protein: 7.7 g/dL (ref 6.0–8.3)

## 2021-05-01 NOTE — Patient Instructions (Signed)
You have been scheduled for a colonoscopy. Please follow written instructions given to you at your visit today.  Please pick up your prep supplies at the pharmacy within the next 1-3 days. If you use inhalers (even only as needed), please bring them with you on the day of your procedure.  Your provider has requested that you go to the basement level for lab work before leaving today. Press "B" on the elevator. The lab is located at the first door on the left as you exit the elevator.  Due to recent changes in healthcare laws, you may see the results of your imaging and laboratory studies on MyChart before your provider has had a chance to review them.  We understand that in some cases there may be results that are confusing or concerning to you. Not all laboratory results come back in the same time frame and the provider may be waiting for multiple results in order to interpret others.  Please give [place] 48 hours in order for your provider to thoroughly review all the results before contacting the office for clarification of your results.   I appreciate the opportunity to care for you. Silvano Rusk, MD, [facility]

## 2021-05-01 NOTE — Progress Notes (Signed)
Sydney Bradley 40 y.o. 1981-04-02 [id number]  Assessment & Plan:   Encounter Diagnoses  Name Primary?   Crohn's disease of ileum without complication ([place]) Yes   Family history of colon cancer in father-dx 57     Schedule colonoscopy to follow-up suspected Crohn's disease and because of the family history of colon cancer.  She will hold her phentermine prior to this given the potential rare interaction with propofol.  The risks and benefits as well as alternatives of endoscopic procedure(s) have been discussed and reviewed. All questions answered. The patient agrees to proceed.  Orders Placed This Encounter  Procedures   CBC with Differential/Platelet   Comprehensive metabolic panel   Ambulatory referral to Gastroenterology    CC: [name], NP    Subjective:   Chief Complaint: Follow-up of Crohn's ileitis and family history of colon cancer schedule colonoscopy  HPI 41 year old African-American woman with a history of Crohn's ileitis previously treated with Cimzia, she has not taken it since 2019.  That is when I last saw her.  At that time she was wondering whether she might be pregnant though she was not, she had stopped the Cimzia and she has remained off it and says she has managed her Crohn's disease with diet.  She finds that she does not have significant diarrhea as long as she avoids processed foods.  If she indulges in processed foods or those with refined sugar even homemade things such as cakes or fried or greasy foods she will have problems.  She has been using phentermine for weight loss.  She is back on that and having constipation as she has had in the past.  As far as sugary beverages there is occasional ginger ale no Gatorade no fruit juices no other sodas.  Crohn's diagnosis made in 2018 with multiple small ulcers in the terminal ileum.  No granulomas on biopsy.  She responded to Entocort and was transition to Cimzia. Allergies  Allergen Reactions   Latex  Hives    Breathing problems   Other Anaphylaxis and Itching    Nuts Tree nuts   Peanut-Containing Drug Products Anaphylaxis    Allergy to all nuts    Shellfish Allergy Anaphylaxis   Ciprofibrate Hives   Penicillins Hives    Has patient had a PCN reaction causing immediate rash, facial/tongue/throat swelling, SOB or lightheadedness with hypotension: Yes Has patient had a PCN reaction causing severe rash involving mucus membranes or skin necrosis: No Has patient had a PCN reaction that required hospitalization No Has patient had a PCN reaction occurring within the last 10 years: No If all of the above answers are "NO", then may proceed with Cephalosporin use.    Chocolate Itching   Ciprofloxacin Hives   Lamisil [Terbinafine] Hives   Wheat Bran Itching   Current Meds  Medication Sig   albuterol (PROVENTIL) (2.5 MG/3ML) 0.083% nebulizer solution Take 3 mLs (2.5 mg total) by nebulization every 4 (four) hours as needed for wheezing or shortness of breath.   albuterol (VENTOLIN HFA) 108 (90 Base) MCG/ACT inhaler Inhale 2 puffs into the lungs every 4 (four) hours as needed for wheezing.   ALPRAZolam (XANAX) 0.25 MG tablet Take 1 tablet (0.25 mg total) by mouth 2 (two) times daily as needed for anxiety.   buPROPion (WELLBUTRIN XL) 150 MG 24 hr tablet Take 1 tablet (150 mg total) by mouth daily.   celecoxib (CELEBREX) 100 MG capsule Take 1 capsule (100 mg total) by mouth 2 (two) times daily.  cetirizine (ZYRTEC) 10 MG tablet Take 10 mg by mouth daily.    clotrimazole-betamethasone (LOTRISONE) cream clotrimazole-betamethasone 1 %-0.05 % topical cream  APPLY TO THE AFFECTED AND SURROUNDING AREAS OF SKIN BY TOPICAL ROUTE QID prn   diphenhydrAMINE (BENADRYL) 25 MG tablet Take 25 mg by mouth as needed for allergies.    fluticasone (FLONASE) 50 MCG/ACT nasal spray Place 2 sprays into both nostrils daily.   hydroquinone 4 % cream Apply 1 Dose topically daily.   phentermine 30 MG capsule Take 1  capsule (30 mg total) by mouth every morning.   Past Medical History:  Diagnosis Date   Arthritis    L5 Back - no meds - otc prn   Asthma    Crohn's disease of ileum ([place])    Hypertension    no meds x 3 yrs   IBS (irritable bowel syndrome)    Morbid obesity with BMI of 45.0-49.9, adult ([place])    Sacral fracture ([place])    Past Surgical History:  Procedure Laterality Date   COLONOSCOPY     DILATION AND EVACUATION  02/25/2012   Procedure: DILATATION AND EVACUATION;  Surgeon: Daria Pastures, MD;  Location: [facility];  Service: Gynecology;  Laterality: N/A;   DILATION AND EVACUATION N/A 03/01/2013   Procedure: DILATATION AND EVACUATION;  Surgeon: Olga Millers, MD;  Location: [facility];  Service: Gynecology;  Laterality: N/A;   TONSILLECTOMY AND ADENOIDECTOMY  2007   Social History   Social History Narrative   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   Review of Systems As per HPI  Objective:   Physical Exam BP 118/70    Ht 5' 9.5" (1.765 m)    Wt (!) 306 lb (138.8 kg)    BMI 44.54 kg/m  Obese pleasant black woman in no acute distress Lungs clear Heart sounds normal Abdomen is soft and nontender without organomegaly or mass Rectal exam is deferred until colonoscopy

## 2021-05-05 ENCOUNTER — Encounter: Payer: Self-pay | Admitting: Internal Medicine

## 2021-05-06 ENCOUNTER — Other Ambulatory Visit: Payer: Self-pay | Admitting: Adult Health

## 2021-05-06 DIAGNOSIS — E668 Other obesity: Secondary | ICD-10-CM

## 2021-05-07 NOTE — Telephone Encounter (Signed)
Lm asking the patient to schedule a follow up. Please deny this to remove it from the basket.

## 2021-05-07 NOTE — Telephone Encounter (Signed)
Last filled 02/06/2021 Last OV 03/10/2021  Ok to fill?

## 2021-05-14 ENCOUNTER — Other Ambulatory Visit: Payer: Self-pay | Admitting: Adult Health

## 2021-05-14 ENCOUNTER — Telehealth: Payer: Self-pay

## 2021-05-14 DIAGNOSIS — E668 Other obesity: Secondary | ICD-10-CM

## 2021-05-15 ENCOUNTER — Ambulatory Visit: Payer: 59 | Admitting: Adult Health

## 2021-05-15 ENCOUNTER — Encounter: Payer: Self-pay | Admitting: Adult Health

## 2021-05-15 VITALS — BP 128/86 | Temp 97.9°F | Ht 69.5 in | Wt 305.0 lb

## 2021-05-15 DIAGNOSIS — F419 Anxiety disorder, unspecified: Secondary | ICD-10-CM | POA: Diagnosis not present

## 2021-05-15 DIAGNOSIS — R519 Headache, unspecified: Secondary | ICD-10-CM

## 2021-05-15 DIAGNOSIS — E668 Other obesity: Secondary | ICD-10-CM | POA: Diagnosis not present

## 2021-05-15 DIAGNOSIS — Z6841 Body Mass Index (BMI) 40.0 and over, adult: Secondary | ICD-10-CM | POA: Diagnosis not present

## 2021-05-15 MED ORDER — PHENTERMINE HCL 30 MG PO CAPS: 30.0000 mg | ORAL_CAPSULE | ORAL | 2 refills | Status: DC

## 2021-05-15 MED ORDER — WEGOVY 0.25 MG/0.5ML ~~LOC~~ SOAJ: 0.2500 mg | SUBCUTANEOUS | 0 refills | Status: DC

## 2021-05-15 MED ORDER — KETOROLAC TROMETHAMINE 60 MG/2ML IM SOLN
60.0000 mg | Freq: Once | INTRAMUSCULAR | Status: AC
  Administered 2021-05-15: 60 mg via INTRAMUSCULAR

## 2021-05-15 NOTE — Patient Instructions (Signed)
Health Maintenance Due  Topic Date Due   Hepatitis C Screening  Never done   COVID-19 Vaccine (3 - Mixed Product risk series) 06/23/2019   INFLUENZA VACCINE  Never done    Depression screen [facility] 2/9 02/15/2020 08/04/2015 06/28/2014  Decreased Interest 0 0 0  Down, Depressed, Hopeless 0 0 0  PHQ - 2 Score 0 0 0

## 2021-05-15 NOTE — Progress Notes (Signed)
Subjective:    Patient ID: Sydney Bradley, female    DOB: 02/23/1981, 41 y.o.   MRN: [id number]  HPI 41 year old female who  has a past medical history of Arthritis, Asthma, Crohn's disease of ileum ([place]), Hypertension, IBS (irritable bowel syndrome), Morbid obesity with BMI of 45.0-49.9, adult ([place]), and Sacral fracture ([place]).  She presents to the office today for follow-up guarding anxiety.  Weight loss management.  Anxiety.  Very well controlled with Wellbutrin 150 mg extended release. Will take a single xanax 0.25 mg PRN.   Obesity/weight loss management- Currently taking Phentermine 30 mg. She continues to lose some weight with the phentermine. She is watching her diet, cutting back on portion sizes,  She is also walking more often.   Wt Readings from Last 10 Encounters:  05/15/21 (!) 305 lb (138.3 kg)  05/01/21 (!) 306 lb (138.8 kg)  03/10/21 (!) 314 lb 3.2 oz (142.5 kg)  01/23/21 (!) 314 lb (142.4 kg)  12/26/20 (!) 317 lb (143.8 kg)  10/15/20 (!) 320 lb (145.2 kg)  09/16/20 (!) 324 lb (147 kg)  08/15/20 (!) 327 lb 9.6 oz (148.6 kg)  04/01/20 (!) 328 lb (148.8 kg)  02/15/20 (!) 328 lb 6.4 oz (149 kg)    Also reports an intermittent frontal headache over the last 2-3 days. No photo or phono sensitivity, nausea, or vomiting. Has tried Nsaids at home without relief.   Review of Systems See HPI   Past Medical History:  Diagnosis Date   Arthritis    L5 Back - no meds - otc prn   Asthma    Crohn's disease of ileum (HCC)    Hypertension    no meds x 3 yrs   IBS (irritable bowel syndrome)    Morbid obesity with BMI of 45.0-49.9, adult (HCC)    Sacral fracture (HCC)     Social History   Socioeconomic History   Marital status: Married    Spouse name: Not on file   Number of children: 1   Years of education: 18   Highest education level: Not on file  Occupational History   Occupation: Education officer, museum    Employer: 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[place], foster care   Married    One biologic son Cornelia Copa) born 2016 and a stepson   One caffeinated beverage daily   Social Determinants of Health   Financial Resource Strain: Not on file  Food Insecurity: Not on file  Transportation Needs: Not on file  Physical Activity: Not on file  Stress: Not on file  Social Connections: Not on file  Intimate Partner Violence: Not on file    Past Surgical History:  Procedure Laterality Date   COLONOSCOPY     DILATION AND EVACUATION  02/25/2012   Procedure: [place];  Surgeon: Daria Pastures, MD;  Location: [facility];  Service: Gynecology;  Laterality: N/A;   DILATION AND EVACUATION N/A 03/01/2013   Procedure: DILATATION AND EVACUATION;  Surgeon: Olga Millers, MD;  Location: [facility];  Service: Gynecology;  Laterality: N/A;   TONSILLECTOMY AND ADENOIDECTOMY  2007    Family History  Problem Relation Age of Onset   Colon cancer Father 72       mets to liver and bone died at 46   COPD Maternal Grandmother    Dementia Maternal Grandmother    Alzheimer's disease Maternal Grandfather    Cancer Paternal Grandmother        blood cancer?   Stroke Paternal Grandmother     Allergies  Allergen Reactions   Latex Hives    Breathing problems   Other Anaphylaxis and Itching    Nuts Tree nuts   Peanut-Containing Drug Products Anaphylaxis    Allergy to all nuts    Shellfish Allergy Anaphylaxis   Ciprofibrate Hives   Penicillins Hives    Has patient had a PCN reaction causing immediate rash, facial/tongue/throat swelling, SOB or lightheadedness with hypotension:  Yes Has patient had a PCN reaction causing severe rash involving mucus membranes or skin necrosis: No Has patient had a PCN reaction that required hospitalization No Has patient had a PCN reaction occurring within the last 10 years: No If all of the above answers are "NO", then may proceed with Cephalosporin use.    Chocolate Itching   Ciprofloxacin Hives   Lamisil [Terbinafine] Hives   Wheat Bran Itching    Current Outpatient Medications on File Prior to Visit  Medication Sig Dispense Refill   albuterol (PROVENTIL) (2.5 MG/3ML) 0.083% nebulizer solution Take 3 mLs (2.5 mg total) by nebulization every 4 (four) hours as needed for wheezing or shortness of breath. 75 mL 0   albuterol (VENTOLIN HFA) 108 (90 Base) MCG/ACT inhaler Inhale 2 puffs into the lungs every 4 (four) hours as needed for wheezing. 1 Inhaler 3   ALPRAZolam (XANAX) 0.25 MG tablet Take 1 tablet (0.25 mg total) by mouth 2 (two) times daily as needed for anxiety. 60 tablet 1   buPROPion (WELLBUTRIN XL) 150 MG 24 hr tablet Take 1 tablet (150 mg total) by mouth daily. 90 tablet 1   celecoxib (CELEBREX) 100 MG capsule Take 1 capsule (100 mg total) by mouth 2 (two) times daily. 20 capsule 0   cetirizine (ZYRTEC) 10 MG tablet Take 10 mg by mouth daily.      Clindamycin-Benzoyl Per, Refr, gel Apply topically.     clotrimazole-betamethasone (LOTRISONE) cream clotrimazole-betamethasone 1 %-0.05 % topical cream  APPLY TO THE AFFECTED AND SURROUNDING AREAS OF SKIN BY TOPICAL ROUTE QID prn     diphenhydrAMINE (BENADRYL) 25 MG tablet Take 25 mg by mouth as needed for allergies.      ergocalciferol (VITAMIN D2) 1.25 MG (50000 UT) capsule Take by mouth.     fluticasone (FLONASE) 50 MCG/ACT nasal spray Place 2 sprays into both nostrils daily. 16 g 6   hydroquinone 4 % cream Apply 1 Dose topically daily.     phentermine 30 MG capsule Take 1 capsule (30 mg total) by mouth every morning. 30 capsule 1   sulfamethoxazole-trimethoprim (BACTRIM  DS) 800-160 MG tablet Take 1 tablet by mouth 2 (two) times daily.     No current facility-administered medications on file prior to visit.    BP 130/80    Temp 97.9 F (36.6 C) (Oral)    Ht 5' 9.5" (1.765 m)    Wt (!) 305 lb (138.3 kg)    BMI 44.39 kg/m       Objective:   Physical Exam Vitals and nursing note reviewed.  Constitutional:  Appearance: Normal appearance. She is obese.  Cardiovascular:     Rate and Rhythm: Normal rate and regular rhythm.     Pulses: Normal pulses.     Heart sounds: Normal heart sounds.  Pulmonary:     Effort: Pulmonary effort is normal.     Breath sounds: Normal breath sounds.  Neurological:     General: No focal deficit present.     Mental Status: She is alert and oriented to person, place, and time.  Psychiatric:        Mood and Affect: Mood normal.        Behavior: Behavior normal.        Thought Content: Thought content normal.        Judgment: Judgment normal.      Assessment & Plan:  1. BMI 45.0-49.9, adult [facility]) Will try and get [facility] approved for her. If not then can get phentermine  - Continue lifestyle modifications  - Follow up in 3 months - Semaglutide-Weight Management (WEGOVY) 0.25 MG/0.5ML SOAJ; Inject 0.25 mg into the skin once a week.  Dispense: 2 mL; Refill: 0 - phentermine 30 MG capsule; Take 1 capsule (30 mg total) by mouth every morning.  Dispense: 30 capsule; Refill: 2  2. Other obesity   3. Anxiety - Continue wellbutrin   4. Acute intractable headache, unspecified headache type  - ketorolac (TORADOL) injection 60 mg  [name], NP

## 2021-05-22 ENCOUNTER — Telehealth: Payer: Self-pay | Admitting: Adult Health

## 2021-05-22 NOTE — Telephone Encounter (Signed)
Pt notified that PA was denied but an appeal was sent in.

## 2021-05-22 NOTE — Telephone Encounter (Signed)
Pt is calling and needs PA [facility] # [id number] [place] Phone:  [phone]  Fax:  [phone]

## 2021-06-18 ENCOUNTER — Encounter: Payer: Self-pay | Admitting: Internal Medicine

## 2021-06-22 ENCOUNTER — Encounter: Payer: Self-pay | Admitting: Certified Registered Nurse Anesthetist

## 2021-06-23 ENCOUNTER — Ambulatory Visit (AMBULATORY_SURGERY_CENTER): Payer: 59 | Admitting: Internal Medicine

## 2021-06-23 ENCOUNTER — Other Ambulatory Visit: Payer: Self-pay

## 2021-06-23 ENCOUNTER — Encounter: Payer: Self-pay | Admitting: Internal Medicine

## 2021-06-23 VITALS — BP 110/62 | HR 70 | Temp 98.7°F | Resp 13 | Ht 69.0 in | Wt 306.0 lb

## 2021-06-23 DIAGNOSIS — Z800 Family history of malignant neoplasm of digestive organs: Secondary | ICD-10-CM | POA: Diagnosis not present

## 2021-06-23 DIAGNOSIS — K5000 Crohn's disease of small intestine without complications: Secondary | ICD-10-CM

## 2021-06-23 MED ORDER — NYSTATIN-TRIAMCINOLONE 100000-0.1 UNIT/GM-% EX OINT
1.0000 | TOPICAL_OINTMENT | Freq: Two times a day (BID) | CUTANEOUS | 0 refills | Status: DC
Start: 2021-06-23 — End: 2021-06-23

## 2021-06-23 MED ORDER — SODIUM CHLORIDE 0.9 % IV SOLN: 500.0000 mL | Freq: Once | INTRAVENOUS | Status: DC

## 2021-06-23 NOTE — Progress Notes (Signed)
[facility] History and Physical ? ? ?Primary Care Physician:  [name], NP ? ? ?Reason for Procedure:   FHx CRCA and personal hx Crohn's ileitis ? ?Plan:    colonoscopy ? ? ? ? ?HPI: Sydney Bradley is a 41 y.o. female here for colonoscopy w/ hx suspected Crohn's ileitius and FHx CRCA father (53) ? ? ?Past Medical History:  ?Diagnosis Date  ? Arthritis   ? L5 Back - no meds - otc prn  ? Asthma   ? Crohn's disease of ileum ([place])   ? Hypertension   ? no meds x 3 yrs  ? IBS (irritable bowel syndrome)   ? Morbid obesity with BMI of 45.0-49.9, adult ([place])   ? Sacral fracture ([place])   ? ? ?Past Surgical History:  ?Procedure Laterality Date  ? COLONOSCOPY    ? DILATION AND EVACUATION  02/25/2012  ? Procedure: DILATATION AND EVACUATION;  Surgeon: Daria Pastures, MD;  Location: [facility];  Service: Gynecology;  Laterality: N/A;  ? DILATION AND EVACUATION N/A 03/01/2013  ? Procedure: DILATATION AND EVACUATION;  Surgeon: Olga Millers, MD;  Location: [facility];  Service: Gynecology;  Laterality: N/A;  ? TONSILLECTOMY AND ADENOIDECTOMY  2007  ? ? ?Prior to Admission medications   ?Medication Sig Start Date End Date Taking? Authorizing Provider  ?buPROPion (WELLBUTRIN XL) 150 MG 24 hr tablet Take 1 tablet (150 mg total) by mouth daily. 01/23/21  Yes Nafziger, Tommi Rumps, NP  ?cetirizine (ZYRTEC) 10 MG tablet Take 10 mg by mouth daily.    Yes [provider]  ?clotrimazole-betamethasone (LOTRISONE) cream clotrimazole-betamethasone 1 %-0.05 % topical cream ? APPLY TO THE AFFECTED AND SURROUNDING AREAS OF SKIN BY TOPICAL ROUTE QID prn   Yes [provider]  ?diphenhydrAMINE (BENADRYL) 25 MG tablet Take 25 mg by mouth as needed for allergies.    Yes [provider]  ?ergocalciferol (VITAMIN D2) 1.25 MG (50000 UT) capsule Take by mouth. 12/05/18  Yes [provider]  ?hydroquinone 4 % cream Apply 1 Dose topically daily.   Yes [provider]  ?albuterol (PROVENTIL) (2.5 MG/3ML)  0.083% nebulizer solution Take 3 mLs (2.5 mg total) by nebulization every 4 (four) hours as needed for wheezing or shortness of breath. 03/10/21   Burchette, Alinda Sierras, MD  ?albuterol (VENTOLIN HFA) 108 (90 Base) MCG/ACT inhaler Inhale 2 puffs into the lungs every 4 (four) hours as needed for wheezing. 08/03/18   [name], NP  ?ALPRAZolam (XANAX) 0.25 MG tablet Take 1 tablet (0.25 mg total) by mouth 2 (two) times daily as needed for anxiety. 12/26/20   Nafziger, Tommi Rumps, NP  ?celecoxib (CELEBREX) 100 MG capsule Take 1 capsule (100 mg total) by mouth 2 (two) times daily. 08/15/20   [name], NP  ?Clindamycin-Benzoyl Per, Refr, gel Apply topically. 04/10/21   [provider]  ?fluticasone (FLONASE) 50 MCG/ACT nasal spray Place 2 sprays into both nostrils daily. 01/11/18   Nafziger, Tommi Rumps, NP  ?phentermine 30 MG capsule Take 1 capsule (30 mg total) by mouth every morning. ?Patient not taking: Reported on 06/23/2021 05/15/21   [name], NP  ?Semaglutide-Weight Management (WEGOVY) 0.25 MG/0.5ML SOAJ Inject 0.25 mg into the skin once a week. 05/15/21   [name], NP  ? ? ?Current Outpatient Medications  ?Medication Sig Dispense Refill  ? buPROPion (WELLBUTRIN XL) 150 MG 24 hr tablet Take 1 tablet (150 mg total) by mouth daily. 90 tablet 1  ? cetirizine (ZYRTEC) 10 MG tablet Take 10 mg by mouth  daily.     ? clotrimazole-betamethasone (LOTRISONE) cream clotrimazole-betamethasone 1 %-0.05 % topical cream ? APPLY TO THE AFFECTED AND SURROUNDING AREAS OF SKIN BY TOPICAL ROUTE QID prn    ? diphenhydrAMINE (BENADRYL) 25 MG tablet Take 25 mg by mouth as needed for allergies.     ? ergocalciferol (VITAMIN D2) 1.25 MG (50000 UT) capsule Take by mouth.    ? hydroquinone 4 % cream Apply 1 Dose topically daily.    ? albuterol (PROVENTIL) (2.5 MG/3ML) 0.083% nebulizer solution Take 3 mLs (2.5 mg total) by nebulization every 4 (four) hours as needed for wheezing or shortness of breath. 75 mL 0  ? albuterol (VENTOLIN HFA)  108 (90 Base) MCG/ACT inhaler Inhale 2 puffs into the lungs every 4 (four) hours as needed for wheezing. 1 Inhaler 3  ? ALPRAZolam (XANAX) 0.25 MG tablet Take 1 tablet (0.25 mg total) by mouth 2 (two) times daily as needed for anxiety. 60 tablet 1  ? celecoxib (CELEBREX) 100 MG capsule Take 1 capsule (100 mg total) by mouth 2 (two) times daily. 20 capsule 0  ? Clindamycin-Benzoyl Per, Refr, gel Apply topically.    ? fluticasone (FLONASE) 50 MCG/ACT nasal spray Place 2 sprays into both nostrils daily. 16 g 6  ? phentermine 30 MG capsule Take 1 capsule (30 mg total) by mouth every morning. (Patient not taking: Reported on 06/23/2021) 30 capsule 2  ? Semaglutide-Weight Management (WEGOVY) 0.25 MG/0.5ML SOAJ Inject 0.25 mg into the skin once a week. 2 mL 0  ? ?Current Facility-Administered Medications  ?Medication Dose Route Frequency Provider Last Rate Last Admin  ? 0.9 %  sodium chloride infusion  500 mL Intravenous Once Gatha Mayer, MD      ? ? ?Allergies as of 06/23/2021 - Review Complete 06/23/2021  ?Allergen Reaction Noted  ? Latex Hives 02/24/2012  ? Other Anaphylaxis and Itching 02/24/2012  ? Peanut-containing drug products Anaphylaxis 11/04/2012  ? Shellfish allergy Anaphylaxis 02/24/2012  ? Ciprofibrate Hives 01/23/2019  ? Penicillins Hives 04/30/2011  ? Chocolate Itching 02/24/2012  ? Ciprofloxacin Hives 04/30/2011  ? Lamisil [terbinafine] Hives 11/04/2012  ? Wheat bran Itching 02/24/2012  ? ? ?Family History  ?Problem Relation Age of Onset  ? Colon cancer Father 78  ?     mets to liver and bone died at 6  ? COPD Maternal Grandmother   ? Dementia Maternal Grandmother   ? Alzheimer's disease Maternal Grandfather   ? Cancer Paternal Grandmother   ?     blood cancer?  ? Stroke Paternal Grandmother   ? ? ?Social History  ? ?Socioeconomic History  ? Marital status: Married  ?  Spouse name: Not on file  ? Number of children: 1  ? Years of education: 7  ? Highest education level: Not on file  ?Occupational  History  ? Occupation: Education officer, museum  ?  Employer: [employer]  ?Tobacco Use  ? Smoking status: Never  ? Smokeless tobacco: Never  ?Vaping Use  ? Vaping Use: Never used  ?Substance and Sexual Activity  ? Alcohol use: No  ?  Alcohol/week: 0.0 standard drinks  ? Drug use: No  ? Sexual activity: Yes  ?  Birth control/protection: None  ?Other Topics Concern  ? Not on file  ?Social History Narrative  ? 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 Married   ? One biologic son Cornelia Copa) born 2016 and a stepson  ? One caffeinated  beverage daily  ? ?Social Determinants of Health  ? ?Financial Resource Strain: Not on file  ?Food Insecurity: Not on file  ?Transportation Needs: Not on file  ?Physical Activity: Not on file  ?Stress: Not on file  ?Social Connections: Not on file  ?Intimate Partner Violence: Not on file  ? ? ?Review of Systems: ? ?All other review of systems negative except as mentioned in the HPI. ? ?Physical Exam: ?Vital signs ?BP 130/78   Pulse 75   Temp 98.7 ?F (37.1 ?C)   Resp 17   Ht 5' 9"  (1.753 m)   Wt (!) 306 lb (138.8 kg)   SpO2 100%   BMI 45.19 kg/m?  ? ?General:   Alert,  Well-developed, well-nourished, pleasant and cooperative in NAD ?Lungs:  Clear throughout to auscultation.   ?Heart:  Regular rate and rhythm; no murmurs, clicks, rubs,  or gallops. ?Abdomen:  Soft, nontender and nondistended. Normal bowel sounds.   ?Neuro/Psych:  Alert and cooperative. Normal mood and affect. A and O x 3 ? ? ?@Kristain Hu  Simonne Maffucci, MD, [facility] ?[facility] ?[phone] (pager) ?06/23/2021 9:54 AM@ ? ?

## 2021-06-23 NOTE — Progress Notes (Signed)
VS CW ? ?Pt's states no medical or surgical changes since previsit or office visit. ? ?

## 2021-06-23 NOTE — Progress Notes (Signed)
Report given to PACU, vss 

## 2021-06-23 NOTE — Patient Instructions (Addendum)
The ileum (end of small intestine) is inflamed - I saw ulcers again. ?I will let you know what biopsies show - suspect we will need to sit down and discuss what to do. ? ?No polyps or cancer seen. ?Your next routine colonoscopy should be in 5 years - 2028. ? ?I appreciate the opportunity to care for you. ?Gatha Mayer, MD, [facility] ? ? ?YOU HAD AN ENDOSCOPIC PROCEDURE TODAY AT [facility]:   Refer to the procedure report that was given to you for any specific questions about what was found during the examination.  If the procedure report does not answer your questions, please call your gastroenterologist to clarify.  If you requested that your care partner not be given the details of your procedure findings, then the procedure report has been included in a sealed envelope for you to review at your convenience later. ? ?YOU SHOULD EXPECT: Some feelings of bloating in the abdomen. Passage of more gas than usual.  Walking can help get rid of the air that was put into your GI tract during the procedure and reduce the bloating. If you had a lower endoscopy (such as a colonoscopy or flexible sigmoidoscopy) you may notice spotting of blood in your stool or on the toilet paper. If you underwent a bowel prep for your procedure, you may not have a normal bowel movement for a few days. ? ?Please Note:  You might notice some irritation and congestion in your nose or some drainage.  This is from the oxygen used during your procedure.  There is no need for concern and it should clear up in a day or so. ? ?SYMPTOMS TO REPORT IMMEDIATELY: ? ?Following lower endoscopy (colonoscopy or flexible sigmoidoscopy): ? Excessive amounts of blood in the stool ? Significant tenderness or worsening of abdominal pains ? Swelling of the abdomen that is new, acute ? Fever of 100?F or higher ? ?For urgent or emergent issues, a gastroenterologist can be reached at any hour by calling [phone]. ?Do not use MyChart messaging for  urgent concerns.  ? ? ?DIET:  We do recommend a small meal at first, but then you may proceed to your regular diet.  Drink plenty of fluids but you should avoid alcoholic beverages for 24 hours. ? ?ACTIVITY:  You should plan to take it easy for the rest of today and you should NOT DRIVE or use heavy machinery until tomorrow (because of the sedation medicines used during the test).   ? ?FOLLOW UP: ?Our staff will call the number listed on your records 48-72 hours following your procedure to check on you and address any questions or concerns that you may have regarding the information given to you following your procedure. If we do not reach you, we will leave a message.  We will attempt to reach you two times.  During this call, we will ask if you have developed any symptoms of COVID 19. If you develop any symptoms (ie: fever, flu-like symptoms, shortness of breath, cough etc.) before then, please call [phone].  If you test positive for Covid 19 in the 2 weeks post procedure, please call and report this information to [place].   ? ?If any biopsies were taken you will be contacted by phone or by letter within the next 1-3 weeks.  Please call us at [phone] if you have not heard about the biopsies in 3 weeks.  ? ? ?SIGNATURES/CONFIDENTIALITY: ?You and/or your care partner have signed paperwork which  will be entered into your electronic medical record.  These signatures attest to the fact that that the information above on your After Visit Summary has been reviewed and is understood.  Full responsibility of the confidentiality of this discharge information lies with you and/or your care-partner.  ? ?

## 2021-06-23 NOTE — Op Note (Signed)
[place] ?Patient Name: Sydney Bradley ?Procedure Date: 06/23/2021 9:48 AM ?MRN: [id number] ?Endoscopist: Gatha Mayer , MD ?Age: 41 ?Referring MD:  ?Date of Birth: 1980-10-30 ?Gender: Female ?Account #: [id number] ?Procedure:                Colonoscopy ?Indications:              Crohn's disease of the small bowel, Follow-up of  ?                          Crohn's disease of the small bowel and Family HGx  ?                          Colon Cancer Father at 68 ?Medicines:                Propofol per Anesthesia, Monitored Anesthesia Care ?Procedure:                Pre-Anesthesia Assessment: ?                          - Prior to the procedure, a History and Physical  ?                          was performed, and patient medications and  ?                          allergies were reviewed. The patient's tolerance of  ?                          previous anesthesia was also reviewed. The risks  ?                          and benefits of the procedure and the sedation  ?                          options and risks were discussed with the patient.  ?                          All questions were answered, and informed consent  ?                          was obtained. Prior Anticoagulants: The patient has  ?                          taken no previous anticoagulant or antiplatelet  ?                          agents. ASA Grade Assessment: III - A patient with  ?                          severe systemic disease. After reviewing the risks  ?                          and benefits, the patient was deemed in  ?  satisfactory condition to undergo the procedure. ?                          After obtaining informed consent, the colonoscope  ?                          was passed under direct vision. Throughout the  ?                          procedure, the patient's blood pressure, pulse, and  ?                          oxygen saturations were monitored continuously. The  ?                          CF [id number]  #[id number] was introduced through the anus  ?                          and advanced to the the terminal ileum, with  ?                          identification of the appendiceal orifice and IC  ?                          valve. The colonoscopy was performed without  ?                          difficulty. The patient tolerated the procedure  ?                          well. The quality of the bowel preparation was  ?                          good. The terminal ileum, ileocecal valve,  ?                          appendiceal orifice, and rectum were photographed.  ?                          The bowel preparation used was Miralax via split  ?                          dose instruction. ?Scope In: 10:04:15 AM ?Scope Out: 10:25:25 AM ?Scope Withdrawal Time: 0 hours 18 minutes 2 seconds  ?Total Procedure Duration: 0 hours 21 minutes 10 seconds  ?Findings:                 The perianal and digital rectal examinations were  ?                          normal. ?                          Inflammation characterized by shallow ulcerations  ?  was found in the terminal ileum. Biopsies were  ?                          taken with a cold forceps for histology.  ?                          Verification of patient identification for the  ?                          specimen was done. Estimated blood loss was minimal. ?                          The entire examined colon appeared normal on direct  ?                          and retroflexion views. ?Complications:            No immediate complications. ?Estimated Blood Loss:     Estimated blood loss was minimal. ?Impression:               - Crohn's disease with ileitis. Biopsied. ?                          - The entire examined colon is normal on direct and  ?                          retroflexion views. ?Recommendation:           - Patient has a contact number available for  ?                          emergencies. The signs and symptoms of potential  ?                           delayed complications were discussed with the  ?                          patient. Return to normal activities tomorrow.  ?                          Written discharge instructions were provided to the  ?                          patient. ?                          - Resume previous diet. ?                          - Continue present medications. ?                          - Await pathology results. ?                          - Repeat colonoscopy in 5 years for screening  ?  purposes. FHx CRCA father (54) ?Gatha Mayer, MD ?06/23/2021 10:33:53 AM ?This report has been signed electronically. ?

## 2021-06-25 ENCOUNTER — Telehealth: Payer: Self-pay | Admitting: *Deleted

## 2021-06-25 NOTE — Telephone Encounter (Signed)
?  Follow up Call- ? ?Call back number 06/23/2021  ?Post procedure Call Back phone  # [phone]  ?Permission to leave phone message Yes  ?Some recent data might be hidden  ?  ? ?Patient questions: ? ?Do you have a fever, pain , or abdominal swelling? No. ?Pain Score  0 * ? ?Have you tolerated food without any problems? Yes.   ? ?Have you been able to return to your normal activities? Yes.   ? ?Do you have any questions about your discharge instructions: ?Diet   No. ?Medications  No. ?Follow up visit  No. ? ?Do you have questions or concerns about your Care? No. ? ?Actions: ?* If pain score is 4 or above: ?No action needed, pain <4. ? ? ?

## 2021-07-22 ENCOUNTER — Ambulatory Visit: Payer: 59 | Admitting: Internal Medicine

## 2021-08-14 ENCOUNTER — Encounter: Payer: Self-pay | Admitting: Internal Medicine

## 2021-08-14 ENCOUNTER — Ambulatory Visit: Payer: 59 | Admitting: Internal Medicine

## 2021-08-14 ENCOUNTER — Other Ambulatory Visit (INDEPENDENT_AMBULATORY_CARE_PROVIDER_SITE_OTHER): Payer: 59

## 2021-08-14 VITALS — BP 120/84 | HR 106 | Ht 69.0 in | Wt 313.0 lb

## 2021-08-14 DIAGNOSIS — K5903 Drug induced constipation: Secondary | ICD-10-CM

## 2021-08-14 DIAGNOSIS — K50018 Crohn's disease of small intestine with other complication: Secondary | ICD-10-CM | POA: Diagnosis not present

## 2021-08-14 DIAGNOSIS — R12 Heartburn: Secondary | ICD-10-CM

## 2021-08-14 LAB — C-REACTIVE PROTEIN: CRP: <1 mg/dL (ref 0.5–20.0)

## 2021-08-14 LAB — SEDIMENTATION RATE: Sed Rate: 45 mm/hr — ABNORMAL HIGH (ref 0–20)

## 2021-08-14 MED ORDER — OMEPRAZOLE 40 MG PO CPDR: 40.0000 mg | DELAYED_RELEASE_CAPSULE | Freq: Every day | ORAL | 0 refills | Status: DC

## 2021-08-14 NOTE — Progress Notes (Addendum)
? ?Sydney Bradley 41 y.o. April 22, 1980 [id number] ? ?Assessment & Plan:  ? ?Encounter Diagnoses  ?Name Primary?  ? Crohn's ileitis, with diarrhea ([place]) Yes  ? Drug-induced constipation   ? Heartburn   ? ? ?Working diagnosis has been Crohn's ileitis though I am not certain of that.  She is aware Crohn's can be difficult to diagnose.  Overall she is doing pretty well from a symptom standpoint and neither she nor I are inclined to start any treatment.  We will do a CT enterography for more information and that could change our minds.  Further plans pending that result I will also check a CRP and a sed rate today. ? ?Have suggested she use MiraLAX on an as needed regimented help with the constipation she has found to occur with phentermine. ? ?Not documented below but she was having an increased incidence of heartburn lately and when she takes Tums that seems to upset her stomach and cause some abdominal distress.  I had recommended I would put her on a PPI for a while to see how that did. ? ?CC: [name], NP ? ? ?Subjective:  ? ?Chief Complaint: Ileitis ? ?HPI ?41 year old African-American woman with obesity and a history of Crohn's disease of the ileum, also family history of colon cancer who presented for a colonoscopy due to the family history, 06/23/2021.  She is here for follow-up after that.  The colon looked normal there were shallow ulcerations in the terminal ileum.  Pathology returned showing focal active ileitis but without changes of chronicity but Crohn's was possible (development stage) per pathology. ? ?I first met her in May 2018 when she was having diarrhea heartburn and suspected food allergies.  She was diagnosed with terminal ileitis and nonspecific biopsies at that time as well.  I treated her with Entocort and she ended up on Cimzia.  She came off those medications.  She changed her diet quite a bit and eliminated foods that she thought were triggers and has done well mostly over time.  Now she  will have 4 maybe 5 spells of 1 to 2 days of cramps and diarrhea a year.  She has been off therapy for at least a couple of years. ? ?Currently using phentermine to try to lose weight and get somewhat constipated. ? ?Allergies  ?Allergen Reactions  ? Latex Hives  ?  Breathing problems  ? Other Anaphylaxis and Itching  ?  Nuts ?Tree nuts  ? Peanut-Containing Drug Products Anaphylaxis  ?  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  ? Shellfish Allergy Anaphylaxis  ? Ciprofibrate Hives  ? Penicillins Hives  ?  Has patient had a PCN reaction causing immediate rash, facial/tongue/throat swelling, SOB or lightheadedness with hypotension: Yes ?Has patient had a PCN reaction causing severe rash involving mucus membranes or skin necrosis: No ?Has patient had a PCN reaction that required hospitalization No ?Has patient had a PCN reaction occurring within the last 10 years: No ?If all of the above answers are "NO", then may proceed with Cephalosporin use. ?  ? Chocolate Itching  ? Ciprofloxacin Hives  ? Lamisil [Terbinafine] Hives  ? Wheat Bran Itching  ? ?Current Meds  ?Medication Sig  ? albuterol (PROVENTIL) (2.5 MG/3ML) 0.083% nebulizer solution Take 3 mLs (2.5 mg total) by nebulization every 4 (four) hours as needed for wheezing or shortness of breath.  ? albuterol (VENTOLIN HFA) 108 (90 Base) MCG/ACT inhaler Inhale 2 puffs into the lungs every 4 (four) hours as needed for wheezing.  ?  ALPRAZolam (XANAX) 0.25 MG tablet Take 1 tablet (0.25 mg total) by mouth 2 (two) times daily as needed for anxiety.  ? buPROPion (WELLBUTRIN XL) 150 MG 24 hr tablet Take 1 tablet (150 mg total) by mouth daily.  ? cetirizine (ZYRTEC) 10 MG tablet Take 10 mg by mouth daily.   ? Clindamycin-Benzoyl Per, Refr, gel Apply topically.  ? clotrimazole-betamethasone (LOTRISONE) cream clotrimazole-betamethasone 1 %-0.05 % topical cream ? APPLY TO THE AFFECTED AND SURROUNDING AREAS OF SKIN BY TOPICAL ROUTE QID prn  ? diphenhydrAMINE (BENADRYL) 25 MG tablet Take 25 mg by  mouth as needed for allergies.   ? ergocalciferol (VITAMIN D2) 1.25 MG (50000 UT) capsule Take by mouth.  ? fluticasone (FLONASE) 50 MCG/ACT nasal spray Place 2 sprays into both nostrils daily.  ? hydroquinone 4 % cream Apply 1 Dose topically daily.  ? phentermine 30 MG capsule Take 1 capsule (30 mg total) by mouth every morning.  ? ?Past Medical History:  ?Diagnosis Date  ? Arthritis   ? L5 Back - no meds - otc prn  ? Asthma   ? Crohn's disease of ileum ([facility])   ? Hypertension   ? no meds x 3 yrs  ? IBS (irritable bowel syndrome)   ? Morbid obesity with BMI of 45.0-49.9, adult ([facility])   ? Sacral fracture ([facility])   ? ?Past Surgical History:  ?Procedure Laterality Date  ? COLONOSCOPY    ? DILATION AND EVACUATION  02/25/2012  ? Procedure: DILATATION AND EVACUATION;  Surgeon: Michelle A Horvath, MD;  Location: [facility];  Service: Gynecology;  Laterality: N/A;  ? DILATION AND EVACUATION N/A 03/01/2013  ? Procedure: DILATATION AND EVACUATION;  Surgeon: Mark E Anderson, MD;  Location: [facility];  Service: Gynecology;  Laterality: N/A;  ? TONSILLECTOMY AND ADENOIDECTOMY  2007  ? ?Social History  ? ?Social History Narrative  ? 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 Married   ? One biologic son (Mason) born 2016 and a stepson  ? One caffeinated beverage daily  ? ?family history includes Alzheimer's disease in her maternal grandfather; COPD in her maternal grandmother; Cancer in her paternal grandmother; Colon cancer (age of onset: 57) in her father; Dementia in her maternal grandmother; Stroke in her paternal grandmother. ? ? ?Review of Systems ?As per HPI ? ?Objective:  ? Physical Exam ?BP 120/84   Pulse (!) 106   Ht 5' 9" (1.753 m)   Wt (!) 313 lb (142 kg)   SpO2 98%   BMI 46.22 kg/m?  ?Obese bw NAD ?Abd soft w/ ? Sl tenderness RLQ ? ?

## 2021-08-14 NOTE — Patient Instructions (Signed)
You will be contacted by [facility] in the next 2 days to arrange a CT enterography.  The number on your caller ID will be [phone], please answer when they call.  If you have not heard from them in 2 days please call [phone] to schedule.   ? ?Your provider has requested that you go to the basement level for lab work before leaving today. Press "B" on the elevator. The lab is located at the first door on the left as you exit the elevator. ? ?Due to recent changes in healthcare laws, you may see the results of your imaging and laboratory studies on MyChart before your provider has had a chance to review them.  We understand that in some cases there may be results that are confusing or concerning to you. Not all laboratory results come back in the same time frame and the provider may be waiting for multiple results in order to interpret others.  Please give [place] 48 hours in order for your provider to thoroughly review all the results before contacting the office for clarification of your results.  ? ?Use Miralax and titrate it for your needs. ? ? ?I appreciate the opportunity to care for you. ?Silvano Rusk, MD, [facility] ? ?

## 2021-08-14 NOTE — Addendum Note (Signed)
Addended by: Gatha Mayer on: 08/14/2021 02:27 PM ? ? Modules accepted: Orders ? ?

## 2021-08-16 ENCOUNTER — Encounter: Payer: Self-pay | Admitting: Internal Medicine

## 2021-08-17 ENCOUNTER — Encounter: Payer: Self-pay | Admitting: Adult Health

## 2021-08-18 ENCOUNTER — Telehealth (INDEPENDENT_AMBULATORY_CARE_PROVIDER_SITE_OTHER): Payer: 59 | Admitting: Adult Health

## 2021-08-18 VITALS — Ht 69.0 in | Wt 313.0 lb

## 2021-08-18 DIAGNOSIS — M2550 Pain in unspecified joint: Secondary | ICD-10-CM

## 2021-08-18 NOTE — Progress Notes (Signed)
Virtual Visit via Video Note ? ?I connected with Sydney Bradley on 08/18/21 at 11:45 AM EDT by a video enabled telemedicine application and verified that I am speaking with the correct person using two identifiers. ? Location patient: home ?Location provider:work or home office ?Persons participating in the virtual visit: patient, provider ? ?I discussed the limitations of evaluation and management by telemedicine and the availability of in person appointments. The patient expressed understanding and agreed to proceed. ? ? ?HPI: ?41 year old female who recently had labs done at gastroenterology which showed a negative CRP and elevated sed rate at 45.  She has been experiencing chronic pain in both knees, back, both hips, both shoulders, wrists, and fingers.  She cannot take NSAIDs due to history of Crohn's disease.  She did have a recent colonoscopy done that showed mild inflammation in the small bowel and has a CT abdomen scheduled for the end of this month. ? ?On x-ray of her right knee and MRI of right knee in the past has shown areas of osteoarthritic changes. ? ? ?ROS: See pertinent positives and negatives per HPI. ? ?Past Medical History:  ?Diagnosis Date  ? Arthritis   ? L5 Back - no meds - otc prn  ? Asthma   ? Crohn's disease of ileum ([place])   ? Hypertension   ? no meds x 3 yrs  ? IBS (irritable bowel syndrome)   ? Morbid obesity with BMI of 45.0-49.9, adult ([place])   ? Sacral fracture ([place])   ? ? ?Past Surgical History:  ?Procedure Laterality Date  ? COLONOSCOPY    ? DILATION AND EVACUATION  02/25/2012  ? Procedure: DILATATION AND EVACUATION;  Surgeon: Daria Pastures, MD;  Location: [facility];  Service: Gynecology;  Laterality: N/A;  ? DILATION AND EVACUATION N/A 03/01/2013  ? Procedure: DILATATION AND EVACUATION;  Surgeon: Olga Millers, MD;  Location: [facility];  Service: Gynecology;  Laterality: N/A;  ? TONSILLECTOMY AND ADENOIDECTOMY  2007  ? ? ?Family History  ?Problem Relation Age of Onset  ? Colon cancer  Father 7  ?     mets to liver and bone died at 59  ? COPD Maternal Grandmother   ? Dementia Maternal Grandmother   ? Alzheimer's disease Maternal Grandfather   ? Cancer Paternal Grandmother   ?     blood cancer?  ? Stroke Paternal Grandmother   ? ? ? ? ? ?Current Outpatient Medications:  ?  albuterol (PROVENTIL) (2.5 MG/3ML) 0.083% nebulizer solution, Take 3 mLs (2.5 mg total) by nebulization every 4 (four) hours as needed for wheezing or shortness of breath., Disp: 75 mL, Rfl: 0 ?  albuterol (VENTOLIN HFA) 108 (90 Base) MCG/ACT inhaler, Inhale 2 puffs into the lungs every 4 (four) hours as needed for wheezing., Disp: 1 Inhaler, Rfl: 3 ?  ALPRAZolam (XANAX) 0.25 MG tablet, Take 1 tablet (0.25 mg total) by mouth 2 (two) times daily as needed for anxiety., Disp: 60 tablet, Rfl: 1 ?  buPROPion (WELLBUTRIN XL) 150 MG 24 hr tablet, Take 1 tablet (150 mg total) by mouth daily., Disp: 90 tablet, Rfl: 1 ?  cetirizine (ZYRTEC) 10 MG tablet, Take 10 mg by mouth daily. , Disp: , Rfl:  ?  Clindamycin-Benzoyl Per, Refr, gel, Apply topically., Disp: , Rfl:  ?  clotrimazole-betamethasone (LOTRISONE) cream, clotrimazole-betamethasone 1 %-0.05 % topical cream  APPLY TO THE AFFECTED AND SURROUNDING AREAS OF SKIN BY TOPICAL ROUTE QID prn, Disp: , Rfl:  ?  diphenhydrAMINE (BENADRYL) 25 MG  tablet, Take 25 mg by mouth as needed for allergies. , Disp: , Rfl:  ?  ergocalciferol (VITAMIN D2) 1.25 MG (50000 UT) capsule, Take by mouth., Disp: , Rfl:  ?  fluticasone (FLONASE) 50 MCG/ACT nasal spray, Place 2 sprays into both nostrils daily., Disp: 16 g, Rfl: 6 ?  hydroquinone 4 % cream, Apply 1 Dose topically daily., Disp: , Rfl:  ?  omeprazole (PRILOSEC) 40 MG capsule, Take 1 capsule (40 mg total) by mouth daily before breakfast., Disp: 90 capsule, Rfl: 0 ?  phentermine 30 MG capsule, Take 1 capsule (30 mg total) by mouth every morning., Disp: 30 capsule, Rfl: 2 ? ?EXAM: ? ?VITALS per patient if applicable: ? ?GENERAL: alert, oriented,  appears well and in no acute distress ? ?HEENT: atraumatic, conjunttiva clear, no obvious abnormalities on inspection of external nose and ears ? ?NECK: normal movements of the head and neck ? ?LUNGS: on inspection no signs of respiratory distress, breathing rate appears normal, no obvious gross SOB, gasping or wheezing ? ?CV: no obvious cyanosis ? ?MS: moves all visible extremities without noticeable abnormality ? ?PSYCH/NEURO: pleasant and cooperative, no obvious depression or anxiety, speech and thought processing grossly intact ? ?ASSESSMENT AND PLAN: ? ?Discussed the following assessment and plan: ? ?1. Polyarthralgia ?-Discussed limitations of both CRP and ESR.  Possible false positive with ESR.  Will check CBC, BMP, ANA, and rheumatoid panel.  Consider Lyrica or gabapentin for joint pain ?- CBC with Differential/Platelet; Future ?- Basic metabolic panel; Future ?- ANA; Future ?- Rheumatoid Factor; Future ? ? ? ?I discussed the assessment and treatment plan with the patient. The patient was provided an opportunity to ask questions and all were answered. The patient agreed with the plan and demonstrated an understanding of the instructions. ?  ?The patient was advised to call back or seek an in-person evaluation if the symptoms worsen or if the condition fails to improve as anticipated. ? ? ?[name], NP  ? ?

## 2021-08-18 NOTE — Telephone Encounter (Signed)
Pt scheduled for a phone visit.  ?

## 2021-08-19 ENCOUNTER — Other Ambulatory Visit (INDEPENDENT_AMBULATORY_CARE_PROVIDER_SITE_OTHER): Payer: 59

## 2021-08-19 DIAGNOSIS — M2550 Pain in unspecified joint: Secondary | ICD-10-CM

## 2021-08-19 LAB — BASIC METABOLIC PANEL
BUN: 7 mg/dL (ref 6–23)
CO2: 23 mEq/L (ref 19–32)
Calcium: 8.8 mg/dL (ref 8.4–10.5)
Chloride: 102 mEq/L (ref 96–112)
Creatinine, Ser: 0.95 mg/dL (ref 0.40–1.20)
GFR: 74.03 mL/min (ref >60.00)
Glucose, Bld: 94 mg/dL (ref 70–99)
Potassium: 3.7 mEq/L (ref 3.5–5.1)
Sodium: 133 mEq/L — ABNORMAL LOW (ref 135–145)

## 2021-08-19 LAB — CBC WITH DIFFERENTIAL/PLATELET
Basophils Absolute: 0 10*3/uL (ref 0.0–0.1)
Basophils Relative: 0.5 % (ref 0.0–3.0)
Eosinophils Absolute: 0 10*3/uL (ref 0.0–0.7)
Eosinophils Relative: 0.6 % (ref 0.0–5.0)
HCT: 38.4 % (ref 36.0–46.0)
Hemoglobin: 12.5 g/dL (ref 12.0–15.0)
Lymphocytes Relative: 44.1 % (ref 12.0–46.0)
Lymphs Abs: 2.8 10*3/uL (ref 0.7–4.0)
MCHC: 32.5 g/dL (ref 30.0–36.0)
MCV: 82.5 fl (ref 78.0–100.0)
Monocytes Absolute: 0.4 10*3/uL (ref 0.1–1.0)
Monocytes Relative: 7 % (ref 3.0–12.0)
Neutro Abs: 3 10*3/uL (ref 1.4–7.7)
Neutrophils Relative %: 47.8 % (ref 43.0–77.0)
Platelets: 237 10*3/uL (ref 150.0–400.0)
RBC: 4.65 Mil/uL (ref 3.87–5.11)
RDW: 13 % (ref 11.5–15.5)
WBC: 6.3 10*3/uL (ref 4.0–10.5)

## 2021-08-19 NOTE — Addendum Note (Signed)
Addended by: Susy Manor on: 04/30/8675 02:35 PM ? ? Modules accepted: Orders ? ?

## 2021-08-19 NOTE — Addendum Note (Signed)
Addended by: Susy Manor on: 1/91/5502 02:35 PM ? ? Modules accepted: Orders ? ?

## 2021-08-19 NOTE — Addendum Note (Signed)
Addended by: Susy Manor on: 04/22/5518 02:35 PM ? ? Modules accepted: Orders ? ?

## 2021-08-21 ENCOUNTER — Encounter: Payer: Self-pay | Admitting: Adult Health

## 2021-08-21 ENCOUNTER — Other Ambulatory Visit: Payer: Self-pay | Admitting: Adult Health

## 2021-08-21 LAB — RHEUMATOID FACTOR: Rheumatoid fact SerPl-aCnc: <14 IU/mL (ref <14)

## 2021-08-21 LAB — ANA: Anti Nuclear Antibody (ANA): NEGATIVE

## 2021-08-21 MED ORDER — DULOXETINE HCL 30 MG PO CPEP: 30.0000 mg | ORAL_CAPSULE | Freq: Every day | ORAL | 0 refills | Status: DC

## 2021-08-24 ENCOUNTER — Encounter: Payer: Self-pay | Admitting: Adult Health

## 2021-08-25 ENCOUNTER — Encounter: Payer: Self-pay | Admitting: Adult Health

## 2021-08-25 ENCOUNTER — Telehealth (INDEPENDENT_AMBULATORY_CARE_PROVIDER_SITE_OTHER): Payer: 59 | Admitting: Adult Health

## 2021-08-25 VITALS — Wt 307.0 lb

## 2021-08-25 DIAGNOSIS — F419 Anxiety disorder, unspecified: Secondary | ICD-10-CM | POA: Diagnosis not present

## 2021-08-25 DIAGNOSIS — Z6841 Body Mass Index (BMI) 40.0 and over, adult: Secondary | ICD-10-CM

## 2021-08-25 DIAGNOSIS — E668 Other obesity: Secondary | ICD-10-CM

## 2021-08-25 MED ORDER — PHENTERMINE HCL 37.5 MG PO CAPS: 37.5000 mg | ORAL_CAPSULE | ORAL | 2 refills | Status: DC

## 2021-08-25 NOTE — Progress Notes (Signed)
Virtual Visit via Video Note ? ?I connected with Sydney Bradley on 08/25/21 at  5:00 PM EDT by a video enabled telemedicine application and verified that I am speaking with the correct person using two identifiers. ? Location patient: home ?Location provider:work or home office ?Persons participating in the virtual visit: patient, provider ? ?I discussed the limitations of evaluation and management by telemedicine and the availability of in person appointments. The patient expressed understanding and agreed to proceed. ? ? ?HPI: ? ?41 year old female who  has a past medical history of Arthritis, Asthma, Crohn's disease of ileum ([place]), Hypertension, IBS (irritable bowel syndrome), Morbid obesity with BMI of 45.0-49.9, adult ([place]), and Sacral fracture ([place]). ? ?She is being evaluated today for follow-up regarding anxiety and weight loss management. ? ?Her anxiety has been very well controlled on Wellbutrin 150 mg ER daily.  She has a prescription for Xanax 0.25 mg as needed but she very rarely takes this medication. ? ?As for weight loss/obesity management, he is currently prescribed phentermine 30 milligrams.  She feels as though she has hit a plateau.  She is not gaining weight but she is not losing either.  She is having some breakthrough hunger.  Continues to eat healthy and cut back on portion sizes but she has not been able to exercise due to chronic knee pain. ? ?Wt Readings from Last 3 Encounters:  ?08/25/21 (!) 307 lb (139.3 kg)  ?08/18/21 (!) 313 lb (142 kg)  ?08/14/21 (!) 313 lb (142 kg)  ? ? ? ?ROS: See pertinent positives and negatives per HPI. ? ?Past Medical History:  ?Diagnosis Date  ? Arthritis   ? L5 Back - no meds - otc prn  ? Asthma   ? Crohn's disease of ileum ([place])   ? Hypertension   ? no meds x 3 yrs  ? IBS (irritable bowel syndrome)   ? Morbid obesity with BMI of 45.0-49.9, adult ([place])   ? Sacral fracture ([place])   ? ? ?Past Surgical History:  ?Procedure Laterality Date  ? COLONOSCOPY    ? DILATION  AND EVACUATION  02/25/2012  ? Procedure: DILATATION AND EVACUATION;  Surgeon: Daria Pastures, MD;  Location: [facility];  Service: Gynecology;  Laterality: N/A;  ? DILATION AND EVACUATION N/A 03/01/2013  ? Procedure: DILATATION AND EVACUATION;  Surgeon: Olga Millers, MD;  Location: [facility];  Service: Gynecology;  Laterality: N/A;  ? TONSILLECTOMY AND ADENOIDECTOMY  2007  ? ? ?Family History  ?Problem Relation Age of Onset  ? Colon cancer Father 94  ?     mets to liver and bone died at 52  ? COPD Maternal Grandmother   ? Dementia Maternal Grandmother   ? Alzheimer's disease Maternal Grandfather   ? Cancer Paternal Grandmother   ?     blood cancer?  ? Stroke Paternal Grandmother   ? ? ? ? ? ?Current Outpatient Medications:  ?  DULoxetine (CYMBALTA) 30 MG capsule, Take 1 capsule (30 mg total) by mouth daily., Disp: 30 capsule, Rfl: 0 ?  albuterol (PROVENTIL) (2.5 MG/3ML) 0.083% nebulizer solution, Take 3 mLs (2.5 mg total) by nebulization every 4 (four) hours as needed for wheezing or shortness of breath., Disp: 75 mL, Rfl: 0 ?  albuterol (VENTOLIN HFA) 108 (90 Base) MCG/ACT inhaler, Inhale 2 puffs into the lungs every 4 (four) hours as needed for wheezing., Disp: 1 Inhaler, Rfl: 3 ?  ALPRAZolam (XANAX) 0.25 MG tablet, Take 1 tablet (0.25 mg total) by mouth  2 (two) times daily as needed for anxiety., Disp: 60 tablet, Rfl: 1 ?  buPROPion (WELLBUTRIN XL) 150 MG 24 hr tablet, Take 1 tablet (150 mg total) by mouth daily., Disp: 90 tablet, Rfl: 1 ?  cetirizine (ZYRTEC) 10 MG tablet, Take 10 mg by mouth daily. , Disp: , Rfl:  ?  Clindamycin-Benzoyl Per, Refr, gel, Apply topically., Disp: , Rfl:  ?  clotrimazole-betamethasone (LOTRISONE) cream, clotrimazole-betamethasone 1 %-0.05 % topical cream  APPLY TO THE AFFECTED AND SURROUNDING AREAS OF SKIN BY TOPICAL ROUTE QID prn, Disp: , Rfl:  ?  diphenhydrAMINE (BENADRYL) 25 MG tablet, Take 25 mg by mouth as needed for allergies. , Disp: , Rfl:  ?  ergocalciferol (VITAMIN D2)  1.25 MG (50000 UT) capsule, Take by mouth., Disp: , Rfl:  ?  fluticasone (FLONASE) 50 MCG/ACT nasal spray, Place 2 sprays into both nostrils daily., Disp: 16 g, Rfl: 6 ?  hydroquinone 4 % cream, Apply 1 Dose topically daily., Disp: , Rfl:  ?  omeprazole (PRILOSEC) 40 MG capsule, Take 1 capsule (40 mg total) by mouth daily before breakfast., Disp: 90 capsule, Rfl: 0 ? ?EXAM: ? ?VITALS per patient if applicable: ? ?GENERAL: alert, oriented, appears well and in no acute distress ? ?HEENT: atraumatic, conjunttiva clear, no obvious abnormalities on inspection of external nose and ears ? ?NECK: normal movements of the head and neck ? ?LUNGS: on inspection no signs of respiratory distress, breathing rate appears normal, no obvious gross SOB, gasping or wheezing ? ?CV: no obvious cyanosis ? ?MS: moves all visible extremities without noticeable abnormality ? ?PSYCH/NEURO: pleasant and cooperative, no obvious depression or anxiety, speech and thought processing grossly intact ? ?ASSESSMENT AND PLAN: ? ?Discussed the following assessment and plan: ? ?1. Other obesity ?- Will increase her phentermine to 37.5 mg daily.  ?- Follow up in 30 days  ?- phentermine 37.5 MG capsule; Take 1 capsule (37.5 mg total) by mouth every morning.  Dispense: 30 capsule; Refill: 2 ? ?2. BMI 45.0-49.9, adult ([place]) ? ?- phentermine 37.5 MG capsule; Take 1 capsule (37.5 mg total) by mouth every morning.  Dispense: 30 capsule; Refill: 2 ? ?3. Anxiety ?- Continue with Wellburin 150 mg XR and Xanax PRN  ? ? ?[name], NP ? ?  ?I discussed the assessment and treatment plan with the patient. The patient was provided an opportunity to ask questions and all were answered. The patient agreed with the plan and demonstrated an understanding of the instructions. ?  ?The patient was advised to call back or seek an in-person evaluation if the symptoms worsen or if the condition fails to improve as anticipated. ? ? ?[name], NP  ? ?

## 2021-09-04 ENCOUNTER — Ambulatory Visit (HOSPITAL_COMMUNITY)
Admission: RE | Admit: 2021-09-04 | Discharge: 2021-09-04 | Disposition: A | Discharge status: Home / Self Care | Payer: 59 | Source: Ambulatory Visit | Attending: Internal Medicine | Admitting: Internal Medicine

## 2021-09-04 ENCOUNTER — Ambulatory Visit (HOSPITAL_COMMUNITY): Payer: 59

## 2021-09-04 DIAGNOSIS — K50018 Crohn's disease of small intestine with other complication: Secondary | ICD-10-CM | POA: Diagnosis not present

## 2021-09-04 MED ORDER — SODIUM CHLORIDE (PF) 0.9 % IJ SOLN
INTRAMUSCULAR | Status: AC
  Filled 2021-09-04: qty 50

## 2021-09-04 MED ORDER — BARIUM SULFATE 0.1 % PO SUSP
ORAL | Status: AC
  Filled 2021-09-04: qty 3

## 2021-09-04 MED ORDER — IOHEXOL 300 MG/ML  SOLN
100.0000 mL | Freq: Once | INTRAMUSCULAR | Status: AC | PRN
  Administered 2021-09-04: 100 mL via INTRAVENOUS

## 2021-09-13 ENCOUNTER — Other Ambulatory Visit: Payer: Self-pay | Admitting: Adult Health

## 2021-09-13 DIAGNOSIS — F419 Anxiety disorder, unspecified: Secondary | ICD-10-CM

## 2021-09-19 ENCOUNTER — Emergency Department (HOSPITAL_BASED_OUTPATIENT_CLINIC_OR_DEPARTMENT_OTHER)
Admission: EM | Admit: 2021-09-19 | Discharge: 2021-09-19 | Disposition: A | Discharge status: Home / Self Care | Payer: 59 | Attending: Emergency Medicine | Admitting: Emergency Medicine

## 2021-09-19 ENCOUNTER — Other Ambulatory Visit: Payer: Self-pay

## 2021-09-19 ENCOUNTER — Emergency Department (HOSPITAL_BASED_OUTPATIENT_CLINIC_OR_DEPARTMENT_OTHER): Payer: 59

## 2021-09-19 ENCOUNTER — Encounter (HOSPITAL_BASED_OUTPATIENT_CLINIC_OR_DEPARTMENT_OTHER): Payer: Self-pay | Admitting: *Deleted

## 2021-09-19 DIAGNOSIS — S90112A Contusion of left great toe without damage to nail, initial encounter: Secondary | ICD-10-CM | POA: Diagnosis not present

## 2021-09-19 DIAGNOSIS — Z91040 Latex allergy status: Secondary | ICD-10-CM | POA: Diagnosis not present

## 2021-09-19 DIAGNOSIS — X500XXA Overexertion from strenuous movement or load, initial encounter: Secondary | ICD-10-CM | POA: Insufficient documentation

## 2021-09-19 DIAGNOSIS — Z91010 Allergy to peanuts: Secondary | ICD-10-CM | POA: Insufficient documentation

## 2021-09-19 DIAGNOSIS — S90932A Unspecified superficial injury of left great toe, initial encounter: Secondary | ICD-10-CM | POA: Diagnosis present

## 2021-09-19 NOTE — ED Provider Notes (Signed)
[facility] EMERGENCY DEPT Provider Note   CSN: [id number] Arrival date & time: 09/19/21  1905     History  Chief Complaint  Patient presents with   Toe Injury    Sydney Bradley is a 41 y.o. female.  She is here with a complaint of left great toe pain after dropping something heavy on it yesterday.  She is also complaining of some tingling in that toe.  She tried elevation.  She called her primary care doctor who recommended she come to the emergency department for evaluation.  The history is provided by the patient.  Toe Pain This is a new problem. The current episode started yesterday. The problem occurs constantly. The problem has not changed since onset.Pertinent negatives include no chest pain, no abdominal pain, no headaches and no shortness of breath. The symptoms are aggravated by bending and walking. Nothing relieves the symptoms. She has tried rest and a cold compress for the symptoms. The treatment provided mild relief.       Home Medications Prior to Admission medications   Medication Sig Start Date End Date Taking? Authorizing Provider  DULoxetine (CYMBALTA) 30 MG capsule Take 1 capsule (30 mg total) by mouth daily. 08/21/21 09/20/21  Nafziger, Tommi Rumps, NP  albuterol (PROVENTIL) (2.5 MG/3ML) 0.083% nebulizer solution Take 3 mLs (2.5 mg total) by nebulization every 4 (four) hours as needed for wheezing or shortness of breath. 03/10/21   Burchette, Alinda Sierras, MD  albuterol (VENTOLIN HFA) 108 (90 Base) MCG/ACT inhaler Inhale 2 puffs into the lungs every 4 (four) hours as needed for wheezing. 08/03/18   Nafziger, Tommi Rumps, NP  ALPRAZolam Duanne Moron) 0.25 MG tablet Take 1 tablet (0.25 mg total) by mouth 2 (two) times daily as needed for anxiety. 12/26/20   Nafziger, Tommi Rumps, NP  buPROPion (WELLBUTRIN XL) 150 MG 24 hr tablet TAKE ONE TABLET BY MOUTH ONE TIME DAILY 09/15/21   Nafziger, Tommi Rumps, NP  cetirizine (ZYRTEC) 10 MG tablet Take 10 mg by mouth daily.     [provider]   Clindamycin-Benzoyl Per, Refr, gel Apply topically. 04/10/21   [provider]  clotrimazole-betamethasone (LOTRISONE) cream clotrimazole-betamethasone 1 %-0.05 % topical cream  APPLY TO THE AFFECTED AND SURROUNDING AREAS OF SKIN BY TOPICAL ROUTE QID prn    [provider]  diphenhydrAMINE (BENADRYL) 25 MG tablet Take 25 mg by mouth as needed for allergies.     [provider]  ergocalciferol (VITAMIN D2) 1.25 MG (50000 UT) capsule Take by mouth. 12/05/18   [provider]  fluticasone (FLONASE) 50 MCG/ACT nasal spray Place 2 sprays into both nostrils daily. 01/11/18   Nafziger, Tommi Rumps, NP  hydroquinone 4 % cream Apply 1 Dose topically daily.    [provider]  omeprazole (PRILOSEC) 40 MG capsule Take 1 capsule (40 mg total) by mouth daily before breakfast. 08/14/21   Gatha Mayer, MD  phentermine 37.5 MG capsule Take 1 capsule (37.5 mg total) by mouth every morning. 08/25/21   Nafziger, Tommi Rumps, NP      Allergies    Latex, Other, Peanut-containing drug products, Shellfish allergy, Ciprofibrate, Penicillins, Chocolate, Ciprofloxacin, Lamisil [terbinafine], and Wheat bran    Review of Systems   Review of Systems  Respiratory:  Negative for shortness of breath.   Cardiovascular:  Negative for chest pain.  Gastrointestinal:  Negative for abdominal pain.  Skin:  Positive for color change.  Neurological:  Negative for headaches.    Physical Exam Updated Vital Signs BP 135/86 (BP Location: Right Arm)  Pulse 83   Temp 98.2 F (36.8 C) (Oral)   Resp 17   LMP 08/24/2021   SpO2 100%  Physical Exam Constitutional:      Appearance: Normal appearance. She is well-developed.  HENT:     Head: Normocephalic and atraumatic.  Eyes:     Conjunctiva/sclera: Conjunctivae normal.  Musculoskeletal:        General: Swelling and tenderness present. No deformity.     Cervical back: Neck supple.     Comments: She has some bruising and swelling of her left  great toe primarily at the IP joint.  Sensation and cap refill intact.  Other digits nontender.  Midfoot and ankle nontender.  Skin:    General: Skin is warm and dry.  Neurological:     General: No focal deficit present.     Mental Status: She is alert.     GCS: GCS eye subscore is 4. GCS verbal subscore is 5. GCS motor subscore is 6.     Sensory: No sensory deficit.     Motor: No weakness.     ED Results / Procedures / Treatments   Labs (all labs ordered are listed, but only abnormal results are displayed) Labs Reviewed - No data to display  EKG None  Radiology DG Toe Great Left  Result Date: 09/19/2021 CLINICAL DATA:  Blunt trauma. EXAM: LEFT GREAT TOE COMPARISON:  None Available. FINDINGS: There is no evidence of fracture or dislocation. There is no evidence of arthropathy or other focal bone abnormality. Soft tissues are unremarkable. IMPRESSION: No fracture dislocation of the great toe. Electronically Signed   By: Suzy Bouchard M.D.   On: 09/19/2021 20:10    Procedures Procedures    Medications Ordered in ED Medications - No data to display  ED Course/ Medical Decision Making/ A&P                           Medical Decision Making Amount and/or Complexity of Data Reviewed Radiology: ordered.   Differential diagnosis includes fracture, dislocation, contusion, hematoma.        Final Clinical Impression(s) / ED Diagnoses Final diagnoses:  Contusion of left great toe without damage to nail, initial encounter    Rx / DC Orders ED Discharge Orders     None         Hayden Rasmussen, MD 09/20/21 [phone]

## 2021-09-19 NOTE — ED Triage Notes (Addendum)
Patient states that she dropped work bag on left big toe yesterday after work. Has been elevating and icing. States today noticed bruising and tingling with decreased motion, stated unable to bend. Bruising noted to lateral aspect of left toe. No deformity noted.

## 2021-09-19 NOTE — Discharge Instructions (Signed)
You are seen in the emergency department for pain and swelling to your left great toe after dropping something heavy on it.  You had an x-ray that did not show any obvious fracture.  Please continue to ice area.  [place] shoe or buddy tape.  Ibuprofen for pain.  Weightbearing as tolerated.  Follow-up with podiatry if any continued problems.

## 2021-10-03 ENCOUNTER — Other Ambulatory Visit: Payer: Self-pay | Admitting: Adult Health

## 2021-10-07 ENCOUNTER — Ambulatory Visit: Payer: 59 | Admitting: Family Medicine

## 2021-10-07 VITALS — BP 120/80 | HR 90 | Temp 98.0°F | Ht 69.0 in | Wt 313.2 lb

## 2021-10-07 DIAGNOSIS — H66004 Acute suppurative otitis media without spontaneous rupture of ear drum, recurrent, right ear: Secondary | ICD-10-CM

## 2021-10-07 MED ORDER — DOXYCYCLINE HYCLATE 100 MG PO CAPS: 100.0000 mg | ORAL_CAPSULE | Freq: Two times a day (BID) | ORAL | 0 refills | Status: DC

## 2021-10-07 MED ORDER — CLOTRIMAZOLE-BETAMETHASONE 1-0.05 % EX CREA: TOPICAL_CREAM | Freq: Two times a day (BID) | CUTANEOUS | 0 refills | Status: DC | PRN

## 2021-10-07 NOTE — Progress Notes (Signed)
Established Patient Office Visit  Subjective   Patient ID: Sydney Bradley, female    DOB: 03-05-1981  Age: 41 y.o. MRN: [id number]  Chief Complaint  Patient presents with   Ear Fullness    Patient complains of ear fullness, x2 days, Tried Sudafed and Tylenol with little relief    Cough    Patient complains of cough, x2 days Tried Sudafed and Tylenol with little relief    Sore Throat    Patient complains of sore throat, x2 days, Tried Sudafed and Tylenol with little relief     HPI   Sydney Bradley is seen with symptoms of cough, sore throat, right earache.  Onset Monday of right ear ache and sore throat.  Says at times developed some cough and mild body aches.  Intermittent chills but no fever.  Occasional headaches and some right-sided facial pain.  Apparently had recurrent ear infections in the past.  Does have reported history of asthma but no obvious wheezing at this time.  Past Medical History:  Diagnosis Date   Arthritis    L5 Back - no meds - otc prn   Asthma    Crohn's disease of ileum ([place])    Hypertension    no meds x 3 yrs   IBS (irritable bowel syndrome)    Morbid obesity with BMI of 45.0-49.9, adult ([place])    Sacral fracture ([place])    Past Surgical History:  Procedure Laterality Date   COLONOSCOPY     DILATION AND EVACUATION  02/25/2012   Procedure: DILATATION AND EVACUATION;  Surgeon: Daria Pastures, MD;  Location: [facility];  Service: Gynecology;  Laterality: N/A;   DILATION AND EVACUATION N/A 03/01/2013   Procedure: DILATATION AND EVACUATION;  Surgeon: Olga Millers, MD;  Location: [facility];  Service: Gynecology;  Laterality: N/A;   TONSILLECTOMY AND ADENOIDECTOMY  2007    reports that she has never smoked. She has never used smokeless tobacco. She reports that she does not drink alcohol and does not use drugs. family history includes Alzheimer's disease in her maternal grandfather; COPD in her maternal grandmother; Cancer in her paternal grandmother; Colon cancer (age of  onset: 60) in her father; Dementia in her maternal grandmother; Stroke in her paternal grandmother. Allergies  Allergen Reactions   Latex Hives    Breathing problems   Other Anaphylaxis and Itching    Nuts Tree nuts   Peanut-Containing Drug Products Anaphylaxis    Allergy to all nuts    Shellfish Allergy Anaphylaxis   Ciprofibrate Hives   Penicillins Hives    Has patient had a PCN reaction causing immediate rash, facial/tongue/throat swelling, SOB or lightheadedness with hypotension: Yes Has patient had a PCN reaction causing severe rash involving mucus membranes or skin necrosis: No Has patient had a PCN reaction that required hospitalization No Has patient had a PCN reaction occurring within the last 10 years: No If all of the above answers are "NO", then may proceed with Cephalosporin use.    Chocolate Itching   Ciprofloxacin Hives   Lamisil [Terbinafine] Hives   Wheat Bran Itching    Review of Systems  Constitutional:  Positive for chills. Negative for fever.  HENT:  Positive for congestion, ear pain and sore throat.   Respiratory:  Positive for cough.       Objective:     BP 120/80 (BP Location: Left Arm, Patient Position: Sitting, Cuff Size: Normal)   Pulse 90   Temp 98 F (36.7 C) (Oral)   Ht  5' 9"  (1.753 m)   Wt (!) 313 lb 3.2 oz (142.1 kg)   LMP 08/24/2021   SpO2 98%   BMI 46.25 kg/m    Physical Exam Vitals reviewed.  Constitutional:      General: She is not in acute distress.    Appearance: She is well-developed.  HENT:     Ears:     Comments: Left eardrum appears normal.  Right eardrum reveals some erythema especially along the superior and posterior portion.  No perforation.    Mouth/Throat:     Comments: Oropharynx reveals previous tonsillectomy.  She has some mild erythema posterior to this region but no exudate. Cardiovascular:     Rate and Rhythm: Normal rate and regular rhythm.  Pulmonary:     Breath sounds: Normal breath sounds. No  wheezing or rales.  Skin:    Findings: No rash.  Neurological:     Mental Status: She is alert.      No results found for any visits on 10/07/21.    The 10-year ASCVD risk score (Arnett DK, et al., 2019) is: 0.5%    Assessment & Plan:   Problem List Items Addressed This Visit   None Visit Diagnoses     Recurrent acute suppurative otitis media of right ear without spontaneous rupture of tympanic membrane    -  Primary   Relevant Medications   doxycycline (VIBRAMYCIN) 100 MG capsule     Probable viral URI.  She does have evidence for some erythema involving the right eardrum and suspect possibly early right otitis media.  She has had frequent right ear infections in the past. Discussed options of observation versus antibiotic.  We elected to start the latter with her tendency to get severe infections in the past.  Penicillin allergic.  Start doxycycline 100 mg twice daily for 10 days  No follow-ups on file.    Carolann Littler, MD

## 2021-10-08 LAB — POCT INFLUENZA A/B
Influenza A, POC: NEGATIVE
Influenza B, POC: NEGATIVE

## 2021-10-08 LAB — POCT RAPID STREP A (OFFICE): Rapid Strep A Screen: NEGATIVE

## 2021-10-08 LAB — POC COVID19 BINAXNOW: SARS Coronavirus 2 Ag: NEGATIVE

## 2021-10-08 NOTE — Addendum Note (Signed)
Addended by: Gwenyth Ober R on: 10/08/2021 12:14 AM   Modules accepted: Orders

## 2021-10-12 ENCOUNTER — Telehealth: Payer: Self-pay | Admitting: Adult Health

## 2021-10-12 NOTE — Telephone Encounter (Signed)
Was seen by Dr Elease Hashimoto, now being treated for ear infection, still has throbbing pain, dull pain deep inside ear, treating with tylenol but give temporary relief. Was at a band competition, wore noise cancelling headphones but pain is still present. Seeking guidance as to whether she needs another appointment or more medication.

## 2021-10-14 NOTE — Telephone Encounter (Signed)
Patient informed of the message below.

## 2021-10-15 ENCOUNTER — Other Ambulatory Visit: Payer: Self-pay | Admitting: Adult Health

## 2021-10-15 ENCOUNTER — Encounter: Payer: Self-pay | Admitting: Adult Health

## 2021-10-15 ENCOUNTER — Telehealth (INDEPENDENT_AMBULATORY_CARE_PROVIDER_SITE_OTHER): Payer: 59 | Admitting: Adult Health

## 2021-10-15 DIAGNOSIS — R519 Headache, unspecified: Secondary | ICD-10-CM | POA: Diagnosis not present

## 2021-10-15 DIAGNOSIS — F419 Anxiety disorder, unspecified: Secondary | ICD-10-CM

## 2021-10-15 MED ORDER — TIZANIDINE HCL 4 MG PO TABS: 4.0000 mg | ORAL_TABLET | Freq: Every day | ORAL | 0 refills | Status: DC

## 2021-10-15 MED ORDER — METHYLPREDNISOLONE 4 MG PO TBPK: ORAL_TABLET | ORAL | 0 refills | Status: DC

## 2021-10-15 NOTE — Progress Notes (Signed)
Virtual Visit via Video Note  I connected with Sydney Bradley on 10/15/21 at  3:00 PM EDT by a video enabled telemedicine application and verified that I am speaking with the correct person using two identifiers.  Location patient: home Location provider:work or home office Persons participating in the virtual visit: patient, provider  I discussed the limitations of evaluation and management by telemedicine and the availability of in person appointments. The patient expressed understanding and agreed to proceed.   HPI: 41 year old female who is being evaluated today for right-sided headache that has been present for the last week.  She was seen 9 days ago by another provider in the office and diagnosed with acute otitis media.  She was prescribed a 10-day course of doxycycline.  She reports that her symptoms were improving until she went to her son's band recital, which was inside.  He reports that it was very loud and her headache started.  She also reports that she has noticed that she is likely grinding her teeth more and has recently restarted her mouthguard.  Headache is located behind the right ear and radiates into the neck.  Denies vision issues, ear drainage, sinusitis, fevers, or chills   ROS: See pertinent positives and negatives per HPI.  Past Medical History:  Diagnosis Date   Arthritis    L5 Back - no meds - otc prn   Asthma    Crohn's disease of ileum ([place])    Hypertension    no meds x 3 yrs   IBS (irritable bowel syndrome)    Morbid obesity with BMI of 45.0-49.9, adult ([place])    Sacral fracture ([place])     Past Surgical History:  Procedure Laterality Date   COLONOSCOPY     DILATION AND EVACUATION  02/25/2012   Procedure: DILATATION AND EVACUATION;  Surgeon: Daria Pastures, MD;  Location: [facility];  Service: Gynecology;  Laterality: N/A;   DILATION AND EVACUATION N/A 03/01/2013   Procedure: DILATATION AND EVACUATION;  Surgeon: Olga Millers, MD;  Location: [facility];   Service: Gynecology;  Laterality: N/A;   TONSILLECTOMY AND ADENOIDECTOMY  2007    Family History  Problem Relation Age of Onset   Colon cancer Father 52       mets to liver and bone died at 20   COPD Maternal Grandmother    Dementia Maternal Grandmother    Alzheimer's disease Maternal Grandfather    Cancer Paternal Grandmother        blood cancer?   Stroke Paternal Grandmother        Current Outpatient Medications:    albuterol (PROVENTIL) (2.5 MG/3ML) 0.083% nebulizer solution, Take 3 mLs (2.5 mg total) by nebulization every 4 (four) hours as needed for wheezing or shortness of breath., Disp: 75 mL, Rfl: 0   albuterol (VENTOLIN HFA) 108 (90 Base) MCG/ACT inhaler, Inhale 2 puffs into the lungs every 4 (four) hours as needed for wheezing., Disp: 1 Inhaler, Rfl: 3   ALPRAZolam (XANAX) 0.25 MG tablet, Take 1 tablet (0.25 mg total) by mouth 2 (two) times daily as needed for anxiety., Disp: 60 tablet, Rfl: 1   buPROPion (WELLBUTRIN XL) 150 MG 24 hr tablet, TAKE ONE TABLET BY MOUTH ONE TIME DAILY, Disp: 90 tablet, Rfl: 1   cetirizine (ZYRTEC) 10 MG tablet, Take 10 mg by mouth daily. , Disp: , Rfl:    Clindamycin-Benzoyl Per, Refr, gel, Apply topically., Disp: , Rfl:    clotrimazole-betamethasone (LOTRISONE) cream, Apply topically 2 (two) times daily as  needed., Disp: 30 g, Rfl: 0   diphenhydrAMINE (BENADRYL) 25 MG tablet, Take 25 mg by mouth as needed for allergies. , Disp: , Rfl:    doxycycline (VIBRAMYCIN) 100 MG capsule, Take 1 capsule (100 mg total) by mouth 2 (two) times daily., Disp: 20 capsule, Rfl: 0   ergocalciferol (VITAMIN D2) 1.25 MG (50000 UT) capsule, Take by mouth., Disp: , Rfl:    fluticasone (FLONASE) 50 MCG/ACT nasal spray, Place 2 sprays into both nostrils daily., Disp: 16 g, Rfl: 6   hydroquinone 4 % cream, Apply 1 Dose topically daily., Disp: , Rfl:    omeprazole (PRILOSEC) 40 MG capsule, Take 1 capsule (40 mg total) by mouth daily before breakfast., Disp: 90 capsule,  Rfl: 0   phentermine 37.5 MG capsule, Take 1 capsule (37.5 mg total) by mouth every morning., Disp: 30 capsule, Rfl: 2  EXAM:  VITALS per patient if applicable:  GENERAL: alert, oriented, appears well and in no acute distress  HEENT: atraumatic, conjunttiva clear, no obvious abnormalities on inspection of external nose and ears  NECK: normal movements of the head and neck  LUNGS: on inspection no signs of respiratory distress, breathing rate appears normal, no obvious gross SOB, gasping or wheezing  CV: no obvious cyanosis  MS: moves all visible extremities without noticeable abnormality  PSYCH/NEURO: pleasant and cooperative, no obvious depression or anxiety, speech and thought processing grossly intact  ASSESSMENT AND PLAN:  Discussed the following assessment and plan:  1. Acute intractable headache, unspecified headache type -Scribe Medrol Dosepak and Zanaflex.  It could be from teeth grinding at night.  She was advised to continue to wear her mouthguard.  Advise follow-up if not improving in the next 2 to 3 days - methylPREDNISolone (MEDROL DOSEPAK) 4 MG TBPK tablet; Take as directed  Dispense: 21 tablet; Refill: 0 - tiZANidine (ZANAFLEX) 4 MG tablet; Take 1 tablet (4 mg total) by mouth at bedtime.  Dispense: 15 tablet; Refill: 0      I discussed the assessment and treatment plan with the patient. The patient was provided an opportunity to ask questions and all were answered. The patient agreed with the plan and demonstrated an understanding of the instructions.   The patient was advised to call back or seek an in-person evaluation if the symptoms worsen or if the condition fails to improve as anticipated.   Sydney Peng, NP

## 2021-10-18 ENCOUNTER — Encounter: Payer: Self-pay | Admitting: Adult Health

## 2021-10-26 ENCOUNTER — Other Ambulatory Visit: Payer: Self-pay | Admitting: Adult Health

## 2021-10-26 DIAGNOSIS — R519 Headache, unspecified: Secondary | ICD-10-CM

## 2021-10-29 IMAGING — DX DG CHEST 1V PORT
1 series · 1 of 1 positions shown · non-contrast
Comparison: Chest radiograph dated 12/22/2014.

CLINICAL DATA: 39-year-old female with headache and cough.

EXAM:
PORTABLE CHEST 1 VIEW

[chest ap]
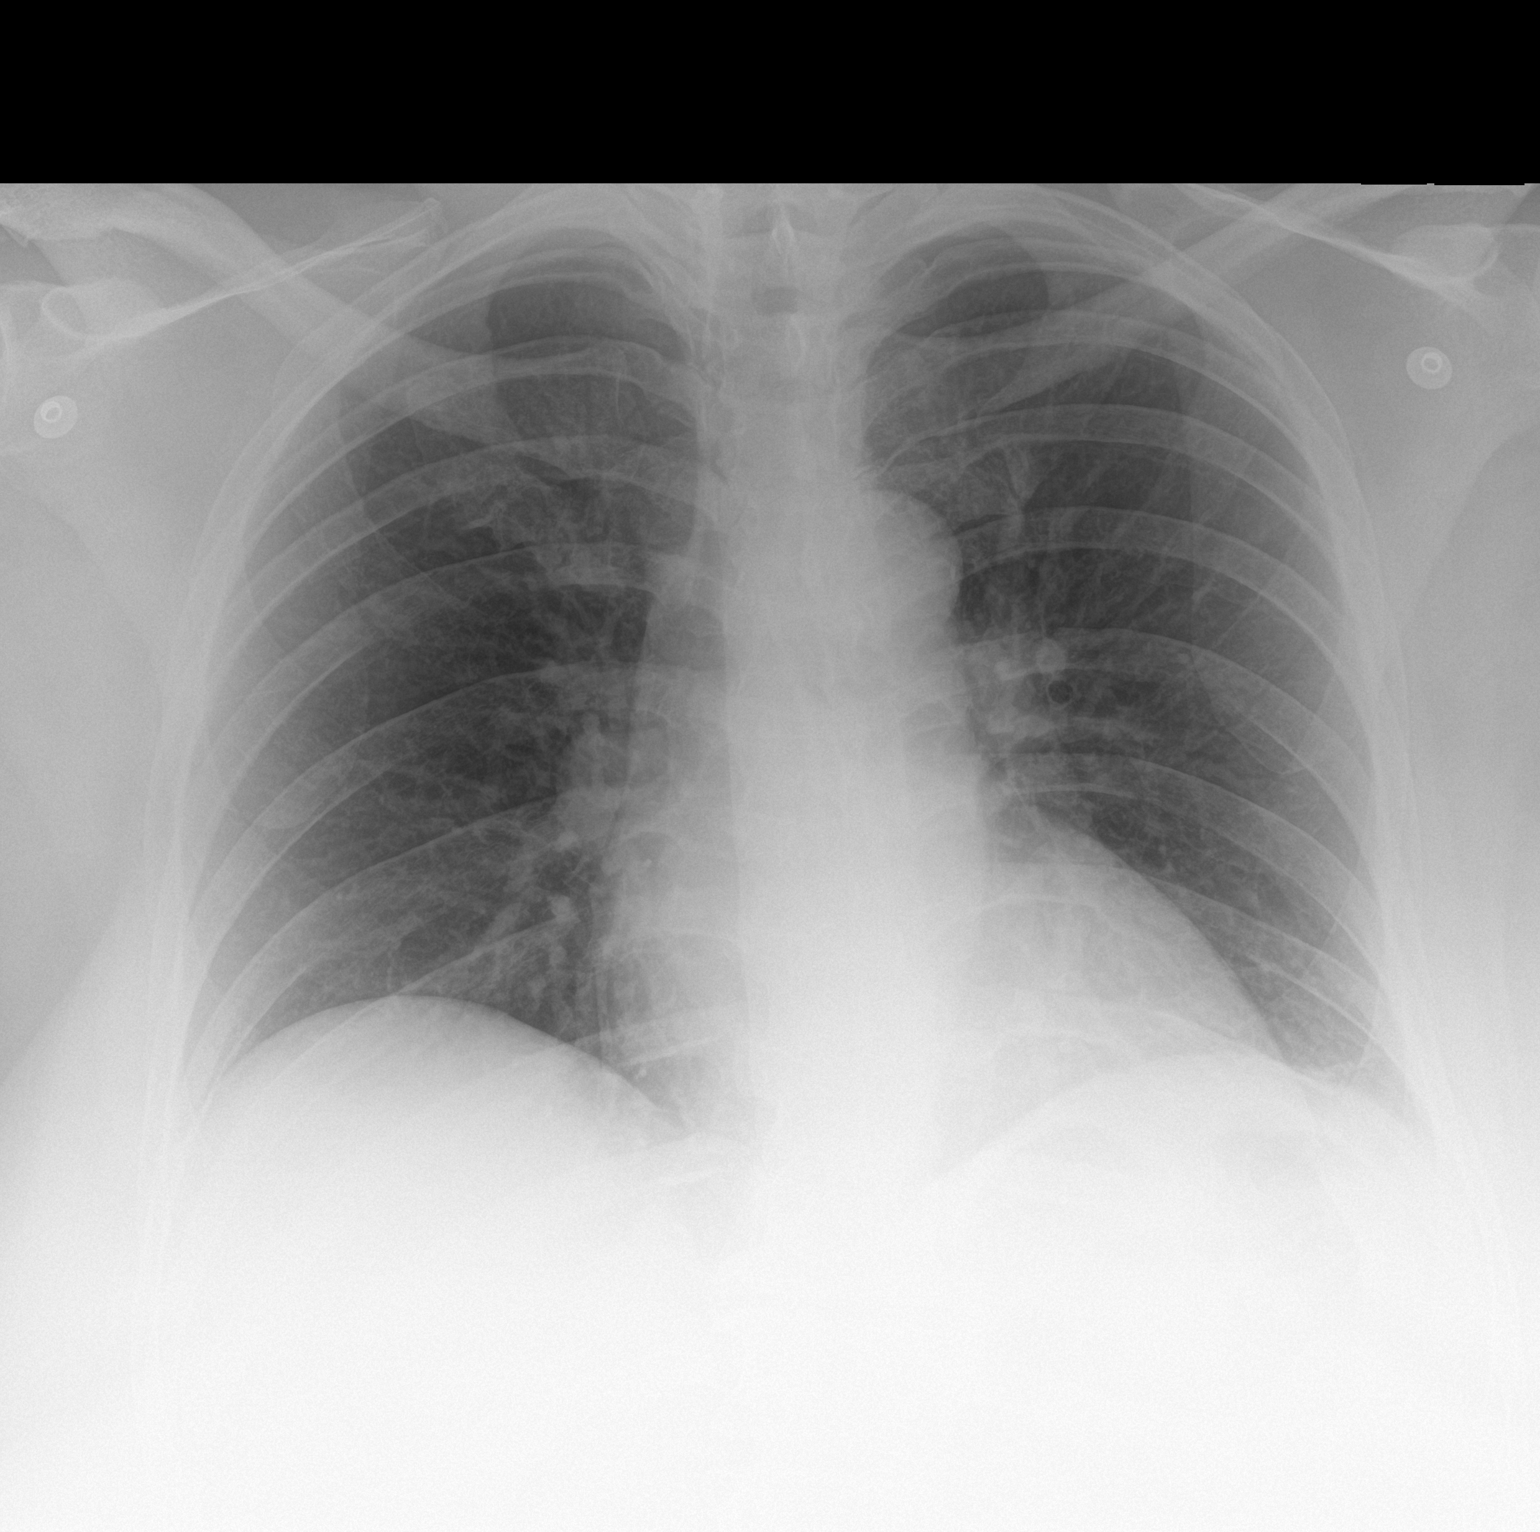

[1 of 1 positions shown; findings below may reference images not displayed]

FINDINGS: The heart size and mediastinal contours are within normal limits.
Both lungs are clear. The visualized skeletal structures are
unremarkable.
IMPRESSION: No active disease.

## 2021-11-25 ENCOUNTER — Ambulatory Visit: Payer: 59 | Admitting: Family Medicine

## 2021-11-25 ENCOUNTER — Encounter (HOSPITAL_BASED_OUTPATIENT_CLINIC_OR_DEPARTMENT_OTHER): Payer: Self-pay | Admitting: Emergency Medicine

## 2021-11-25 ENCOUNTER — Other Ambulatory Visit: Payer: Self-pay

## 2021-11-25 ENCOUNTER — Encounter: Payer: Self-pay | Admitting: Family Medicine

## 2021-11-25 VITALS — BP 140/88 | HR 105 | Temp 98.0°F | Ht 69.0 in | Wt 315.6 lb

## 2021-11-25 DIAGNOSIS — I10 Essential (primary) hypertension: Secondary | ICD-10-CM | POA: Insufficient documentation

## 2021-11-25 DIAGNOSIS — Z91010 Allergy to peanuts: Secondary | ICD-10-CM | POA: Insufficient documentation

## 2021-11-25 DIAGNOSIS — E860 Dehydration: Secondary | ICD-10-CM | POA: Insufficient documentation

## 2021-11-25 DIAGNOSIS — J029 Acute pharyngitis, unspecified: Secondary | ICD-10-CM | POA: Diagnosis not present

## 2021-11-25 DIAGNOSIS — Z91040 Latex allergy status: Secondary | ICD-10-CM | POA: Diagnosis not present

## 2021-11-25 DIAGNOSIS — Z79899 Other long term (current) drug therapy: Secondary | ICD-10-CM | POA: Insufficient documentation

## 2021-11-25 DIAGNOSIS — J45909 Unspecified asthma, uncomplicated: Secondary | ICD-10-CM | POA: Diagnosis not present

## 2021-11-25 DIAGNOSIS — J020 Streptococcal pharyngitis: Secondary | ICD-10-CM | POA: Insufficient documentation

## 2021-11-25 DIAGNOSIS — Z7951 Long term (current) use of inhaled steroids: Secondary | ICD-10-CM | POA: Diagnosis not present

## 2021-11-25 LAB — POC INFLUENZA A&B (BINAX/QUICKVUE)
Influenza A, POC: NEGATIVE
Influenza B, POC: NEGATIVE

## 2021-11-25 LAB — POC COVID19 BINAXNOW: SARS Coronavirus 2 Ag: NEGATIVE

## 2021-11-25 LAB — POCT RAPID STREP A (OFFICE): Rapid Strep A Screen: POSITIVE — AB

## 2021-11-25 MED ORDER — AZITHROMYCIN 250 MG PO TABS: ORAL_TABLET | ORAL | 0 refills | Status: AC

## 2021-11-25 NOTE — ED Triage Notes (Signed)
Patient recently dx strep throat. Given Zithromax. (PCP today) Feels "sick" C/o fatigue and heart racing. Productive cough. Head ache, body aches Started Sunday.  Took tylenol around 8pm.

## 2021-11-25 NOTE — Progress Notes (Signed)
Established Patient Office Visit  Subjective   Patient ID: Sydney Bradley, female    DOB: 08-01-1980  Age: 41 y.o. MRN: [id number]  Chief Complaint  Patient presents with   Sore Throat    Patient complains of sore throat, x2 days Tried Nyquil    Ear Pain    Patient complains of ear pain, x2 days    Cough    Patient complains of cough, Productive cough with yellow sputum, x2 days     HPI   Seen with sore throat and some body aches and headache which started Sunday.  Does have some productive cough.  Increased malaise.  No nausea or vomiting.  No sick contacts.  Possible low-grade fever intermittently.  No dyspnea.  She has reported allergy to penicillin from childhood.  No reported anaphylaxis.  Has tolerated Zithromax in the past.  Past Medical History:  Diagnosis Date   Arthritis    L5 Back - no meds - otc prn   Asthma    Crohn's disease of ileum ([place])    Hypertension    no meds x 3 yrs   IBS (irritable bowel syndrome)    Morbid obesity with BMI of 45.0-49.9, adult ([place])    Sacral fracture ([place])    Past Surgical History:  Procedure Laterality Date   COLONOSCOPY     DILATION AND EVACUATION  02/25/2012   Procedure: DILATATION AND EVACUATION;  Surgeon: Daria Pastures, MD;  Location: [facility];  Service: Gynecology;  Laterality: N/A;   DILATION AND EVACUATION N/A 03/01/2013   Procedure: DILATATION AND EVACUATION;  Surgeon: Olga Millers, MD;  Location: [facility];  Service: Gynecology;  Laterality: N/A;   TONSILLECTOMY AND ADENOIDECTOMY  2007    reports that she has never smoked. She has never used smokeless tobacco. She reports that she does not drink alcohol and does not use drugs. family history includes Alzheimer's disease in her maternal grandfather; COPD in her maternal grandmother; Cancer in her paternal grandmother; Colon cancer (age of onset: 62) in her father; Dementia in her maternal grandmother; Stroke in her paternal grandmother. Allergies  Allergen Reactions   Latex  Hives    Breathing problems   Other Anaphylaxis and Itching    Nuts Tree nuts   Peanut-Containing Drug Products Anaphylaxis    Allergy to all nuts    Shellfish Allergy Anaphylaxis   Ciprofibrate Hives   Penicillins Hives    Has patient had a PCN reaction causing immediate rash, facial/tongue/throat swelling, SOB or lightheadedness with hypotension: Yes Has patient had a PCN reaction causing severe rash involving mucus membranes or skin necrosis: No Has patient had a PCN reaction that required hospitalization No Has patient had a PCN reaction occurring within the last 10 years: No If all of the above answers are "NO", then may proceed with Cephalosporin use.    Chocolate Itching   Ciprofloxacin Hives   Lamisil [Terbinafine] Hives   Wheat Bran Itching    Review of Systems  Constitutional:  Positive for malaise/fatigue.  HENT:  Positive for sore throat. Negative for ear pain and sinus pain.   Respiratory:  Positive for cough.   Neurological:  Positive for headaches.      Objective:     BP (!) 140/88 (BP Location: Right Arm, Patient Position: Sitting, Cuff Size: Large)   Pulse (!) 105   Temp 98 F (36.7 C) (Oral)   Ht 5' 9"  (1.753 m)   Wt (!) 315 lb 9.6 oz (143.2 kg)  SpO2 100%   BMI 46.61 kg/m    Physical Exam Vitals reviewed.  Constitutional:      General: She is not in acute distress.    Appearance: She is well-developed. She is not toxic-appearing.  HENT:     Mouth/Throat:     Comments: Moderate posterior pharynx erythema without visible exudate. Cardiovascular:     Rate and Rhythm: Normal rate and regular rhythm.  Pulmonary:     Effort: Pulmonary effort is normal.     Breath sounds: Normal breath sounds. No wheezing or rales.  Musculoskeletal:     Cervical back: Neck supple.  Neurological:     Mental Status: She is alert.      Results for orders placed or performed in visit on 11/25/21  POC Rapid Strep A  Result Value Ref Range   Rapid Strep A  Screen Positive (A) Negative  POC Influenza A&B(BINAX/QUICKVUE)  Result Value Ref Range   Influenza A, POC Negative Negative   Influenza B, POC Negative Negative  POC COVID-19  Result Value Ref Range   SARS Coronavirus 2 Ag Negative Negative      The 10-year ASCVD risk score (Arnett DK, et al., 2019) is: 1.2%    Assessment & Plan:   Problem List Items Addressed This Visit   None Visit Diagnoses     Sore throat    -  Primary   Relevant Orders   POC Rapid Strep A (Completed)   POC Influenza A&B(BINAX/QUICKVUE) (Completed)   POC COVID-19 (Completed)     Strep pharyngitis.  COVID test negative.  Flu testing negative.  Rapid strep positive.  Patient was reported penicillin allergy.  No known anaphylaxis.  Treat with Zithromax for 5 days.  Follow-up for any persistent or worsening symptoms.  No follow-ups on file.    Carolann Littler, MD

## 2021-11-26 ENCOUNTER — Emergency Department (HOSPITAL_BASED_OUTPATIENT_CLINIC_OR_DEPARTMENT_OTHER)
Admission: EM | Admit: 2021-11-26 | Discharge: 2021-11-26 | Disposition: A | Discharge status: Home / Self Care | Payer: 59 | Attending: Emergency Medicine | Admitting: Emergency Medicine

## 2021-11-26 DIAGNOSIS — E860 Dehydration: Secondary | ICD-10-CM

## 2021-11-26 DIAGNOSIS — J020 Streptococcal pharyngitis: Secondary | ICD-10-CM

## 2021-11-26 MED ORDER — KETOROLAC TROMETHAMINE 15 MG/ML IJ SOLN
15.0000 mg | Freq: Once | INTRAMUSCULAR | Status: AC
Start: 2021-11-26 — End: 2021-11-26
  Administered 2021-11-26: 15 mg via INTRAVENOUS
  Filled 2021-11-26: qty 1

## 2021-11-26 MED ORDER — SODIUM CHLORIDE 0.9 % IV SOLN
500.0000 mg | Freq: Once | INTRAVENOUS | Status: AC
  Administered 2021-11-26: 500 mg via INTRAVENOUS
  Filled 2021-11-26: qty 5

## 2021-11-26 MED ORDER — SODIUM CHLORIDE 0.9 % IV BOLUS
1000.0000 mL | Freq: Once | INTRAVENOUS | Status: AC
  Administered 2021-11-26: 1000 mL via INTRAVENOUS

## 2021-11-26 MED ORDER — HYDROCODONE-ACETAMINOPHEN 7.5-325 MG/15ML PO SOLN: 10.0000 mL | ORAL | 0 refills | Status: DC | PRN

## 2021-11-26 NOTE — ED Provider Notes (Signed)
[facility] Provider Note: Georgena Spurling, MD, FACEP  CSN: [id number] MRN: [id number] ARRIVAL: 11/25/21 at 2341 ROOM: DB016/DB016   CHIEF COMPLAINT  Sore Throat   HISTORY OF PRESENT ILLNESS  11/26/21 1:15 AM Sydney Bradley is a 41 y.o. female who has been sick for the past 4 days.  Specifically she has had sore throat, fatigue, cough, headache, body aches and fatigue.  She has also felt her heart racing and she was noted to be tachycardic on arrival.  She was evaluated by her PCP yesterday and was found to be positive for strep.  She was negative for COVID and influenza.  She was started on Zithromax due to penicillin allergy, and took her first 500 mg dose yesterday.  She is supposed to take 250 mg daily for the next 4 days.  She rates the pain in her head as a 10 out of 10.  She has had decreased oral intake.   Past Medical History:  Diagnosis Date   Arthritis    L5 Back - no meds - otc prn   Asthma    Crohn's disease of ileum ([place])    Hypertension    no meds x 3 yrs   IBS (irritable bowel syndrome)    Morbid obesity with BMI of 45.0-49.9, adult ([place])    Sacral fracture ([place])     Past Surgical History:  Procedure Laterality Date   COLONOSCOPY     DILATION AND EVACUATION  02/25/2012   Procedure: DILATATION AND EVACUATION;  Surgeon: Daria Pastures, MD;  Location: [facility];  Service: Gynecology;  Laterality: N/A;   DILATION AND EVACUATION N/A 03/01/2013   Procedure: DILATATION AND EVACUATION;  Surgeon: Olga Millers, MD;  Location: [facility];  Service: Gynecology;  Laterality: N/A;   TONSILLECTOMY AND ADENOIDECTOMY  2007    Family History  Problem Relation Age of Onset   Colon cancer Father 6       mets to liver and bone died at 5   COPD Maternal Grandmother    Dementia Maternal Grandmother    Alzheimer's disease Maternal Grandfather    Cancer Paternal Grandmother        blood cancer?   Stroke Paternal Grandmother     Social History   Tobacco Use   Smoking  status: Never   Smokeless tobacco: Never  Vaping Use   Vaping Use: Never used  Substance Use Topics   Alcohol use: No    Alcohol/week: 0.0 standard drinks of alcohol   Drug use: No    Prior to Admission medications   Medication Sig Start Date End Date Taking? Authorizing Provider  albuterol (PROVENTIL) (2.5 MG/3ML) 0.083% nebulizer solution Take 3 mLs (2.5 mg total) by nebulization every 4 (four) hours as needed for wheezing or shortness of breath. 03/10/21   Burchette, Alinda Sierras, MD  albuterol (VENTOLIN HFA) 108 (90 Base) MCG/ACT inhaler Inhale 2 puffs into the lungs every 4 (four) hours as needed for wheezing. 08/03/18   Nafziger, Tommi Rumps, NP  ALPRAZolam Duanne Moron) 0.25 MG tablet TAKE ONE TABLET BY MOUTH TWICE DAILY AS NEEDED FOR ANXIETY 10/16/21   Nafziger, Tommi Rumps, NP  azithromycin (ZITHROMAX Z-PAK) 250 MG tablet Take 2 tablets (500 mg) on  Day 1,  followed by 1 tablet (250 mg) once daily on Days 2 through 5. 11/25/21 11/30/21  Burchette, Alinda Sierras, MD  buPROPion (WELLBUTRIN XL) 150 MG 24 hr tablet TAKE ONE TABLET BY MOUTH ONE TIME DAILY 09/15/21   [name], NP  cetirizine (  ZYRTEC) 10 MG tablet Take 10 mg by mouth daily.     [provider]  Clindamycin-Benzoyl Per, Refr, gel Apply topically. 04/10/21   [provider]  diphenhydrAMINE (BENADRYL) 25 MG tablet Take 25 mg by mouth as needed for allergies.     [provider]  ergocalciferol (VITAMIN D2) 1.25 MG (50000 UT) capsule Take by mouth. 12/05/18   [provider]  fluticasone (FLONASE) 50 MCG/ACT nasal spray Place 2 sprays into both nostrils daily. 01/11/18   Nafziger, Tommi Rumps, NP  HYDROcodone-acetaminophen (HYCET) 7.5-325 mg/15 ml solution Take 10 mLs by mouth every 4 (four) hours as needed for moderate pain or severe pain. 11/26/21   Philippa Vessey, MD  hydroquinone 4 % cream Apply 1 Dose topically daily.    [provider]  omeprazole (PRILOSEC) 40 MG capsule Take 1 capsule (40 mg total) by mouth daily  before breakfast. 08/14/21   Gatha Mayer, MD  phentermine 37.5 MG capsule Take 1 capsule (37.5 mg total) by mouth every morning. 08/25/21   Nafziger, Tommi Rumps, NP  tiZANidine (ZANAFLEX) 4 MG tablet Take 1 tablet (4 mg total) by mouth at bedtime. 10/15/21   Nafziger, Tommi Rumps, NP    Allergies Latex, Other, Peanut-containing drug products, Shellfish allergy, Ciprofibrate, Penicillins, Chocolate, Ciprofloxacin, Lamisil [terbinafine], and Wheat bran   REVIEW OF SYSTEMS  Negative except as noted here or in the History of Present Illness.   PHYSICAL EXAMINATION  Initial Vital Signs Blood pressure 130/86, pulse (!) 111, temperature 98.4 F (36.9 C), resp. rate (!) 22, last menstrual period 11/02/2021, SpO2 97 %.  Examination General: Well-developed, well-nourished female in no acute distress; appearance consistent with age of record HENT: normocephalic; atraumatic; pharyngeal erythema without exudate; no stridor; uvula midline Eyes: Normal appearance Neck: supple; anterior cervical lymphadenopathy Heart: regular rate and rhythm; tachycardia Lungs: clear to auscultation bilaterally Abdomen: soft; nondistended; nontender; bowel sounds present Extremities: No deformity; full range of motion; pulses normal Neurologic: Awake, alert and oriented; motor function intact in all extremities and symmetric; no facial droop Skin: Warm and dry Psychiatric: Flat affect   RESULTS  Summary of this visit's results, reviewed and interpreted by myself:   EKG Interpretation  Date/Time:  Wednesday November 25 2021 23:53:24 EDT Ventricular Rate:  112 PR Interval:  128 QRS Duration: 86 QT Interval:  292 QTC Calculation: 398 R Axis:   -10 Text Interpretation: Sinus tachycardia Nonspecific T wave abnormality Rate is faster Confirmed by Eyva Califano [phone]) on 11/25/2021 11:58:19 PM       Laboratory Studies: Results for orders placed or performed in visit on 11/25/21 (from the past 24 hour(s))  POC Rapid Strep  A     Status: Abnormal   Collection Time: 11/25/21  9:13 AM  Result Value Ref Range   Rapid Strep A Screen Positive (A) Negative  POC Influenza A&B(BINAX/QUICKVUE)     Status: Normal   Collection Time: 11/25/21  9:13 AM  Result Value Ref Range   Influenza A, POC Negative Negative   Influenza B, POC Negative Negative  POC COVID-19     Status: Normal   Collection Time: 11/25/21  9:13 AM  Result Value Ref Range   SARS Coronavirus 2 Ag Negative Negative   Imaging Studies: No results found.  ED COURSE and MDM  Nursing notes, initial and subsequent vitals signs, including pulse oximetry, reviewed and interpreted by myself.  Vitals:   11/26/21 0115 11/26/21 0130 11/26/21 0145 11/26/21 0200  BP: (!) 136/101 (!) 143/95 (!) 134/101 Marland Kitchen)  138/102  Pulse: 91 91 85 87  Resp:   20 18  Temp:      SpO2: 99% 99% 100% 100%   Medications  sodium chloride 0.9 % bolus 1,000 mL (1,000 mLs Intravenous New Bag/Given 11/26/21 0145)  azithromycin (ZITHROMAX) 500 mg in sodium chloride 0.9 % 250 mL IVPB (500 mg Intravenous New Bag/Given 11/26/21 0145)  ketorolac (TORADOL) 15 MG/ML injection 15 mg (15 mg Intravenous Given 11/26/21 0144)   The recommended dosage of Zithromax for strep pharyngitis is 500 mg daily.  We will give her 500 mg IV and have her skip today's oral dose, then have her take 500 mg daily for the next 2 days.  3:04 AM Tachycardia resolved after IV fluid bolus.  I suspect she was dehydrated from lack of oral intake.  Pain improved with IV Toradol.  PROCEDURES  Procedures   ED DIAGNOSES     ICD-10-CM   1. Strep pharyngitis  J02.0     2. Dehydration  E86.0          Quincie Haroon, Jenny Reichmann, MD 11/26/21 [id number]

## 2021-12-13 ENCOUNTER — Encounter: Payer: Self-pay | Admitting: Adult Health

## 2021-12-15 NOTE — Telephone Encounter (Signed)
Please advise 

## 2021-12-15 NOTE — Telephone Encounter (Signed)
Has been scheduled

## 2021-12-16 ENCOUNTER — Encounter: Payer: Self-pay | Admitting: Adult Health

## 2021-12-16 ENCOUNTER — Ambulatory Visit: Payer: 59 | Admitting: Adult Health

## 2021-12-16 VITALS — BP 120/82 | HR 78 | Temp 97.5°F | Ht 69.0 in | Wt 315.0 lb

## 2021-12-16 DIAGNOSIS — R5383 Other fatigue: Secondary | ICD-10-CM | POA: Diagnosis not present

## 2021-12-16 DIAGNOSIS — G479 Sleep disorder, unspecified: Secondary | ICD-10-CM

## 2021-12-16 DIAGNOSIS — J302 Other seasonal allergic rhinitis: Secondary | ICD-10-CM

## 2021-12-16 MED ORDER — MONTELUKAST SODIUM 10 MG PO TABS: 10.0000 mg | ORAL_TABLET | Freq: Every day | ORAL | 0 refills | Status: DC

## 2021-12-16 MED ORDER — METHYLPREDNISOLONE ACETATE 80 MG/ML IJ SUSP
80.0000 mg | Freq: Once | INTRAMUSCULAR | Status: AC
  Administered 2021-12-16: 80 mg via INTRAMUSCULAR

## 2021-12-16 MED ORDER — METHYLPREDNISOLONE ACETATE 40 MG/ML IJ SUSP
40.0000 mg | Freq: Once | INTRAMUSCULAR | Status: AC
  Administered 2021-12-16: 40 mg via INTRAMUSCULAR

## 2021-12-16 MED ORDER — METHYLPREDNISOLONE ACETATE 80 MG/ML IJ SUSP: 80.0000 mg | Freq: Once | INTRAMUSCULAR | Status: DC

## 2021-12-16 NOTE — Progress Notes (Signed)
Subjective:    Patient ID: Sydney Bradley, female    DOB: 08-Jul-1980, 41 y.o.   MRN: [id number]  HPI  41 year old female who  has a past medical history of Arthritis, Asthma, Crohn's disease of ileum ([place]), Hypertension, IBS (irritable bowel syndrome), Morbid obesity with BMI of 45.0-49.9, adult ([place]), and Sacral fracture ([place]).  She presents to the office today with a generalized complaint of "not feeling well".  She was diagnosed with an ear infection in July 2023 and then strep throat last month.  She reports that since then she just has "not felt well".  She reports every day she feels fatigued and has sore muscles and joints.  He has not had any fevers or chills.  No one Bradley in the home is sick.  He reports that her seasonal allergies have been extremely bad over the last couple months, she has been taking her allergy medication Zyrtec and Flonase without any relief.  She has had to have allergy injections in the past.  With this she feels as though she is not sleeping well at night, she has trouble falling asleep but once she is asleep she is able to sleep throughout the night.  When she wakes up she feels fatigued and could easily take an afternoon nap.  Her husband works third shift so she sure if she snores.  She does not believe she has had any apneic episodes.  Has not had any tick bites or red rash.  Review of Systems See HPI   Past Medical History:  Diagnosis Date   Arthritis    L5 Back - no meds - otc prn   Asthma    Crohn's disease of ileum ([facility])    Hypertension    no meds x 3 yrs   IBS (irritable bowel syndrome)    Morbid obesity with BMI of 45.0-49.9, adult ([facility])    Sacral fracture ([facility])     Social History   Socioeconomic History   Marital status: Married    Spouse name: Not on file   Number of children: 1   Years of education: 18   Highest education level: Master's degree (e.g., MA, MS, MEng, MEd, MSW, MBA)  Occupational History   Occupation: Education officer, museum     Employer: 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[place], foster care   Married    One biologic son Cornelia Copa) born 2016 and a stepson   One caffeinated beverage daily   Social Determinants of Health   Financial Resource Strain: Low Risk  (10/07/2021)   Overall Financial Resource Strain (CARDIA)    Difficulty of Paying Living Expenses: Not very hard  Food Insecurity: No Food Insecurity (10/07/2021)   Hunger Vital Sign    Worried About Running Out of Food in the Last Year: Never true    [place] in the Last Year: Never true  Transportation Needs: No Transportation Needs (10/07/2021)   PRAPARE - Transportation    Lack of Transportation (Medical): No    Lack  of Transportation (Non-Medical): No  Physical Activity: Unknown (10/07/2021)   Exercise Vital Sign    Days of Exercise per Week: 0 days    Minutes of Exercise per Session: Not on file  Stress: Stress Concern Present (10/07/2021)   [place]    Feeling of Stress : To some extent  Social Connections: Moderately Integrated (10/07/2021)   Social Connection and Isolation Panel [NHANES]    Frequency of Communication with Friends and Family: More than three times a week    Frequency of Social Gatherings with Friends and Family: Once a week    Attends Religious Services: More than 4 times per year    Active Member of [employer] or Organizations: No    Attends Music therapist: Not on file    Marital Status: Married  Human resources officer Violence: Not on file    Past Surgical  History:  Procedure Laterality Date   COLONOSCOPY     DILATION AND EVACUATION  02/25/2012   Procedure: [place];  Surgeon: Daria Pastures, MD;  Location: [facility];  Service: Gynecology;  Laterality: N/A;   DILATION AND EVACUATION N/A 03/01/2013   Procedure: DILATATION AND EVACUATION;  Surgeon: Olga Millers, MD;  Location: [facility];  Service: Gynecology;  Laterality: N/A;   TONSILLECTOMY AND ADENOIDECTOMY  2007    Family History  Problem Relation Age of Onset   Colon cancer Father 32       mets to liver and bone died at 30   COPD Maternal Grandmother    Dementia Maternal Grandmother    Alzheimer's disease Maternal Grandfather    Cancer Paternal Grandmother        blood cancer?   Stroke Paternal Grandmother     Allergies  Allergen Reactions   Latex Hives    Breathing problems   Other Anaphylaxis and Itching    Nuts Tree nuts   Peanut-Containing Drug Products Anaphylaxis    Allergy to all nuts    Shellfish Allergy Anaphylaxis   Ciprofibrate Hives   Penicillins Hives    Has patient had a PCN reaction causing immediate rash, facial/tongue/throat swelling, SOB or lightheadedness with hypotension: Yes Has patient had a PCN reaction causing severe rash involving mucus membranes or skin necrosis: No Has patient had a PCN reaction that required hospitalization No Has patient had a PCN reaction occurring within the last 10 years: No If all of the above answers are "NO", then may proceed with Cephalosporin use.    Chocolate Itching   Ciprofloxacin Hives   Lamisil [Terbinafine] Hives   Wheat Bran Itching    Current Outpatient Medications on File Prior to Visit  Medication Sig Dispense Refill   albuterol (PROVENTIL) (2.5 MG/3ML) 0.083% nebulizer solution Take 3 mLs (2.5 mg total) by nebulization every 4 (four) hours as needed for wheezing or shortness of breath. 75 mL 0   albuterol (VENTOLIN HFA) 108 (90 Base) MCG/ACT inhaler Inhale 2 puffs into the lungs every  4 (four) hours as needed for wheezing. 1 Inhaler 3   ALPRAZolam (XANAX) 0.25 MG tablet TAKE ONE TABLET BY MOUTH TWICE DAILY AS NEEDED FOR ANXIETY 60 tablet 0   buPROPion (WELLBUTRIN XL) 150 MG 24 hr tablet TAKE ONE TABLET BY MOUTH ONE TIME DAILY 90 tablet 1   cetirizine (ZYRTEC) 10 MG tablet Take 10 mg by mouth daily.      Clindamycin-Benzoyl Per, Refr, gel Apply topically.     diphenhydrAMINE (BENADRYL) 25 MG  tablet Take 25 mg by mouth as needed for allergies.      fluticasone (FLONASE) 50 MCG/ACT nasal spray Place 2 sprays into both nostrils daily. 16 g 6   HYDROcodone-acetaminophen (HYCET) 7.5-325 mg/15 ml solution Take 10 mLs by mouth every 4 (four) hours as needed for moderate pain or severe pain. 118 mL 0   hydroquinone 4 % cream Apply 1 Dose topically daily.     omeprazole (PRILOSEC) 40 MG capsule Take 1 capsule (40 mg total) by mouth daily before breakfast. 90 capsule 0   No current facility-administered medications on file prior to visit.    BP 120/82   Pulse 78   Temp (!) 97.5 F (36.4 C) (Oral)   Ht 5' 9"  (1.753 m)   Wt (!) 315 lb (142.9 kg)   LMP 11/02/2021 (Approximate)   SpO2 100%   BMI 46.52 kg/m       Objective:   Physical Exam Vitals and nursing note reviewed.  Constitutional:      Appearance: Normal appearance.  HENT:     Nose: Congestion and rhinorrhea present. Rhinorrhea is clear.     Right Turbinates: Enlarged.     Left Turbinates: Enlarged.     Right Sinus: No maxillary sinus tenderness or frontal sinus tenderness.     Left Sinus: No maxillary sinus tenderness or frontal sinus tenderness.     Mouth/Throat:     Mouth: Mucous membranes are moist.     Pharynx: Oropharynx is clear. Uvula midline.  Cardiovascular:     Rate and Rhythm: Normal rate and regular rhythm.     Pulses: Normal pulses.     Heart sounds: Normal heart sounds.  Pulmonary:     Effort: Pulmonary effort is normal.     Breath sounds: Normal breath sounds.  Musculoskeletal:         General: Normal range of motion.  Skin:    General: Skin is warm and dry.     Capillary Refill: Capillary refill takes less than 2 seconds.  Neurological:     General: No focal deficit present.     Mental Status: She is alert and oriented to person, place, and time.  Psychiatric:        Mood and Affect: Mood normal.        Behavior: Behavior normal.        Thought Content: Thought content normal.        Judgment: Judgment normal.        Assessment & Plan:  1. Fatigue, unspecified type - Unknown cause at this time. Could be stress from job, sleep apnea, seasonal allergies or many other causes. No signs of an acute illness - DG Chest 2 View; Future - CBC with Differential/Platelet; Future - Basic Metabolic Panel; Future - Sedimentation Rate; Future - IBC + Ferritin; Future - VITAMIN D 25 Hydroxy (Vit-D Deficiency, Fractures); Future - Vitamin B12; Future - TSH; Future - Ambulatory referral to Pulmonology  2. Seasonal allergies - Will trial her on singulair and give her a depo medrol injection to help with her symptoms  - montelukast (SINGULAIR) 10 MG tablet; Take 1 tablet (10 mg total) by mouth at bedtime.  Dispense: 90 tablet; Refill: 0 - methylPREDNISolone acetate (DEPO-MEDROL) injection 40 mg - methylPREDNISolone acetate (DEPO-MEDROL) injection 80 mg  3. Sleep disturbance - concern for sleep apnea. Will check labs and refer to pulmonary for sleep study  - DG Chest 2 View; Future - CBC with Differential/Platelet; Future - Basic Metabolic Panel; Future -  Sedimentation Rate; Future - IBC + Ferritin; Future - VITAMIN D 25 Hydroxy (Vit-D Deficiency, Fractures); Future - Vitamin B12; Future - TSH; Future - Ambulatory referral to Pulmonology  Time spent with patient today was 45 minutes which consisted of chart review, discussing fatigue, sleep apnea, and seasonal llergies  work up, treatment answering questions and documentation.

## 2021-12-16 NOTE — Patient Instructions (Signed)
It was great seeing you today   We will follow up with you regarding your xray and labs   Someone will call you for your sleep study  I have also sent in Singulair for your seasonal allergies

## 2021-12-17 ENCOUNTER — Other Ambulatory Visit: Payer: 59

## 2021-12-18 ENCOUNTER — Other Ambulatory Visit (INDEPENDENT_AMBULATORY_CARE_PROVIDER_SITE_OTHER): Payer: 59

## 2021-12-18 ENCOUNTER — Ambulatory Visit (INDEPENDENT_AMBULATORY_CARE_PROVIDER_SITE_OTHER): Payer: 59

## 2021-12-18 DIAGNOSIS — R5383 Other fatigue: Secondary | ICD-10-CM

## 2021-12-18 DIAGNOSIS — G479 Sleep disorder, unspecified: Secondary | ICD-10-CM

## 2021-12-18 LAB — VITAMIN D 25 HYDROXY (VIT D DEFICIENCY, FRACTURES): VITD: 21.48 ng/mL — ABNORMAL LOW (ref 30.00–100.00)

## 2021-12-18 LAB — BASIC METABOLIC PANEL
BUN: 10 mg/dL (ref 6–23)
CO2: 26 mEq/L (ref 19–32)
Calcium: 9 mg/dL (ref 8.4–10.5)
Chloride: 104 mEq/L (ref 96–112)
Creatinine, Ser: 0.92 mg/dL (ref 0.40–1.20)
GFR: 76.76 mL/min (ref >60.00)
Glucose, Bld: 100 mg/dL — ABNORMAL HIGH (ref 70–99)
Potassium: 3.8 mEq/L (ref 3.5–5.1)
Sodium: 138 mEq/L (ref 135–145)

## 2021-12-18 LAB — CBC WITH DIFFERENTIAL/PLATELET
Basophils Absolute: 0.1 10*3/uL (ref 0.0–0.1)
Basophils Relative: 1.1 % (ref 0.0–3.0)
Eosinophils Absolute: 0.2 10*3/uL (ref 0.0–0.7)
Eosinophils Relative: 3.2 % (ref 0.0–5.0)
HCT: 39 % (ref 36.0–46.0)
Hemoglobin: 12.8 g/dL (ref 12.0–15.0)
Lymphocytes Relative: 37.6 % (ref 12.0–46.0)
Lymphs Abs: 2.3 10*3/uL (ref 0.7–4.0)
MCHC: 32.8 g/dL (ref 30.0–36.0)
MCV: 82.9 fl (ref 78.0–100.0)
Monocytes Absolute: 0.5 10*3/uL (ref 0.1–1.0)
Monocytes Relative: 7.8 % (ref 3.0–12.0)
Neutro Abs: 3.1 10*3/uL (ref 1.4–7.7)
Neutrophils Relative %: 50.3 % (ref 43.0–77.0)
Platelets: 244 10*3/uL (ref 150.0–400.0)
RBC: 4.7 Mil/uL (ref 3.87–5.11)
RDW: 13.3 % (ref 11.5–15.5)
WBC: 6.2 10*3/uL (ref 4.0–10.5)

## 2021-12-18 LAB — IBC + FERRITIN
Ferritin: 77.6 ng/mL (ref 10.0–291.0)
Iron: 74 ug/dL (ref 42–145)
Saturation Ratios: 25.5 % (ref 20.0–50.0)
TIBC: 289.8 ug/dL (ref 250.0–450.0)
Transferrin: 207 mg/dL — ABNORMAL LOW (ref 212.0–360.0)

## 2021-12-18 LAB — SEDIMENTATION RATE: Sed Rate: 48 mm/hr — ABNORMAL HIGH (ref 0–20)

## 2021-12-18 LAB — TSH: TSH: 2.32 u[IU]/mL (ref 0.35–5.50)

## 2021-12-18 LAB — VITAMIN B12: Vitamin B-12: 223 pg/mL (ref 211–911)

## 2021-12-24 ENCOUNTER — Other Ambulatory Visit: Payer: Self-pay

## 2021-12-24 MED ORDER — VITAMIN D (ERGOCALCIFEROL) 1.25 MG (50000 UNIT) PO CAPS: 50000.0000 [IU] | ORAL_CAPSULE | ORAL | 0 refills | Status: DC

## 2021-12-29 ENCOUNTER — Other Ambulatory Visit: Payer: Self-pay | Admitting: Internal Medicine

## 2022-01-14 ENCOUNTER — Encounter: Payer: Self-pay | Admitting: Adult Health

## 2022-01-14 NOTE — Telephone Encounter (Signed)
Noted  

## 2022-01-19 ENCOUNTER — Telehealth (INDEPENDENT_AMBULATORY_CARE_PROVIDER_SITE_OTHER): Payer: 59 | Admitting: Adult Health

## 2022-01-19 DIAGNOSIS — E668 Other obesity: Secondary | ICD-10-CM

## 2022-01-19 DIAGNOSIS — M5441 Lumbago with sciatica, right side: Secondary | ICD-10-CM | POA: Diagnosis not present

## 2022-01-19 MED ORDER — CYCLOBENZAPRINE HCL 10 MG PO TABS: 10.0000 mg | ORAL_TABLET | Freq: Three times a day (TID) | ORAL | 0 refills | Status: DC | PRN

## 2022-01-19 MED ORDER — PHENTERMINE HCL 15 MG PO CAPS: 15.0000 mg | ORAL_CAPSULE | ORAL | 0 refills | Status: DC

## 2022-01-19 MED ORDER — METHYLPREDNISOLONE 4 MG PO TBPK: ORAL_TABLET | ORAL | 0 refills | Status: DC

## 2022-01-19 NOTE — Progress Notes (Signed)
Virtual Visit via Video Note  I connected with Sydney Bradley  on 01/19/22 at 10:45 AM EDT by a video enabled telemedicine application and verified that I am speaking with the correct person using two identifiers.  Location patient: home Location provider:work or home office Persons participating in the virtual visit: patient, provider  I discussed the limitations of evaluation and management by telemedicine and the availability of in person appointments. The patient expressed understanding and agreed to proceed.   HPI: 41 year old female who is being evaluated today for multiple issues.  's issue is that of low back pain with radiating pain down the outside of her right leg.  She reports that she "threw out her back" while mopping the floor 4 to 5 days ago.  The next day she was not able to get out of bed.  She reports pain that radiates down the outside of her right leg but feels as a stabbing pain and pain in her lower back that feels more muscular.  She feels spasms in her back  This generally, she is interested in going back on phentermine.  She has been on phentermine in the past and did well with this medication.  Unfortunately her insurance does not cover any of the newer weight loss medication unless she is diabetic.  She has thought about bariatric surgery in the past but is apprehensive about it due long-term issues that bariatric surgery may cause.  She has spoken with [facility] and is thinking about speaking with [facility].  ROS: See pertinent positives and negatives per HPI.  Past Medical History:  Diagnosis Date   Arthritis    L5 Back - no meds - otc prn   Asthma    Crohn's disease of ileum ([place])    Hypertension    no meds x 3 yrs   IBS (irritable bowel syndrome)    Morbid obesity with BMI of 45.0-49.9, adult ([place])    Sacral fracture ([place])     Past Surgical History:  Procedure Laterality Date   COLONOSCOPY     DILATION AND EVACUATION  02/25/2012   Procedure:  DILATATION AND EVACUATION;  Surgeon: Daria Pastures, MD;  Location: [facility];  Service: Gynecology;  Laterality: N/A;   DILATION AND EVACUATION N/A 03/01/2013   Procedure: DILATATION AND EVACUATION;  Surgeon: Olga Millers, MD;  Location: [facility];  Service: Gynecology;  Laterality: N/A;   TONSILLECTOMY AND ADENOIDECTOMY  2007    Family History  Problem Relation Age of Onset   Colon cancer Father 14       mets to liver and bone died at 84   COPD Maternal Grandmother    Dementia Maternal Grandmother    Alzheimer's disease Maternal Grandfather    Cancer Paternal Grandmother        blood cancer?   Stroke Paternal Grandmother        Current Outpatient Medications:    albuterol (PROVENTIL) (2.5 MG/3ML) 0.083% nebulizer solution, Take 3 mLs (2.5 mg total) by nebulization every 4 (four) hours as needed for wheezing or shortness of breath., Disp: 75 mL, Rfl: 0   albuterol (VENTOLIN HFA) 108 (90 Base) MCG/ACT inhaler, Inhale 2 puffs into the lungs every 4 (four) hours as needed for wheezing., Disp: 1 Inhaler, Rfl: 3   ALPRAZolam (XANAX) 0.25 MG tablet, TAKE ONE TABLET BY MOUTH TWICE DAILY AS NEEDED FOR ANXIETY, Disp: 60 tablet, Rfl: 0   buPROPion (WELLBUTRIN XL) 150 MG 24 hr tablet, TAKE ONE TABLET BY MOUTH  ONE TIME DAILY, Disp: 90 tablet, Rfl: 1   cetirizine (ZYRTEC) 10 MG tablet, Take 10 mg by mouth daily. , Disp: , Rfl:    Clindamycin-Benzoyl Per, Refr, gel, Apply topically., Disp: , Rfl:    cyclobenzaprine (FLEXERIL) 10 MG tablet, Take 1 tablet (10 mg total) by mouth 3 (three) times daily as needed for muscle spasms., Disp: 30 tablet, Rfl: 0   diphenhydrAMINE (BENADRYL) 25 MG tablet, Take 25 mg by mouth as needed for allergies. , Disp: , Rfl:    fluticasone (FLONASE) 50 MCG/ACT nasal spray, Place 2 sprays into both nostrils daily., Disp: 16 g, Rfl: 6   HYDROcodone-acetaminophen (HYCET) 7.5-325 mg/15 ml solution, Take 10 mLs by mouth every 4 (four) hours as needed for moderate pain or  severe pain., Disp: 118 mL, Rfl: 0   hydroquinone 4 % cream, Apply 1 Dose topically daily., Disp: , Rfl:    methylPREDNISolone (MEDROL DOSEPAK) 4 MG TBPK tablet, Take as directed, Disp: 21 tablet, Rfl: 0   montelukast (SINGULAIR) 10 MG tablet, Take 1 tablet (10 mg total) by mouth at bedtime., Disp: 90 tablet, Rfl: 0   omeprazole (PRILOSEC) 40 MG capsule, TAKE ONE CAPSULE BY MOUTH DAILY BEFORE BREAKFAST, Disp: 90 capsule, Rfl: 1   phentermine 15 MG capsule, Take 1 capsule (15 mg total) by mouth every morning., Disp: 30 capsule, Rfl: 0   Vitamin D, Ergocalciferol, (DRISDOL) 1.25 MG (50000 UNIT) CAPS capsule, Take 1 capsule (50,000 Units total) by mouth every 7 (seven) days., Disp: 12 capsule, Rfl: 0  EXAM:  VITALS per patient if applicable:  GENERAL: alert, oriented, appears well and in no acute distress  HEENT: atraumatic, conjunttiva clear, no obvious abnormalities on inspection of external nose and ears  NECK: normal movements of the head and neck  LUNGS: on inspection no signs of respiratory distress, breathing rate appears normal, no obvious gross SOB, gasping or wheezing  CV: no obvious cyanosis  MS: moves all visible extremities without noticeable abnormality  PSYCH/NEURO: pleasant and cooperative, no obvious depression or anxiety, speech and thought processing grossly intact  ASSESSMENT AND PLAN:  Discussed the following assessment and plan:  Other obesity - Plan: phentermine 15 MG capsule  Acute midline low back pain with right-sided sciatica - Plan: cyclobenzaprine (FLEXERIL) 10 MG tablet, methylPREDNISolone (MEDROL DOSEPAK) 4 MG TBPK tablet     I discussed the assessment and treatment plan with the patient. The patient was provided an opportunity to ask questions and all were answered. The patient agreed with the plan and demonstrated an understanding of the instructions.   The patient was advised to call back or seek an in-person evaluation if the symptoms worsen or if  the condition fails to improve as anticipated.   Sydney Peng, NP

## 2022-01-22 NOTE — Telephone Encounter (Signed)
Please advise 

## 2022-01-29 ENCOUNTER — Other Ambulatory Visit: Payer: Self-pay | Admitting: Adult Health

## 2022-01-29 MED ORDER — TOPIRAMATE 25 MG PO TABS: 25.0000 mg | ORAL_TABLET | Freq: Every day | ORAL | 0 refills | Status: DC

## 2022-02-18 ENCOUNTER — Other Ambulatory Visit: Payer: Self-pay | Admitting: Adult Health

## 2022-02-18 DIAGNOSIS — E668 Other obesity: Secondary | ICD-10-CM

## 2022-02-18 MED ORDER — PHENTERMINE HCL 15 MG PO CAPS: 15.0000 mg | ORAL_CAPSULE | ORAL | 1 refills | Status: DC

## 2022-03-17 ENCOUNTER — Other Ambulatory Visit: Payer: Self-pay | Admitting: Adult Health

## 2022-03-17 DIAGNOSIS — F419 Anxiety disorder, unspecified: Secondary | ICD-10-CM

## 2022-03-22 ENCOUNTER — Other Ambulatory Visit: Payer: Self-pay | Admitting: Adult Health

## 2022-03-22 DIAGNOSIS — F419 Anxiety disorder, unspecified: Secondary | ICD-10-CM

## 2022-03-23 NOTE — Telephone Encounter (Signed)
Okay for refill?  

## 2022-03-29 ENCOUNTER — Encounter: Payer: Self-pay | Admitting: Adult Health

## 2022-03-30 NOTE — Telephone Encounter (Signed)
Ok to schedule pt for a follow up visit?

## 2022-04-13 ENCOUNTER — Ambulatory Visit: Payer: 59 | Admitting: Adult Health

## 2022-04-13 ENCOUNTER — Encounter: Payer: Self-pay | Admitting: Adult Health

## 2022-04-13 VITALS — BP 130/80 | HR 76 | Temp 98.5°F | Ht 69.0 in | Wt 322.0 lb

## 2022-04-13 DIAGNOSIS — E668 Other obesity: Secondary | ICD-10-CM

## 2022-04-13 MED ORDER — TOPIRAMATE 50 MG PO TABS: 50.0000 mg | ORAL_TABLET | Freq: Two times a day (BID) | ORAL | 0 refills | Status: DC

## 2022-04-13 MED ORDER — PHENTERMINE HCL 30 MG PO CAPS: 30.0000 mg | ORAL_CAPSULE | ORAL | 2 refills | Status: DC

## 2022-04-13 NOTE — Progress Notes (Signed)
Subjective:    Patient ID: Sydney Bradley, female    DOB: 1980/10/14, 42 y.o.   MRN: [id number]  HPI  42 year old female who  has a past medical history of Arthritis, Asthma, Crohn's disease of ileum ([place]), Hypertension, IBS (irritable bowel syndrome), Morbid obesity with BMI of 45.0-49.9, adult ([place]), and Sacral fracture ([place]).  Presents to the office today for follow-up regarding weight loss management.  Currently maintained on phentermine 15 mg and topamax 25 mg.  In the past her insurance did not provide coverage for any of the newer weight loss medication such as [id number]. She reports that the phentermine over the last few months has not decreased her cravings especially with sweets, she will get a milk shake nearly every day. 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 Thankfully she got a promotion and is not in the field as much as will not be eating as much fast food.  Wt Readings from Last 3 Encounters:  04/13/22 (!) 322 lb (146.1 kg)  12/16/21 (!) 315 lb (142.9 kg)  11/25/21 (!) 315 lb 9.6 oz (143.2 kg)   Review of Systems See HPI   Past Medical History:  Diagnosis Date   Arthritis    L5 Back - no meds - otc prn   Asthma    Crohn's disease of ileum ([facility])    Hypertension    no meds x 3 yrs   IBS (irritable bowel syndrome)    Morbid obesity with BMI of 45.0-49.9, adult ([facility])    Sacral fracture ([place])     Social History   Socioeconomic History   Marital status: Married    Spouse name: Not on file   Number of children: 1   Years of education: 18   Highest education level: Master's degree (e.g., MA, MS, MEng, MEd, MSW, MBA)  Occupational History   Occupation: Education officer, museum    Employer: 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[place], foster care   Married    One biologic son Cornelia Copa) born 2016 and a stepson   One caffeinated beverage daily   Social Determinants of Health   Financial Resource Strain: Low Risk  (10/07/2021)   Overall Financial Resource Strain (CARDIA)    Difficulty of Paying Living Expenses: Not very hard  Food Insecurity: No Food Insecurity (10/07/2021)   Hunger Vital Sign    Worried About Running Out of Food in the Last Year: Never true    Ran Out of Food in the Last Year: Never true  Transportation Needs: No Transportation Needs (10/07/2021)   PRAPARE - Hydrologist (Medical): No    Lack of Transportation (Non-Medical): No  Physical Activity: Unknown (10/07/2021)   Exercise Vital Sign    Days of Exercise per Week: 0 days    Minutes of Exercise per Session: Not on file  Stress: Stress Concern Present (10/07/2021)   [place]  Feeling of Stress : To some extent  Social Connections: Moderately Integrated (10/07/2021)   Social Connection and Isolation Panel [NHANES]    Frequency of Communication with Friends and Family: More than three times a week    Frequency of Social Gatherings with Friends and Family: Once a week    Attends Religious Services: More than 4 times per year    Active Member of [employer] or Organizations: No    Attends Music therapist: Not on file    Marital Status: Married  Human resources officer Violence: Not on file    Past Surgical History:  Procedure Laterality Date   COLONOSCOPY     DILATION AND EVACUATION  02/25/2012   Procedure: [place];  Surgeon: Daria Pastures, MD;  Location: [facility];  Service: Gynecology;  Laterality: N/A;   DILATION AND EVACUATION N/A  03/01/2013   Procedure: DILATATION AND EVACUATION;  Surgeon: Olga Millers, MD;  Location: [facility];  Service: Gynecology;  Laterality: N/A;   TONSILLECTOMY AND ADENOIDECTOMY  2007    Family History  Problem Relation Age of Onset   Colon cancer Father 6       mets to liver and bone died at 28   COPD Maternal Grandmother    Dementia Maternal Grandmother    Alzheimer's disease Maternal Grandfather    Cancer Paternal Grandmother        blood cancer?   Stroke Paternal Grandmother     Allergies  Allergen Reactions   Latex Hives    Breathing problems   Other Anaphylaxis and Itching    Nuts Tree nuts   Peanut-Containing Drug Products Anaphylaxis    Allergy to all nuts    Shellfish Allergy Anaphylaxis   Ciprofibrate Hives   Penicillins Hives    Has patient had a PCN reaction causing immediate rash, facial/tongue/throat swelling, SOB or lightheadedness with hypotension: Yes Has patient had a PCN reaction causing severe rash involving mucus membranes or skin necrosis: No Has patient had a PCN reaction that required hospitalization No Has patient had a PCN reaction occurring within the last 10 years: No If all of the above answers are "NO", then may proceed with Cephalosporin use.    Chocolate Itching   Ciprofloxacin Hives   Lamisil [Terbinafine] Hives   Wheat Bran Itching    Current Outpatient Medications on File Prior to Visit  Medication Sig Dispense Refill   albuterol (PROVENTIL) (2.5 MG/3ML) 0.083% nebulizer solution Take 3 mLs (2.5 mg total) by nebulization every 4 (four) hours as needed for wheezing or shortness of breath. 75 mL 0   albuterol (VENTOLIN HFA) 108 (90 Base) MCG/ACT inhaler Inhale 2 puffs into the lungs every 4 (four) hours as needed for wheezing. 1 Inhaler 3   ALPRAZolam (XANAX) 0.25 MG tablet TAKE ONE TABLET BY MOUTH TWICE DAILY AS NEEDED FOR ANXIETY 60 tablet 1   buPROPion (WELLBUTRIN XL) 150 MG 24 hr tablet TAKE ONE TABLET BY MOUTH ONE TIME DAILY 90 tablet  0   cetirizine (ZYRTEC) 10 MG tablet Take 10 mg by mouth daily.      Clindamycin-Benzoyl Per, Refr, gel Apply topically.     cyclobenzaprine (FLEXERIL) 10 MG tablet Take 1 tablet (10 mg total) by mouth 3 (three) times daily as needed for muscle spasms. 30 tablet 0   diphenhydrAMINE (BENADRYL) 25 MG tablet Take 25 mg by mouth as needed for allergies.      fluticasone (FLONASE) 50 MCG/ACT nasal spray Place 2 sprays into both nostrils daily.  16 g 6   hydroquinone 4 % cream Apply 1 Dose topically daily.     montelukast (SINGULAIR) 10 MG tablet Take 1 tablet (10 mg total) by mouth at bedtime. 90 tablet 0   omeprazole (PRILOSEC) 40 MG capsule TAKE ONE CAPSULE BY MOUTH DAILY BEFORE BREAKFAST 90 capsule 1   phentermine 15 MG capsule Take 1 capsule (15 mg total) by mouth every morning. 30 capsule 1   topiramate (TOPAMAX) 25 MG tablet Take 1 tablet (25 mg total) by mouth at bedtime. 90 tablet 0   Vitamin D, Ergocalciferol, (DRISDOL) 1.25 MG (50000 UNIT) CAPS capsule Take 1 capsule (50,000 Units total) by mouth every 7 (seven) days. 12 capsule 0   No current facility-administered medications on file prior to visit.    BP 130/80   Pulse 76   Temp 98.5 F (36.9 C) (Oral)   Ht 5\' 9"  (1.753 m)   Wt (!) 322 lb (146.1 kg)   SpO2 96%   BMI 47.55 kg/m       Objective:   Physical Exam Vitals and nursing note reviewed.  Constitutional:      Appearance: Normal appearance. She is obese.  Cardiovascular:     Rate and Rhythm: Normal rate and regular rhythm.  Musculoskeletal:        General: Normal range of motion.  Skin:    General: Skin is warm and dry.     Capillary Refill: Capillary refill takes less than 2 seconds.  Neurological:     General: No focal deficit present.     Mental Status: She is alert and oriented to person, place, and time.  Psychiatric:        Mood and Affect: Mood normal.        Behavior: Behavior normal.        Thought Content: Thought content normal.        Judgment:  Judgment normal.       Assessment & Plan:   1. Other obesity - will try increasing her phentermine dose and topamax and see how she does over the next month  - will have her try Noom  - Follow up in 30 days  - phentermine 30 MG capsule; Take 1 capsule (30 mg total) by mouth every morning.  Dispense: 30 capsule; Refill: 2 - topiramate (TOPAMAX) 50 MG tablet; Take 1 tablet (50 mg total) by mouth 2 (two) times daily.  Dispense: 90 tablet; Refill: 0   [name], NP   Time spent with patient today was 31 minutes which consisted of chart review, discussing diagnosis, work up, treatment answering questions and documentation.

## 2022-04-13 NOTE — Patient Instructions (Signed)
I am going to increase your phentermine to 30 mg. Follow up in 30 days

## 2022-04-18 ENCOUNTER — Encounter: Payer: Self-pay | Admitting: Adult Health

## 2022-04-19 NOTE — Telephone Encounter (Signed)
Please advise 

## 2022-05-14 ENCOUNTER — Ambulatory Visit: Payer: 59 | Admitting: Adult Health

## 2022-05-27 ENCOUNTER — Encounter: Payer: Self-pay | Admitting: Adult Health

## 2022-05-27 ENCOUNTER — Ambulatory Visit: Payer: 59 | Admitting: Adult Health

## 2022-05-27 VITALS — BP 120/80 | HR 71 | Temp 97.7°F | Ht 69.0 in | Wt 323.0 lb

## 2022-05-27 DIAGNOSIS — E668 Other obesity: Secondary | ICD-10-CM | POA: Diagnosis not present

## 2022-05-27 MED ORDER — PHENTERMINE HCL 37.5 MG PO CAPS: 37.5000 mg | ORAL_CAPSULE | ORAL | 2 refills | Status: DC

## 2022-05-27 MED ORDER — TOPIRAMATE 100 MG PO TABS: 100.0000 mg | ORAL_TABLET | Freq: Every day | ORAL | 0 refills | Status: DC

## 2022-05-27 NOTE — Progress Notes (Signed)
Subjective:    Patient ID: Sydney Bradley, female    DOB: 1980/09/12, 42 y.o.   MRN: [id number]  HPI  Presents to the office today for follow-up regarding weight loss management.  Currently maintained on phentermine 30 mg and topamax 50 mg.  In the past her insurance did not provide coverage for any of the newer weight loss medication such as [id number]. During her last visit a month ago her phentermine dose and topamax dose were increased to 30 mg and 50 mg respectfully.   Today she reports that she started to use Noom and is doing her best to make changes in eating healthier.  She has found Noom helpful.   She has not been able to go the gym like she was hoping due to work obligations. She is walking around the office and taking the stairs.   Wt Readings from Last 3 Encounters:  05/27/22 (!) 323 lb (146.5 kg)  04/13/22 (!) 322 lb (146.1 kg)  12/16/21 (!) 315 lb (142.9 kg)    Review of Systems See HPI   Past Medical History:  Diagnosis Date   Arthritis    L5 Back - no meds - otc prn   Asthma    Crohn's disease of ileum (HCC)    Hypertension    no meds x 3 yrs   IBS (irritable bowel syndrome)    Morbid obesity with BMI of 45.0-49.9, adult ([facility])    Sacral fracture ([facility])     Social History   Socioeconomic History   Marital status: Married    Spouse name: Not on file   Number of children: 1   Years of education: 18   Highest education level: Master's degree (e.g., MA, MS, MEng, MEd, MSW, MBA)  Occupational History   Occupation: Education officer, museum    Employer: 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[place], foster care   Married    One biologic son Cornelia Copa) born 2016 and a stepson   One caffeinated beverage daily   Social Determinants of Health   Financial Resource Strain: Low Risk  (10/07/2021)   Overall Financial Resource Strain (CARDIA)    Difficulty of Paying Living Expenses: Not very hard  Food Insecurity: No Food Insecurity (10/07/2021)   Hunger Vital Sign    Worried About Running Out of Food in the Last Year: Never true    [place] in the Last Year: Never true  Transportation Needs: No Transportation Needs (10/07/2021)   PRAPARE - Hydrologist (Medical): No    Lack of Transportation (Non-Medical): No  Physical Activity: Unknown (10/07/2021)   Exercise Vital Sign    Days of Exercise per Week: 0 days    Minutes of Exercise per Session: Not on file  Stress: Stress Concern Present (10/07/2021)   [place]    Feeling of Stress : To some extent  Social Connections: Moderately Integrated (10/07/2021)   Social Connection and Isolation Panel [NHANES]  Frequency of Communication with Friends and Family: More than three times a week    Frequency of Social Gatherings with Friends and Family: Once a week    Attends Religious Services: More than 4 times per year    Active Member of [employer] or Organizations: No    Attends Music therapist: Not on file    Marital Status: Married  Human resources officer Violence: Not on file    Past Surgical History:  Procedure Laterality Date   COLONOSCOPY     DILATION AND EVACUATION  02/25/2012   Procedure: [place];  Surgeon: Daria Pastures, MD;  Location: [facility];  Service: Gynecology;  Laterality: N/A;   DILATION AND EVACUATION N/A 03/01/2013   Procedure: DILATATION AND EVACUATION;  Surgeon: Olga Millers, MD;  Location: [facility];  Service: Gynecology;  Laterality: N/A;   TONSILLECTOMY AND ADENOIDECTOMY  2007     Family History  Problem Relation Age of Onset   Colon cancer Father 54       mets to liver and bone died at 28   COPD Maternal Grandmother    Dementia Maternal Grandmother    Alzheimer's disease Maternal Grandfather    Cancer Paternal Grandmother        blood cancer?   Stroke Paternal Grandmother     Allergies  Allergen Reactions   Latex Hives    Breathing problems   Other Anaphylaxis and Itching    Nuts Tree nuts   Peanut-Containing Drug Products Anaphylaxis    Allergy to all nuts    Shellfish Allergy Anaphylaxis   Ciprofibrate Hives   Penicillins Hives    Has patient had a PCN reaction causing immediate rash, facial/tongue/throat swelling, SOB or lightheadedness with hypotension: Yes Has patient had a PCN reaction causing severe rash involving mucus membranes or skin necrosis: No Has patient had a PCN reaction that required hospitalization No Has patient had a PCN reaction occurring within the last 10 years: No If all of the above answers are "NO", then may proceed with Cephalosporin use.    Chocolate Itching   Ciprofloxacin Hives   Lamisil [Terbinafine] Hives   Wheat Bran Itching    Current Outpatient Medications on File Prior to Visit  Medication Sig Dispense Refill   albuterol (PROVENTIL) (2.5 MG/3ML) 0.083% nebulizer solution Take 3 mLs (2.5 mg total) by nebulization every 4 (four) hours as needed for wheezing or shortness of breath. 75 mL 0   albuterol (VENTOLIN HFA) 108 (90 Base) MCG/ACT inhaler Inhale 2 puffs into the lungs every 4 (four) hours as needed for wheezing. 1 Inhaler 3   ALPRAZolam (XANAX) 0.25 MG tablet TAKE ONE TABLET BY MOUTH TWICE DAILY AS NEEDED FOR ANXIETY 60 tablet 1   buPROPion (WELLBUTRIN XL) 150 MG 24 hr tablet TAKE ONE TABLET BY MOUTH ONE TIME DAILY 90 tablet 0   cetirizine (ZYRTEC) 10 MG tablet Take 10 mg by mouth daily.      Clindamycin-Benzoyl Per, Refr, gel Apply topically.     cyclobenzaprine (FLEXERIL) 10 MG tablet Take 1 tablet  (10 mg total) by mouth 3 (three) times daily as needed for muscle spasms. 30 tablet 0   diphenhydrAMINE (BENADRYL) 25 MG tablet Take 25 mg by mouth as needed for allergies.      fluticasone (FLONASE) 50 MCG/ACT nasal spray Place 2 sprays into both nostrils daily. 16 g 6   hydroquinone 4 % cream Apply 1 Dose topically daily.     montelukast (SINGULAIR) 10 MG tablet Take  1 tablet (10 mg total) by mouth at bedtime. 90 tablet 0   omeprazole (PRILOSEC) 40 MG capsule TAKE ONE CAPSULE BY MOUTH DAILY BEFORE BREAKFAST 90 capsule 1   phentermine 30 MG capsule Take 1 capsule (30 mg total) by mouth every morning. 30 capsule 2   topiramate (TOPAMAX) 50 MG tablet Take 1 tablet (50 mg total) by mouth 2 (two) times daily. 90 tablet 0   Vitamin D, Ergocalciferol, (DRISDOL) 1.25 MG (50000 UNIT) CAPS capsule Take 1 capsule (50,000 Units total) by mouth every 7 (seven) days. 12 capsule 0   No current facility-administered medications on file prior to visit.    BP 120/80   Pulse 71   Temp 97.7 F (36.5 C) (Oral)   Ht 5' 9"$  (1.753 m)   Wt (!) 323 lb (146.5 kg)   SpO2 98%   BMI 47.70 kg/m       Objective:   Physical Exam Vitals and nursing note reviewed.  Constitutional:      Appearance: Normal appearance. She is obese.  Cardiovascular:     Rate and Rhythm: Normal rate and regular rhythm.     Pulses: Normal pulses.     Heart sounds: Normal heart sounds.  Pulmonary:     Effort: Pulmonary effort is normal.     Breath sounds: Normal breath sounds.  Neurological:     General: No focal deficit present.     Mental Status: She is alert and oriented to person, place, and time.  Psychiatric:        Mood and Affect: Mood normal.        Behavior: Behavior normal.        Thought Content: Thought content normal.        Judgment: Judgment normal.       Assessment & Plan:  1. Other obesity - No weight loss over the last month  - Will increase topamax and Phentermine  - Work on increasing exercise  throughout the week  - phentermine 37.5 MG capsule; Take 1 capsule (37.5 mg total) by mouth every morning.  Dispense: 30 capsule; Refill: 2 - topiramate (TOPAMAX) 100 MG tablet; Take 1 tablet (100 mg total) by mouth daily.  Dispense: 90 tablet; Refill: 0  [name], NP

## 2022-06-05 ENCOUNTER — Other Ambulatory Visit: Payer: Self-pay | Admitting: Adult Health

## 2022-06-05 DIAGNOSIS — F419 Anxiety disorder, unspecified: Secondary | ICD-10-CM

## 2022-06-05 DIAGNOSIS — J302 Other seasonal allergic rhinitis: Secondary | ICD-10-CM

## 2022-06-08 MED ORDER — MONTELUKAST SODIUM 10 MG PO TABS: 10.0000 mg | ORAL_TABLET | Freq: Every day | ORAL | 0 refills | Status: DC

## 2022-06-08 MED ORDER — BUPROPION HCL ER (XL) 150 MG PO TB24: 150.0000 mg | ORAL_TABLET | Freq: Every day | ORAL | 0 refills | Status: DC

## 2022-06-10 ENCOUNTER — Encounter: Payer: Self-pay | Admitting: Adult Health

## 2022-06-10 NOTE — Telephone Encounter (Signed)
Please advise 

## 2022-06-13 ENCOUNTER — Ambulatory Visit
Admission: RE | Admit: 2022-06-13 | Discharge: 2022-06-13 | Disposition: A | Discharge status: Home / Self Care | Payer: 59 | Source: Ambulatory Visit | Attending: Physician Assistant | Admitting: Physician Assistant

## 2022-06-13 ENCOUNTER — Other Ambulatory Visit: Payer: Self-pay

## 2022-06-13 VITALS — BP 119/72 | HR 80 | Temp 98.2°F | Resp 18

## 2022-06-13 DIAGNOSIS — J0100 Acute maxillary sinusitis, unspecified: Secondary | ICD-10-CM | POA: Diagnosis not present

## 2022-06-13 DIAGNOSIS — J069 Acute upper respiratory infection, unspecified: Secondary | ICD-10-CM | POA: Diagnosis not present

## 2022-06-13 LAB — POCT RAPID STREP A (OFFICE): Rapid Strep A Screen: NEGATIVE

## 2022-06-13 MED ORDER — PREDNISONE 10 MG PO TABS: 10.0000 mg | ORAL_TABLET | Freq: Three times a day (TID) | ORAL | 0 refills | Status: DC

## 2022-06-13 MED ORDER — AZITHROMYCIN 250 MG PO TABS: ORAL_TABLET | ORAL | 0 refills | Status: DC

## 2022-06-13 NOTE — Discharge Instructions (Signed)
Advised take the Zithromax 250 mg, 2 tablets daily 1 tablet daily until completed. Advised take prednisone 10 mg every 8 hours until completed to reduce sinus congestion. Advised to use Flonase nasal spray, 2 sprays each nostril once daily to help decrease sinus congestion. Advise use Motrin or ibuprofen as needed for body aches and discomfort.  Advised follow-up PCP or return to urgent care as needed.

## 2022-06-13 NOTE — ED Provider Notes (Signed)
[facility]    CSN: [id number] Arrival date & time: 06/13/22  1302      History   Chief Complaint Chief Complaint  Patient presents with   Sore Throat    My son was diagnosed with strep throat 3/1. I think I may have gotten it. - Entered by patient    HPI Sydney Bradley is a 43 y.o. female.   42 year old female presents with sore throat, sinus congestion and pressure.  Patient indicates for the past several days she has been having increasing frontal and maxillary sinus pain, pressure, and tenderness.  She indicates she is having postnasal drip and rhinitis with yellow production.  She also indicates having intermittent sore throat and painful swallowing.  She relates that her son tested positive for strep several days ago.  Patient indicates she is using OTC medications to try and control the congestion but it has not given her relief.  She is without fever or chills.  She is tolerating fluids well.   Sore Throat    Past Medical History:  Diagnosis Date   Arthritis    L5 Back - no meds - otc prn   Asthma    Crohn's disease of ileum ([place])    Hypertension    no meds x 3 yrs   IBS (irritable bowel syndrome)    Morbid obesity with BMI of 45.0-49.9, adult ([place])    Sacral fracture [facility])     Patient Active Problem List   Diagnosis Date Noted   Plantar fasciitis 07/28/2018   Asthma 07/25/2018   BMI 45.0-49.9, adult ([place]) 12/13/2017   Long-term use of immunosuppressant medication - Cimzia 07/01/2017   Crohn's ileitis, with diarrhea ([place]) 12/10/2016   Gastroesophageal reflux disease 12/10/2016   Chronic hypertension complicating or reason for care during pregnancy    Pre-existing hypertension affecting pregnancy in second trimester, antepartum    Other obesity 06/26/2013   Mild persistent asthma 11/14/2012    Past Surgical History:  Procedure Laterality Date   COLONOSCOPY     DILATION AND EVACUATION  02/25/2012   Procedure: DILATATION AND EVACUATION;  Surgeon:  Daria Pastures, MD;  Location: [facility];  Service: Gynecology;  Laterality: N/A;   DILATION AND EVACUATION N/A 03/01/2013   Procedure: DILATATION AND EVACUATION;  Surgeon: Olga Millers, MD;  Location: [facility];  Service: Gynecology;  Laterality: N/A;   TONSILLECTOMY AND ADENOIDECTOMY  2007    OB History     Gravida  3   Para  1   Term      Preterm  1   AB  2   Living  1      SAB  2   IAB      Ectopic      Multiple  0   Live Births  1            Home Medications    Prior to Admission medications   Medication Sig Start Date End Date Taking? Authorizing Provider  azithromycin (ZITHROMAX Z-PAK) 250 MG tablet 2 tablets initially and then 1 tablet daily until completed. 06/13/22  Yes Nyoka Lint, PA-C  predniSONE (DELTASONE) 10 MG tablet Take 1 tablet (10 mg total) by mouth in the morning, at noon, and at bedtime. 06/13/22  Yes Nyoka Lint, PA-C  albuterol (PROVENTIL) (2.5 MG/3ML) 0.083% nebulizer solution Take 3 mLs (2.5 mg total) by nebulization every 4 (four) hours as needed for wheezing or shortness of breath. 03/10/21   Burchette, Alinda Sierras, MD  albuterol (VENTOLIN HFA) 108 (90 Base) MCG/ACT inhaler Inhale 2 puffs into the lungs every 4 (four) hours as needed for wheezing. 08/03/18   Nafziger, Tommi Rumps, NP  ALPRAZolam Duanne Moron) 0.25 MG tablet TAKE ONE TABLET BY MOUTH TWICE DAILY AS NEEDED FOR ANXIETY 03/23/22   Nafziger, Tommi Rumps, NP  buPROPion (WELLBUTRIN XL) 150 MG 24 hr tablet Take 1 tablet (150 mg total) by mouth daily. 06/08/22   Nafziger, Tommi Rumps, NP  cetirizine (ZYRTEC) 10 MG tablet Take 10 mg by mouth daily.     [provider]  Clindamycin-Benzoyl Per, Refr, gel Apply topically. 04/10/21   [provider]  cyclobenzaprine (FLEXERIL) 10 MG tablet Take 1 tablet (10 mg total) by mouth 3 (three) times daily as needed for muscle spasms. 01/19/22   Nafziger, Tommi Rumps, NP  diphenhydrAMINE (BENADRYL) 25 MG tablet Take 25 mg by mouth as needed for allergies.      [provider]  fluticasone (FLONASE) 50 MCG/ACT nasal spray Place 2 sprays into both nostrils daily. 01/11/18   Nafziger, Tommi Rumps, NP  hydroquinone 4 % cream Apply 1 Dose topically daily.    [provider]  montelukast (SINGULAIR) 10 MG tablet Take 1 tablet (10 mg total) by mouth at bedtime. 06/08/22   Nafziger, Tommi Rumps, NP  omeprazole (PRILOSEC) 40 MG capsule TAKE ONE CAPSULE BY MOUTH DAILY BEFORE BREAKFAST 12/30/21   Gatha Mayer, MD  phentermine 37.5 MG capsule Take 1 capsule (37.5 mg total) by mouth every morning. 05/27/22   Nafziger, Tommi Rumps, NP  topiramate (TOPAMAX) 100 MG tablet Take 1 tablet (100 mg total) by mouth daily. 05/27/22   Nafziger, Tommi Rumps, NP  Vitamin D, Ergocalciferol, (DRISDOL) 1.25 MG (50000 UNIT) CAPS capsule Take 1 capsule (50,000 Units total) by mouth every 7 (seven) days. 12/24/21   Nafziger, Tommi Rumps, NP    Family History Family History  Problem Relation Age of Onset   Colon cancer Father 58       mets to liver and bone died at 59   COPD Maternal Grandmother    Dementia Maternal Grandmother    Alzheimer's disease Maternal Grandfather    Cancer Paternal Grandmother        blood cancer?   Stroke Paternal Grandmother     Social History Social History   Tobacco Use   Smoking status: Never   Smokeless tobacco: Never  Vaping Use   Vaping Use: Never used  Substance Use Topics   Alcohol use: No    Alcohol/week: 0.0 standard drinks of alcohol   Drug use: No     Allergies   Latex, Other, Peanut-containing drug products, Shellfish allergy, Ciprofibrate, Penicillins, Chocolate, Ciprofloxacin, Lamisil [terbinafine], and Wheat bran   Review of Systems Review of Systems  HENT:  Positive for postnasal drip, rhinorrhea, sinus pressure, sinus pain and sore throat.      Physical Exam Triage Vital Signs ED Triage Vitals [06/13/22 1334]  Enc Vitals Group     BP 119/72     Pulse Rate 80     Resp 18     Temp 98.2 F (36.8 C)     Temp Source Oral      SpO2 100 %     Weight      Height      Head Circumference      Peak Flow      Pain Score 3     Pain Loc      Pain Edu?      Excl. in [place]?    No  data found.  Updated Vital Signs BP 119/72 (BP Location: Left Arm)   Pulse 80   Temp 98.2 F (36.8 C) (Oral)   Resp 18   SpO2 100%   Visual Acuity Right Eye Distance:   Left Eye Distance:   Bilateral Distance:    Right Eye Near:   Left Eye Near:    Bilateral Near:     Physical Exam Constitutional:      Appearance: She is well-developed.  HENT:     Right Ear: Ear canal normal. Tympanic membrane is injected.     Left Ear: Ear canal normal. Tympanic membrane is injected.     Mouth/Throat:     Mouth: Mucous membranes are moist.     Pharynx: Posterior oropharyngeal erythema present. No oropharyngeal exudate.  Cardiovascular:     Rate and Rhythm: Normal rate and regular rhythm.     Heart sounds: Normal heart sounds.  Pulmonary:     Effort: Pulmonary effort is normal.     Breath sounds: Normal breath sounds and air entry. No wheezing, rhonchi or rales.  Lymphadenopathy:     Cervical: No cervical adenopathy.  Neurological:     Mental Status: She is alert.      UC Treatments / Results  Labs (all labs ordered are listed, but only abnormal results are displayed) Labs Reviewed  POCT RAPID STREP A (OFFICE)    EKG   Radiology No results found.  Procedures Procedures (including critical care time)  Medications Ordered in UC Medications - No data to display  Initial Impression / Assessment and Plan / UC Course  I have reviewed the triage vital signs and the nursing notes.  Pertinent labs & imaging results that were available during my care of the patient were reviewed by me and considered in my medical decision making (see chart for details).    Plan: The diagnosis will be treated with the following: 1.  Upper respiratory infection: A.  Prednisone 10 mg every 8 hours to treat inflammatory response. 2.  Maxillary  sinusitis: A.  Zithromax 250 mg, 2 tablets initially and then 1 tablet daily to treat infection. 3.  Advised to continue Flonase nasal spray, 2 sprays each nostril once daily to help decrease nasal congestion. 4.  Advised follow-up PCP or return to urgent care as needed. Final Clinical Impressions(s) / UC Diagnoses   Final diagnoses:  Acute upper respiratory infection  Acute non-recurrent maxillary sinusitis     Discharge Instructions      Advised take the Zithromax 250 mg, 2 tablets daily 1 tablet daily until completed. Advised take prednisone 10 mg every 8 hours until completed to reduce sinus congestion. Advised to use Flonase nasal spray, 2 sprays each nostril once daily to help decrease sinus congestion. Advise use Motrin or ibuprofen as needed for body aches and discomfort.  Advised follow-up PCP or return to urgent care as needed.    ED Prescriptions     Medication Sig Dispense Auth. Provider   azithromycin (ZITHROMAX Z-PAK) 250 MG tablet 2 tablets initially and then 1 tablet daily until completed. 6 each Nyoka Lint, PA-C   predniSONE (DELTASONE) 10 MG tablet Take 1 tablet (10 mg total) by mouth in the morning, at noon, and at bedtime. 15 tablet Nyoka Lint, PA-C      PDMP not reviewed this encounter.   Nyoka Lint, PA-C 06/13/22 [phone]

## 2022-06-13 NOTE — ED Triage Notes (Signed)
Pt sts sore throat with cough and nasal congestion x 1 week; pt sts son was diagnosed with strep on Friday

## 2022-06-30 ENCOUNTER — Ambulatory Visit (INDEPENDENT_AMBULATORY_CARE_PROVIDER_SITE_OTHER): Payer: 59 | Admitting: Adult Health

## 2022-06-30 ENCOUNTER — Encounter: Payer: Self-pay | Admitting: Adult Health

## 2022-06-30 VITALS — BP 132/88 | Temp 97.5°F | Ht 69.0 in | Wt 321.0 lb

## 2022-06-30 DIAGNOSIS — L509 Urticaria, unspecified: Secondary | ICD-10-CM

## 2022-06-30 MED ORDER — METHYLPREDNISOLONE ACETATE 80 MG/ML IJ SUSP
80.0000 mg | Freq: Once | INTRAMUSCULAR | Status: AC
  Administered 2022-06-30: 80 mg via INTRAMUSCULAR

## 2022-06-30 MED ORDER — METHYLPREDNISOLONE ACETATE 40 MG/ML IJ SUSP
40.0000 mg | Freq: Once | INTRAMUSCULAR | Status: AC
  Administered 2022-06-30: 40 mg via INTRAMUSCULAR

## 2022-06-30 NOTE — Progress Notes (Unsigned)
Subjective:    Patient ID: Sydney Bradley, female    DOB: 1981/04/10, 42 y.o.   MRN: [id number]  HPI 42 year old female who  has a past medical history of Arthritis, Asthma, Crohn's disease of ileum ([place]), Hypertension, IBS (irritable bowel syndrome), Morbid obesity with BMI of 45.0-49.9, adult ([place]), and Sacral fracture ([place]).  She presents to the office today for an acute issue of hives.  She denies any changes in soaps detergents lotions etc.  Hives have been intermittent over the last few weeks, some days she does not have any hives other days she wakes up in the [place] itching with hives covering her body.  She believes there may be some correlation to work stressors causing her to break out she notices hives more often when she is dreaming about work.  Review of Systems See HPI   Past Medical History:  Diagnosis Date   Arthritis    L5 Back - no meds - otc prn   Asthma    Crohn's disease of ileum ([facility])    Hypertension    no meds x 3 yrs   IBS (irritable bowel syndrome)    Morbid obesity with BMI of 45.0-49.9, adult ([facility])    Sacral fracture ([facility])     Social History   Socioeconomic History   Marital status: Married    Spouse name: Not on file   Number of children: 1   Years of education: 18   Highest education level: Master's degree (e.g., MA, MS, MEng, MEd, MSW, MBA)  Occupational History   Occupation: Education officer, museum    Employer: 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[place], foster care   Married    One biologic son Cornelia Copa) born 2016 and a stepson   One caffeinated beverage daily    Social Determinants of Health   Financial Resource Strain: Low Risk  (10/07/2021)   Overall Financial Resource Strain (CARDIA)    Difficulty of Paying Living Expenses: Not very hard  Food Insecurity: No Food Insecurity (10/07/2021)   Hunger Vital Sign    Worried About Running Out of Food in the Last Year: Never true    [place] in the Last Year: Never true  Transportation Needs: No Transportation Needs (10/07/2021)   PRAPARE - Hydrologist (Medical): No    Lack of Transportation (Non-Medical): No  Physical Activity: Unknown (10/07/2021)   Exercise Vital Sign    Days of Exercise per Week: 0 days    Minutes of Exercise per Session: Not on file  Stress: Stress Concern Present (10/07/2021)   [place]    Feeling of Stress : To some extent  Social Connections: Moderately Integrated (10/07/2021)   Social Connection and Isolation Panel [NHANES]    Frequency of Communication with Friends and Family: More than three times a week    Frequency of Social Gatherings with Friends and Family: Once a week    Attends Religious Services: More than 4  times per year    Active Member of Clubs or Organizations: No    Attends Archivist Meetings: Not on file    Marital Status: Married  Human resources officer Violence: Not on file    Past Surgical History:  Procedure Laterality Date   COLONOSCOPY     DILATION AND EVACUATION  02/25/2012   Procedure: DILATATION AND EVACUATION;  Surgeon: Daria Pastures, MD;  Location: [facility];  Service: Gynecology;  Laterality: N/A;   DILATION AND EVACUATION N/A 03/01/2013   Procedure: DILATATION AND EVACUATION;  Surgeon: Olga Millers, MD;  Location: [facility];  Service: Gynecology;  Laterality: N/A;   TONSILLECTOMY AND ADENOIDECTOMY  2007    Family History  Problem Relation Age of Onset   Colon cancer Father 38       mets to liver and bone died at 38   COPD  Maternal Grandmother    Dementia Maternal Grandmother    Alzheimer's disease Maternal Grandfather    Cancer Paternal Grandmother        blood cancer?   Stroke Paternal Grandmother     Allergies  Allergen Reactions   Latex Hives    Breathing problems   Other Anaphylaxis and Itching    Nuts Tree nuts   Peanut-Containing Drug Products Anaphylaxis    Allergy to all nuts    Shellfish Allergy Anaphylaxis   Ciprofibrate Hives   Penicillins Hives    Has patient had a PCN reaction causing immediate rash, facial/tongue/throat swelling, SOB or lightheadedness with hypotension: Yes Has patient had a PCN reaction causing severe rash involving mucus membranes or skin necrosis: No Has patient had a PCN reaction that required hospitalization No Has patient had a PCN reaction occurring within the last 10 years: No If all of the above answers are "NO", then may proceed with Cephalosporin use.    Chocolate Itching   Ciprofloxacin Hives   Lamisil [Terbinafine] Hives   Wheat Itching    Current Outpatient Medications on File Prior to Visit  Medication Sig Dispense Refill   albuterol (PROVENTIL) (2.5 MG/3ML) 0.083% nebulizer solution Take 3 mLs (2.5 mg total) by nebulization every 4 (four) hours as needed for wheezing or shortness of breath. 75 mL 0   albuterol (VENTOLIN HFA) 108 (90 Base) MCG/ACT inhaler Inhale 2 puffs into the lungs every 4 (four) hours as needed for wheezing. 1 Inhaler 3   ALPRAZolam (XANAX) 0.25 MG tablet TAKE ONE TABLET BY MOUTH TWICE DAILY AS NEEDED FOR ANXIETY 60 tablet 1   azithromycin (ZITHROMAX Z-PAK) 250 MG tablet 2 tablets initially and then 1 tablet daily until completed. 6 each 0   buPROPion (WELLBUTRIN XL) 150 MG 24 hr tablet Take 1 tablet (150 mg total) by mouth daily. 90 tablet 0   cetirizine (ZYRTEC) 10 MG tablet Take 10 mg by mouth daily.      Clindamycin-Benzoyl Per, Refr, gel Apply topically.     cyclobenzaprine (FLEXERIL) 10 MG tablet Take 1 tablet (10 mg  total) by mouth 3 (three) times daily as needed for muscle spasms. 30 tablet 0   diphenhydrAMINE (BENADRYL) 25 MG tablet Take 25 mg by mouth as needed for allergies.      fluticasone (FLONASE) 50 MCG/ACT nasal spray Place 2 sprays into both nostrils daily. 16 g 6   hydroquinone 4 % cream Apply 1 Dose topically daily.     montelukast (SINGULAIR) 10 MG tablet Take 1 tablet (10 mg total) by mouth at bedtime. 90 tablet 0   omeprazole (  PRILOSEC) 40 MG capsule TAKE ONE CAPSULE BY MOUTH DAILY BEFORE BREAKFAST 90 capsule 1   phentermine 37.5 MG capsule Take 1 capsule (37.5 mg total) by mouth every morning. 30 capsule 2   topiramate (TOPAMAX) 100 MG tablet Take 1 tablet (100 mg total) by mouth daily. 90 tablet 0   Vitamin D, Ergocalciferol, (DRISDOL) 1.25 MG (50000 UNIT) CAPS capsule Take 1 capsule (50,000 Units total) by mouth every 7 (seven) days. 12 capsule 0   No current facility-administered medications on file prior to visit.    BP 132/88   Temp (!) 97.5 F (36.4 C) (Oral)   Ht 5\' 9"  (1.753 m)   Wt (!) 321 lb (145.6 kg)   BMI 47.40 kg/m       Objective:   Physical Exam Vitals and nursing note reviewed.  Constitutional:      Appearance: Normal appearance.  Cardiovascular:     Rate and Rhythm: Normal rate.     Pulses: Normal pulses.     Heart sounds: Normal heart sounds.  Skin:    General: Skin is warm and dry.     Findings: Rash (flat red rash on bilateral arms) present.  Neurological:     General: No focal deficit present.     Mental Status: She is alert and oriented to person, place, and time.  Psychiatric:        Mood and Affect: Mood normal.        Behavior: Behavior normal.        Thought Content: Thought content normal.        Judgment: Judgment normal.       Assessment & Plan:

## 2022-06-30 NOTE — Patient Instructions (Signed)
Health Maintenance Due  Topic Date Due   Hepatitis C Screening  Never done   INFLUENZA VACCINE  Never done   COVID-19 Vaccine (3 - 2023-24 season) 12/11/2021   PAP SMEAR-Modifier  08/16/2022      Row Labels 10/07/2021    3:46 PM 02/15/2020    1:48 PM 08/04/2015   10:10 AM  Depression screen PHQ 2/9   Section Header. No data exists in this row.     Decreased Interest   0 0 0  Down, Depressed, Hopeless   0 0 0  PHQ - 2 Score   0 0 0

## 2022-07-26 ENCOUNTER — Encounter: Payer: Self-pay | Admitting: Adult Health

## 2022-07-27 NOTE — Telephone Encounter (Signed)
Please advise 

## 2022-08-04 ENCOUNTER — Encounter: Payer: Self-pay | Admitting: Adult Health

## 2022-08-04 ENCOUNTER — Telehealth (INDEPENDENT_AMBULATORY_CARE_PROVIDER_SITE_OTHER): Payer: 59 | Admitting: Adult Health

## 2022-08-04 VITALS — Ht 69.0 in | Wt 321.0 lb

## 2022-08-04 DIAGNOSIS — J302 Other seasonal allergic rhinitis: Secondary | ICD-10-CM | POA: Diagnosis not present

## 2022-08-04 DIAGNOSIS — T782XXA Anaphylactic shock, unspecified, initial encounter: Secondary | ICD-10-CM

## 2022-08-04 MED ORDER — PREDNISONE 20 MG PO TABS: 20.0000 mg | ORAL_TABLET | Freq: Every day | ORAL | 0 refills | Status: DC | PRN

## 2022-08-04 MED ORDER — EPINEPHRINE 0.3 MG/0.3ML IJ SOAJ: 0.3000 mg | INTRAMUSCULAR | 1 refills | Status: DC | PRN

## 2022-08-04 NOTE — Progress Notes (Signed)
Virtual Visit via Video Note  I connected with Sydney Bradley  on 08/04/22 at 10:45 AM EDT by a video enabled telemedicine application and verified that I am speaking with the correct person using two identifiers.  Location patient: home Location provider:work or home office Persons participating in the virtual visit: patient, provider  I discussed the limitations of evaluation and management by telemedicine and the availability of in person appointments. The patient expressed understanding and agreed to proceed.   HPI: 3 days ago she was feeling in her normal state of health, she and her family went to have [place] food.  After she got home she started to develop hives and a burning itching sensation on her back, soon later it started on her legs and then proceeded to go up her arms to her neck and then she eventually started to feel as though her tongue was swelling and that her tongue was also burning.  She took her typical allergy medication of Benadryl, Singulair, and Zyrtec without relief.  Once her symptoms reached the point of feeling as though her mouth was swelling she used her son's EpiPen and her symptoms gradually improved.  She still feels as though the inside of her mouth and jaw all are sore but otherwise not having any rash.  She does have a shellfish allergy, her husband got shrimp fried rice and she is unsure if there was any cross-contamination.  Prior to this she has been fighting with [place] area due to seasonal allergies.   ROS: See pertinent positives and negatives per HPI.  Past Medical History:  Diagnosis Date   Arthritis    L5 Back - no meds - otc prn   Asthma    Crohn's disease of ileum    Hypertension    no meds x 3 yrs   IBS (irritable bowel syndrome)    Morbid obesity with BMI of 45.0-49.9, adult    Sacral fracture     Past Surgical History:  Procedure Laterality Date   COLONOSCOPY     DILATION AND EVACUATION  02/25/2012   Procedure: DILATATION AND  EVACUATION;  Surgeon: Loney Laurence, MD;  Location: [facility];  Service: Gynecology;  Laterality: N/A;   DILATION AND EVACUATION N/A 03/01/2013   Procedure: DILATATION AND EVACUATION;  Surgeon: Levi Aland, MD;  Location: [facility];  Service: Gynecology;  Laterality: N/A;   TONSILLECTOMY AND ADENOIDECTOMY  2007    Family History  Problem Relation Age of Onset   Colon cancer Father 62       mets to liver and bone died at 2   COPD Maternal Grandmother    Dementia Maternal Grandmother    Alzheimer's disease Maternal Grandfather    Cancer Paternal Grandmother        blood cancer?   Stroke Paternal Grandmother        Current Outpatient Medications:    albuterol (PROVENTIL) (2.5 MG/3ML) 0.083% nebulizer solution, Take 3 mLs (2.5 mg total) by nebulization every 4 (four) hours as needed for wheezing or shortness of breath., Disp: 75 mL, Rfl: 0   albuterol (VENTOLIN HFA) 108 (90 Base) MCG/ACT inhaler, Inhale 2 puffs into the lungs every 4 (four) hours as needed for wheezing., Disp: 1 Inhaler, Rfl: 3   ALPRAZolam (XANAX) 0.25 MG tablet, TAKE ONE TABLET BY MOUTH TWICE DAILY AS NEEDED FOR ANXIETY, Disp: 60 tablet, Rfl: 1   buPROPion (WELLBUTRIN XL) 150 MG 24 hr tablet, Take 1 tablet (150 mg total) by mouth daily., Disp:  90 tablet, Rfl: 0   cetirizine (ZYRTEC) 10 MG tablet, Take 10 mg by mouth daily. , Disp: , Rfl:    Clindamycin-Benzoyl Per, Refr, gel, Apply topically., Disp: , Rfl:    cyclobenzaprine (FLEXERIL) 10 MG tablet, Take 1 tablet (10 mg total) by mouth 3 (three) times daily as needed for muscle spasms., Disp: 30 tablet, Rfl: 0   diphenhydrAMINE (BENADRYL) 25 MG tablet, Take 25 mg by mouth as needed for allergies. , Disp: , Rfl:    fluticasone (FLONASE) 50 MCG/ACT nasal spray, Place 2 sprays into both nostrils daily., Disp: 16 g, Rfl: 6   hydroquinone 4 % cream, Apply 1 Dose topically daily., Disp: , Rfl:    montelukast (SINGULAIR) 10 MG tablet, Take 1 tablet (10 mg total) by mouth  at bedtime., Disp: 90 tablet, Rfl: 0   omeprazole (PRILOSEC) 40 MG capsule, TAKE ONE CAPSULE BY MOUTH DAILY BEFORE BREAKFAST, Disp: 90 capsule, Rfl: 1   phentermine 37.5 MG capsule, Take 1 capsule (37.5 mg total) by mouth every morning., Disp: 30 capsule, Rfl: 2   topiramate (TOPAMAX) 100 MG tablet, Take 1 tablet (100 mg total) by mouth daily., Disp: 90 tablet, Rfl: 0   Vitamin D, Ergocalciferol, (DRISDOL) 1.25 MG (50000 UNIT) CAPS capsule, Take 1 capsule (50,000 Units total) by mouth every 7 (seven) days., Disp: 12 capsule, Rfl: 0  EXAM:  VITALS per patient if applicable:  GENERAL: alert, oriented, appears well and in no acute distress  HEENT: atraumatic, conjunttiva clear, no obvious abnormalities on inspection of external nose and ears  NECK: normal movements of the head and neck  LUNGS: on inspection no signs of respiratory distress, breathing rate appears normal, no obvious gross SOB, gasping or wheezing  CV: no obvious cyanosis  MS: moves all visible extremities without noticeable abnormality  PSYCH/NEURO: pleasant and cooperative, no obvious depression or anxiety, speech and thought processing grossly intact  ASSESSMENT AND PLAN:  Discussed the following assessment and plan:  1. Anaphylaxis, initial encounter -Will send an EpiPen and prednisone that she can keep on hand.  Also will make a referral to allergy clinic. - EPINEPHrine 0.3 mg/0.3 mL IJ SOAJ injection; Inject 0.3 mg into the muscle as needed for anaphylaxis.  Dispense: 2 each; Refill: 1 - predniSONE (DELTASONE) 20 MG tablet; Take 1 tablet (20 mg total) by mouth daily as needed.  Dispense: 20 tablet; Refill: 0 - Ambulatory referral to Allergy  2. Seasonal allergies -Have her switch back to Xyzal. - Ambulatory referral to Allergy      I discussed the assessment and treatment plan with the patient. The patient was provided an opportunity to ask questions and all were answered. The patient agreed with the plan  and demonstrated an understanding of the instructions.   The patient was advised to call back or seek an in-person evaluation if the symptoms worsen or if the condition fails to improve as anticipated.   Shirline Frees, NP

## 2022-08-14 ENCOUNTER — Other Ambulatory Visit: Payer: Self-pay | Admitting: Internal Medicine

## 2022-08-26 ENCOUNTER — Ambulatory Visit: Payer: 59 | Admitting: Adult Health

## 2022-08-26 ENCOUNTER — Encounter: Payer: Self-pay | Admitting: Adult Health

## 2022-08-26 VITALS — BP 130/90 | HR 95 | Temp 98.0°F | Ht 69.0 in | Wt 322.0 lb

## 2022-08-26 DIAGNOSIS — E668 Other obesity: Secondary | ICD-10-CM

## 2022-08-26 DIAGNOSIS — Z6841 Body Mass Index (BMI) 40.0 and over, adult: Secondary | ICD-10-CM

## 2022-08-26 NOTE — Progress Notes (Signed)
Subjective:    Patient ID: Sydney Bradley, female    DOB: Aug 23, 1980, 42 y.o.   MRN: [id number]  HPI 42 year old female who  has a past medical history of Arthritis, Asthma, Crohn's disease of ileum (HCC), Hypertension, IBS (irritable bowel syndrome), Morbid obesity with BMI of 45.0-49.9, adult (HCC), and Sacral fracture (HCC).  Presents to the office today for follow-up regarding weight loss management.  Currently maintained on phentermine 37.5 mg and topamax 100mg .  In the past her insurance did not provide coverage for any of the newer weight loss medication such as NWGNFA.   She is using Noom and is doing her best to make changes in eating healthier.  She has found Noom helpful.   She has not been able to go the gym like she was hoping due to work obligations. She is walking around the office and taking the stairs.   Wt Readings from Last 5 Encounters:  08/26/22 (!) 322 lb (146.1 kg)  08/04/22 (!) 321 lb (145.6 kg)  06/30/22 (!) 321 lb (145.6 kg)  05/27/22 (!) 323 lb (146.5 kg)  04/13/22 (!) 322 lb (146.1 kg)    Review of Systems See HPI   Past Medical History:  Diagnosis Date   Arthritis    L5 Back - no meds - otc prn   Asthma    Crohn's disease of ileum (HCC)    Hypertension    no meds x 3 yrs   IBS (irritable bowel syndrome)    Morbid obesity with BMI of 45.0-49.9, adult (HCC)    Sacral fracture (HCC)     Social History   Socioeconomic History   Marital status: Married    Spouse name: Not on file   Number of children: 1   Years of education: 18   Highest education level: Master's degree (e.g., MA, MS, MEng, MEd, MSW, MBA)  Occupational History   Occupation: Child psychotherapist    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[place], foster care   Married    One biologic son Marlene Bast) born 2016 and a stepson   One caffeinated beverage daily   Social Determinants of Health   Financial Resource Strain: Low Risk  (10/07/2021)   Overall Financial Resource Strain (CARDIA)    Difficulty of Paying Living Expenses: Not very hard  Food Insecurity: No Food Insecurity (10/07/2021)   Hunger Vital Sign    Worried About Running Out of Food in the Last Year: Never true    Ran Out of Food in the Last Year: Never true  Transportation Needs: No Transportation Needs (10/07/2021)   PRAPARE - Administrator, Civil Service (Medical): No    Lack of Transportation (Non-Medical): No  Physical Activity: Unknown (10/07/2021)   Exercise Vital Sign    Days of Exercise per Week: 0 days    Minutes of Exercise per Session: Not on file  Stress: Stress Concern Present (10/07/2021)   [employer] of Occupational Health - Occupational Stress Questionnaire    Feeling of  Stress : To some extent  Social Connections: Moderately Integrated (10/07/2021)   Social Connection and Isolation Panel [NHANES]    Frequency of Communication with Friends and Family: More than three times a week    Frequency of Social Gatherings with Friends and Family: Once a week    Attends Religious Services: More than 4 times per year    Active Member of [employer] or Organizations: No    Attends Engineer, structural: Not on file    Marital Status: Married  Catering manager Violence: Not on file    Past Surgical History:  Procedure Laterality Date   COLONOSCOPY     DILATION AND EVACUATION  02/25/2012   Procedure: DILATATION AND EVACUATION;  Surgeon: Loney Laurence, MD;  Location: [facility];  Service: Gynecology;  Laterality: N/A;   DILATION AND EVACUATION N/A 03/01/2013   Procedure: DILATATION  AND EVACUATION;  Surgeon: Levi Aland, MD;  Location: [facility];  Service: Gynecology;  Laterality: N/A;   TONSILLECTOMY AND ADENOIDECTOMY  2007    Family History  Problem Relation Age of Onset   Colon cancer Father 74       mets to liver and bone died at 36   COPD Maternal Grandmother    Dementia Maternal Grandmother    Alzheimer's disease Maternal Grandfather    Cancer Paternal Grandmother        blood cancer?   Stroke Paternal Grandmother     Allergies  Allergen Reactions   Latex Hives    Breathing problems   Other Anaphylaxis and Itching    Nuts Tree nuts   Peanut-Containing Drug Products Anaphylaxis    Allergy to all nuts    Shellfish Allergy Anaphylaxis   Ciprofibrate Hives   Penicillins Hives    Has patient had a PCN reaction causing immediate rash, facial/tongue/throat swelling, SOB or lightheadedness with hypotension: Yes Has patient had a PCN reaction causing severe rash involving mucus membranes or skin necrosis: No Has patient had a PCN reaction that required hospitalization No Has patient had a PCN reaction occurring within the last 10 years: No If all of the above answers are "NO", then may proceed with Cephalosporin use.    Chocolate Itching   Ciprofloxacin Hives   Lamisil [Terbinafine] Hives   Wheat Itching    Current Outpatient Medications on File Prior to Visit  Medication Sig Dispense Refill   albuterol (PROVENTIL) (2.5 MG/3ML) 0.083% nebulizer solution Take 3 mLs (2.5 mg total) by nebulization every 4 (four) hours as needed for wheezing or shortness of breath. 75 mL 0   albuterol (VENTOLIN HFA) 108 (90 Base) MCG/ACT inhaler Inhale 2 puffs into the lungs every 4 (four) hours as needed for wheezing. 1 Inhaler 3   ALPRAZolam (XANAX) 0.25 MG tablet TAKE ONE TABLET BY MOUTH TWICE DAILY AS NEEDED FOR ANXIETY 60 tablet 1   buPROPion (WELLBUTRIN XL) 150 MG 24 hr tablet Take 1 tablet (150 mg total) by mouth daily. 90 tablet 0   cetirizine (ZYRTEC) 10 MG  tablet Take 10 mg by mouth daily.      Clindamycin-Benzoyl Per, Refr, gel Apply topically.     clotrimazole-betamethasone (LOTRISONE) cream Apply topically.     cyclobenzaprine (FLEXERIL) 10 MG tablet Take 1 tablet (10 mg total) by mouth 3 (three) times daily as needed for muscle spasms. 30 tablet 0   diphenhydrAMINE (BENADRYL) 25 MG tablet Take 25 mg by mouth as needed for allergies.      EPINEPHrine 0.3 mg/0.3 mL IJ SOAJ  injection Inject 0.3 mg into the muscle as needed for anaphylaxis. 2 each 1   fluticasone (FLONASE) 50 MCG/ACT nasal spray Place 2 sprays into both nostrils daily. 16 g 6   hydroquinone 4 % cream Apply 1 Dose topically daily.     montelukast (SINGULAIR) 10 MG tablet Take 1 tablet (10 mg total) by mouth at bedtime. 90 tablet 0   omeprazole (PRILOSEC) 40 MG capsule TAKE ONE CAPSULE BY MOUTH DAILY BEFORE BREAKFAST 90 capsule 0   phentermine 37.5 MG capsule Take 1 capsule (37.5 mg total) by mouth every morning. 30 capsule 2   predniSONE (DELTASONE) 20 MG tablet Take 1 tablet (20 mg total) by mouth daily as needed. 20 tablet 0   topiramate (TOPAMAX) 100 MG tablet Take 1 tablet (100 mg total) by mouth daily. 90 tablet 0   Vitamin D, Ergocalciferol, (DRISDOL) 1.25 MG (50000 UNIT) CAPS capsule Take 1 capsule (50,000 Units total) by mouth every 7 (seven) days. 12 capsule 0   No current facility-administered medications on file prior to visit.    BP (!) 130/90   Pulse 95   Temp 98 F (36.7 C) (Oral)   Ht 5\' 9"  (1.753 m)   Wt (!) 322 lb (146.1 kg)   SpO2 98%   BMI 47.55 kg/m       Objective:   Physical Exam Vitals and nursing note reviewed.  Constitutional:      Appearance: Normal appearance.  Cardiovascular:     Rate and Rhythm: Normal rate and regular rhythm.     Pulses: Normal pulses.     Heart sounds: Normal heart sounds.  Pulmonary:     Effort: Pulmonary effort is normal.     Breath sounds: Normal breath sounds.  Musculoskeletal:        General: Normal range of  motion.  Skin:    General: Skin is warm and dry.  Neurological:     General: No focal deficit present.     Mental Status: She is alert and oriented to person, place, and time.  Psychiatric:        Mood and Affect: Mood normal.        Behavior: Behavior normal.        Thought Content: Thought content normal.        Judgment: Judgment normal.       Assessment & Plan:  1. Other obesity - Will d/c phentermine and topamax as this is not working.  - She is open to discussion with bariatric surgery - Ambulatory referral to General Surgery  2. BMI 45.0-49.9, adult [facility])  - Ambulatory referral to General Surgery   Shirline Frees, NP

## 2022-09-20 ENCOUNTER — Other Ambulatory Visit: Payer: Self-pay | Admitting: Adult Health

## 2022-09-20 DIAGNOSIS — F419 Anxiety disorder, unspecified: Secondary | ICD-10-CM

## 2022-09-23 ENCOUNTER — Encounter: Payer: Self-pay | Admitting: Allergy

## 2022-09-23 ENCOUNTER — Ambulatory Visit: Payer: 59 | Admitting: Allergy

## 2022-09-23 ENCOUNTER — Other Ambulatory Visit: Payer: Self-pay

## 2022-09-23 VITALS — BP 114/88 | HR 84 | Temp 98.3°F | Ht 68.9 in | Wt 326.4 lb

## 2022-09-23 DIAGNOSIS — J4520 Mild intermittent asthma, uncomplicated: Secondary | ICD-10-CM

## 2022-09-23 DIAGNOSIS — T7800XA Anaphylactic reaction due to unspecified food, initial encounter: Secondary | ICD-10-CM | POA: Diagnosis not present

## 2022-09-23 DIAGNOSIS — L501 Idiopathic urticaria: Secondary | ICD-10-CM | POA: Diagnosis not present

## 2022-09-23 DIAGNOSIS — K9049 Malabsorption due to intolerance, not elsewhere classified: Secondary | ICD-10-CM

## 2022-09-23 DIAGNOSIS — J3089 Other allergic rhinitis: Secondary | ICD-10-CM

## 2022-09-23 DIAGNOSIS — H1013 Acute atopic conjunctivitis, bilateral: Secondary | ICD-10-CM

## 2022-09-23 DIAGNOSIS — L299 Pruritus, unspecified: Secondary | ICD-10-CM | POA: Diagnosis not present

## 2022-09-23 MED ORDER — FAMOTIDINE 10 MG PO TABS: 10.0000 mg | ORAL_TABLET | Freq: Two times a day (BID) | ORAL | 3 refills | Status: DC

## 2022-09-23 MED ORDER — MONTELUKAST SODIUM 10 MG PO TABS: 10.0000 mg | ORAL_TABLET | Freq: Every day | ORAL | 3 refills | Status: DC

## 2022-09-23 MED ORDER — OLOPATADINE HCL 0.2 % OP SOLN: OPHTHALMIC | 3 refills | Status: AC

## 2022-09-23 MED ORDER — LEVOCETIRIZINE DIHYDROCHLORIDE 5 MG PO TABS: 5.0000 mg | ORAL_TABLET | Freq: Two times a day (BID) | ORAL | 3 refills | Status: DC

## 2022-09-23 MED ORDER — ALBUTEROL SULFATE HFA 108 (90 BASE) MCG/ACT IN AERS: 2.0000 | INHALATION_SPRAY | RESPIRATORY_TRACT | 1 refills | Status: DC | PRN

## 2022-09-23 NOTE — Patient Instructions (Signed)
Hives and Itching, chronic - at this time etiology of hives and itching is unknown.  Hives can be caused by a variety of different triggers including illness/infection, foods, medications, stings, exercise, pressure, vibrations, extremes of temperature to name a few however majority of the time there is no identifiable trigger.  Your symptoms have been ongoing for >6 weeks making this chronic thus will obtain labwork to evaluate: CBC w diff, CMP, tryptase, hive panel, environmental panel, alpha-gal panel, inflammatory markers  - start Xyzal 5mg  1 tab twice day, Pepcid 10mg  1 tab twice a day and continue Singulair 10mg  daily  You can take pepcid with the omeprazole as they work differently for reflux  - if above regimen is not effective enough to control hives then would consider starting Xolair monthly injections for control.  Informational brochure provided  Allergies - will obtain environmental allergy panel as above - change Zyrtec to Xyzal as above - Singulair as above - Can use Pataday 1 drop each eye daily as needed for itchy/watery eyes  Food allergy and intolerance - continue your current food avoidance - have access to self-injectable epinephrine (Epipen or AuviQ) 0.3mg  at all times - follow emergency action plan in case of allergic reaction  Asthma - have access to albuterol inhaler 2 puffs every 4-6 hours as needed for cough/wheeze/shortness of breath/chest tightness.  May use 15-20 minutes prior to activity.   Monitor frequency of use.    Follow-up in 2-3 months or sooner if needed

## 2022-09-23 NOTE — Progress Notes (Signed)
New Patient Note  RE: Sydney Bradley MRN: [id number] DOB: 04-17-1980 Date of Office Visit: 09/23/2022   Primary care provider: Shirline Frees, NP  Chief Complaint: hives and itching  History of present illness: Sydney Bradley is a 42 y.o. female presenting today for evaluation of seasonal allergies.   She states her allergies have gotten worse in the past 6 months.  She states she itches for no reason.  She has not changed anything in her day to day routine.  She stays away from her food allergens. She states she can start itching at work, at home, really any environment.  She states she can wake up from sleep itching.  The red marks she will see on skin can last several hours. She states she has had some scars left from the rash.  She does report knee and hip pain but she does have arthritis and states her right knee has been hurting more. She states she fractured L5/sacrum in undergrad so she can have associated pain related to that. No fevers in the past 6 month.     She states she always use dove soap.  She has used triamcinolone cream that does help stop the itch when used.  She has had prednisone for this rash and itch issue.  She just finished a prednisone course yesterday.  No preceding illnesses, bites/stings, no new medications, no new foods.  She reports having allergy to shellfish (the harder the shell the worse reaction - throat and tongue swelling, may flare asthma), chocolate (itching and was positive to allergy testing), peanuts and tree nuts (nausea, vomiting/diarrhea).   She does avoid fish as well.  She is lactose intolerant.  She feels she may have a wheat allergy as she can have itching after eating bread products however states that may not be every time.  She does have a epipen device.   She has history of asthma.  She has albuterol inhaler and nebulizer.  She states she does not have to use these much at all.  She wears mask when she is outside to decrease pollen exposure.  She has  seasonal allergies. She can have itchy eyes, itchy ears, sneezing, nasal congestion/drainage. She takes singulair and zyrtec daily.  She is not sure if zyrtec is effective and has been on for years.  Claritin doesn't work.  She has tried Careers adviser and xyzal before.  She has flonase but she hates putting something in the nose but when allergies are bad she will use.  She has used an eye drops in the past. No history of eczema.  She has Crohns disease which is managed with her diet.  She has been to an allergist many years ago.   Review of systems: Review of Systems  Constitutional: Negative.   HENT: Negative.    Eyes: Negative.   Respiratory: Negative.    Cardiovascular: Negative.   Gastrointestinal: Negative.   Musculoskeletal: Negative.   Skin:  Positive for rash.       See HPI  Allergic/Immunologic: Negative.   Neurological: Negative.     All other systems negative unless noted above in HPI  Past medical history: Past Medical History:  Diagnosis Date   Arthritis    L5 Back - no meds - otc prn   Asthma    Crohn's disease of ileum (HCC)    Hypertension    no meds x 3 yrs   IBS (irritable bowel syndrome)    Morbid obesity with BMI of 45.0-49.9, adult (  HCC)    Recurrent upper respiratory infection (URI)    Sacral fracture (HCC)    Urticaria     Past surgical history: Past Surgical History:  Procedure Laterality Date   ADENOIDECTOMY     COLONOSCOPY     DILATION AND EVACUATION  02/25/2012   Procedure: DILATATION AND EVACUATION;  Surgeon: Loney Laurence, MD;  Location: [facility];  Service: Gynecology;  Laterality: N/A;   DILATION AND EVACUATION N/A 03/01/2013   Procedure: DILATATION AND EVACUATION;  Surgeon: Levi Aland, MD;  Location: [facility];  Service: Gynecology;  Laterality: N/A;   TONSILLECTOMY     TONSILLECTOMY AND ADENOIDECTOMY  2007    Family history:  Family History  Problem Relation Age of Onset   Colon cancer Father 30       mets to liver and bone died  at 53   Angioedema Sister    COPD Maternal Grandmother    Dementia Maternal Grandmother    Alzheimer's disease Maternal Grandfather    Cancer Paternal Grandmother        blood cancer?   Stroke Paternal Grandmother    Eczema Son    Asthma Son    Allergic rhinitis Son     Social history: Lives in a home with carpeting with gas heating and central cooling.  Turtle in the home.  No concern for water damage, mildew or roaches in the home.  She is a Systems developer.  Denies smoking history.    Medication List: Current Outpatient Medications  Medication Sig Dispense Refill   albuterol (PROVENTIL) (2.5 MG/3ML) 0.083% nebulizer solution Take 3 mLs (2.5 mg total) by nebulization every 4 (four) hours as needed for wheezing or shortness of breath. 75 mL 0   albuterol (VENTOLIN HFA) 108 (90 Base) MCG/ACT inhaler Inhale 2 puffs into the lungs every 4 (four) hours as needed for wheezing. 1 Inhaler 3   albuterol (VENTOLIN HFA) 108 (90 Base) MCG/ACT inhaler Inhale 2 puffs into the lungs every 4 (four) hours as needed for wheezing or shortness of breath. 18 g 1   buPROPion (WELLBUTRIN XL) 150 MG 24 hr tablet TAKE ONE TABLET BY MOUTH ONE TIME DAILY 90 tablet 0   cetirizine (ZYRTEC) 10 MG tablet Take 10 mg by mouth daily.      Clindamycin-Benzoyl Per, Refr, gel Apply topically.     clotrimazole-betamethasone (LOTRISONE) cream Apply topically.     diphenhydrAMINE (BENADRYL) 25 MG tablet Take 25 mg by mouth as needed for allergies.      EPINEPHrine 0.3 mg/0.3 mL IJ SOAJ injection Inject 0.3 mg into the muscle as needed for anaphylaxis. 2 each 1   famotidine (PEPCID AC) 10 MG tablet Take 1 tablet (10 mg total) by mouth 2 (two) times daily. 60 tablet 3   fluticasone (FLONASE) 50 MCG/ACT nasal spray Place 2 sprays into both nostrils daily. 16 g 6   hydroquinone 4 % cream Apply 1 Dose topically daily.     levocetirizine (XYZAL) 5 MG tablet Take 1 tablet (5 mg total) by mouth in the morning and at  bedtime. 60 tablet 3   montelukast (SINGULAIR) 10 MG tablet Take 1 tablet (10 mg total) by mouth at bedtime. 90 tablet 0   montelukast (SINGULAIR) 10 MG tablet Take 1 tablet (10 mg total) by mouth at bedtime. 30 tablet 3   Olopatadine HCl (PATADAY) 0.2 % SOLN 1 drop each eye daily as needed for itchy/watery eyes. 2.5 mL 3   omeprazole (PRILOSEC) 40 MG capsule  TAKE ONE CAPSULE BY MOUTH DAILY BEFORE BREAKFAST 90 capsule 0   No current facility-administered medications for this visit.    Known medication allergies: Allergies  Allergen Reactions   Latex Hives    Breathing problems   Other Anaphylaxis and Itching    Nuts Tree nuts   Peanut-Containing Drug Products Anaphylaxis    Allergy to all nuts    Shellfish Allergy Anaphylaxis   Ciprofibrate Hives   Penicillins Hives    Has patient had a PCN reaction causing immediate rash, facial/tongue/throat swelling, SOB or lightheadedness with hypotension: Yes Has patient had a PCN reaction causing severe rash involving mucus membranes or skin necrosis: No Has patient had a PCN reaction that required hospitalization No Has patient had a PCN reaction occurring within the last 10 years: No If all of the above answers are "NO", then may proceed with Cephalosporin use.    Chocolate Itching   Ciprofloxacin Hives   Lamisil [Terbinafine] Hives   Wheat Itching     Physical examination: Blood pressure 114/88, pulse 84, temperature 98.3 F (36.8 C), height 5' 8.9" (1.75 m), weight (!) 326 lb 6.4 oz (148.1 kg), SpO2 98 %.  General: Alert, interactive, in no acute distress. HEENT: PERRLA, TMs pearly gray, turbinates non-edematous without discharge, post-pharynx non erythematous. Neck: Supple without lymphadenopathy. Lungs: Clear to auscultation without wheezing, rhonchi or rales. {no increased work of breathing. CV: Normal S1, S2 without murmurs. Abdomen: Nondistended, nontender. Skin: Scattered erythematous urticarial type lesions primarily  located forearms and antecubital fossa area bilaterally , nonvesicular. Extremities:  No clubbing, cyanosis or edema. Neuro:   Grossly intact.  Diagnositics/Labs:  Allergy testing: Deferred due to ongoing urticaria   Assessment and plan: Urticaria and Itching, chronic - at this time etiology of hives and itching is unknown.  Hives can be caused by a variety of different triggers including illness/infection, foods, medications, stings, exercise, pressure, vibrations, extremes of temperature to name a few however majority of the time there is no identifiable trigger.  Your symptoms have been ongoing for >6 weeks making this chronic thus will obtain labwork to evaluate: CBC w diff, CMP, tryptase, hive panel, environmental panel, alpha-gal panel, inflammatory markers  - start Xyzal 5mg  1 tab twice day, Pepcid 10mg  1 tab twice a day and continue Singulair 10mg  daily  You can take pepcid with the omeprazole as they work differently for reflux  - if above regimen is not effective enough to control hives then would consider starting Xolair monthly injections for control.  Informational brochure provided  Rhinoconjunctivitis - will obtain environmental allergy panel as above - change Zyrtec to Xyzal as above - Singulair as above - Can use Pataday 1 drop each eye daily as needed for itchy/watery eyes  Food allergy and intolerance - continue your current food avoidance - have access to self-injectable epinephrine (Epipen or AuviQ) 0.3mg  at all times - follow emergency action plan in case of allergic reaction  Asthma - have access to albuterol inhaler 2 puffs every 4-6 hours as needed for cough/wheeze/shortness of breath/chest tightness.  May use 15-20 minutes prior to activity.   Monitor frequency of use.    Follow-up in 2-3 months or sooner if needed  I appreciate the opportunity to take part in Kiah's care. Please do not hesitate to contact me with questions.  Sincerely,   Margo Aye, MD Allergy/Immunology [facility]

## 2022-09-26 LAB — ALLERGENS W/TOTAL IGE AREA 2
Alternaria Alternata IgE: 0.45 kU/L — AB
Aspergillus Fumigatus IgE: 0.25 kU/L — AB
Bermuda Grass IgE: 0.38 kU/L — AB
Cat Dander IgE: <0.1 kU/L
Cedar, Mountain IgE: <0.1 kU/L
Cladosporium Herbarum IgE: 0.13 kU/L — AB
Cockroach, German IgE: <0.1 kU/L
Common Silver Birch IgE: 0.8 kU/L — AB
Cottonwood IgE: <0.1 kU/L
D Farinae IgE: <0.1 kU/L
D Pteronyssinus IgE: <0.1 kU/L
Dog Dander IgE: <0.1 kU/L
Elm, American IgE: <0.1 kU/L
IgE (Immunoglobulin E), Serum: 85 IU/mL (ref 6–495)
Johnson Grass IgE: 0.4 kU/L — AB
Maple/Box Elder IgE: <0.1 kU/L
Mouse Urine IgE: <0.1 kU/L
Oak, White IgE: 1.15 kU/L — AB
Pecan, Hickory IgE: 0.13 kU/L — AB
Penicillium Chrysogen IgE: <0.1 kU/L
Pigweed, Rough IgE: <0.1 kU/L
Ragweed, Short IgE: <0.1 kU/L
Sheep Sorrel IgE Qn: <0.1 kU/L
Timothy Grass IgE: 0.61 kU/L — AB
White Mulberry IgE: <0.1 kU/L

## 2022-09-28 LAB — COMPREHENSIVE METABOLIC PANEL WITH GFR
ALT: 15 IU/L (ref 0–32)
AST: 11 IU/L (ref 0–40)
Albumin/Globulin Ratio: 1.3
Albumin: 3.9 g/dL (ref 3.9–4.9)
Alkaline Phosphatase: 84 IU/L (ref 44–121)
BUN/Creatinine Ratio: 9 (ref 9–23)
BUN: 7 mg/dL (ref 6–24)
Bilirubin Total: 0.3 mg/dL (ref 0.0–1.2)
CO2: 23 mmol/L (ref 20–29)
Calcium: 9.2 mg/dL (ref 8.7–10.2)
Chloride: 105 mmol/L (ref 96–106)
Creatinine, Ser: 0.75 mg/dL (ref 0.57–1.00)
Globulin, Total: 3 g/dL (ref 1.5–4.5)
Glucose: 84 mg/dL (ref 70–99)
Potassium: 4.2 mmol/L (ref 3.5–5.2)
Sodium: 142 mmol/L (ref 134–144)
Total Protein: 6.9 g/dL (ref 6.0–8.5)
eGFR: 101 mL/min/1.73

## 2022-09-28 LAB — ALLERGEN PROFILE, FOOD-FISH
Allergen Mackerel IgE: <0.1 kU/L
Allergen Salmon IgE: <0.1 kU/L
Allergen Trout IgE: <0.1 kU/L
Allergen Walley Pike IgE: <0.1 kU/L
Codfish IgE: <0.1 kU/L
Halibut IgE: <0.1 kU/L
Tuna: <0.1 kU/L

## 2022-09-28 LAB — ALPHA-GAL PANEL
Allergen Lamb IgE: <0.1 kU/L
Beef IgE: <0.1 kU/L
IgE (Immunoglobulin E), Serum: 82 IU/mL (ref 6–495)
O215-IgE Alpha-Gal: <0.1 kU/L
Pork IgE: <0.1 kU/L

## 2022-09-28 LAB — ALLERGEN PROFILE, SHELLFISH
Clam IgE: <0.1 kU/L
F023-IgE Crab: <0.1 kU/L
F080-IgE Lobster: <0.1 kU/L
F290-IgE Oyster: <0.1 kU/L
Scallop IgE: <0.1 kU/L
Shrimp IgE: <0.1 kU/L

## 2022-09-28 LAB — PANEL 604726
Cor A 1 IgE: 1.1 kU/L — AB
Cor A 14 IgE: <0.1 kU/L
Cor A 8 IgE: <0.1 kU/L
Cor A 9 IgE: <0.1 kU/L

## 2022-09-28 LAB — CBC WITH DIFFERENTIAL
Basophils Absolute: 0 10*3/uL (ref 0.0–0.2)
Basos: 0 %
EOS (ABSOLUTE): 0.1 10*3/uL (ref 0.0–0.4)
Eos: 1 %
Hematocrit: 40.5 % (ref 34.0–46.6)
Hemoglobin: 12.9 g/dL (ref 11.1–15.9)
Immature Grans (Abs): 0 10*3/uL (ref 0.0–0.1)
Immature Granulocytes: 0 %
Lymphocytes Absolute: 2.2 10*3/uL (ref 0.7–3.1)
Lymphs: 30 %
MCH: 27.1 pg (ref 26.6–33.0)
MCHC: 31.9 g/dL (ref 31.5–35.7)
MCV: 85 fL (ref 79–97)
Monocytes Absolute: 0.6 10*3/uL (ref 0.1–0.9)
Monocytes: 8 %
Neutrophils Absolute: 4.5 10*3/uL (ref 1.4–7.0)
Neutrophils: 61 %
RBC: 4.76 x10E6/uL (ref 3.77–5.28)
RDW: 12.7 % (ref 11.7–15.4)
WBC: 7.3 10*3/uL (ref 3.4–10.8)

## 2022-09-28 LAB — IGE NUT PROF. W/COMPONENT RFLX
F017-IgE Hazelnut (Filbert): 0.76 kU/L — AB
F018-IgE Brazil Nut: <0.1 kU/L
F020-IgE Almond: <0.1 kU/L
F202-IgE Cashew Nut: <0.1 kU/L
F203-IgE Pistachio Nut: <0.1 kU/L
F256-IgE Walnut: <0.1 kU/L
Macadamia Nut, IgE: <0.1 kU/L
Peanut, IgE: <0.1 kU/L
Pecan Nut IgE: <0.1 kU/L

## 2022-09-28 LAB — CHRONIC URTICARIA: cu index: 2.7 (ref <10)

## 2022-09-28 LAB — ANA W/REFLEX: Anti Nuclear Antibody (ANA): NEGATIVE

## 2022-09-28 LAB — ALLERGEN COMPONENT COMMENTS

## 2022-09-28 LAB — THYROID ANTIBODIES
Thyroglobulin Antibody: <1 IU/mL (ref 0.0–0.9)
Thyroperoxidase Ab SerPl-aCnc: <9 IU/mL (ref 0–34)

## 2022-09-28 LAB — TSH: TSH: 2.12 u[IU]/mL (ref 0.450–4.500)

## 2022-09-28 LAB — ALLERGEN CHOCOLATE: Chocolate/Cacao IgE: <0.1 kU/L

## 2022-09-28 LAB — ALLERGEN, WHEAT, F4: Wheat IgE: <0.1 kU/L

## 2022-09-28 LAB — TRYPTASE: Tryptase: 7.1 ug/L (ref 2.2–13.2)

## 2022-09-28 LAB — SEDIMENTATION RATE: Sed Rate: 65 mm/hr — ABNORMAL HIGH (ref 0–32)

## 2022-11-02 ENCOUNTER — Encounter: Payer: Self-pay | Admitting: Adult Health

## 2022-11-03 NOTE — Telephone Encounter (Signed)
Pt stated she discussed this at previous visit. Ok to send in or schedule pt?

## 2022-12-02 ENCOUNTER — Encounter: Payer: Self-pay | Admitting: Adult Health

## 2022-12-07 NOTE — Telephone Encounter (Signed)
Called pt no answer °

## 2022-12-09 ENCOUNTER — Other Ambulatory Visit: Payer: Self-pay

## 2022-12-09 ENCOUNTER — Ambulatory Visit: Payer: 59 | Admitting: Allergy

## 2022-12-09 ENCOUNTER — Encounter: Payer: Self-pay | Admitting: Allergy

## 2022-12-09 VITALS — BP 118/90 | HR 90 | Temp 98.4°F | Resp 16 | Ht 69.0 in | Wt 336.6 lb

## 2022-12-09 DIAGNOSIS — J3089 Other allergic rhinitis: Secondary | ICD-10-CM | POA: Diagnosis not present

## 2022-12-09 DIAGNOSIS — L501 Idiopathic urticaria: Secondary | ICD-10-CM

## 2022-12-09 DIAGNOSIS — T7800XA Anaphylactic reaction due to unspecified food, initial encounter: Secondary | ICD-10-CM

## 2022-12-09 DIAGNOSIS — K9049 Malabsorption due to intolerance, not elsewhere classified: Secondary | ICD-10-CM

## 2022-12-09 DIAGNOSIS — L299 Pruritus, unspecified: Secondary | ICD-10-CM

## 2022-12-09 DIAGNOSIS — H1013 Acute atopic conjunctivitis, bilateral: Secondary | ICD-10-CM | POA: Diagnosis not present

## 2022-12-09 DIAGNOSIS — J4520 Mild intermittent asthma, uncomplicated: Secondary | ICD-10-CM

## 2022-12-09 NOTE — Progress Notes (Signed)
Follow-up Note  RE: Sydney Bradley MRN: [id number] DOB: 1980-05-11 Date of Office Visit: 12/09/2022   History of present illness: Sydney Bradley is a 42 y.o. female presenting today for follow-up of itching, urticaria, rhinoconjunctivitis, food allergy/intolerance and asthma.  She was last seen in the office on 09/23/2022 by myself.  She doesn't have the constant itching like she used too.  It is more manageable.  She states yesterday in the office she had times where she was itchy.  He is no longer waking up from sleep itching which is great.  She does still have occasional hives here and there however this is a big improvement.  She states yesterday her book bag grazed her arm she was released.  She did have development of hives in that area.  She is taking the Xyzal twice a day, Pepcid twice a day and Singulair daily. Not reporting any significant nasal, ocular or generalized allergy symptoms on the Xyzal and Singulair. She states she does recall headache symptoms after she had new tablet and states she did not like it and with positive hazelnut testing she states that it explains that more now.  She does have access to an epinephrine device. She has not required use of her albuterol since last visit.  Review of systems: 10pt ROS negative unless noted above in HPI  Past medical/social/surgical/family history have been reviewed and are unchanged unless specifically indicated below.  No changes  Medication List: Current Outpatient Medications  Medication Sig Dispense Refill   albuterol (VENTOLIN HFA) 108 (90 Base) MCG/ACT inhaler Inhale 2 puffs into the lungs every 4 (four) hours as needed for wheezing or shortness of breath. 18 g 1   buPROPion (WELLBUTRIN XL) 150 MG 24 hr tablet TAKE ONE TABLET BY MOUTH ONE TIME DAILY 90 tablet 0   clotrimazole-betamethasone (LOTRISONE) cream Apply topically.     diphenhydrAMINE (BENADRYL) 25 MG tablet Take 25 mg by mouth as needed for allergies.       EPINEPHrine 0.3 mg/0.3 mL IJ SOAJ injection Inject 0.3 mg into the muscle as needed for anaphylaxis. 2 each 1   famotidine (PEPCID AC) 10 MG tablet Take 1 tablet (10 mg total) by mouth 2 (two) times daily. 60 tablet 3   fluticasone (FLONASE) 50 MCG/ACT nasal spray Place 2 sprays into both nostrils daily. 16 g 6   hydroquinone 4 % cream Apply 1 Dose topically daily.     levocetirizine (XYZAL) 5 MG tablet Take 1 tablet (5 mg total) by mouth in the morning and at bedtime. 60 tablet 3   montelukast (SINGULAIR) 10 MG tablet Take 1 tablet (10 mg total) by mouth at bedtime. 30 tablet 3   Olopatadine HCl (PATADAY) 0.2 % SOLN 1 drop each eye daily as needed for itchy/watery eyes. 2.5 mL 3   omeprazole (PRILOSEC) 40 MG capsule TAKE ONE CAPSULE BY MOUTH DAILY BEFORE BREAKFAST 90 capsule 0   albuterol (PROVENTIL) (2.5 MG/3ML) 0.083% nebulizer solution Take 3 mLs (2.5 mg total) by nebulization every 4 (four) hours as needed for wheezing or shortness of breath. (Patient not taking: Reported on 12/09/2022) 75 mL 0   cetirizine (ZYRTEC) 10 MG tablet Take 10 mg by mouth daily.  (Patient not taking: Reported on 12/09/2022)     Clindamycin-Benzoyl Per, Refr, gel Apply topically. (Patient not taking: Reported on 12/09/2022)     No current facility-administered medications for this visit.     Known medication allergies: Allergies  Allergen Reactions   Latex Hives  Breathing problems   Other Anaphylaxis and Itching    Nuts Tree nuts   Peanut-Containing Drug Products Anaphylaxis    Allergy to all nuts    Shellfish Allergy Anaphylaxis   Ciprofibrate Hives   Penicillins Hives    Has patient had a PCN reaction causing immediate rash, facial/tongue/throat swelling, SOB or lightheadedness with hypotension: Yes Has patient had a PCN reaction causing severe rash involving mucus membranes or skin necrosis: No Has patient had a PCN reaction that required hospitalization No Has patient had a PCN reaction occurring  within the last 10 years: No If all of the above answers are "NO", then may proceed with Cephalosporin use.    Chocolate Itching   Ciprofloxacin Hives   Lamisil [Terbinafine] Hives   Wheat Itching     Physical examination: Blood pressure (!) 118/90, pulse 90, temperature 98.4 F (36.9 C), temperature source Temporal, resp. rate 16, height 5\' 9"  (1.753 m), weight (!) 336 lb 9.6 oz (152.7 kg), SpO2 96%.  General: Alert, interactive, in no acute distress. HEENT: PERRLA, TMs pearly gray, turbinates non-edematous without discharge, post-pharynx non erythematous. Neck: Supple without lymphadenopathy. Lungs: Clear to auscultation without wheezing, rhonchi or rales. {no increased work of breathing. CV: Normal S1, S2 without murmurs. Abdomen: Nondistended, nontender. Skin: Warm and dry, without lesions or rashes. Extremities:  No clubbing, cyanosis or edema. Neuro:   Grossly intact.  Diagnositics/Labs: Labs:  Component     Latest Ref Rng 09/23/2022  IgE (Immunoglobulin E), Serum     6 - 495 IU/mL 82   IgE (Immunoglobulin E), Serum     6 - 495 IU/mL 85   D Pteronyssinus IgE     Class 0 kU/L <0.10   D Farinae IgE     Class 0 kU/L <0.10   Cat Dander IgE     Class 0 kU/L <0.10   Dog Dander IgE     Class 0 kU/L <0.10   [place] Grass IgE     Class I kU/L 0.38 !   Timothy Grass IgE     Class II kU/L 0.61 !   [place] Grass IgE     Class I kU/L 0.40 !   Cockroach, [place] IgE     Class 0 kU/L <0.10   Penicillium Chrysogen IgE     Class 0 kU/L <0.10   Cladosporium Herbarum IgE     Class 0/I kU/L 0.13 !   Aspergillus Fumigatus IgE     Class 0/I kU/L 0.25 !   Alternaria Alternata IgE     Class I kU/L 0.45 !   Maple/[place] IgE     Class 0 kU/L <0.10   Common Silver Charletta Cousin IgE     Class II kU/L 0.80 !   [place], [place] IgE     Class 0 kU/L <0.10   [place], [place] IgE     Class II kU/L 1.15 !   Elm, American IgE     Class 0 kU/L <0.10   [place] IgE     Class 0 kU/L <0.10    Pecan, [place] IgE     Class 0/I kU/L 0.13 !   White Mulberry IgE     Class 0 kU/L <0.10   Ragweed, Short IgE     Class 0 kU/L <0.10   Pigweed, Rough IgE     Class 0 kU/L <0.10   Sheep Sorrel IgE Qn     Class 0 kU/L <0.10   Mouse Urine IgE     Class  0 kU/L <0.10   WBC     3.4 - 10.8 x10E3/uL 7.3   RBC     3.77 - 5.28 x10E6/uL 4.76   Hemoglobin     11.1 - 15.9 g/dL 87.5   HCT     64.3 - 32.9 % 40.5   MCV     79 - 97 fL 85   MCH     26.6 - 33.0 pg 27.1   MCHC     31.5 - 35.7 g/dL 51.8   RDW     84.1 - 66.0 % 12.7   Neutrophils     Not Estab. % 61   Lymphs     Not Estab. % 30   Monocytes     Not Estab. % 8   Eos     Not Estab. % 1   Basos     Not Estab. % 0   NEUT#     1.4 - 7.0 x10E3/uL 4.5   Lymphocyte #     0.7 - 3.1 x10E3/uL 2.2   Monocytes Absolute     0.1 - 0.9 x10E3/uL 0.6   EOS (ABSOLUTE)     0.0 - 0.4 x10E3/uL 0.1   Basophils Absolute     0.0 - 0.2 x10E3/uL 0.0   Immature Granulocytes     Not Estab. % 0   Immature Grans (Abs)     0.0 - 0.1 x10E3/uL 0.0   Glucose     70 - 99 mg/dL 84   BUN     6 - 24 mg/dL 7   Creatinine     6.30 - 1.00 mg/dL 1.60   eGFR     >10 XN/ATF/5.73 101   BUN/Creatinine Ratio     9 - 23  9   Sodium     134 - 144 mmol/L 142   Potassium     3.5 - 5.2 mmol/L 4.2   Chloride     96 - 106 mmol/L 105   CO2     20 - 29 mmol/L 23   Calcium     8.7 - 10.2 mg/dL 9.2   Total Protein     6.0 - 8.5 g/dL 6.9   Albumin     3.9 - 4.9 g/dL 3.9   Globulin, Total     1.5 - 4.5 g/dL 3.0   Albumin/Globulin Ratio 1.3   Total Bilirubin     0.0 - 1.2 mg/dL 0.3   Alkaline Phosphatase     44 - 121 IU/L 84   AST     0 - 40 IU/L 11   ALT     0 - 32 IU/L 15   F017-IgE Hazelnut ([place])     Class II kU/L 0.76 !   F256-IgE Walnut     Class 0 kU/L <0.10   F202-IgE Cashew Nut     Class 0 kU/L <0.10   F018-IgE [place] Nut     Class 0 kU/L <0.10   Peanut, IgE     Class 0 kU/L <0.10   Macadamia Nut, IgE     Class 0 kU/L  <0.10   Pecan Nut IgE     Class 0 kU/L <0.10   F203-IgE Pistachio Nut     Class 0 kU/L <0.10   F020-IgE Almond     Class 0 kU/L <0.10   Codfish IgE     Class 0 kU/L <0.10   Halibut IgE     Class 0 kU/L <0.10   Allergen [place] IgE     Class 0 kU/L <0.10   Tuna     Class 0 kU/L <0.10   Allergen Salmon IgE     Class 0 kU/L <0.10   Allergen Mackerel IgE     Class 0 kU/L <0.10   Allergen Trout IgE     Class 0 kU/L <0.10   Clam IgE     Class 0 kU/L <0.10   F023-IgE Crab     Class 0 kU/L <0.10   Shrimp IgE     Class 0 kU/L <0.10   Scallop IgE     Class 0 kU/L <0.10   F290-IgE Oyster     Class 0 kU/L <0.10   F080-IgE Lobster     Class 0 kU/L <0.10   O215-IgE Alpha-Gal     Class 0 kU/L <0.10   Beef IgE     Class 0 kU/L <0.10   Pork IgE     Class 0 kU/L <0.10   Allergen Lamb IgE     Class 0 kU/L <0.10   Cor A 1 IgE     Class II kU/L 1.10 !   Cor A 8 IgE     Class 0 kU/L <0.10   Cor A 9 IgE     Class 0 kU/L <0.10   Cor A 14 IgE     Class 0 kU/L <0.10   Thyroperoxidase Ab SerPl-aCnc     0 - 34 IU/mL <9   Thyroglobulin Antibody     0.0 - 0.9 IU/mL <1.0   Wheat IgE     Class 0 kU/L <0.10   Anti Nuclear Antibody (ANA)     Negative  Negative   Sed Rate     0 - 32 mm/hr 65 (H)   Tryptase     2.2 - 13.2 ug/L 7.1   cu index     <10  2.7   TSH     0.450 - 4.500 uIU/mL 2.120   Chocolate/[place] IgE     Class 0 kU/L <0.10   Allergen Comments Note     Assessment and plan: Urticaria and pruritus, chronic - at this time etiology of hives and itching is most likely spontaneous.  Hives can be caused by a variety of different triggers including illness/infection, foods, medications, stings, exercise, pressure, vibrations, extremes of temperature to name a few however majority of the time there is no identifiable trigger.  - your labwork did reveal IgE to Hazelnut (tree nut) as well as to grass and tree pollen.  Avoid hazelnut products.   - continue Xyzal 5mg  1 tab  twice day, Pepcid 10mg  1 tab twice a day and continue Singulair 10mg  daily  - recommend proceeding with Xolair for improved control.  Xolair discussed today including benefits, risk and protocol.  Will have Tammy call you to discuss insurance coverage.   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  May use 15-20  minutes prior to activity.   Monitor frequency of use.    Follow-up in 6 months or sooner if needed  I appreciate the opportunity to take part in Venora's care. Please do not hesitate to contact me with questions.  Sincerely,   Margo Aye, MD Allergy/Immunology [facility]

## 2022-12-09 NOTE — Patient Instructions (Addendum)
Hives and Itching, chronic - at this time etiology of hives and itching is most likely spontaneous.  Hives can be caused by a variety of different triggers including illness/infection, foods, medications, stings, exercise, pressure, vibrations, extremes of temperature to name a few however majority of the time there is no identifiable trigger.  - your labwork did reveal IgE to Hazelnut (tree nut) as well as to grass and tree pollen.  Avoid hazelnut products.   - continue Xyzal 5mg  1 tab twice day, Pepcid 10mg  1 tab twice a day and continue Singulair 10mg  daily  - recommend proceeding with Xolair for improved control.  Xolair discussed today including benefits, risk and protocol.  Will have Tammy call you to discuss insurance coverage.   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  May use 15-20 minutes prior to activity.   Monitor frequency of use.    Follow-up in 6 months or sooner if needed

## 2022-12-11 ENCOUNTER — Other Ambulatory Visit: Payer: Self-pay | Admitting: Internal Medicine

## 2022-12-11 ENCOUNTER — Other Ambulatory Visit: Payer: Self-pay | Admitting: Adult Health

## 2022-12-11 DIAGNOSIS — E6689 Other obesity not elsewhere classified: Secondary | ICD-10-CM

## 2022-12-11 DIAGNOSIS — E668 Other obesity: Secondary | ICD-10-CM

## 2022-12-21 ENCOUNTER — Telehealth: Payer: Self-pay | Admitting: *Deleted

## 2022-12-21 NOTE — Telephone Encounter (Signed)
-----   Message from [facility] sent at 12/09/2022  6:01 PM EDT ----- Patient was to discuss insurance coverage for Xolair for hives.  She will likely proceed if her insurance has good coverage

## 2022-12-21 NOTE — Telephone Encounter (Signed)
Called patient and advised Xolair approval, copay card and submit to [employer]. Will reach out once delivery set to make appt to start therapy

## 2022-12-26 ENCOUNTER — Other Ambulatory Visit: Payer: Self-pay | Admitting: Adult Health

## 2022-12-26 DIAGNOSIS — F419 Anxiety disorder, unspecified: Secondary | ICD-10-CM

## 2023-01-03 ENCOUNTER — Telehealth: Payer: Self-pay | Admitting: *Deleted

## 2023-01-03 NOTE — Telephone Encounter (Signed)
L/m for patient to contact clinic to make appt to start Xolair delivery due 9/24

## 2023-01-10 ENCOUNTER — Ambulatory Visit (INDEPENDENT_AMBULATORY_CARE_PROVIDER_SITE_OTHER): Payer: 59

## 2023-01-10 DIAGNOSIS — L501 Idiopathic urticaria: Secondary | ICD-10-CM | POA: Diagnosis not present

## 2023-01-10 MED ORDER — OMALIZUMAB 150 MG/ML ~~LOC~~ SOSY
300.0000 mg | PREFILLED_SYRINGE | SUBCUTANEOUS | Status: AC
  Administered 2023-01-10 – 2023-02-14 (×2): 300 mg via SUBCUTANEOUS

## 2023-01-10 NOTE — Progress Notes (Signed)
Immunotherapy   Patient Details  Name: Sydney Bradley MRN: [id number] Date of Birth: 09-05-1980  01/10/2023  Sydney Bradley started injections for  hives. Patient received 300 mg of Xolair. Patient waited 30 minutes with no problems.  Frequency:every 28 days Epi-Pen:Epi-Pen Available  Consent signed and patient instructions given.   Dub Mikes 01/10/2023, 2:05 PM

## 2023-02-01 DIAGNOSIS — Z0289 Encounter for other administrative examinations: Secondary | ICD-10-CM

## 2023-02-02 ENCOUNTER — Encounter: Payer: Self-pay | Admitting: Bariatrics

## 2023-02-02 ENCOUNTER — Ambulatory Visit (INDEPENDENT_AMBULATORY_CARE_PROVIDER_SITE_OTHER): Payer: 59 | Admitting: Bariatrics

## 2023-02-02 VITALS — BP 125/85 | HR 84 | Temp 97.8°F | Ht 69.0 in | Wt 340.0 lb

## 2023-02-02 DIAGNOSIS — Z6841 Body Mass Index (BMI) 40.0 and over, adult: Secondary | ICD-10-CM | POA: Diagnosis not present

## 2023-02-02 DIAGNOSIS — M2550 Pain in unspecified joint: Secondary | ICD-10-CM | POA: Diagnosis not present

## 2023-02-02 DIAGNOSIS — E559 Vitamin D deficiency, unspecified: Secondary | ICD-10-CM | POA: Diagnosis not present

## 2023-02-02 DIAGNOSIS — E66813 Obesity, class 3: Secondary | ICD-10-CM

## 2023-02-02 DIAGNOSIS — E669 Obesity, unspecified: Secondary | ICD-10-CM | POA: Diagnosis not present

## 2023-02-02 NOTE — Progress Notes (Signed)
Office: [phone]  /  Fax: [phone]   Initial Visit  Sydney Bradley was seen in clinic today to evaluate for obesity. She is interested in losing weight to improve overall health and reduce the risk of weight related complications. She presents today to review program treatment options, initial physical assessment, and evaluation.     She was referred by: Friend or Family  When asked what else they would like to accomplish? She states: Adopt healthier eating patterns, Improve energy levels and physical activity, Improve existing medical conditions, and Improve quality of life  When asked how has your weight affected you? She states: Contributed to medical problems, Contributed to orthopedic problems or mobility issues, Having fatigue, and Having poor endurance  Some associated conditions: Vitamin D Deficiency and Other: Polyarthalgias  Contributing factors: Family history, Nutritional, Stress, and Eating patterns  Weight promoting medications identified: Steroids  Current nutrition plan: Other: Keto, B12, Weight Watchers, Noom  Current level of physical activity: Other: HITT,   Current or previous pharmacotherapy: Phentermine and Topiramate  Response to medication: Lost weight initially but was unable to sustain weight loss   Past medical history includes:   Past Medical History:  Diagnosis Date   Arthritis    L5 Back - no meds - otc prn   Asthma    Crohn's disease of ileum (HCC)    Hypertension    no meds x 3 yrs   IBS (irritable bowel syndrome)    Morbid obesity with BMI of 45.0-49.9, adult (HCC)    Recurrent upper respiratory infection (URI)    Sacral fracture (HCC)    Urticaria      Objective:   BP 125/85   Pulse 84   Temp 97.8 F (36.6 C)   Ht 5\' 9"  (1.753 m)   Wt (!) 340 lb (154.2 kg)   SpO2 99%   BMI 50.21 kg/m  She was weighed on the bioimpedance scale: Body mass index is 50.21 kg/m.  Peak Weight:350 lbs , Body Fat%:54.8 %, Visceral Fat Rating:19,  Weight trend over the last 12 months: Increasing  General:  Alert, oriented and cooperative. Patient is in no acute distress.  Respiratory: Normal respiratory effort, no problems with respiration noted  Extremities: Normal range of motion.    Mental Status: Normal mood and affect. Normal behavior. Normal judgment and thought content.   DIAGNOSTIC DATA REVIEWED:  BMET    Component Value Date/Time   NA 142 09/23/2022 1115   K 4.2 09/23/2022 1115   CL 105 09/23/2022 1115   CO2 23 09/23/2022 1115   GLUCOSE 84 09/23/2022 1115   GLUCOSE 100 (H) 12/18/2021 0918   BUN 7 09/23/2022 1115   CREATININE 0.75 09/23/2022 1115   CALCIUM 9.2 09/23/2022 1115   GFRNONAA >60 10/12/2014 1500   GFRAA >60 10/12/2014 1500   Lab Results  Component Value Date   HGBA1C 5.6 08/14/2019   HGBA1C 5.8 11/11/2017   No results found for: "INSULIN" CBC    Component Value Date/Time   WBC 7.3 09/23/2022 1115   WBC 6.2 12/18/2021 0918   RBC 4.76 09/23/2022 1115   RBC 4.70 12/18/2021 0918   HGB 12.9 09/23/2022 1115   HCT 40.5 09/23/2022 1115   PLT 244.0 12/18/2021 0918   MCV 85 09/23/2022 1115   MCH 27.1 09/23/2022 1115   MCH 27.5 10/13/2014 0832   MCHC 31.9 09/23/2022 1115   MCHC 32.8 12/18/2021 0918   RDW 12.7 09/23/2022 1115   Iron/TIBC/Ferritin/ %Sat    Component Value  Date/Time   IRON 74 12/18/2021 0918   TIBC 289.8 12/18/2021 0918   FERRITIN 77.6 12/18/2021 0918   IRONPCTSAT 25.5 12/18/2021 0918   Lipid Panel     Component Value Date/Time   CHOL 156 08/14/2019 1610   TRIG 104.0 08/14/2019 1610   HDL 49.00 08/14/2019 1610   CHOLHDL 3 08/14/2019 1610   VLDL 20.8 08/14/2019 1610   LDLCALC 86 08/14/2019 1610   Hepatic Function Panel     Component Value Date/Time   PROT 6.9 09/23/2022 1115   ALBUMIN 3.9 09/23/2022 1115   AST 11 09/23/2022 1115   ALT 15 09/23/2022 1115   ALKPHOS 84 09/23/2022 1115   BILITOT 0.3 09/23/2022 1115   BILIDIR 0.1 11/11/2017 0943   IBILI 0.7 11/25/2009  1416      Component Value Date/Time   TSH 2.120 09/23/2022 1115     Assessment and Plan:   Polyarthralgia  Vitamin D Deficiency Vitamin D is not at goal of 50.  Most recent vitamin D level was 21.48. She is not on vitamin D Lab Results  Component Value Date   VD25OH 21.48 (L) 12/18/2021   Plan:  Will check her vitamin D at the next visit.   Polyarthralgia:   She has s/s intermittently.   Plan: Continue exercise and no pounding actitvities.    Morbid Obesity: Current BMI 50.21    Obesity Treatment / Action Plan:  Patient will work on garnering support from family and friends to begin weight loss journey. Will work on eliminating or reducing the presence of highly palatable, calorie dense foods in the home. Will complete provided nutritional and psychosocial assessment questionnaire before the next appointment. Will be scheduled for indirect calorimetry to determine resting energy expenditure in a fasting state.  This will allow [place] to create a reduced calorie, high-protein meal plan to promote loss of fat mass while preserving muscle mass. Counseled on the health benefits of losing 5%-15% of total body weight. Was counseled on nutritional approaches to weight loss and benefits of reducing processed foods and consuming plant-based foods and high quality protein as part of nutritional weight management. Was counseled on pharmacotherapy and role as an adjunct in weight management.   Obesity Education Performed Today:  She was weighed on the bioimpedance scale and results were discussed and documented in the synopsis.  We discussed obesity as a disease and the importance of a more detailed evaluation of all the factors contributing to the disease.  We discussed the importance of long term lifestyle changes which include nutrition, exercise and behavioral modifications as well as the importance of customizing this to her specific health and social needs.  We discussed the  benefits of reaching a healthier weight to alleviate the symptoms of existing conditions and reduce the risks of the biomechanical, metabolic and psychological effects of obesity.  Discussed New Patient/Late Arrival, and Cancellation Policies. Patient voiced understanding and allowed to ask questions.   Sydney Bradley appears to be in the action stage of change and states they are ready to start intensive lifestyle modifications and behavioral modifications.  30 minutes was spent today on this visit including the above counseling, pre-visit chart review, and post-visit documentation.  Reviewed by clinician on day of visit: allergies, medications, problem list, medical history, surgical history, family history, social history, and previous encounter notes.    Sydney Bradley A. Lorretta HarpO.

## 2023-02-07 ENCOUNTER — Ambulatory Visit: Payer: 59

## 2023-02-08 ENCOUNTER — Ambulatory Visit: Payer: 59 | Admitting: Bariatrics

## 2023-02-08 ENCOUNTER — Encounter: Payer: Self-pay | Admitting: Bariatrics

## 2023-02-08 VITALS — BP 127/89 | HR 72 | Temp 97.5°F | Ht 69.0 in | Wt 343.0 lb

## 2023-02-08 DIAGNOSIS — R7309 Other abnormal glucose: Secondary | ICD-10-CM | POA: Diagnosis not present

## 2023-02-08 DIAGNOSIS — R0602 Shortness of breath: Secondary | ICD-10-CM

## 2023-02-08 DIAGNOSIS — Z6841 Body Mass Index (BMI) 40.0 and over, adult: Secondary | ICD-10-CM

## 2023-02-08 DIAGNOSIS — Z1331 Encounter for screening for depression: Secondary | ICD-10-CM

## 2023-02-08 DIAGNOSIS — M2550 Pain in unspecified joint: Secondary | ICD-10-CM | POA: Diagnosis not present

## 2023-02-08 DIAGNOSIS — Z0000 Encounter for general adult medical examination without abnormal findings: Secondary | ICD-10-CM

## 2023-02-08 DIAGNOSIS — E559 Vitamin D deficiency, unspecified: Secondary | ICD-10-CM | POA: Diagnosis not present

## 2023-02-08 DIAGNOSIS — R030 Elevated blood-pressure reading, without diagnosis of hypertension: Secondary | ICD-10-CM

## 2023-02-08 DIAGNOSIS — E66812 Obesity, class 2: Secondary | ICD-10-CM

## 2023-02-08 DIAGNOSIS — R5383 Other fatigue: Secondary | ICD-10-CM | POA: Diagnosis not present

## 2023-02-09 ENCOUNTER — Encounter: Payer: Self-pay | Admitting: Bariatrics

## 2023-02-09 DIAGNOSIS — R7303 Prediabetes: Secondary | ICD-10-CM | POA: Insufficient documentation

## 2023-02-09 LAB — CBC WITH DIFFERENTIAL/PLATELET
Basophils Absolute: 0.1 10*3/uL (ref 0.0–0.2)
Basos: 1 %
EOS (ABSOLUTE): 0.1 10*3/uL (ref 0.0–0.4)
Eos: 2 %
Hematocrit: 39.7 % (ref 34.0–46.6)
Hemoglobin: 12.8 g/dL (ref 11.1–15.9)
Immature Grans (Abs): 0 10*3/uL (ref 0.0–0.1)
Immature Granulocytes: 0 %
Lymphocytes Absolute: 2.5 10*3/uL (ref 0.7–3.1)
Lymphs: 42 %
MCH: 27 pg (ref 26.6–33.0)
MCHC: 32.2 g/dL (ref 31.5–35.7)
MCV: 84 fL (ref 79–97)
Monocytes Absolute: 0.5 10*3/uL (ref 0.1–0.9)
Monocytes: 9 %
Neutrophils Absolute: 2.8 10*3/uL (ref 1.4–7.0)
Neutrophils: 46 %
Platelets: 263 10*3/uL (ref 150–450)
RBC: 4.74 x10E6/uL (ref 3.77–5.28)
RDW: 12.9 % (ref 11.7–15.4)
WBC: 6 10*3/uL (ref 3.4–10.8)

## 2023-02-09 LAB — LIPID PANEL WITH LDL/HDL RATIO
Cholesterol, Total: 151 mg/dL (ref 100–199)
HDL: 44 mg/dL (ref >39)
LDL Chol Calc (NIH): 90 mg/dL (ref 0–99)
LDL/HDL Ratio: 2 ratio (ref 0.0–3.2)
Triglycerides: 89 mg/dL (ref 0–149)
VLDL Cholesterol Cal: 17 mg/dL (ref 5–40)

## 2023-02-09 LAB — VITAMIN D 25 HYDROXY (VIT D DEFICIENCY, FRACTURES): Vit D, 25-Hydroxy: 21.1 ng/mL — ABNORMAL LOW (ref 30.0–100.0)

## 2023-02-09 LAB — INSULIN, RANDOM: INSULIN: 12.3 u[IU]/mL (ref 2.6–24.9)

## 2023-02-09 LAB — HEMOGLOBIN A1C
Est. average glucose Bld gHb Est-mCnc: 117 mg/dL
Hgb A1c MFr Bld: 5.7 % — ABNORMAL HIGH (ref 4.8–5.6)

## 2023-02-09 NOTE — Progress Notes (Unsigned)
Chief Complaint:   OBESITY Sydney Bradley (MR# [id number]) is a 42 y.o. female who presents for evaluation and treatment of obesity and related comorbidities. Current BMI is Body mass index is 50.65 kg/m. Sydney Bradley has been struggling with her weight for many years and has been unsuccessful in either losing weight, maintaining weight loss, or reaching her healthy weight goal.  Sydney Bradley is currently in the action stage of change and ready to dedicate time achieving and maintaining a healthier weight. Kirk is interested in becoming our patient and working on intensive lifestyle modifications including (but not limited to) diet and exercise for weight loss.  Patient is here for her initial visit after her information session with me on 02/02/2023.  Sydney Bradley's habits were reviewed today and are as follows: Her family eats meals together, she thinks her family will eat healthier with her, her desired weight loss is 93 lbs, she has been heavy most of her life, she started gaining weight after college, her heaviest weight ever was 350 pounds, she has significant food cravings issues, she snacks frequently in the evenings, she skips meals frequently, she is frequently drinking liquids with calories, she frequently makes poor food choices, she has problems with excessive hunger, she frequently eats larger portions than normal, and she struggles with emotional eating.  Depression Screen Sydney Bradley's Food and Mood (modified PHQ-9) score was 12.  Subjective:   1. Other fatigue Sydney Bradley admits to daytime somnolence and admits to waking up still tired. Patient has a history of symptoms of daytime fatigue and morning fatigue. Sydney Bradley generally gets 6 hours of sleep per night, and states that she has nightime awakenings. Snoring is not present. Apneic episodes are not present. Epworth Sleepiness Score is 6.   2. SOB (shortness of breath) on exertion Sydney Bradley notes increasing shortness of breath with exercising and seems to be  worsening over time with weight gain. She notes getting out of breath sooner with activity than she used to. This has not gotten worse recently. Sydney Bradley denies shortness of breath at rest or orthopnea.  3. Vitamin D deficiency Patient is not on vitamin D supplementation.  Her vitamin D level was low at 1 time.  4. Polyarthralgia Patient notes more pain in her knees and back.  5. Health care maintenance Given obesity.   6. Elevated glucose Patient denies a family history.  7. Elevated blood pressure reading Patient is monitoring her blood pressure with her pregnancy.  Assessment/Plan:   1. Other fatigue Sydney Bradley does feel that her weight is causing her energy to be lower than it should be. Fatigue may be related to obesity, depression or many other causes. Labs will be ordered, and in the meanwhile, Sydney Bradley will focus on self care including making healthy food choices, increasing physical activity and focusing on stress reduction.  - EKG 12-Lead - CBC with Differential/Platelet  2. SOB (shortness of breath) on exertion Sydney Bradley does feel that she gets out of breath more easily that she used to when she exercises. Sydney Bradley's shortness of breath appears to be obesity related and exercise induced. She has agreed to work on weight loss and gradually increase exercise to treat her exercise induced shortness of breath. Will continue to monitor closely.  - CBC with Differential/Platelet  3. Vitamin D deficiency We will check labs today, we will follow-up at patient's next visit.  - VITAMIN D 25 Hydroxy (Vit-D Deficiency, Fractures)  4. Polyarthralgia Patient will pick exercises for her back that does not cause issues  with back or knees.  5. Health care maintenance We will check labs today.  EKG and IC were done today and reviewed with the patient.  - Insulin, random - Hemoglobin A1c - Lipid Panel With LDL/HDL Ratio - VITAMIN D 25 Hydroxy (Vit-D Deficiency, Fractures) - CBC with  Differential/Platelet  6. Elevated glucose We will check labs today, we will follow-up at patient's next visit.  - Insulin, random - Hemoglobin A1c  7. Elevated blood pressure reading We will continue to monitor over time.  Patient will work on decreasing sodium.  Seasoning handout was given.  8. Depression screening Sydney Bradley had a positive depression screening. Depression is commonly associated with obesity and often results in emotional eating behaviors. We will monitor this closely and work on CBT to help improve the non-hunger eating patterns. Referral to Psychology may be required if no improvement is seen as she continues in our clinic.  9. Class 2 severe obesity due to excess calories with serious comorbidity in adult, unspecified BMI (HCC) Sydney Bradley is currently in the action stage of change and her goal is to continue with weight loss efforts. I recommend Sydney Bradley begin the structured treatment plan as follows:  She has agreed to the Category 3 Plan -100 calories.  Meal planning was discussed.  Review labs with the patient from 09/23/2022, CMP, CBC, and thyroid panel.  Patient will work on decreasing sweets.  Exercise goals: No exercise has been prescribed at this time.   Behavioral modification strategies: increasing lean protein intake, decreasing simple carbohydrates, increasing vegetables, increasing water intake, decreasing eating out, no skipping meals, meal planning and cooking strategies, keeping healthy foods in the home, and planning for success.  She was informed of the importance of frequent follow-up visits to maximize her success with intensive lifestyle modifications for her multiple health conditions. She was informed we would discuss her lab results at her next visit unless there is a critical issue that needs to be addressed sooner. Sydney Bradley agreed to keep her next visit at the agreed upon time to discuss these results.  Objective:   Blood pressure 127/89, pulse 72,  temperature (!) 97.5 F (36.4 C), height 5\' 9"  (1.753 m), weight (!) 343 lb (155.6 kg), SpO2 99%. Body mass index is 50.65 kg/m.  EKG: Normal sinus rhythm, rate 80 BPM.  Indirect Calorimeter completed today shows a VO2 of 266 and a REE of 1829.  Her calculated basal metabolic rate is 6578 thus her basal metabolic rate is worse than expected.  General: Cooperative, alert, well developed, in no acute distress. HEENT: Conjunctivae and lids unremarkable. Cardiovascular: Regular rhythm.  Lungs: Normal work of breathing. Neurologic: No focal deficits.   Lab Results  Component Value Date   CREATININE 0.75 09/23/2022   BUN 7 09/23/2022   NA 142 09/23/2022   K 4.2 09/23/2022   CL 105 09/23/2022   CO2 23 09/23/2022   Lab Results  Component Value Date   ALT 15 09/23/2022   AST 11 09/23/2022   ALKPHOS 84 09/23/2022   BILITOT 0.3 09/23/2022   Lab Results  Component Value Date   HGBA1C 5.7 (H) 02/08/2023   HGBA1C 5.6 08/14/2019   HGBA1C 5.8 11/11/2017   Lab Results  Component Value Date   INSULIN 12.3 02/08/2023   Lab Results  Component Value Date   TSH 2.120 09/23/2022   Lab Results  Component Value Date   CHOL 151 02/08/2023   HDL 44 02/08/2023   LDLCALC 90 02/08/2023   TRIG 89 02/08/2023  CHOLHDL 3 08/14/2019   Lab Results  Component Value Date   WBC 6.0 02/08/2023   HGB 12.8 02/08/2023   HCT 39.7 02/08/2023   MCV 84 02/08/2023   PLT 263 02/08/2023   Lab Results  Component Value Date   IRON 74 12/18/2021   TIBC 289.8 12/18/2021   FERRITIN 77.6 12/18/2021   Attestation Statements:   Reviewed by clinician on day of visit: allergies, medications, problem list, medical history, surgical history, family history, social history, and previous encounter notes.   Time spent on visit including pre-visit chart review and post-visit charting and care was 40 minutes.    Trude Mcburney, am acting as Energy manager for [employer], DO.  I have reviewed the above  documentation for accuracy and completeness, and I agree with the above. Corinna Capra, DO

## 2023-02-14 ENCOUNTER — Ambulatory Visit: Payer: 59 | Admitting: *Deleted

## 2023-02-14 DIAGNOSIS — L501 Idiopathic urticaria: Secondary | ICD-10-CM | POA: Diagnosis not present

## 2023-02-22 ENCOUNTER — Ambulatory Visit: Payer: 59 | Admitting: Bariatrics

## 2023-02-22 ENCOUNTER — Encounter: Payer: Self-pay | Admitting: Bariatrics

## 2023-02-22 VITALS — BP 122/77 | HR 87 | Temp 98.0°F | Ht 69.0 in | Wt 341.0 lb

## 2023-02-22 DIAGNOSIS — E559 Vitamin D deficiency, unspecified: Secondary | ICD-10-CM | POA: Diagnosis not present

## 2023-02-22 DIAGNOSIS — Z6841 Body Mass Index (BMI) 40.0 and over, adult: Secondary | ICD-10-CM | POA: Diagnosis not present

## 2023-02-22 DIAGNOSIS — R7303 Prediabetes: Secondary | ICD-10-CM | POA: Diagnosis not present

## 2023-02-22 MED ORDER — METFORMIN HCL 500 MG PO TABS: 500.0000 mg | ORAL_TABLET | Freq: Every day | ORAL | 0 refills | Status: DC

## 2023-02-22 NOTE — Progress Notes (Signed)
First follow-up after initial visit.        WEIGHT SUMMARY AND BIOMETRICS  Weight Lost Since Last Visit: 2lb  Weight Gained Since Last Visit: 0   Vitals Temp: 98 F (36.7 C) BP: 122/77 Pulse Rate: 87 SpO2: 97 %   Anthropometric Measurements Height: 5\' 9"  (1.753 m) Weight: (!) 341 lb (154.7 kg) BMI (Calculated): 50.33 Weight at Last Visit: 343lb Weight Lost Since Last Visit: 2lb Weight Gained Since Last Visit: 0 Starting Weight: 343lb Total Weight Loss (lbs): 2 lb (0.907 kg)   Body Composition  Body Fat %: 54.4 % Fat Mass (lbs): 185.8 lbs Muscle Mass (lbs): 147.8 lbs Total Body Water (lbs): 105.6 lbs Visceral Fat Rating : 19   Other Clinical Data Fasting: yes Labs: no Today's Visit #: 2 Starting Date: 02/08/23    OBESITY Sydney Bradley is here to discuss her progress with her obesity treatment plan along with follow-up of her obesity related diagnoses.    Nutrition Plan: the Category 3 plan - 100cal - 50% adherence.  Current exercise: walking  Interim History:  She is down 2 lbs since her last visit.  Eating all of the food on the plan., Protein intake is as prescribed, Is not skipping meals, Meeting protein goals., Water intake is adequate., and Denies polyphagia  Initial positives regarding the dietary plan: She is not skipping as many meals. She is eating fewer carbohydrates. She is eating more vegetables.  Initial challenges regarding  the dietary plan: Always has problems with carbohydrates.   Hunger is moderately controlled.  Cravings are moderately controlled.  Assessment/Plan:   Vitamin D Deficiency Vitamin D is not at goal of 50.  Most recent vitamin D level was 21.1. She is on  prescription ergocalciferol 50,000 IU weekly. Lab Results  Component Value Date   VD25OH 21.1 (L) 02/08/2023   VD25OH 21.48 (L) 12/18/2021     Plan: Refill prescription vitamin D 50,000 IU weekly.   Prediabetes Last A1c was 5.7  Medication(s): none  Lab Results  Component Value Date   HGBA1C 5.7 (H) 02/08/2023   HGBA1C 5.6 08/14/2019   HGBA1C 5.8 11/11/2017   Lab Results  Component Value Date   INSULIN 12.3 02/08/2023    Plan: Information sheet on " Insulin Resistance and Prediabetes." Will minimize all refined carbohydrates both sweets and starches.  Will work on the plan and exercise.  Consider both aerobic and resistance training.  Will keep protein, water, and fiber intake high.  Increase Polyunsaturated and Monounsaturated fats to increase satiety and encourage weight loss.  Aim for 7 to 9 hours of sleep nightly.  Will use measuring cups, and read food labels.   Start Metformin 500 mg once daily breakfast    Morbid Obesity: Current BMI BMI (Calculated): 50.33    Sydney Bradley is currently in the action stage of change. As such, her goal is to continue with weight loss efforts.  She has agreed to the Category 3 plan + 100 calories  Exercise goals: All adults should avoid inactivity. Some physical activity is better than none, and adults who participate in any amount of physical activity gain some health benefits.  Behavioral modification strategies: increasing lean protein intake, meal planning , increase water intake, planning for success, keep healthy foods in the home, and mindful eating.  Sydney Bradley has agreed to follow-up with our clinic in 2 weeks.    Labs reviewed today from last visit ( Lipids, CBC, HgbA1c, insulin, vitamin D, and thyroid panel).   Objective:  VITALS: Per patient if applicable, see vitals. GENERAL: Alert and in no acute distress. CARDIOPULMONARY: No increased WOB. Speaking in clear sentences.  PSYCH: Pleasant and cooperative. Speech normal rate and rhythm. Affect is appropriate. Insight and judgement are appropriate. Attention is focused, linear, and appropriate.  NEURO: Oriented as  arrived to appointment on time with no prompting.   Attestation Statements:    This was prepared with the assistance of Engineer, civil (consulting).  Occasional wrong-word or sound-a-like substitutions may have occurred due to the inherent limitations of voice recognition software.   Corinna Capra, DO

## 2023-03-03 ENCOUNTER — Other Ambulatory Visit: Payer: Self-pay | Admitting: Bariatrics

## 2023-03-03 MED ORDER — VITAMIN D (ERGOCALCIFEROL) 1.25 MG (50000 UNIT) PO CAPS: 50000.0000 [IU] | ORAL_CAPSULE | ORAL | 0 refills | Status: DC

## 2023-03-04 ENCOUNTER — Telehealth: Payer: Self-pay | Admitting: *Deleted

## 2023-03-04 NOTE — Telephone Encounter (Signed)
Called patient and advised issue with her Xolair copay card and needs to reach out to them so she can get straightened out and they will pay the whole amount

## 2023-03-09 ENCOUNTER — Ambulatory Visit (INDEPENDENT_AMBULATORY_CARE_PROVIDER_SITE_OTHER): Payer: 59 | Admitting: Bariatrics

## 2023-03-09 ENCOUNTER — Encounter: Payer: Self-pay | Admitting: Bariatrics

## 2023-03-09 VITALS — BP 125/79 | HR 87 | Temp 97.8°F | Ht 69.0 in | Wt 336.0 lb

## 2023-03-09 DIAGNOSIS — R7303 Prediabetes: Secondary | ICD-10-CM | POA: Diagnosis not present

## 2023-03-09 DIAGNOSIS — Z6841 Body Mass Index (BMI) 40.0 and over, adult: Secondary | ICD-10-CM

## 2023-03-09 NOTE — Progress Notes (Signed)
WEIGHT SUMMARY AND BIOMETRICS  Weight Lost Since Last Visit: 5lb  Weight Gained Since Last Visit: 0   Vitals Temp: 97.8 F (36.6 C) BP: 125/79 Pulse Rate: 87 SpO2: 98 %   Anthropometric Measurements Height: 5\' 9"  (1.753 m) Weight: (!) 336 lb (152.4 kg) BMI (Calculated): 49.6 Weight at Last Visit: 341lb Weight Lost Since Last Visit: 5lb Weight Gained Since Last Visit: 0 Starting Weight: 343lb Total Weight Loss (lbs): 7 lb (3.175 kg)   Body Composition  Body Fat %: 55 % Fat Mass (lbs): 185 lbs Muscle Mass (lbs): 143.6 lbs Total Body Water (lbs): 106.2 lbs Visceral Fat Rating : 19   Other Clinical Data Fasting: yes Labs: no Today's Visit #: 3 Starting Date: 02/08/23    OBESITY Sydney Bradley is here to discuss her progress with her obesity treatment plan along with follow-up of her obesity related diagnoses.    Nutrition Plan: the Category 3 plan + 100 calories - 60-70% adherence.  Current exercise: walking  Interim History:  She is down 5 lbs since her last visit.  Eating all of the food on the plan., Is not skipping meals, and Water intake is adequate.   Pharmacotherapy: Sydney Bradley is on Metformin 500 mg once daily breakfast (currently taking at night to avoid stomach upset).  Adverse side effects: Diarrhea Hunger is moderately controlled.  Cravings are moderately controlled.  Assessment/Plan:   Prediabetes Last A1c was 5.7  Medication(s): Metformin 500 mg at night.   Lab Results  Component Value Date   HGBA1C 5.7 (H) 02/08/2023   HGBA1C 5.6 08/14/2019   HGBA1C 5.8 11/11/2017   Lab Results  Component Value Date   INSULIN 12.3 02/08/2023    Plan: Will minimize all refined carbohydrates both sweets and starches.  Will work on the plan and exercise.  Consider both aerobic and resistance training.  Will keep protein, water, and fiber intake  high.  Will continue Metformin at night.     Morbid Obesity: Current BMI BMI (Calculated): 49.6   Pharmacotherapy Plan Continue  Metformin 500 mg at night.   Sydney Bradley is currently in the action stage of change. As such, her goal is to continue with weight loss efforts.  She has agreed to the Category 3 plan.+ 100 calories  Exercise goals: All adults should avoid inactivity. Some physical activity is better than none, and adults who participate in any amount of physical activity gain some health benefits.  Behavioral modification strategies: increasing lean protein intake, decreasing simple carbohydrates , no meal skipping, meal planning , planning for success, avoiding temptations, keep healthy foods in the home, holiday eating strategies , weigh protein portions, and mindful eating.  Sydney Bradley has agreed to follow-up with our clinic in 4 weeks.    Objective:   VITALS: Per patient if applicable, see vitals. GENERAL: Alert and in no acute distress. CARDIOPULMONARY: No increased WOB. Speaking  in clear sentences.  PSYCH: Pleasant and cooperative. Speech normal rate and rhythm. Affect is appropriate. Insight and judgement are appropriate. Attention is focused, linear, and appropriate.  NEURO: Oriented as arrived to appointment on time with no prompting.   Attestation Statements:   This was prepared with the assistance of Engineer, civil (consulting).  Occasional wrong-word or sound-a-like substitutions may have occurred due to the inherent limitations of voice recognition   Corinna Capra, DO

## 2023-03-14 ENCOUNTER — Ambulatory Visit: Payer: 59

## 2023-03-14 NOTE — Telephone Encounter (Signed)
Unfortunately her copay is high and in order to get them to pay if he does have funds is for her to contact copay card

## 2023-03-14 NOTE — Telephone Encounter (Signed)
I spoke with patient and she called the phone number provided. She states that they said that she needed to speak with variable copay and provided their phone number. They said that she would need to be enrolled in order to take care of other portion if processed but the woman on the phone told her she wasn't sure what they were talking about and why they sent her to them. Patient states that she is very frustrated and almost wants to stop trying Xolair since this is such a hassle. Is there any way you can help, Tammy?

## 2023-03-15 NOTE — Telephone Encounter (Signed)
Can you please call the patient to discuss this with her? There was a lot of confusion between her and the copay company and I am unable to assist as thoroughly.

## 2023-03-16 NOTE — Telephone Encounter (Signed)
Called copay card program and they advised that she has another copay card program through her Ins have already spoke to her regarding same. Unfortunately the person she spoke to for the copay card info wasn't helpful and didn't know what she is talking about. I am unable to call them to get that info and advised her to reach back out to Ins and then give copay card info to pharmacy

## 2023-04-14 ENCOUNTER — Encounter: Payer: Self-pay | Admitting: Bariatrics

## 2023-04-14 ENCOUNTER — Ambulatory Visit: Payer: 59 | Admitting: Bariatrics

## 2023-04-14 VITALS — BP 100/78 | HR 80 | Temp 97.8°F | Ht 69.0 in | Wt 336.0 lb

## 2023-04-14 DIAGNOSIS — R632 Polyphagia: Secondary | ICD-10-CM

## 2023-04-14 DIAGNOSIS — Z6841 Body Mass Index (BMI) 40.0 and over, adult: Secondary | ICD-10-CM

## 2023-04-14 DIAGNOSIS — R7303 Prediabetes: Secondary | ICD-10-CM | POA: Diagnosis not present

## 2023-04-14 DIAGNOSIS — E559 Vitamin D deficiency, unspecified: Secondary | ICD-10-CM | POA: Diagnosis not present

## 2023-04-14 MED ORDER — VITAMIN D (ERGOCALCIFEROL) 1.25 MG (50000 UNIT) PO CAPS: 50000.0000 [IU] | ORAL_CAPSULE | ORAL | 0 refills | Status: DC

## 2023-04-14 MED ORDER — WEGOVY 0.25 MG/0.5ML ~~LOC~~ SOAJ: 0.2500 mg | SUBCUTANEOUS | 0 refills | Status: DC

## 2023-04-14 MED ORDER — METFORMIN HCL 500 MG PO TABS: 500.0000 mg | ORAL_TABLET | Freq: Every day | ORAL | 0 refills | Status: DC

## 2023-04-14 NOTE — Progress Notes (Signed)
 WEIGHT SUMMARY AND BIOMETRICS  Weight Lost Since Last Visit: 0  Weight Gained Since Last Visit: 0   Vitals Temp: 97.8 F (36.6 C) BP: 100/78 Pulse Rate: 80 SpO2: 100 %   Anthropometric Measurements Height: 5' 9 (1.753 m) Weight: (!) 336 lb (152.4 kg) BMI (Calculated): 49.6 Weight at Last Visit: 336lb Weight Lost Since Last Visit: 0 Weight Gained Since Last Visit: 0 Starting Weight: 343lb Total Weight Loss (lbs): 7 lb (3.175 kg)   Body Composition  Body Fat %: 54.2 % Fat Mass (lbs): 182.6 lbs Muscle Mass (lbs): 146.4 lbs Total Body Water (lbs): 104.6 lbs Visceral Fat Rating : 19   Other Clinical Data Fasting: no Labs: no Today's Visit #: 4 Starting Date: 02/08/23    OBESITY Sydney Bradley is here to discuss her progress with her obesity treatment plan along with follow-up of her obesity related diagnoses.    Nutrition Plan: the Category 3 plan + 100 calories - 50% adherence.  Current exercise: none  Interim History:  Her weight remains the same since the last visit. Eating all of the food on the plan., Protein intake is as prescribed, and Water intake is adequate.   Pharmacotherapy: Sydney Bradley is on Metformin  500 mg once daily breakfast Adverse side effects: None Hunger is moderately controlled.  Cravings are moderately controlled.  Assessment/Plan:   Vitamin D  Deficiency Vitamin D  is at goal of 50.  Most recent vitamin D  level was 21.1. She is on  prescription ergocalciferol  50,000 IU weekly. Lab Results  Component Value Date   VD25OH 21.1 (L) 02/08/2023   VD25OH 21.48 (L) 12/18/2021    Plan: Refill prescription vitamin D  50,000 IU weekly.   Prediabetes Last A1c was 5.7  Medication(s): Metformin  500 mg once daily breakfast Lab Results  Component Value Date   HGBA1C 5.7 (H) 02/08/2023   HGBA1C 5.6 08/14/2019   HGBA1C 5.8 11/11/2017    Lab Results  Component Value Date   INSULIN  12.3 02/08/2023    Plan: Will minimize all refined carbohydrates both sweets and starches.  Will work on the plan and exercise.  Consider both aerobic and resistance training.  Will keep protein, water, and fiber intake high.  Increase Polyunsaturated and Monounsaturated fats to increase satiety and encourage weight loss.  Aim for 7 to 9 hours of sleep nightly.   Continue Metformin  500 mg once daily breakfast   Polyphagia Sydney Bradley endorses excessive hunger.  Medication(s): Metformin   Effects of medication:  moderately controlled. Cravings are moderately controlled.   Plan: Medication(s): Wegovy  0.25 mg SQ weekly Will increase water, protein and fiber to help assuage hunger.  Will minimize foods that have a high glucose index/load to minimize reactive hypoglycemia.  She will continue to plan her meals.    Morbid Obesity: Current BMI BMI (Calculated): 49.6   Pharmacotherapy Plan Continue  Metformin  500 mg once daily breakfast Will begin  Wegovy  at 0.25 mg. Rx sent for 1 month.   Sydney Bradley is currently in the action stage of change. As such, her goal is to continue with weight loss efforts.  She has agreed to the Category 3 plan. + 100 calories.   Exercise goals: All adults should avoid inactivity. Some physical activity is better than none, and adults who participate in any amount of physical activity gain some health benefits.  Behavioral modification strategies: increasing lean protein intake, meal planning , increase water intake, better snacking choices, increasing vegetables, avoiding temptations, and mindful eating.  Sydney Bradley has agreed to follow-up with our clinic in 2 weeks.      Objective:   VITALS: Per patient if applicable, see vitals. GENERAL: Alert and in no acute distress. CARDIOPULMONARY: No increased WOB. Speaking in clear sentences.  PSYCH: Pleasant and cooperative. Speech normal rate and rhythm. Affect is  appropriate. Insight and judgement are appropriate. Attention is focused, linear, and appropriate.  NEURO: Oriented as arrived to appointment on time with no prompting.   Attestation Statements:    This was prepared with the assistance of Engineer, Civil (consulting).  Occasional wrong-word or sound-a-like substitutions may have occurred due to the inherent limitations of voice recognition   Clayborne Daring, DO

## 2023-04-15 ENCOUNTER — Other Ambulatory Visit: Payer: Self-pay | Admitting: *Deleted

## 2023-04-15 ENCOUNTER — Telehealth: Payer: Self-pay | Admitting: *Deleted

## 2023-04-15 ENCOUNTER — Encounter: Payer: Self-pay | Admitting: Allergy

## 2023-04-15 MED ORDER — PREDNISONE 20 MG PO TABS: 20.0000 mg | ORAL_TABLET | Freq: Every day | ORAL | 0 refills | Status: AC

## 2023-04-15 NOTE — Telephone Encounter (Signed)
 MyChart message sent to patient.

## 2023-04-15 NOTE — Telephone Encounter (Signed)
 Dr. Delorse Lek wanted to know are we able to provide the patient with Xolair samples until she gets her insurance straightened out since she is having hives outbreaks? I just wanted to check with you first.

## 2023-04-15 NOTE — Telephone Encounter (Signed)
 Per Tammy in regards to receiving Xolair samples.

## 2023-04-15 NOTE — Telephone Encounter (Signed)
 There is nothing I can do to fix the issue with her Ins she has to reach out to them to find out what the copay card her Ins provides in order to get copay card for Xolair  to go through. Right now since she has not resolved in the last couple months dont know we can bridge her since she has not gotten anywhere with her Ins. Right now we are using most sample to bridge PAP patients that have delay in getting deliveries due to awaiting approvals

## 2023-04-18 ENCOUNTER — Other Ambulatory Visit: Payer: Self-pay | Admitting: Internal Medicine

## 2023-04-18 ENCOUNTER — Telehealth: Payer: Self-pay

## 2023-04-18 IMAGING — DX DG TOE GREAT 2+V*L*
3 series · 3 of 3 positions shown · non-contrast
Comparison: None Available.

CLINICAL DATA: Blunt trauma.

EXAM:
LEFT GREAT TOE

[toe ap]
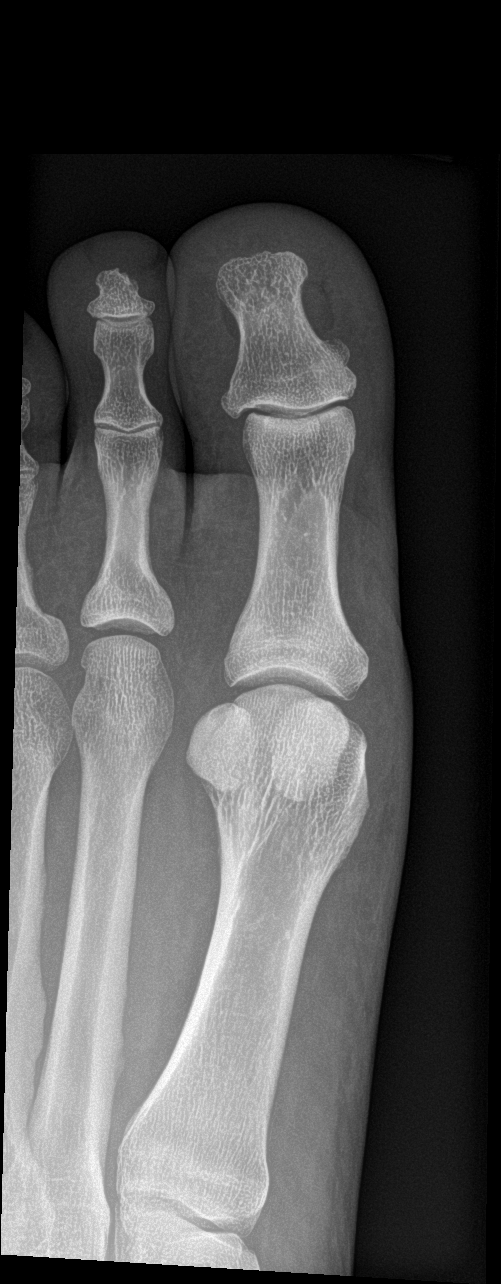

[toe obl]
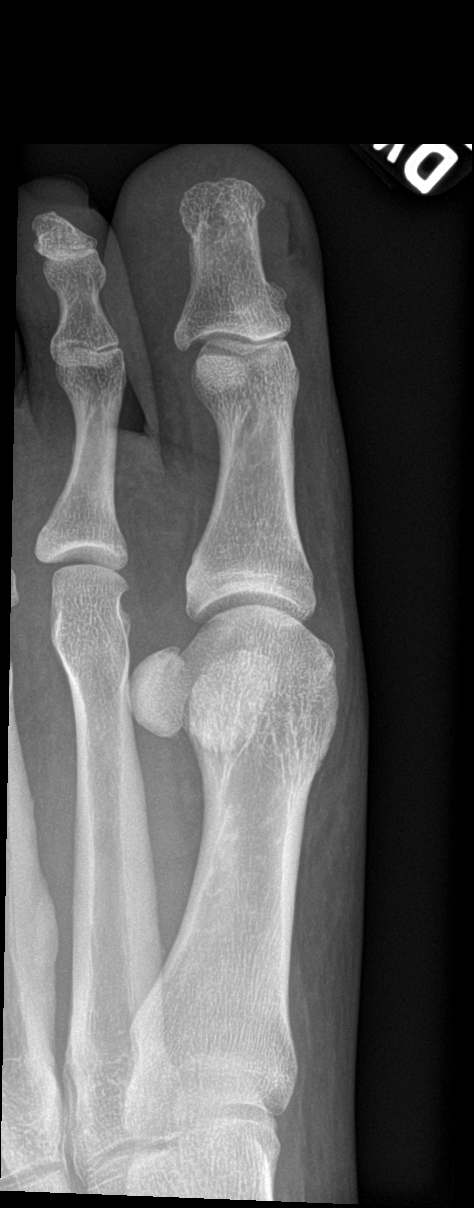

[toe lat]
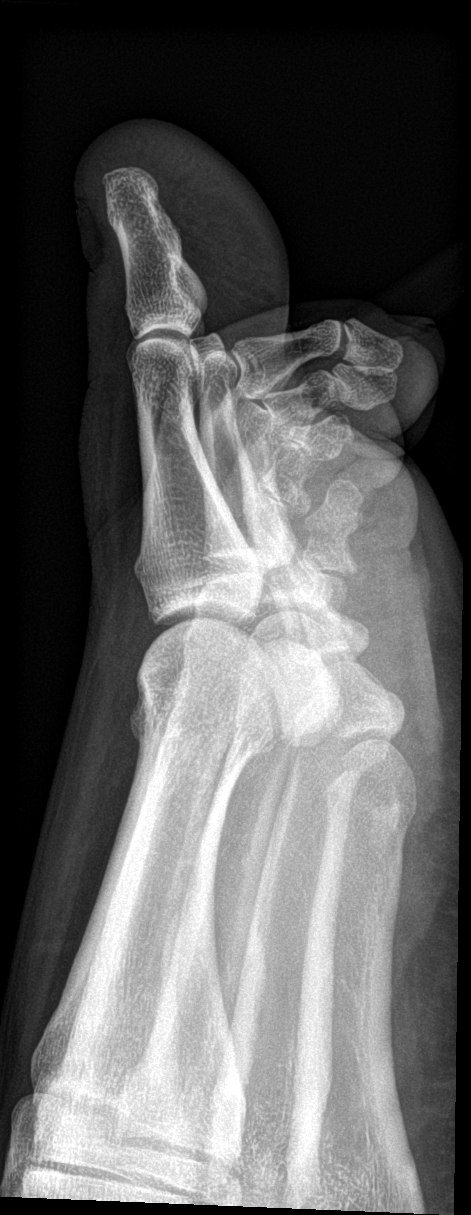

[3 of 3 positions shown; findings below may reference images not displayed]

FINDINGS: There is no evidence of fracture or dislocation. There is no
evidence of arthropathy or other focal bone abnormality. Soft
tissues are unremarkable.
IMPRESSION: No fracture dislocation of the great toe.

## 2023-04-18 NOTE — Telephone Encounter (Signed)
 Started PA for [employer] via covermymeds

## 2023-04-19 NOTE — Telephone Encounter (Signed)
 Sydney Bradley denied due to the requested medication and/or diagnosis are not a covered benefit and excluded from coverage in accordance with the terms and conditions of your plan benefit.

## 2023-04-23 ENCOUNTER — Other Ambulatory Visit: Payer: Self-pay | Admitting: Allergy

## 2023-04-23 ENCOUNTER — Other Ambulatory Visit: Payer: Self-pay | Admitting: Adult Health

## 2023-04-23 DIAGNOSIS — F419 Anxiety disorder, unspecified: Secondary | ICD-10-CM

## 2023-04-28 ENCOUNTER — Ambulatory Visit: Payer: 59 | Admitting: Bariatrics

## 2023-04-28 ENCOUNTER — Encounter: Payer: Self-pay | Admitting: Bariatrics

## 2023-04-28 VITALS — BP 138/83 | HR 97 | Temp 97.6°F | Ht 69.0 in | Wt 338.0 lb

## 2023-04-28 DIAGNOSIS — R7303 Prediabetes: Secondary | ICD-10-CM | POA: Diagnosis not present

## 2023-04-28 DIAGNOSIS — Z6841 Body Mass Index (BMI) 40.0 and over, adult: Secondary | ICD-10-CM

## 2023-04-28 DIAGNOSIS — R632 Polyphagia: Secondary | ICD-10-CM | POA: Diagnosis not present

## 2023-04-28 MED ORDER — VITAMIN D (ERGOCALCIFEROL) 1.25 MG (50000 UNIT) PO CAPS: 50000.0000 [IU] | ORAL_CAPSULE | ORAL | 0 refills | Status: DC

## 2023-04-28 MED ORDER — QSYMIA 7.5-46 MG PO CP24: ORAL_CAPSULE | ORAL | 0 refills | Status: DC

## 2023-04-28 MED ORDER — QSYMIA 3.75-23 MG PO CP24: ORAL_CAPSULE | ORAL | 0 refills | Status: DC

## 2023-04-28 NOTE — Progress Notes (Signed)
WEIGHT SUMMARY AND BIOMETRICS  Weight Lost Since Last Visit: 0  Weight Gained Since Last Visit: 2lb   Vitals Temp: 97.6 F (36.4 C) BP: 138/83 Pulse Rate: 97 SpO2: 100 %   Anthropometric Measurements Height: 5\' 9"  (1.753 m) Weight: (!) 338 lb (153.3 kg) BMI (Calculated): 49.89 Weight at Last Visit: 336lb Weight Lost Since Last Visit: 0 Weight Gained Since Last Visit: 2lb Starting Weight: 343lb Total Weight Loss (lbs): 5 lb (2.268 kg)   Body Composition  Body Fat %: 55.4 % Fat Mass (lbs): 187.6 lbs Muscle Mass (lbs): 143.6 lbs Total Body Water (lbs): 108.6 lbs Visceral Fat Rating : 19   Other Clinical Data Fasting: no Labs: no Today's Visit #: 5 Starting Date: 02/08/23    OBESITY Sydney Bradley is here to discuss her progress with her obesity treatment plan along with follow-up of her obesity related diagnoses.    Nutrition Plan: the Category 3 plan +100 calories- 50% adherence.  Current exercise: none  Interim History:  She is down 2 lbs since her last visit.  Eating all of the food on the plan., Protein intake is as prescribed, Is not skipping meals, Water intake is adequate., and Reports polyphagia   Pharmacotherapy: Sydney Bradley iwas tried on Wegovy but neither her insurance or her spouse's would cover the medication.  Hunger is moderately controlled.  Cravings are moderately controlled.  Assessment/Plan:   Polyphagia Sydney Bradley endorses excessive hunger.  Medication(s): none.  Effects of medication:  moderately controlled. Cravings are moderately controlled.   Plan: Medication(s): She was started on the Wegovy but her insurance would not pay.  Will increase water, protein and fiber to help assuage hunger.  Will minimize foods that have a high glucose index/load to minimize reactive hypoglycemia.  Will start Qsymia today. Checked the PDMP, and  controlled medication sheet for Phentermine/Qsymia reviewed and signed with the patient. She denies contraindications. Benefits and risks were discussed.   Rx: Qsymia 3.75/23 mg capsule 1 daily # 14 with no refills. Qsymia 7.5/46 mg 1 daily # 30 with no refills. Sent to local pharmacy.    Prediabetes Last A1c was 5.7  Medication(s): Metformin 500 mg once daily breakfast Lab Results  Component Value Date   HGBA1C 5.7 (H) 02/08/2023   HGBA1C 5.6 08/14/2019   HGBA1C 5.8 11/11/2017   Lab Results  Component Value Date   INSULIN 12.3 02/08/2023    Plan: Will minimize all refined carbohydrates both sweets and starches.  Will work on the plan and exercise.  Consider both aerobic and resistance training.  Will keep protein, water, and fiber intake high.  Aim for 7 to 9 hours of sleep nightly.  Start Qsymia 7.5/46 mg 1 capsule by mouth daily in am     Morbid Obesity: Current BMI BMI (Calculated): 49.89   Pharmacotherapy Plan Start  Qsymia 7.5/46 mg 1 capsule by mouth daily in  am  Sydney Bradley is currently in the action stage of change. As such, her goal is to continue with weight loss efforts.  She has agreed to the Category 3 plan+ 100 calories.   Exercise goals: For substantial health benefits, adults should do at least 150 minutes (2 hours and 30 minutes) a week of moderate-intensity, or 75 minutes (1 hour and 15 minutes) a week of vigorous-intensity aerobic physical activity, or an equivalent combination of moderate- and vigorous-intensity aerobic activity. Aerobic activity should be performed in episodes of at least 10 minutes, and preferably, it should be spread throughout the week.  Behavioral modification strategies: increasing lean protein intake, no meal skipping, decrease eating out, meal planning , increase water intake, better snacking choices, and planning for success.  Trevon has agreed to follow-up with our clinic in 3 weeks.      Objective:   VITALS: Per patient if  applicable, see vitals. GENERAL: Alert and in no acute distress. CARDIOPULMONARY: No increased WOB. Speaking in clear sentences.  PSYCH: Pleasant and cooperative. Speech normal rate and rhythm. Affect is appropriate. Insight and judgement are appropriate. Attention is focused, linear, and appropriate.  NEURO: Oriented as arrived to appointment on time with no prompting.   Attestation Statements:   This was prepared with the assistance of Engineer, civil (consulting).  Occasional wrong-word or sound-a-like substitutions may have occurred due to the inherent limitations of voice recognition   Corinna Capra, DO

## 2023-05-02 ENCOUNTER — Telehealth: Payer: Self-pay

## 2023-05-02 NOTE — Telephone Encounter (Signed)
Started PA for Qsymia via covermymeds. 

## 2023-05-02 NOTE — Telephone Encounter (Signed)
Qsymia denied per insurance not a covered benefit.

## 2023-05-13 ENCOUNTER — Other Ambulatory Visit: Payer: Self-pay | Admitting: Bariatrics

## 2023-05-13 DIAGNOSIS — E66813 Obesity, class 3: Secondary | ICD-10-CM

## 2023-05-13 DIAGNOSIS — R7303 Prediabetes: Secondary | ICD-10-CM

## 2023-05-16 ENCOUNTER — Other Ambulatory Visit: Payer: Self-pay | Admitting: Allergy

## 2023-05-18 ENCOUNTER — Encounter: Payer: Self-pay | Admitting: Internal Medicine

## 2023-05-18 MED ORDER — LEVOCETIRIZINE DIHYDROCHLORIDE 5 MG PO TABS: ORAL_TABLET | ORAL | 0 refills | Status: DC

## 2023-05-18 MED ORDER — PREDNISONE 20 MG PO TABS: 40.0000 mg | ORAL_TABLET | Freq: Every day | ORAL | 0 refills | Status: AC

## 2023-05-18 NOTE — Progress Notes (Signed)
 Patient called with hives flare, recently denied Xolair  coverage.  Ran out of levocetirozine.  Last follow-up with Dr Jeneal rescheduled.  Denies other symptoms.   Plan: Prednisone  40 mg x 5 days Levocetirizine 10 mg BID  Montelukast  10 mg nightly  Call to schedule follow-up with Dr Jeneal for preventive plan.

## 2023-05-24 ENCOUNTER — Encounter: Payer: Self-pay | Admitting: Adult Health

## 2023-05-24 ENCOUNTER — Ambulatory Visit: Payer: 59 | Admitting: Adult Health

## 2023-05-24 VITALS — BP 130/88 | HR 79 | Temp 98.8°F | Ht 69.0 in | Wt 339.0 lb

## 2023-05-24 DIAGNOSIS — F419 Anxiety disorder, unspecified: Secondary | ICD-10-CM | POA: Diagnosis not present

## 2023-05-24 MED ORDER — ALPRAZOLAM 0.25 MG PO TABS: 0.2500 mg | ORAL_TABLET | Freq: Three times a day (TID) | ORAL | 0 refills | Status: AC | PRN

## 2023-05-24 MED ORDER — BUPROPION HCL ER (XL) 300 MG PO TB24: 300.0000 mg | ORAL_TABLET | Freq: Every day | ORAL | 0 refills | Status: DC
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
## 2023-05-26 ENCOUNTER — Encounter: Payer: Self-pay | Admitting: Bariatrics

## 2023-05-26 ENCOUNTER — Ambulatory Visit: Payer: 59 | Admitting: Bariatrics

## 2023-05-26 VITALS — BP 127/80 | HR 98 | Temp 97.4°F | Ht 69.0 in | Wt 340.0 lb

## 2023-05-26 DIAGNOSIS — E66813 Obesity, class 3: Secondary | ICD-10-CM

## 2023-05-26 DIAGNOSIS — R7303 Prediabetes: Secondary | ICD-10-CM

## 2023-05-26 DIAGNOSIS — Z6841 Body Mass Index (BMI) 40.0 and over, adult: Secondary | ICD-10-CM

## 2023-05-26 DIAGNOSIS — R632 Polyphagia: Secondary | ICD-10-CM | POA: Diagnosis not present

## 2023-05-26 MED ORDER — TOPIRAMATE 100 MG PO TABS
100.0000 mg | ORAL_TABLET | Freq: Every day | ORAL | 0 refills | Status: DC
Start: 2023-05-26 — End: 2023-06-28

## 2023-05-26 MED ORDER — METFORMIN HCL 500 MG PO TABS: 500.0000 mg | ORAL_TABLET | Freq: Every day | ORAL | 0 refills | Status: DC

## 2023-05-26 MED ORDER — VITAMIN D (ERGOCALCIFEROL) 1.25 MG (50000 UNIT) PO CAPS: 50000.0000 [IU] | ORAL_CAPSULE | ORAL | 0 refills | Status: DC

## 2023-05-26 MED ORDER — PHENTERMINE HCL 37.5 MG PO CAPS: 37.5000 mg | ORAL_CAPSULE | ORAL | 0 refills | Status: DC

## 2023-05-26 NOTE — Progress Notes (Signed)
WEIGHT SUMMARY AND BIOMETRICS  Weight Lost Since Last Visit: 0  Weight Gained Since Last Visit: 2lb   Vitals Temp: (!) 97.4 F (36.3 C) BP: 127/80 Pulse Rate: 98 SpO2: 99 %   Anthropometric Measurements Height: 5\' 9"  (1.753 m) Weight: (!) 340 lb (154.2 kg) BMI (Calculated): 50.19 Weight at Last Visit: 338lb Weight Lost Since Last Visit: 0 Weight Gained Since Last Visit: 2lb Starting Weight: 343lb Total Weight Loss (lbs): 3 lb (1.361 kg)   Body Composition  Body Fat %: 55.2 % Fat Mass (lbs): 187.6 lbs Muscle Mass (lbs): 144.8 lbs Total Body Water (lbs): 109.4 lbs Visceral Fat Rating : 19   Other Clinical Data Fasting: no Labs: no Today's Visit #: 6 Starting Date: 02/08/23    OBESITY Sydney Bradley is here to discuss her progress with her obesity treatment plan along with follow-up of her obesity related diagnoses.    Nutrition Plan: the Category 3 plan + 100 calories - 50% adherence.  Current exercise: walking  Interim History:  She is up 2 lbs since her last visit.  Eating all of the food on the plan., Protein intake is as prescribed, Water intake is adequate., and Reports polyphagia   Pharmacotherapy: Sydney Bradley prescribed  Qsymia 7.5/46 mg 1 capsule by mouth daily in am, but didi not get the medication secondary to the cost.   Hunger is moderately controlled.  Cravings are moderately controlled.  Assessment/Plan:   Polyphagia Sydney Bradley endorses excessive hunger.  Medication(s): none Appetite:  moderately controlled. Cravings are moderately controlled.   Plan: Medication(s): Topiramate 100 mg in the evening  and Phentermine 37.5 mg by mouth daily in am Will increase water, protein and fiber to help assuage hunger.  Will minimize foods that have a high glucose index/load to minimize reactive hypoglycemia.   Will start Phentermine today. Checked  the PDMP, and controlled medication sheet for Phentermine, reviewed and signed with the patient. She denies contraindications. Benefits and risks were discussed.    Prediabetes Last A1c was 5.7  Medication(s): none Lab Results  Component Value Date   HGBA1C 5.7 (H) 02/08/2023   HGBA1C 5.6 08/14/2019   HGBA1C 5.8 11/11/2017   Lab Results  Component Value Date   INSULIN 12.3 02/08/2023    Plan: Will minimize all refined carbohydrates both sweets and starches.  Will work on the plan and exercise.  Consider both aerobic and resistance training.  Will keep protein, water, and fiber intake high.  Increase Polyunsaturated and Monounsaturated fats to increase satiety and encourage weight loss.  Aim for 7 to 9 hours of sleep nightly.     Morbid Obesity: Current BMI BMI (Calculated): 50.19   Pharmacotherapy Plan Start  Phentermine 37.5 mg by mouth daily in am + 100 calories  Sydney Bradley is currently in the action stage of change. As such, her goal is to continue with weight loss efforts.  She has agreed to the Category 3 plan.  Exercise goals: All adults should avoid inactivity. Some physical activity is better than none, and adults who participate in any amount of physical activity gain some health benefits.  Behavioral modification strategies: increasing lean protein intake, no meal skipping, meal planning , increase water intake, better snacking choices, planning for success, and avoiding temptations.  Sydney Bradley has agreed to follow-up with our clinic in 4 weeks.   No orders of the defined types were placed in this encounter.   Medications Discontinued During This Encounter  Medication Reason   Semaglutide-Weight Management (WEGOVY) 0.25 MG/0.5ML SOAJ Patient Preference        Objective:   VITALS: Per patient if applicable, see vitals. GENERAL: Alert and in no acute distress. CARDIOPULMONARY: No increased WOB. Speaking in clear sentences.  PSYCH: Pleasant and cooperative. Speech  normal rate and rhythm. Affect is appropriate. Insight and judgement are appropriate. Attention is focused, linear, and appropriate.  NEURO: Oriented as arrived to appointment on time with no prompting.   Attestation Statements:   This was prepared with the assistance of Engineer, civil (consulting).  Occasional wrong-word or sound-a-like substitutions may have occurred due to the inherent limitations of voice recognition   Corinna Capra, DO

## 2023-05-29 ENCOUNTER — Other Ambulatory Visit: Payer: Self-pay | Admitting: Internal Medicine

## 2023-06-07 ENCOUNTER — Ambulatory Visit: Payer: 59 | Admitting: Adult Health

## 2023-06-07 ENCOUNTER — Encounter: Payer: Self-pay | Admitting: Adult Health

## 2023-06-07 VITALS — BP 122/78 | HR 89 | Temp 98.0°F | Ht 69.0 in | Wt 339.0 lb

## 2023-06-07 DIAGNOSIS — F5101 Primary insomnia: Secondary | ICD-10-CM | POA: Diagnosis not present

## 2023-06-07 DIAGNOSIS — F419 Anxiety disorder, unspecified: Secondary | ICD-10-CM | POA: Diagnosis not present

## 2023-06-07 MED ORDER — TRAZODONE HCL 50 MG PO TABS: 50.0000 mg | ORAL_TABLET | Freq: Every evening | ORAL | 0 refills | Status: DC | PRN

## 2023-06-07 NOTE — Patient Instructions (Signed)
 I am going to send in some trazodone to help you sleep. Take 1-2 tabs nightly.

## 2023-06-07 NOTE — Progress Notes (Signed)
 Subjective:    Patient ID: Sydney Bradley, female    DOB: 11-Feb-1981, 43 y.o.   MRN: [id number]  HPI 43 year old female who  has a past medical history of Anxiety, Arthritis, Asthma, Back pain, Constipation, Crohn's disease of ileum (HCC), Fatty liver, GERD (gastroesophageal reflux disease), High blood pressure, Hypertension, IBS (irritable bowel syndrome), Joint pain, Morbid obesity with BMI of 45.0-49.9, adult (HCC), Recurrent upper respiratory infection (URI), Sacral fracture (HCC), Urticaria, Vitamin B 12 deficiency, and Vitamin D deficiency.  She was seen two weeks ago   " She had been doing well with her anxiety and depression with Wellbutrin 150 mg ER daily until recently. She has had a lot of work stress over the last few months. She reports having a client make threats to her, her husband and children. These threats have increased her anxiety and she is having overwhelming thoughts that do not allow her to sleep well. She has trouble turning off her mind and has been feeling on edge most of the time for her safety and her families safety."  At this time we increased her Wellbutrin to 300 mg daily.   Today she reports that she is feeling better and is getting back to herself. She is not as paranoid or feeling panicked. She is taking Xanax PRN but rarely does she it during the day. She does have trouble sleeping ( has had insomnia in the past). Having trouble falling asleep.    Review of Systems See HPI   Past Medical History:  Diagnosis Date   Anxiety    Arthritis    L5 Back - no meds - otc prn   Asthma    Back pain    Constipation    Crohn's disease of ileum (HCC)    Fatty liver    GERD (gastroesophageal reflux disease)    High blood pressure    Hypertension    no meds x 3 yrs   IBS (irritable bowel syndrome)    Joint pain    Morbid obesity with BMI of 45.0-49.9, adult (HCC)    Recurrent upper respiratory infection (URI)    Sacral fracture (HCC)    Urticaria    Vitamin B  12 deficiency    Vitamin D deficiency     Social History   Socioeconomic History   Marital status: Married    Spouse name: Not on file   Number of children: 1   Years of education: 18   Highest education level: Master's degree (e.g., MA, MS, MEng, MEd, MSW, MBA)  Occupational History   Occupation: Child psychotherapist    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, foster care   Married    One biologic son Marlene Bast) born 2016 and a stepson   One caffeinated beverage daily   Social Drivers of Health   Financial Resource Strain: Low Risk  (10/07/2021)   Overall Financial Resource Strain (CARDIA)    Difficulty of Paying Living Expenses: Not very hard  Food Insecurity: No Food Insecurity (10/07/2021)   Hunger Vital Sign    Worried About Running Out of Food in the Last Year: Never true    Ran Out of Food in the Last Year: Never true  Transportation Needs: No Transportation Needs (10/07/2021)   PRAPARE - Administrator, Civil Service (Medical): No    Lack of Transportation (Non-Medical): No  Physical Activity: Unknown (10/07/2021)   Exercise Vital Sign    Days of Exercise per Week: 0 days    Minutes of Exercise per Session: Not on file  Stress: Stress Concern Present (10/07/2021)   [employer] of Occupational Health - Occupational Stress Questionnaire    Feeling of Stress : To some extent  Social Connections: Unknown (02/12/2023)   Received from [facility]   Social Network    Social Network: Not on file  Intimate Partner Violence: Not At  Risk (02/12/2023)   Received from [facility]   HITS    Over the last 12 months how often did your partner physically hurt you?: Never    Over the last 12 months how often did your partner insult you or talk down to you?: Never    Over the last 12 months how often did your partner threaten you with physical harm?: Never    Over the last 12 months how often did your partner scream or curse at you?: Never    Past Surgical History:  Procedure Laterality Date   ADENOIDECTOMY     COLONOSCOPY     DILATION AND EVACUATION  02/25/2012   Procedure: DILATATION AND EVACUATION;  Surgeon: Loney Laurence, MD;  Location: [facility];  Service: Gynecology;  Laterality: N/A;   DILATION AND EVACUATION N/A 03/01/2013   Procedure: DILATATION AND EVACUATION;  Surgeon: Levi Aland, MD;  Location: [facility];  Service: Gynecology;  Laterality: N/A;   TONSILLECTOMY     TONSILLECTOMY AND ADENOIDECTOMY  2007    Family History  Problem Relation Age of Onset   Depression Mother    Obesity Mother    Colon cancer Father 56       mets to liver and bone died at 14   Anxiety disorder Father    Bipolar disorder Father    Drug abuse Father    Obesity Father    Angioedema Sister    COPD Maternal Grandmother    Dementia Maternal Grandmother    Alzheimer's disease Maternal Grandfather    Cancer Paternal Grandmother        blood cancer?   Stroke Paternal Grandmother    Eczema Son    Asthma Son    Allergic rhinitis Son     Allergies  Allergen Reactions   Latex Hives    Breathing problems   Other Anaphylaxis and Itching    Nuts Tree nuts   Peanut-Containing Drug Products Anaphylaxis    Allergy to all nuts    Shellfish Allergy Anaphylaxis   Ciprofibrate Hives   Penicillins Hives    Has patient had a PCN reaction causing immediate rash, facial/tongue/throat swelling, SOB or lightheadedness with hypotension: Yes Has patient had a PCN reaction causing severe rash involving mucus membranes or skin  necrosis: No Has patient had a PCN reaction that required hospitalization No Has patient had a PCN reaction occurring within the last 10 years: No If all of the above answers are "NO", then may proceed with Cephalosporin use.    Chocolate Itching   Ciprofloxacin Hives   Lamisil [Terbinafine] Hives  Wheat Itching    Current Outpatient Medications on File Prior to Visit  Medication Sig Dispense Refill   albuterol (PROVENTIL) (2.5 MG/3ML) 0.083% nebulizer solution Take 3 mLs (2.5 mg total) by nebulization every 4 (four) hours as needed for wheezing or shortness of breath. 75 mL 0   albuterol (VENTOLIN HFA) 108 (90 Base) MCG/ACT inhaler Inhale 2 puffs into the lungs every 4 (four) hours as needed for wheezing or shortness of breath. 18 g 1   ALPRAZolam (XANAX) 0.25 MG tablet Take 1 tablet (0.25 mg total) by mouth 3 (three) times daily as needed for anxiety. 90 tablet 0   buPROPion (WELLBUTRIN XL) 300 MG 24 hr tablet Take 1 tablet (300 mg total) by mouth daily. 90 tablet 0   cetirizine (ZYRTEC) 10 MG tablet Take 10 mg by mouth daily.     Clindamycin-Benzoyl Per, Refr, gel Apply topically.     clotrimazole-betamethasone (LOTRISONE) cream Apply topically.     diphenhydrAMINE (BENADRYL) 25 MG tablet Take 25 mg by mouth as needed for allergies.      EPINEPHrine 0.3 mg/0.3 mL IJ SOAJ injection Inject 0.3 mg into the muscle as needed for anaphylaxis. 2 each 1   famotidine (PEPCID AC) 10 MG tablet Take 1 tablet (10 mg total) by mouth 2 (two) times daily. 60 tablet 3   fluticasone (FLONASE) 50 MCG/ACT nasal spray Place 2 sprays into both nostrils daily. 16 g 6   hydroquinone 4 % cream Apply 1 Dose topically daily.     levocetirizine (XYZAL) 5 MG tablet Take up to 2 tablets twice daily by mouth as needed for hives. 120 tablet 0   metFORMIN (GLUCOPHAGE) 500 MG tablet Take 1 tablet (500 mg total) by mouth daily with breakfast. 30 tablet 0   montelukast (SINGULAIR) 10 MG tablet TAKE ONE TABLET BY MOUTH  DAILY AT BEDTIME 30 tablet 0   Olopatadine HCl (PATADAY) 0.2 % SOLN 1 drop each eye daily as needed for itchy/watery eyes. 2.5 mL 3   omeprazole (PRILOSEC) 40 MG capsule take 1 tablet by mouth daily before breakfast 90 capsule 0   phentermine 37.5 MG capsule Take 1 capsule (37.5 mg total) by mouth every morning. 30 capsule 0   topiramate (TOPAMAX) 100 MG tablet Take 1 tablet (100 mg total) by mouth daily before supper. 30 tablet 0   Vitamin D, Ergocalciferol, (DRISDOL) 1.25 MG (50000 UNIT) CAPS capsule Take 1 capsule (50,000 Units total) by mouth every 7 (seven) days. 5 capsule 0   Current Facility-Administered Medications on File Prior to Visit  Medication Dose Route Frequency Provider Last Rate Last Admin   omalizumab Geoffry Paradise) prefilled syringe 300 mg  300 mg Subcutaneous Q28 days Marcelyn Bruins, MD   300 mg at 02/14/23 1629    BP 122/78   Pulse 89   Temp 98 F (36.7 C) (Oral)   Ht 5\' 9"  (1.753 m)   Wt (!) 339 lb (153.8 kg)   LMP 05/12/2023   SpO2 99%   BMI 50.06 kg/m       Objective:   Physical Exam Vitals and nursing note reviewed.  Constitutional:      Appearance: Normal appearance.  Cardiovascular:     Rate and Rhythm: Normal rate and regular rhythm.     Pulses: Normal pulses.     Heart sounds: Normal heart sounds.  Pulmonary:     Effort: Pulmonary effort is normal.     Breath sounds: Normal breath sounds.  Musculoskeletal:  General: Normal range of motion.  Skin:    General: Skin is warm and dry.  Neurological:     General: No focal deficit present.     Mental Status: She is alert and oriented to person, place, and time.  Psychiatric:        Mood and Affect: Mood normal.        Behavior: Behavior normal.        Thought Content: Thought content normal.        Assessment & Plan:  1. Anxiety (Primary) - Continue with Wellbutrin  - traZODone (DESYREL) 50 MG tablet; Take 1-2 tablets (50-100 mg total) by mouth at bedtime as needed for sleep.   Dispense: 60 tablet; Refill: 0  2. Primary insomnia - Will send in Trazodone to help her sleep  - traZODone (DESYREL) 50 MG tablet; Take 1-2 tablets (50-100 mg total) by mouth at bedtime as needed for sleep.  Dispense: 60 tablet; Refill: 0  Shirline Frees, NP

## 2023-06-09 ENCOUNTER — Ambulatory Visit: Payer: 59 | Admitting: Allergy

## 2023-06-13 ENCOUNTER — Other Ambulatory Visit: Payer: Self-pay | Admitting: Allergy

## 2023-06-15 ENCOUNTER — Ambulatory Visit: Payer: 59 | Admitting: Family

## 2023-06-15 DIAGNOSIS — J309 Allergic rhinitis, unspecified: Secondary | ICD-10-CM

## 2023-06-23 ENCOUNTER — Ambulatory Visit: Payer: 59 | Admitting: Bariatrics

## 2023-06-28 ENCOUNTER — Ambulatory Visit: Admitting: Bariatrics

## 2023-06-28 ENCOUNTER — Encounter: Payer: Self-pay | Admitting: Bariatrics

## 2023-06-28 VITALS — BP 127/86 | HR 87 | Temp 98.3°F | Ht 69.0 in | Wt 329.0 lb

## 2023-06-28 DIAGNOSIS — R632 Polyphagia: Secondary | ICD-10-CM

## 2023-06-28 DIAGNOSIS — F5089 Other specified eating disorder: Secondary | ICD-10-CM | POA: Diagnosis not present

## 2023-06-28 DIAGNOSIS — E66813 Obesity, class 3: Secondary | ICD-10-CM

## 2023-06-28 DIAGNOSIS — Z6841 Body Mass Index (BMI) 40.0 and over, adult: Secondary | ICD-10-CM | POA: Diagnosis not present

## 2023-06-28 MED ORDER — PHENTERMINE HCL 37.5 MG PO CAPS: 37.5000 mg | ORAL_CAPSULE | ORAL | 0 refills | Status: DC

## 2023-06-28 MED ORDER — TOPIRAMATE 100 MG PO TABS: 100.0000 mg | ORAL_TABLET | Freq: Every day | ORAL | 0 refills | Status: DC

## 2023-06-28 NOTE — Progress Notes (Unsigned)
 WEIGHT SUMMARY AND BIOMETRICS  Weight Lost Since Last Visit: 11lb  Weight Gained Since Last Visit: 0lb   Vitals Temp: 98.3 F (36.8 C) BP: 127/86 Pulse Rate: 87 SpO2: 95 %   Anthropometric Measurements Height: 5\' 9"  (1.753 m) Weight: (!) 329 lb (149.2 kg) BMI (Calculated): 48.56 Weight at Last Visit: 340lb Weight Lost Since Last Visit: 11lb Weight Gained Since Last Visit: 0lb Starting Weight: 343lb Total Weight Loss (lbs): 14 lb (6.35 kg)   Body Composition  Body Fat %: 55 % Fat Mass (lbs): 181.4 lbs Muscle Mass (lbs): 141 lbs Total Body Water (lbs): 107.2 lbs Visceral Fat Rating : 19   Other Clinical Data Fasting: No Labs: No Today's Visit #: 7 Starting Date: 02/08/23    OBESITY Sydney Bradley is here to discuss her progress with her obesity treatment plan along with follow-up of her obesity related diagnoses.    Nutrition Plan: the Category 3 plan - 75-80% adherence.  Current exercise: walking  Interim History:  She is down 11 lbs since her last visit.  Eating all of the food on the plan., Protein intake is as prescribed, Water intake is adequate., and Denies polyphagia   Pharmacotherapy: Sydney Bradley is on Phentermine 37.5 mg by mouth daily in am and Topamax 100 mg in the pm.  Adverse side effects: None, occasional dry mouth.  Hunger is moderately controlled.  Cravings are moderately controlled.  Assessment/Plan:   Polyphagia Sydney Bradley endorses excessive hunger.  Medication(s): Phentermine and Topamax.  Effects of medication:  moderately controlled. Cravings are moderately controlled.   Plan: Medication(s): Phentermine 37.5 mg by mouth daily in am Will increase water, protein and fiber to help assuage hunger.  Will minimize foods that have a high glucose index/load to minimize reactive hypoglycemia.   Eating disorder/emotional eating Sydney Bradley has had  issues with stress eating, emotional eating, and nighttime eating. Currently this is moderately controlled. Overall mood is stable. Denies suicidal/homicidal ideation. Medication(s): Topamax 100 mg in the evening.   Plan:  Motivational interviewing as well as evidence-based interventions for health behavior change were utilized today including the discussion of self monitoring techniques, problem-solving barriers and SMART goal setting techniques.  Discussed distractions to curb eating behaviors. Discussed activities to do with one's hands in the evening  Be sure to get adequate rest as lack of rest can trigger appetite.  Have plan in place for stressful events.  Consider other rewards besides food.     Morbid Obesity: Current BMI BMI (Calculated): 48.56   Pharmacotherapy Plan Continue and refill  Phentermine 37.5 mg by mouth daily in am, and Topamax 100 mg in the evening.   Sydney Bradley is currently in the action stage of change. As such, her goal is to continue with weight loss efforts.  She has agreed to the Category 3 plan.  Exercise goals: All adults should avoid inactivity. Some physical activity is  better than none, and adults who participate in any amount of physical activity gain some health benefits.  Behavioral modification strategies: increasing lean protein intake, decreasing simple carbohydrates , decrease eating out, meal planning , increase water intake, better snacking choices, planning for success, increasing vegetables, decrease snacking , and keep healthy foods in the home.  Sydney Bradley has agreed to follow-up with our clinic in 4 weeks.      Objective:   VITALS: Per patient if applicable, see vitals. GENERAL: Alert and in no acute distress. CARDIOPULMONARY: No increased WOB. Speaking in clear sentences.  PSYCH: Pleasant and cooperative. Speech normal rate and rhythm. Affect is appropriate. Insight and judgement are appropriate. Attention is focused, linear, and appropriate.   NEURO: Oriented as arrived to appointment on time with no prompting.   Attestation Statements:   This was prepared with the assistance of Engineer, civil (consulting).  Occasional wrong-word or sound-a-like substitutions may have occurred due to the inherent limitations of voice recognition   Sydney Capra, DO

## 2023-07-03 ENCOUNTER — Other Ambulatory Visit: Payer: Self-pay | Admitting: Adult Health

## 2023-07-03 DIAGNOSIS — F5101 Primary insomnia: Secondary | ICD-10-CM

## 2023-07-03 DIAGNOSIS — F419 Anxiety disorder, unspecified: Secondary | ICD-10-CM

## 2023-07-26 ENCOUNTER — Ambulatory Visit: Admitting: Bariatrics

## 2023-08-09 ENCOUNTER — Other Ambulatory Visit: Payer: Self-pay | Admitting: Adult Health

## 2023-08-09 ENCOUNTER — Other Ambulatory Visit: Payer: Self-pay | Admitting: Bariatrics

## 2023-08-09 DIAGNOSIS — F5101 Primary insomnia: Secondary | ICD-10-CM

## 2023-08-09 DIAGNOSIS — F419 Anxiety disorder, unspecified: Secondary | ICD-10-CM

## 2023-08-09 DIAGNOSIS — F5089 Other specified eating disorder: Secondary | ICD-10-CM

## 2023-08-12 ENCOUNTER — Ambulatory Visit: Admitting: Allergy

## 2023-08-12 ENCOUNTER — Encounter: Payer: Self-pay | Admitting: Allergy

## 2023-08-12 ENCOUNTER — Other Ambulatory Visit: Payer: Self-pay

## 2023-08-12 VITALS — BP 120/72 | HR 83 | Temp 98.3°F | Resp 18 | Ht 68.5 in | Wt 324.6 lb

## 2023-08-12 DIAGNOSIS — T7800XA Anaphylactic reaction due to unspecified food, initial encounter: Secondary | ICD-10-CM

## 2023-08-12 DIAGNOSIS — J4520 Mild intermittent asthma, uncomplicated: Secondary | ICD-10-CM

## 2023-08-12 DIAGNOSIS — T7800XD Anaphylactic reaction due to unspecified food, subsequent encounter: Secondary | ICD-10-CM

## 2023-08-12 DIAGNOSIS — L501 Idiopathic urticaria: Secondary | ICD-10-CM | POA: Diagnosis not present

## 2023-08-12 DIAGNOSIS — J3089 Other allergic rhinitis: Secondary | ICD-10-CM | POA: Diagnosis not present

## 2023-08-12 DIAGNOSIS — L299 Pruritus, unspecified: Secondary | ICD-10-CM

## 2023-08-12 DIAGNOSIS — H1013 Acute atopic conjunctivitis, bilateral: Secondary | ICD-10-CM

## 2023-08-12 MED ORDER — FAMOTIDINE 20 MG PO TABS: 20.0000 mg | ORAL_TABLET | Freq: Two times a day (BID) | ORAL | 5 refills | Status: DC

## 2023-08-12 MED ORDER — LEVOCETIRIZINE DIHYDROCHLORIDE 5 MG PO TABS: ORAL_TABLET | ORAL | 5 refills | Status: DC

## 2023-08-12 MED ORDER — MONTELUKAST SODIUM 10 MG PO TABS: 10.0000 mg | ORAL_TABLET | Freq: Every day | ORAL | 5 refills | Status: DC

## 2023-08-12 MED ORDER — EPINEPHRINE 0.3 MG/0.3ML IJ SOAJ: 0.3000 mg | INTRAMUSCULAR | 1 refills | Status: AC | PRN

## 2023-08-12 MED ORDER — ALBUTEROL SULFATE HFA 108 (90 BASE) MCG/ACT IN AERS: 2.0000 | INHALATION_SPRAY | RESPIRATORY_TRACT | 1 refills | Status: DC | PRN

## 2023-08-12 NOTE — Progress Notes (Signed)
 Follow-up Note  RE: Sydney Bradley MRN: [id number] DOB: 10-Aug-1980 Date of Office Visit: 08/12/2023   History of present illness: Sydney Bradley is a 43 y.o. female presenting today for follow-up of asthma, allergies, hives, food allergy.  She was last seen in the office on 12/09/22 by myself.  Discussed the use of AI scribe software for clinical note transcription with the patient, who gave verbal consent to proceed.  She experiences significant issues with environmental allergies, particularly during the current pollen season.  She does find this season to be worse than previous.  Itching occurs if pollen contacts her skin or hair, prompting her to wear hats and clothing that will cover the majority of her body. Her current allergy regimen includes Xyzal  (levocetirizine) twice daily, montelukast  at night, Pepcid  twice daily, and Benadryl  at night to prevent itching during sleep. She also uses Flonase  nasal spray when she absolutely needs to use it, although she dislikes using any type of nasal spray at home.  She will use an eyedrop like Pataday  as needed for itchy watery eyes.  She has a history of hives and previously tried Xolair  for 2 injections which were provided as samples, which was effective but financially burdensome due to high upfront costs and delayed insurance reimbursements. She is open to considering other possible hive therapy options.  She has not used her EpiPen  recently and continues to eat certain foods including nuts and seafood primarily.  She did experienced oral symptoms after consuming peanut M&M's, including a sore throat and tongue discomfort.  She has had negative testing to peanut in the past. She she has decided to just avoids peanuts and other potential allergens like shellfish, although she has tolerated fried whiting without issues.  Review of systems: 10pt ROS negative unless noted above in HPI   Past medical/social/surgical/family history have been reviewed and are  unchanged unless specifically indicated below.  No changes  Medication List: Current Outpatient Medications  Medication Sig Dispense Refill   albuterol  (PROVENTIL ) (2.5 MG/3ML) 0.083% nebulizer solution Take 3 mLs (2.5 mg total) by nebulization every 4 (four) hours as needed for wheezing or shortness of breath. 75 mL 0   albuterol  (VENTOLIN  HFA) 108 (90 Base) MCG/ACT inhaler Inhale 2 puffs into the lungs every 4 (four) hours as needed for wheezing or shortness of breath. 18 g 1   buPROPion  (WELLBUTRIN  XL) 300 MG 24 hr tablet Take 1 tablet (300 mg total) by mouth daily. 90 tablet 0   diphenhydrAMINE  (BENADRYL ) 25 MG tablet Take 25 mg by mouth as needed for allergies.      EPINEPHrine  0.3 mg/0.3 mL IJ SOAJ injection Inject 0.3 mg into the muscle as needed for anaphylaxis. 2 each 1   famotidine  (PEPCID  AC) 10 MG tablet Take 1 tablet (10 mg total) by mouth 2 (two) times daily. 60 tablet 3   fluticasone  (FLONASE ) 50 MCG/ACT nasal spray Place 2 sprays into both nostrils daily. 16 g 6   hydroquinone 4 % cream Apply 1 Dose topically daily.     levocetirizine (XYZAL ) 5 MG tablet Take up to 2 tablets twice daily by mouth as needed for hives. 120 tablet 0   metFORMIN  (GLUCOPHAGE ) 500 MG tablet Take 1 tablet (500 mg total) by mouth daily with breakfast. 30 tablet 0   montelukast  (SINGULAIR ) 10 MG tablet TAKE ONE TABLET BY MOUTH DAILY AT BEDTIME 30 tablet 0   Olopatadine  HCl (PATADAY ) 0.2 % SOLN 1 drop each eye daily as needed for itchy/watery eyes.  2.5 mL 3   omeprazole  (PRILOSEC) 40 MG capsule take 1 tablet by mouth daily before breakfast 90 capsule 0   phentermine  37.5 MG capsule Take 1 capsule (37.5 mg total) by mouth every morning. 30 capsule 0   topiramate  (TOPAMAX ) 100 MG tablet Take 1 tablet (100 mg total) by mouth daily before supper. 30 tablet 0   traZODone  (DESYREL ) 50 MG tablet Take one to two tablets (50-100 mg total) by mouth at bedtime as needed for sleep. 60 tablet 0   Vitamin D ,  Ergocalciferol , (DRISDOL ) 1.25 MG (50000 UNIT) CAPS capsule Take 1 capsule (50,000 Units total) by mouth every 7 (seven) days. 5 capsule 0   Current Facility-Administered Medications  Medication Dose Route Frequency Provider Last Rate Last Admin   omalizumab  (XOLAIR ) prefilled syringe 300 mg  300 mg Subcutaneous Q28 days Brian Campanile, MD   300 mg at 02/14/23 1629     Known medication allergies: Allergies  Allergen Reactions   Latex Hives    Breathing problems   Other Anaphylaxis and Itching    Nuts Tree nuts   Peanut-Containing Drug Products Anaphylaxis    Allergy to all nuts    Shellfish Allergy Anaphylaxis   Ciprofibrate Hives   Penicillins Hives    Has patient had a PCN reaction causing immediate rash, facial/tongue/throat swelling, SOB or lightheadedness with hypotension: Yes Has patient had a PCN reaction causing severe rash involving mucus membranes or skin necrosis: No Has patient had a PCN reaction that required hospitalization No Has patient had a PCN reaction occurring within the last 10 years: No If all of the above answers are "NO", then may proceed with Cephalosporin use.    Chocolate Itching   Ciprofloxacin Hives   Lamisil [Terbinafine] Hives   Wheat Itching     Physical examination: Blood pressure 120/72, pulse 83, temperature 98.3 F (36.8 C), temperature source Temporal, resp. rate 18, height 5' 8.5" (1.74 m), weight (!) 324 lb 9.6 oz (147.2 kg), SpO2 98%.  General: Alert, interactive, in no acute distress. HEENT: PERRLA, TMs pearly gray, turbinates mildly edematous without discharge, post-pharynx non erythematous. Neck: Supple without lymphadenopathy. Lungs: Clear to auscultation without wheezing, rhonchi or rales. {no increased work of breathing. CV: Normal S1, S2 without murmurs. Abdomen: Nondistended, nontender. Skin: Warm and dry, without lesions or rashes. Extremities:  No clubbing, cyanosis or edema. Neuro:   Grossly  intact.  Diagnositics/Labs: Spirometry: FEV1: 2.7L 92%, FVC: 3.24L 89%, ratio consistent with nonobstructive pattern  Assessment and plan: Hives and Itching, chronic - at this time etiology of hives and itching is spontaneous.  Hives can be caused by a variety of different triggers including illness/infection, foods, medications, stings, exercise, pressure, vibrations, extremes of temperature to name a few however majority of the time there is no identifiable trigger.   - continue Xyzal  5mg  1 tab twice day, Pepcid  10mg  1 tab twice a day and continue Singulair  10mg  daily  - Xolair  tried however unable to continue due to insurance coverage/cost  - Discussed today other options for hive control which now include Dupixent injections (discussed benefits, risks, mechanism of action) as well as cyclosporine oral therapy (discussed benefits, risks, mechanism of action).  Will have Tammy our coordinator for our injectable medication see if Dupixent would be different and insurance covers the Xolair  if so then this could be a good option for hive control.  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  - Eating white fish without issue thus can continue this in the diet. - Has noted oral symptoms with peanut ingestion thus continue to avoid.  There may be an oral allergy syndrome component with pollen with peanut ingestion. - have access to self-injectable epinephrine  (Epipen  or AuviQ) 0.3mg  at all times - follow emergency action plan in case of allergic reaction  Asthma - have access to albuterol  inhaler 2 puffs every 4-6 hours as needed for cough/wheeze/shortness of breath/chest tightness.  May  use 15-20 minutes prior to activity.   Monitor frequency of use.    Follow-up in 6 months or sooner if needed   I appreciate the opportunity to take part in Sydney Bradley's care. Please do not hesitate to contact me with questions.  Sincerely,   Catha Clink, MD Allergy/Immunology [facility]

## 2023-08-12 NOTE — Patient Instructions (Addendum)
 Hives and Itching, chronic - at this time etiology of hives and itching is spontaneous.  Hives can be caused by a variety of different triggers including illness/infection, foods, medications, stings, exercise, pressure, vibrations, extremes of temperature to name a few however majority of the time there is no identifiable trigger.   - continue Xyzal  5mg  1 tab twice day, Pepcid  10mg  1 tab twice a day and continue Singulair  10mg  daily  - Xolair  tried however unable to continue due to insurance coverage/cost  - Discussed today other options for hive control which now include Dupixent injections (discussed benefits, risks, mechanism of action) as well as cyclosporine oral therapy (discussed benefits, risks, mechanism of action).  Will have Tammy our coordinator for our injectable medication see if Dupixent would be different and insurance covers the Xolair  if so then this could be a good option for hive control.  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  - Eating white fish without issue thus can continue this in the diet. - Has noted oral symptoms with peanut ingestion thus continue to avoid.  There may be an oral allergy syndrome component with pollen with peanut ingestion. - have access to self-injectable epinephrine  (Epipen  or AuviQ) 0.3mg  at all times - follow emergency action plan in case of allergic reaction  Asthma - have access to albuterol  inhaler 2 puffs every 4-6 hours as needed for cough/wheeze/shortness of breath/chest tightness.  May use 15-20 minutes prior to activity.   Monitor frequency of use.    Follow-up in 6 months or sooner if needed

## 2023-08-19 ENCOUNTER — Telehealth: Payer: Self-pay | Admitting: Internal Medicine

## 2023-08-19 NOTE — Telephone Encounter (Signed)
 Patient advised. She reports she has held phentermine  for the past 5 days and will continue to do so. She may add a daily dose of Miralax until she is seen. Call her if there are any cancellations.

## 2023-08-19 NOTE — Telephone Encounter (Signed)
 Patient requesting to speak with a nurse in regards to chromes. Please advise.

## 2023-08-19 NOTE — Telephone Encounter (Signed)
 If she is taking phentermine  she should stop it.  That can cause bowel habit changes.  Otherwise we will need to wait to be seen.

## 2023-08-19 NOTE — Telephone Encounter (Signed)
 Patient contacted. Complains of constipation alternating with diarrhea "about a every 5 day cycle" for the past month and a half. She always feels bloated and gassy. She has tried to manage her symptoms with increase water intake, eating more vegetables and intentional weight loss. "Laxatives seem to make my symptoms worse."  She is concerned her symptoms are due to a flare of Crohn's.  She is scheduled for an evaluation with [facility] , [place] on 09/09/23. Patient asks if you have any recommendations prior to the appointment.

## 2023-08-20 ENCOUNTER — Other Ambulatory Visit: Payer: Self-pay | Admitting: Adult Health

## 2023-08-20 DIAGNOSIS — F419 Anxiety disorder, unspecified: Secondary | ICD-10-CM

## 2023-09-09 ENCOUNTER — Ambulatory Visit: Admitting: Physician Assistant

## 2023-09-09 ENCOUNTER — Other Ambulatory Visit

## 2023-09-09 ENCOUNTER — Encounter: Payer: Self-pay | Admitting: Physician Assistant

## 2023-09-09 VITALS — BP 126/84 | HR 91 | Ht 68.5 in | Wt 325.0 lb

## 2023-09-09 DIAGNOSIS — K50018 Crohn's disease of small intestine with other complication: Secondary | ICD-10-CM

## 2023-09-09 DIAGNOSIS — K5900 Constipation, unspecified: Secondary | ICD-10-CM

## 2023-09-09 DIAGNOSIS — K50919 Crohn's disease, unspecified, with unspecified complications: Secondary | ICD-10-CM

## 2023-09-09 LAB — C-REACTIVE PROTEIN: CRP: <1 mg/dL (ref 0.5–20.0)

## 2023-09-09 LAB — SEDIMENTATION RATE: Sed Rate: 32 mm/h — ABNORMAL HIGH (ref 0–20)

## 2023-09-09 NOTE — Patient Instructions (Addendum)
 _______________________________________________________  If your blood pressure at your visit was 140/90 or greater, please contact your primary care physician to follow up on this.  _______________________________________________________  If you are age 44 or older, your body mass index should be between 23-30. Your Body mass index is 48.7 kg/m. If this is out of the aforementioned range listed, please consider follow up with your Primary Care Provider.  If you are age 19 or younger, your body mass index should be between 19-25. Your Body mass index is 48.7 kg/m. If this is out of the aformentioned range listed, please consider follow up with your Primary Care Provider.   ________________________________________________________  The  GI providers would like to encourage you to use MYCHART to communicate with providers for non-urgent requests or questions.  Due to long hold times on the telephone, sending your provider a message by [employer] may be a faster and more efficient way to get a response.  Please allow 48 business hours for a response.  Please remember that this is for non-urgent requests.  _______________________________________________________  Your provider has requested that you go to the basement level for lab work before leaving today. Press "B" on the elevator. The lab is located at the first door on the left as you exit the elevator.  Reginal Capra PA-C recommends that you complete a bowel purge (to clean out your bowels). Please do the following: Purchase a bottle of Miralax over the counter as well as a box of 5 mg dulcolax tablets. Take 4 dulcolax tablets. Wait 1 hour. You will then drink 6-8 capfuls of Miralax mixed in an adequate amount of water/juice/gatorade (you may choose which of these liquids to drink) over the next 2-3 hours. You should expect results within 1 to 6 hours after completing the bowel purge.  Then do miralax 1 capful 2 times a day  You  have been scheduled for an appointment with Reginal Capra PA-C on 11-15-23 at 3pm . Please arrive 10 minutes early for your appointment.  It was a pleasure to see you today!  Thank you for trusting me with your gastrointestinal care!

## 2023-09-09 NOTE — Progress Notes (Signed)
 Chief Complaint: Crohn's disease with constipation  HPI:    Sydney Bradley is a 43 year old African-American female with past medical history as listed below including Crohn's disease of the ileum and IBS, known to Dr. Willy Harvest, who was referred to me by Alto Atta, NP for a complaint of Crohn's disease with constipation.    06/23/2021 colonoscopy due to family history of colon cancer with Crohn's disease of the ileum.  Colon looked normal but there were shallow ulcerations in terminal ileum.  Pathology showed focal active ileitis without changes of chronicity but Crohn's is possible.    08/14/2021 patient seen in clinic for Crohn's ileitis, though apparently Dr. Willy Harvest is not certain of that.  She was doing well from a symptomatic standpoint and treatment was not started.  Recommended a CT enterography for more information.  Also with CRP and sed rate.  Recommend she use MiraLAX as needed for constipation that she had with phentermine .    08/19/2023 patient called in with issues and was advised to hold her Phentermine .    Today, the patient tells me that initially she was diagnosed with Crohn's with diarrhea, she was on medication for this for a while but then it was stopped because her insurance no longer covered it.  In 2023 she switched management to just a diet and she has done fairly well until now.  She had always had diarrhea but over the past 4 months or so she has had a change to constipation and has not changed anything in her diet.  She really tries to eat fairly healthy.  She has been on Phentermine  off-and-on over the years, she most recently tried to stop this to see if it help with bowel movements but is still really has not.  She can go a week without a bowel movement at all even though she is increased fiber and water.  She feels full all the time.  She did take Dulcolax a couple of nights ago and that helped and she has been taking MiraLAX daily but still is not feeling satisfied with her  bowel movements.    Denies fever, chills or weight loss.  Past Medical History:  Diagnosis Date   Anxiety    Arthritis    L5 Back - no meds - otc prn   Asthma    Back pain    Constipation    Crohn's disease of ileum (HCC)    Fatty liver    GERD (gastroesophageal reflux disease)    High blood pressure    Hypertension    no meds x 3 yrs   IBS (irritable bowel syndrome)    Joint pain    Morbid obesity with BMI of 45.0-49.9, adult (HCC)    Recurrent upper respiratory infection (URI)    Sacral fracture (HCC)    Urticaria    Vitamin B 12 deficiency    Vitamin D  deficiency     Past Surgical History:  Procedure Laterality Date   ADENOIDECTOMY     COLONOSCOPY     DILATION AND EVACUATION  02/25/2012   Procedure: DILATATION AND EVACUATION;  Surgeon: Oddis Bench, MD;  Location: [facility];  Service: Gynecology;  Laterality: N/A;   DILATION AND EVACUATION N/A 03/01/2013   Procedure: DILATATION AND EVACUATION;  Surgeon: Hamp Levine, MD;  Location: [facility];  Service: Gynecology;  Laterality: N/A;   TONSILLECTOMY     TONSILLECTOMY AND ADENOIDECTOMY  2007    Current Outpatient Medications  Medication Sig Dispense Refill  albuterol  (PROVENTIL ) (2.5 MG/3ML) 0.083% nebulizer solution Take 3 mLs (2.5 mg total) by nebulization every 4 (four) hours as needed for wheezing or shortness of breath. 75 mL 0   albuterol  (VENTOLIN  HFA) 108 (90 Base) MCG/ACT inhaler Inhale 2 puffs into the lungs every 4 (four) hours as needed for wheezing or shortness of breath. 18 g 1   buPROPion  (WELLBUTRIN  XL) 300 MG 24 hr tablet Take 1 tablet (300 mg total) by mouth daily. 90 tablet 0   diphenhydrAMINE  (BENADRYL ) 25 MG tablet Take 25 mg by mouth as needed for allergies.      EPINEPHrine  0.3 mg/0.3 mL IJ SOAJ injection Inject 0.3 mg into the muscle as needed for anaphylaxis. 2 each 1   famotidine  (PEPCID ) 20 MG tablet Take 1 tablet (20 mg total) by mouth 2 (two) times daily. 60 tablet 5   fluticasone   (FLONASE ) 50 MCG/ACT nasal spray Place 2 sprays into both nostrils daily. 16 g 6   hydroquinone 4 % cream Apply 1 Dose topically daily.     levocetirizine (XYZAL ) 5 MG tablet Take up to 2 tablets twice daily by mouth as needed for hives. 120 tablet 5   metFORMIN  (GLUCOPHAGE ) 500 MG tablet Take 1 tablet (500 mg total) by mouth daily with breakfast. 30 tablet 0   montelukast  (SINGULAIR ) 10 MG tablet Take 1 tablet (10 mg total) by mouth at bedtime. 30 tablet 5   Olopatadine  HCl (PATADAY ) 0.2 % SOLN 1 drop each eye daily as needed for itchy/watery eyes. 2.5 mL 3   omeprazole  (PRILOSEC) 40 MG capsule take 1 tablet by mouth daily before breakfast 90 capsule 0   phentermine  37.5 MG capsule Take 1 capsule (37.5 mg total) by mouth every morning. 30 capsule 0   topiramate  (TOPAMAX ) 100 MG tablet Take 1 tablet (100 mg total) by mouth daily before supper. 30 tablet 0   traZODone  (DESYREL ) 50 MG tablet Take one to two tablets (50-100 mg total) by mouth at bedtime as needed for sleep. 60 tablet 0   Vitamin D , Ergocalciferol , (DRISDOL ) 1.25 MG (50000 UNIT) CAPS capsule Take 1 capsule (50,000 Units total) by mouth every 7 (seven) days. 5 capsule 0   Current Facility-Administered Medications  Medication Dose Route Frequency Provider Last Rate Last Admin   omalizumab  (XOLAIR ) prefilled syringe 300 mg  300 mg Subcutaneous Q28 days Brian Campanile, MD   300 mg at 02/14/23 1629    Allergies as of 09/09/2023 - Review Complete 09/09/2023  Allergen Reaction Noted   Latex Hives 02/24/2012   Other Anaphylaxis and Itching 02/24/2012   Peanut-containing drug products Anaphylaxis 11/04/2012   Shellfish allergy Anaphylaxis 02/24/2012   Ciprofibrate Hives 01/23/2019   Penicillins Hives 04/30/2011   Chocolate Itching 02/24/2012   Ciprofloxacin Hives 04/30/2011   Lamisil [terbinafine] Hives 11/04/2012   Wheat Itching 02/24/2012    Family History  Problem Relation Age of Onset   Depression Mother     Obesity Mother    Colon cancer Father 64       mets to liver and bone died at 36   Anxiety disorder Father    Bipolar disorder Father    Drug abuse Father    Obesity Father    Angioedema Sister    COPD Maternal Grandmother    Dementia Maternal Grandmother    Alzheimer's disease Maternal Grandfather    Cancer Paternal Grandmother        blood cancer?   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[place], foster care   Married    One biologic son Elwin Hammond) born 2016 and a stepson   One caffeinated beverage daily   Social Drivers of Health   Financial Resource Strain: Low Risk  (10/07/2021)   Overall Financial Resource Strain (CARDIA)    Difficulty of Paying Living Expenses: Not very hard  Food Insecurity: No Food Insecurity (10/07/2021)   Hunger Vital Sign    Worried About Running Out of Food in the Last Year: Never true    Ran Out of Food in the Last Year: Never true  Transportation Needs: No Transportation Needs (10/07/2021)   PRAPARE - Administrator, Civil Service (Medical): No    Lack of Transportation (Non-Medical): No  Physical Activity: Unknown (10/07/2021)   Exercise Vital  Sign    Days of Exercise per Week: 0 days    Minutes of Exercise per Session: Not on file  Stress: Stress Concern Present (10/07/2021)   [employer] of Occupational Health - Occupational Stress Questionnaire    Feeling of Stress : To some extent  Social Connections: Unknown (02/12/2023)   Received from [facility]   Social Network    Social Network: Not on file  Intimate Partner Violence: Not At Risk (02/12/2023)   Received from [facility]   HITS    Over the last 12 months how often did your partner physically hurt you?: Never    Over the last 12 months how often did your partner insult you or talk down to you?: Never    Over the last 12 months how often did your partner threaten you with physical harm?: Never    Over the last 12 months how often did your partner scream or curse at you?: Never    Review of Systems:    Constitutional: No weight loss, fever or chills Cardiovascular: No chest pain Respiratory: No SOB  Gastrointestinal: See HPI and otherwise negative   Physical Exam:  Vital signs: BP 126/84 (BP Location: Left Arm, Patient Position: Sitting, Cuff Size: Large)   Pulse 91   Ht 5' 8.5" (1.74 m)   Wt (!) 325 lb (147.4 kg)   BMI 48.70 kg/m    Constitutional:   Pleasant obese AA female appears to be in NAD, Well developed, Well nourished, alert and cooperative Respiratory: Respirations even and unlabored. Lungs clear to auscultation bilaterally.   No wheezes, crackles, or rhonchi.  Cardiovascular: Normal S1, S2. No MRG. Regular rate and rhythm. No peripheral edema, cyanosis or pallor.  Gastrointestinal:  Soft, nondistended, nontender. No rebound or  guarding. Normal bowel sounds. No appreciable masses or hepatomegaly. Rectal:  Not performed.  Psychiatric: Oriented to person, place and time. Demonstrates good judgement and reason without abnormal affect or behaviors.  RELEVANT LABS AND IMAGING: CBC    Component Value Date/Time   WBC 6.0 02/08/2023 0954   WBC  6.2 12/18/2021 0918   RBC 4.74 02/08/2023 0954   RBC 4.70 12/18/2021 0918   HGB 12.8 02/08/2023 0954   HCT 39.7 02/08/2023 0954   PLT 263 02/08/2023 0954   MCV 84 02/08/2023 0954   MCH 27.0 02/08/2023 0954   MCH 27.5 10/13/2014 0832   MCHC 32.2 02/08/2023 0954   MCHC 32.8 12/18/2021 0918   RDW 12.9 02/08/2023 0954   LYMPHSABS 2.5 02/08/2023 0954   MONOABS 0.5 12/18/2021 0918   EOSABS 0.1 02/08/2023 0954   BASOSABS 0.1 02/08/2023 0954    CMP     Component Value Date/Time   NA 142 09/23/2022 1115   K 4.2 09/23/2022 1115   CL 105 09/23/2022 1115   CO2 23 09/23/2022 1115   GLUCOSE 84 09/23/2022 1115   GLUCOSE 100 (H) 12/18/2021 0918   BUN 7 09/23/2022 1115   CREATININE 0.75 09/23/2022 1115   CALCIUM 9.2 09/23/2022 1115   PROT 6.9 09/23/2022 1115   ALBUMIN 3.9 09/23/2022 1115   AST 11 09/23/2022 1115   ALT 15 09/23/2022 1115   ALKPHOS 84 09/23/2022 1115   BILITOT 0.3 09/23/2022 1115   GFRNONAA >60 10/12/2014 1500   GFRAA >60 10/12/2014 1500    Assessment: 1.  Crohn's disease of the ileum: There has been some discussion about whether or not this is a true diagnosis, currently the patient is not managed on any maintenance medications and instead manages her symptoms with diet, has had a change to constipation as below 2.  Constipation: Can go a week without a bowel movement, this is regardless of increasing fiber and water in her diet, start MiraLAX daily which does not seem to help a whole lot; consider IBS-C +/- worsening Crohn/stricturing  Plan: 1.  Would increase MiraLAX to twice daily, she will for start by doing a MiraLAX bowel purge.  Given instructions on this.  Explained that she can take MiraLAX up to 4 times a day if necessary. 2.  Also ordered labs for her Crohn's including fecal calprotectin, ESR and CRP 3.  I do not feel like she needs an emergent colonoscopy but if symptoms are no better with the bowel regimen as above her labs show increase in inflammation and  may need to consider. 4.  Patient to follow in clinic with me in 2 to 3 months or sooner if necessary.  Reginal Capra, PA-C [facility] 09/09/2023, 2:37 PM  Cc: Nafziger, Cory, NP

## 2023-09-12 ENCOUNTER — Ambulatory Visit: Payer: Self-pay | Admitting: Physician Assistant

## 2023-09-15 ENCOUNTER — Other Ambulatory Visit: Payer: Self-pay | Admitting: *Deleted

## 2023-09-15 ENCOUNTER — Other Ambulatory Visit

## 2023-09-15 DIAGNOSIS — K50919 Crohn's disease, unspecified, with unspecified complications: Secondary | ICD-10-CM

## 2023-09-15 DIAGNOSIS — K5900 Constipation, unspecified: Secondary | ICD-10-CM

## 2023-09-17 ENCOUNTER — Other Ambulatory Visit: Payer: Self-pay | Admitting: Internal Medicine

## 2023-09-17 ENCOUNTER — Other Ambulatory Visit: Payer: Self-pay | Admitting: Adult Health

## 2023-09-17 DIAGNOSIS — F419 Anxiety disorder, unspecified: Secondary | ICD-10-CM

## 2023-09-17 DIAGNOSIS — F5101 Primary insomnia: Secondary | ICD-10-CM

## 2023-09-17 LAB — CALPROTECTIN, FECAL: Calprotectin, Fecal: 195 ug/g — ABNORMAL HIGH (ref 0–120)

## 2023-09-19 ENCOUNTER — Ambulatory Visit: Payer: Self-pay | Admitting: Physician Assistant

## 2023-09-26 ENCOUNTER — Ambulatory Visit: Admitting: Internal Medicine

## 2023-09-26 ENCOUNTER — Other Ambulatory Visit (INDEPENDENT_AMBULATORY_CARE_PROVIDER_SITE_OTHER)

## 2023-09-26 ENCOUNTER — Encounter: Payer: Self-pay | Admitting: Internal Medicine

## 2023-09-26 VITALS — BP 130/72 | HR 99 | Ht 69.0 in | Wt 331.0 lb

## 2023-09-26 DIAGNOSIS — K50018 Crohn's disease of small intestine with other complication: Secondary | ICD-10-CM | POA: Diagnosis not present

## 2023-09-26 DIAGNOSIS — K5900 Constipation, unspecified: Secondary | ICD-10-CM | POA: Diagnosis not present

## 2023-09-26 DIAGNOSIS — Z800 Family history of malignant neoplasm of digestive organs: Secondary | ICD-10-CM

## 2023-09-26 DIAGNOSIS — R194 Change in bowel habit: Secondary | ICD-10-CM

## 2023-09-26 LAB — COMPREHENSIVE METABOLIC PANEL WITH GFR
ALT: 25 U/L (ref 0–35)
AST: 20 U/L (ref 0–37)
Albumin: 3.8 g/dL (ref 3.5–5.2)
Alkaline Phosphatase: 59 U/L (ref 39–117)
BUN: 11 mg/dL (ref 6–23)
CO2: 23 meq/L (ref 19–32)
Calcium: 8.8 mg/dL (ref 8.4–10.5)
Chloride: 108 meq/L (ref 96–112)
Creatinine, Ser: 0.78 mg/dL (ref 0.40–1.20)
GFR: 92.42 mL/min (ref >60.00)
Glucose, Bld: 100 mg/dL — ABNORMAL HIGH (ref 70–99)
Potassium: 3.9 meq/L (ref 3.5–5.1)
Sodium: 138 meq/L (ref 135–145)
Total Bilirubin: 0.3 mg/dL (ref 0.2–1.2)
Total Protein: 7 g/dL (ref 6.0–8.3)

## 2023-09-26 LAB — CBC WITH DIFFERENTIAL/PLATELET
Basophils Absolute: 0.1 10*3/uL (ref 0.0–0.1)
Basophils Relative: 0.9 % (ref 0.0–3.0)
Eosinophils Absolute: 0.1 10*3/uL (ref 0.0–0.7)
Eosinophils Relative: 1.6 % (ref 0.0–5.0)
HCT: 39.5 % (ref 36.0–46.0)
Hemoglobin: 12.8 g/dL (ref 12.0–15.0)
Lymphocytes Relative: 40.9 % (ref 12.0–46.0)
Lymphs Abs: 2.3 10*3/uL (ref 0.7–4.0)
MCHC: 32.4 g/dL (ref 30.0–36.0)
MCV: 82.6 fl (ref 78.0–100.0)
Monocytes Absolute: 0.5 10*3/uL (ref 0.1–1.0)
Monocytes Relative: 8.2 % (ref 3.0–12.0)
Neutro Abs: 2.7 10*3/uL (ref 1.4–7.7)
Neutrophils Relative %: 48.4 % (ref 43.0–77.0)
Platelets: 249 10*3/uL (ref 150.0–400.0)
RBC: 4.78 Mil/uL (ref 3.87–5.11)
RDW: 14.5 % (ref 11.5–15.5)
WBC: 5.6 10*3/uL (ref 4.0–10.5)

## 2023-09-26 LAB — TSH: TSH: 3.56 m[IU]/L

## 2023-09-26 MED ORDER — NA SULFATE-K SULFATE-MG SULF 17.5-3.13-1.6 GM/177ML PO SOLN: 1.0000 | ORAL | 0 refills | Status: DC

## 2023-09-26 NOTE — Progress Notes (Signed)
 Sydney Bradley 43 y.o. 05-Oct-1980 [id number]  Assessment & Plan:   Encounter Diagnoses  Name Primary?   Change in bowel habits Yes   Constipation, unspecified constipation type    Crohn's disease of ileum with other complication (HCC)    Family history of colon cancer in father     Because of bowel habit changes not clear.  Given her history of Crohn's ileitis and elevated fecal calprotectin plus family history of colon cancer I think a colonoscopy is warranted.  It does sound like she has outlet dysfunction and I think despite normal functional rectal exam today we will need to consider pelvic floor physical therapy.  Await labs as below and colonoscopy findings.  The risks and benefits as well as alternatives of endoscopic procedure(s) have been discussed and reviewed. All questions answered. The patient agrees to proceed.    Orders Placed This Encounter  Procedures   CBC with Differential/Platelet   Comprehensive metabolic panel with GFR   TSH   Ambulatory referral to Gastroenterology        Subjective:  Patient consented to the use of artificial intelligence scribe Chief Complaint: Constipation, Crohn's disease  HPI 43 year old woman with a history of Crohn's ileitis and diarrhea problems and some constipation in the past.  She saw Reginal Capra, PA-C 09/09/2023 she was having constipation problems we had advised her to hold her phentermine , has been on no therapy since 2019(insurance medication cost issues).  She has been treated with Entocort and then Cimzia  starting in 2018.  She changed to constipation after a diarrhea predominance in the early part of 2025, stopping phentermine  did not seem to help.  Patient feels bloated and full.  Daily MiraLAX was not helping, Dulcolax can produce a bowel movement per May note. Fecal calprotectin 195 on September 15, 2022 Sed rate 32 09/09/2023 C-reactive protein less than 1 09/09/2023  She continues to have problems with constipation  and bloating. She feels bloated and full constantly, with sharp, non-debilitating abdominal pain. Her bowel regimen includes MiraLAX at night and sometimes in the morning, yet she only has bowel movements every couple of days, with significant gas and bloating by day's end. Dulcolax is avoided due to cramping. Bowel movements can take 45 minutes to an hour, impacting her daily activities. She maintains fluid and fiber intake, but oatmeal causes bloating. She has switched to Activia yogurt with protein granola for breakfast and drinks water throughout the day. No vomiting is present, but she sometimes feels nauseous.   Stopping phentermine  has not made a difference.    Wt Readings from Last 3 Encounters:  09/26/23 (!) 331 lb (150.1 kg)  09/09/23 (!) 325 lb (147.4 kg)  08/12/23 (!) 324 lb 9.6 oz (147.2 kg)     CT enterography abdomen and pelvis with contrast 09/07/2021 IMPRESSION: Mild mucosal hyperenhancement in the terminal ileum, compatible with mild active Crohn terminal ileitis. No additional sites of active Crohn disease. No evidence of bowel obstruction. No abscess.   Colonoscopy 06/23/2021   - Crohn's disease with ileitis. Biopsied. - The entire examined colon is normal on direct and retroflexion views.\  Surgical [P], small bowel, terminal ileum - TERMINAL ILEAL MUCOSA WITH HYPERPLASTIC LYMPHOID AGGREGATES (PEYER'S PATCHES), WITH FOCAL ACTIVE ILEITIS (HISTOLOGICALLY NON-SPECIFIC IN THE ABSENCE OF ARCHITECTURAL DISTORTION, PSEUDOPYLORIC METAPLASIA AND GRANULOMAS, BUT CONSISTENT WITH MILD/EARLY CHANGES OF CROHN DISEASE IN THE PROPER CLINICAL CONTEXT). - NEGATIVE FOR DYSPLASIA. - NEGATIVE FOR ADENOMATOUS CHANGE AND MALIGNANCY.   Allergies  Allergen Reactions   Latex  Hives    Breathing problems   Other Anaphylaxis and Itching    Nuts Tree nuts   Peanut-Containing Drug Products Anaphylaxis    Allergy to all nuts    Shellfish Allergy Anaphylaxis   Ciprofibrate Hives    Penicillins Hives    Has patient had a PCN reaction causing immediate rash, facial/tongue/throat swelling, SOB or lightheadedness with hypotension: Yes Has patient had a PCN reaction causing severe rash involving mucus membranes or skin necrosis: No Has patient had a PCN reaction that required hospitalization No Has patient had a PCN reaction occurring within the last 10 years: No If all of the above answers are NO, then may proceed with Cephalosporin use.    Chocolate Itching   Ciprofloxacin Hives   Lamisil [Terbinafine] Hives   Wheat Itching    Current Outpatient Medications:     albuterol  (PROVENTIL ) (2.5 MG/3ML) 0.083% nebulizer solution, Take 3 mLs (2.5 mg total) by nebulization every 4 (four) hours as needed for wheezing or shortness of breath., Disp: 75 mL, Rfl: 0   albuterol  (VENTOLIN  HFA) 108 (90 Base) MCG/ACT inhaler, Inhale 2 puffs into the lungs every 4 (four) hours as needed for wheezing or shortness of breath., Disp: 18 g, Rfl: 1   buPROPion  (WELLBUTRIN  XL) 300 MG 24 hr tablet, Take 1 tablet (300 mg total) by mouth daily., Disp: 90 tablet, Rfl: 0   diphenhydrAMINE  (BENADRYL ) 25 MG tablet, Take 25 mg by mouth as needed for allergies. , Disp: , Rfl:    EPINEPHrine  0.3 mg/0.3 mL IJ SOAJ injection, Inject 0.3 mg into the muscle as needed for anaphylaxis., Disp: 2 each, Rfl: 1   famotidine  (PEPCID ) 20 MG tablet, Take 1 tablet (20 mg total) by mouth 2 (two) times daily., Disp: 60 tablet, Rfl: 5   fluticasone  (FLONASE ) 50 MCG/ACT nasal spray, Place 2 sprays into both nostrils daily., Disp: 16 g, Rfl: 6   hydroquinone 4 % cream, Apply 1 Dose topically daily., Disp: , Rfl:    levocetirizine (XYZAL ) 5 MG tablet, Take up to 2 tablets twice daily by mouth as needed for hives., Disp: 120 tablet, Rfl: 5   metFORMIN  (GLUCOPHAGE ) 500 MG tablet, Take 1 tablet (500 mg total) by mouth daily with breakfast., Disp: 30 tablet, Rfl: 0   montelukast  (SINGULAIR ) 10 MG tablet, Take 1 tablet (10 mg  total) by mouth at bedtime., Disp: 30 tablet, Rfl: 5   Olopatadine  HCl (PATADAY ) 0.2 % SOLN, 1 drop each eye daily as needed for itchy/watery eyes., Disp: 2.5 mL, Rfl: 3   omeprazole  (PRILOSEC) 40 MG capsule, TAKE ONE CAPSULE BY MOUTH ONCE A DAY BEFORE BREAKFAST, Disp: 90 capsule, Rfl: 3   phentermine  37.5 MG capsule, Take 1 capsule (37.5 mg total) by mouth every morning. (Patient not taking: Reported on 09/09/2023), Disp: 30 capsule, Rfl: 0   topiramate  (TOPAMAX ) 100 MG tablet, Take 1 tablet (100 mg total) by mouth daily before supper., Disp: 30 tablet, Rfl: 0   traZODone  (DESYREL ) 50 MG tablet, TAKE ONE OR TWO TABLETS BY MOUTH AT BEDTIME AS NEEDED FOR SLEEP, Disp: 60 tablet, Rfl: 6   Vitamin D , Ergocalciferol , (DRISDOL ) 1.25 MG (50000 UNIT) CAPS capsule, Take 1 capsule (50,000 Units total) by mouth every 7 (seven) days., Disp: 5 capsule, Rfl: 0  Current Facility-Administered Medications:    omalizumab  (XOLAIR ) prefilled syringe 300 mg, 300 mg, Subcutaneous, Q28 days, Brian Campanile, MD, 300 mg at 02/14/23 1629   Past Medical History:  Diagnosis Date   Anxiety  Arthritis    L5 Back - no meds - otc prn   Asthma    Back pain    Constipation    Crohn's disease of ileum (HCC)    Fatty liver    GERD (gastroesophageal reflux disease)    High blood pressure    Hypertension    no meds x 3 yrs   IBS (irritable bowel syndrome)    Joint pain    Morbid obesity with BMI of 45.0-49.9, adult (HCC)    Recurrent upper respiratory infection (URI)    Sacral fracture (HCC)    Urticaria    Vitamin B 12 deficiency    Vitamin D  deficiency    Past Surgical History:  Procedure Laterality Date   ADENOIDECTOMY     COLONOSCOPY     DILATION AND EVACUATION  02/25/2012   Procedure: DILATATION AND EVACUATION;  Surgeon: Oddis Bench, MD;  Location: [facility];  Service: Gynecology;  Laterality: N/A;   DILATION AND EVACUATION N/A 03/01/2013   Procedure: DILATATION AND EVACUATION;  Surgeon:  Hamp Levine, MD;  Location: [facility];  Service: Gynecology;  Laterality: N/A;   TONSILLECTOMY     TONSILLECTOMY AND ADENOIDECTOMY  2007   Social History   Social History Narrative   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[place], foster care   Married    One biologic son Elwin Hammond) born 2016 and a stepson   One caffeinated beverage daily   family history includes Allergic rhinitis in her son; Alzheimer's disease in her maternal grandfather; Angioedema in her sister; Anxiety disorder in her father; Asthma in her son; Bipolar disorder in her father; COPD in her maternal grandmother; Cancer in her paternal grandmother; Colon cancer (age of onset: 27) in her father; Dementia in her maternal grandmother; Depression in her mother; Drug abuse in her father; Eczema in her son; Obesity in her father and mother; Stroke in her paternal grandmother.   Review of Systems As per HPI  Objective:   Physical Exam @BP  130/72 Comment: large cuff  Pulse 99   Ht 5' 9 (1.753 m)   Wt (!) 331 lb (150.1 kg)   BMI 48.88 kg/m @  General:  NAD Eyes:   anicteric Lungs:  clear Heart::  S1S2 no rubs, murmurs or gallops Abdomen:  Obese, soft and nontender, BS+ Ext:   no edema, cyanosis or clubbing  Patti [place], CMA present.  Anoderm inspection revealed no abnormalities Anal wink was + Digital exam revealed normal resting tone and voluntary squeeze. No mass or rectocele present. Simulated defecation with valsalva revealed appropriate abdominal contraction and descent.       Data Reviewed:  See HPI

## 2023-09-26 NOTE — Patient Instructions (Addendum)
 You have been scheduled for a colonoscopy. Please follow written instructions given to you at your visit today.   If you use inhalers (even only as needed), please bring them with you on the day of your procedure.  DO NOT TAKE 7 DAYS PRIOR TO TEST- Trulicity (dulaglutide) Ozempic, Wegovy  (semaglutide ) Mounjaro  (tirzepatide ) Bydureon Bcise (exanatide extended release)  DO NOT TAKE 1 DAY PRIOR TO YOUR TEST Rybelsus (semaglutide ) Adlyxin (lixisenatide) Victoza (liraglutide) Byetta (exanatide) ___________________________________________________________________________ Your provider has requested that you go to the basement level for lab work before leaving today. Press B on the elevator. The lab is located at the first door on the left as you exit the elevator.  Due to recent changes in healthcare laws, you may see the results of your imaging and laboratory studies on MyChart before your provider has had a chance to review them.  We understand that in some cases there may be results that are confusing or concerning to you. Not all laboratory results come back in the same time frame and the provider may be waiting for multiple results in order to interpret others.  Please give us  48 hours in order for your provider to thoroughly review all the results before contacting the office for clarification of your results.    _______________________________________________________  If your blood pressure at your visit was 140/90 or greater, please contact your primary care physician to follow up on this.  _______________________________________________________  If you are age 26 or older, your body mass index should be between 23-30. Your Body mass index is 48.88 kg/m. If this is out of the aforementioned range listed, please consider follow up with your Primary Care Provider.  If you are age 63 or younger, your body mass index should be between 19-25. Your Body mass index is 48.88 kg/m. If this  is out of the aformentioned range listed, please consider follow up with your Primary Care Provider.   ________________________________________________________  The [place] providers would like to encourage you to use MYCHART to communicate with providers for non-urgent requests or questions.  Due to long hold times on the telephone, sending your provider a message by [facility] may be a faster and more efficient way to get a response.  Please allow 48 business hours for a response.  Please remember that this is for non-urgent requests.  _______________________________________________________  I appreciate the opportunity to care for you. Loy Ruff, MD, [facility]

## 2023-09-27 ENCOUNTER — Ambulatory Visit: Payer: Self-pay | Admitting: Internal Medicine

## 2023-10-03 ENCOUNTER — Encounter (INDEPENDENT_AMBULATORY_CARE_PROVIDER_SITE_OTHER): Payer: Self-pay | Admitting: Adult Health

## 2023-10-03 ENCOUNTER — Ambulatory Visit (INDEPENDENT_AMBULATORY_CARE_PROVIDER_SITE_OTHER): Admitting: Adult Health

## 2023-10-03 VITALS — BP 124/78 | HR 87 | Temp 98.1°F | Ht 69.0 in | Wt 327.0 lb

## 2023-10-03 DIAGNOSIS — Z6841 Body Mass Index (BMI) 40.0 and over, adult: Secondary | ICD-10-CM

## 2023-10-03 DIAGNOSIS — E66813 Obesity, class 3: Secondary | ICD-10-CM

## 2023-10-03 DIAGNOSIS — R194 Change in bowel habit: Secondary | ICD-10-CM | POA: Diagnosis not present

## 2023-10-03 DIAGNOSIS — F5089 Other specified eating disorder: Secondary | ICD-10-CM

## 2023-10-03 DIAGNOSIS — R7303 Prediabetes: Secondary | ICD-10-CM

## 2023-10-03 DIAGNOSIS — E559 Vitamin D deficiency, unspecified: Secondary | ICD-10-CM

## 2023-10-03 DIAGNOSIS — R632 Polyphagia: Secondary | ICD-10-CM

## 2023-10-03 DIAGNOSIS — Z0000 Encounter for general adult medical examination without abnormal findings: Secondary | ICD-10-CM

## 2023-10-03 MED ORDER — TOPIRAMATE 100 MG PO TABS: 100.0000 mg | ORAL_TABLET | Freq: Every day | ORAL | 0 refills | Status: DC

## 2023-10-03 MED ORDER — METFORMIN HCL 500 MG PO TABS: 500.0000 mg | ORAL_TABLET | Freq: Every day | ORAL | 0 refills | Status: DC

## 2023-10-03 MED ORDER — VITAMIN D (ERGOCALCIFEROL) 1.25 MG (50000 UNIT) PO CAPS: 50000.0000 [IU] | ORAL_CAPSULE | ORAL | 0 refills | Status: DC

## 2023-10-03 NOTE — Progress Notes (Unsigned)
 WEIGHT SUMMARY AND BIOMETRICS  Vitals Temp: 98.1 F (36.7 C) BP: 124/78 Pulse Rate: 87 SpO2: 100 %   Anthropometric Measurements Height: 5' 9 (1.753 m) Weight: (!) 327 lb (148.3 kg) BMI (Calculated): 48.27 Weight at Last Visit: 329 lb Weight Lost Since Last Visit: 2 lb Weight Gained Since Last Visit: 0 Starting Weight: 343 lb Total Weight Loss (lbs): 16 lb (7.258 kg)   Body Composition  Body Fat %: 54.6 % Fat Mass (lbs): 178.8 lbs Muscle Mass (lbs): 141.2 lbs Total Body Water (lbs): 108.6 lbs Visceral Fat Rating : 19   Other Clinical Data Fasting: no Labs: no Today's Visit #: 8 Starting Date: 02/08/23    Chief Complaint:   OBESITY Sydney Bradley is here to discuss her progress with her obesity treatment plan.  She is on the the Category 3 Plan and states she is following her eating plan approximately 0 % of the time.  She states she is exercising Walking/Pool 30/120 minutes 2/1 times per week.  Interim History:  06/28/2023 last OV at [facility] She has been off Metformin , Ergocalciferol , Topamax , and Phentermine  for weeks- lost to f/u  Sydney Bradley has been parking further from buildings, taking stairs at work, season pass to [employer]- lots of steps  [employer] in college in 220s Current weight 327 lbs  Reviewed Bioimpedance Results with pt: Muscle Mass: +0.2 lb Adipose Mass: +2.6 lbs  Subjective:   1. Change in bowel habits Her father passed away from Colon Ca at age 16 (Nov 2022) She has hx of Crohn's with diarrhea Last several months she has experienced constipation. She stopped Phetermine 37.5 mg late April She has continued Topamax  100mg  in the evening- however been out for several weeks due to being lost to f/u at [facility]  2. Prediabetes She has been off daily Metformin  500mg  for weeks, due to being lost to f/u at [facility] She endorses increased cravings and subsequent snacking the last month.  3. Vitamin D  deficiency She has been off weekly  Ergocalciferol  for weeks, due to being lost to f/u at [facility]  4. Polyphagia She endorses increased cravings and subsequent snacking the last month. She stopped Phetermine 37.5 mg late April She has continued Topamax  100mg  in the evening- however been out for several weeks due to being lost to f/u at [facility] She denies hx of nephrolithiasis or seizure d/o  Assessment/Plan:   1. Change in bowel habits F/u with GI as directed  2. Prediabetes (Primary) Restart    metFORMIN  (GLUCOPHAGE ) 500 MG tablet Take 1 tablet (500 mg total) by mouth daily with breakfast. Dispense: 30 tablet, Refills: 0 ordered   3. Vitamin D  deficiency Restart Vitamin D , Ergocalciferol , (DRISDOL ) 1.25 MG (50000 UNIT) CAPS capsule Take 1 capsule (50,000 Units total) by mouth every 7 (seven) days. Dispense: 5 capsule, Refills: 0 ordered   4. Polyphagia Remain off Phetermine Restart topiramate  (TOPAMAX ) 100 MG tablet Take 1 tablet (100 mg total) by mouth daily before supper. Dispense: 30 tablet, Refills: 0 ordered   5. Class 3 severe obesity due to excess calories with serious comorbidity and body mass index (BMI) of 45.0 to 49.9 in adult, CURRENT BMI 48.3  Sydney Bradley is not currently in the action stage of change. As such, her goal is to get back to weightloss efforts . She has agreed to the Category 3 Plan.   Exercise goals: All adults should avoid inactivity. Some physical activity is better than none, and adults who participate in any amount  of physical activity gain some health benefits. Adults should also include muscle-strengthening activities that involve all major muscle groups on 2 or more days a week.  Behavioral modification strategies: increasing lean protein intake, decreasing simple carbohydrates, increasing vegetables, increasing water intake, decreasing eating out, meal planning and cooking strategies, keeping healthy foods in the home, ways to avoid boredom eating, ways to avoid night time snacking, better snacking  choices, emotional eating strategies, and planning for success.  Charese has agreed to follow-up with our clinic in 4 weeks. She was informed of the importance of frequent follow-up visits to maximize her success with intensive lifestyle modifications for her multiple health conditions.   Check Fasting Labs at next OV  Objective:   Blood pressure 124/78, pulse 87, temperature 98.1 F (36.7 C), height 5' 9 (1.753 m), weight (!) 327 lb (148.3 kg), SpO2 100%. Body mass index is 48.29 kg/m.  General: Cooperative, alert, well developed, in no acute distress. HEENT: Conjunctivae and lids unremarkable. Cardiovascular: Regular rhythm.  Lungs: Normal work of breathing. Neurologic: No focal deficits.   Lab Results  Component Value Date   CREATININE 0.78 09/26/2023   BUN 11 09/26/2023   NA 138 09/26/2023   K 3.9 09/26/2023   CL 108 09/26/2023   CO2 23 09/26/2023   Lab Results  Component Value Date   ALT 25 09/26/2023   AST 20 09/26/2023   ALKPHOS 59 09/26/2023   BILITOT 0.3 09/26/2023   Lab Results  Component Value Date   HGBA1C 5.7 (H) 02/08/2023   HGBA1C 5.6 08/14/2019   HGBA1C 5.8 11/11/2017   Lab Results  Component Value Date   INSULIN  12.3 02/08/2023   Lab Results  Component Value Date   TSH 3.56 09/26/2023   Lab Results  Component Value Date   CHOL 151 02/08/2023   HDL 44 02/08/2023   LDLCALC 90 02/08/2023   TRIG 89 02/08/2023   CHOLHDL 3 08/14/2019   Lab Results  Component Value Date   VD25OH 21.1 (L) 02/08/2023   VD25OH 21.48 (L) 12/18/2021   Lab Results  Component Value Date   WBC 5.6 09/26/2023   HGB 12.8 09/26/2023   HCT 39.5 09/26/2023   MCV 82.6 09/26/2023   PLT 249.0 09/26/2023   Lab Results  Component Value Date   IRON 74 12/18/2021   TIBC 289.8 12/18/2021   FERRITIN 77.6 12/18/2021   Attestation Statements:   Reviewed by clinician on day of visit: allergies, medications, problem list, medical history, surgical history, family history,  social history, and previous encounter notes.  I have reviewed the above documentation for accuracy and completeness, and I agree with the above. -  Issabela Lesko d. Zavia Pullen, NP-C

## 2023-10-07 ENCOUNTER — Encounter: Payer: Self-pay | Admitting: Internal Medicine

## 2023-10-17 ENCOUNTER — Ambulatory Visit: Admitting: Internal Medicine

## 2023-10-17 ENCOUNTER — Encounter: Payer: Self-pay | Admitting: Internal Medicine

## 2023-10-17 VITALS — BP 129/87 | HR 65 | Temp 98.4°F | Resp 20 | Ht 69.0 in | Wt 331.0 lb

## 2023-10-17 DIAGNOSIS — K6389 Other specified diseases of intestine: Secondary | ICD-10-CM | POA: Diagnosis not present

## 2023-10-17 DIAGNOSIS — K635 Polyp of colon: Secondary | ICD-10-CM

## 2023-10-17 DIAGNOSIS — K50018 Crohn's disease of small intestine with other complication: Secondary | ICD-10-CM

## 2023-10-17 DIAGNOSIS — R194 Change in bowel habit: Secondary | ICD-10-CM

## 2023-10-17 DIAGNOSIS — Z800 Family history of malignant neoplasm of digestive organs: Secondary | ICD-10-CM

## 2023-10-17 DIAGNOSIS — D123 Benign neoplasm of transverse colon: Secondary | ICD-10-CM

## 2023-10-17 MED ORDER — SODIUM CHLORIDE 0.9 % IV SOLN: 500.0000 mL | Freq: Once | INTRAVENOUS | Status: DC

## 2023-10-17 NOTE — Progress Notes (Signed)
 Report given to PACU, vss

## 2023-10-17 NOTE — Patient Instructions (Addendum)
 There is slight inflammation in the small intestine as before.  There was a tiny colon polyp removed.  I do not think the Crohn's or polyp are responsible fr constipation.  Based upon your symptom history I am recommending you try pelvic floor physical therapy and will make a referral.  I will let you know pathology results and plans also.  I appreciate the opportunity to care for you. Sydney CHARLENA Commander, MD, FACG  YOU HAD AN ENDOSCOPIC PROCEDURE TODAY AT THE [facility]:   Refer to the procedure report that was given to you for any specific questions about what was found during the examination.  If the procedure report does not answer your questions, please call your gastroenterologist to clarify.  If you requested that your care partner not be given the details of your procedure findings, then the procedure report has been included in a sealed envelope for you to review at your convenience later.  YOU SHOULD EXPECT: Some feelings of bloating in the abdomen. Passage of more gas than usual.  Walking can help get rid of the air that was put into your GI tract during the procedure and reduce the bloating. If you had a lower endoscopy (such as a colonoscopy or flexible sigmoidoscopy) you may notice spotting of blood in your stool or on the toilet paper. If you underwent a bowel prep for your procedure, you may not have a normal bowel movement for a few days.  Please Note:  You might notice some irritation and congestion in your nose or some drainage.  This is from the oxygen used during your procedure.  There is no need for concern and it should clear up in a day or so.  SYMPTOMS TO REPORT IMMEDIATELY:  Following lower endoscopy (colonoscopy or flexible sigmoidoscopy):  Excessive amounts of blood in the stool  Significant tenderness or worsening of abdominal pains  Swelling of the abdomen that is new, acute  Fever of 100F or higher  Continue present medications Await pathology  results Office will refer patient for pelvic floor PT - history suggests outlet dysfunction as cause of constipation.   For urgent or emergent issues, a gastroenterologist can be reached at any hour by calling [phone]. Do not use MyChart messaging for urgent concerns.    DIET:  We do recommend a small meal at first, but then you may proceed to your regular diet.  Drink plenty of fluids but you should avoid alcoholic beverages for 24 hours.  ACTIVITY:  You should plan to take it easy for the rest of today and you should NOT DRIVE or use heavy machinery until tomorrow (because of the sedation medicines used during the test).    FOLLOW UP: Our staff will call the number listed on your records the next business day following your procedure.  We will call around 7:15- 8:00 am to check on you and address any questions or concerns that you may have regarding the information given to you following your procedure. If we do not reach you, we will leave a message.     If any biopsies were taken you will be contacted by phone or by letter within the next 1-3 weeks.  Please call us  at [phone] if you have not heard about the biopsies in 3 weeks.    SIGNATURES/CONFIDENTIALITY: You and/or your care partner have signed paperwork which will be entered into your electronic medical record.  These signatures attest to the fact that that the information above  on your After Visit Summary has been reviewed and is understood.  Full responsibility of the confidentiality of this discharge information lies with you and/or your care-partner.

## 2023-10-17 NOTE — Op Note (Signed)
 [facility] Patient Name: Sydney Bradley Procedure Date: 10/17/2023 4:15 PM MRN: [id number] Endoscopist: Lupita FORBES Commander , MD, [id number] Age: 43 Referring MD:  Date of Birth: 1980/05/18 Gender: Female Account #: [id number] Procedure:                Colonoscopy Indications:              Crohn's disease of the small bowel, Follow-up of                            Crohn's disease of the small bowel, Change in bowel                            habits, Constipation Procedure:                Pre-Anesthesia Assessment:                           - Prior to the procedure, a History and Physical                            was performed, and patient medications and                            allergies were reviewed. The patient's tolerance of                            previous anesthesia was also reviewed. The risks                            and benefits of the procedure and the sedation                            options and risks were discussed with the patient.                            All questions were answered, and informed consent                            was obtained. Prior Anticoagulants: The patient has                            taken no anticoagulant or antiplatelet agents. ASA                            Grade Assessment: III - A patient with severe                            systemic disease. After reviewing the risks and                            benefits, the patient was deemed in satisfactory                            condition to undergo the procedure.  After obtaining informed consent, the colonoscope                            was passed under direct vision. Throughout the                            procedure, the patient's blood pressure, pulse, and                            oxygen saturations were monitored continuously. The                            CF [id number] #[id number] was introduced through the anus                            and advanced  to the the terminal ileum, with                            identification of the appendiceal orifice and IC                            valve. The colonoscopy was performed without                            difficulty. The patient tolerated the procedure                            well. The quality of the bowel preparation was                            good. The terminal ileum, ileocecal valve,                            appendiceal orifice, and rectum were photographed.                            The bowel preparation used was SUPREP via split                            dose instruction. Scope In: 4:33:04 PM Scope Out: 4:51:06 PM Scope Withdrawal Time: 0 hours 14 minutes 2 seconds  Total Procedure Duration: 0 hours 18 minutes 2 seconds  Findings:                 The perianal and digital rectal examinations were                            normal.                           An area of mucosa in the terminal ileum was                            ulcerated. Biopsies were taken with a cold forceps  for histology. Verification of patient                            identification for the specimen was done. Estimated                            blood loss was minimal.                           A 1 to 2 mm polyp was found in the transverse                            colon. The polyp was sessile. The polyp was removed                            with a cold biopsy forceps. Resection and retrieval                            were complete. Verification of patient                            identification for the specimen was done. Estimated                            blood loss was minimal.                           The exam was otherwise without abnormality on                            direct and retroflexion views. Complications:            No immediate complications. Estimated Blood Loss:     Estimated blood loss was minimal. Impression:               - Ulcerated mucosa  in the terminal ileum. Biopsied.                            a few aphthous/superficial ulcers as seen in past                            aith known Crohn's ileitis                           - One 1 to 2 mm polyp in the transverse colon,                            removed with a cold biopsy forceps. Resected and                            retrieved.                           - The examination was otherwise normal on direct  and retroflexion views.                           - Family history of colon cancer father diagnosed                            age 28 Recommendation:           - Patient has a contact number available for                            emergencies. The signs and symptoms of potential                            delayed complications were discussed with the                            patient. Return to normal activities tomorrow.                            Written discharge instructions were provided to the                            patient.                           - Continue present medications.                           - Await pathology results.                           - Repeat colonoscopy is recommended. The                            colonoscopy date will be determined after pathology                            results from today's exam become available for                            review.                           - Office will refer patient for pelvic floor PT -                            history suggests outlet dysfunction as cause of                            constipation. Lupita FORBES Commander, MD 10/17/2023 5:06:17 PM This report has been signed electronically.

## 2023-10-17 NOTE — Progress Notes (Signed)
 Called to room to assist during endoscopic procedure.  Patient ID and intended procedure confirmed with present staff. Received instructions for my participation in the procedure from the performing physician.

## 2023-10-17 NOTE — Progress Notes (Signed)
 History and Physical Interval Note:  10/17/2023 4:23 PM  Sydney Bradley  has presented today for endoscopic procedure(s), with the diagnosis of  Encounter Diagnoses  Name Primary?   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  The various methods of evaluation and treatment have been discussed with the patient and/or family. After consideration of risks, benefits and other options for treatment, the patient has consented to  the endoscopic procedure(s).   The patient's history has been reviewed, patient examined, no change in status, stable for endoscopic procedure(s).  I have reviewed the patient's chart and labs.  Questions were answered to the patient's satisfaction.     Lupita CHARLENA Commander, MD, NOLIA

## 2023-10-18 ENCOUNTER — Telehealth: Payer: Self-pay | Admitting: *Deleted

## 2023-10-18 NOTE — Telephone Encounter (Signed)
 Left message on f/u call

## 2023-10-19 ENCOUNTER — Telehealth: Payer: Self-pay

## 2023-10-19 DIAGNOSIS — K5900 Constipation, unspecified: Secondary | ICD-10-CM

## 2023-10-19 NOTE — Telephone Encounter (Signed)
 Maddox informed that order placed per Dr Avram for pelvic floor PT-her history suggest outlet dysfunction as cause of constipation. She will await their call.

## 2023-10-20 LAB — SURGICAL PATHOLOGY

## 2023-10-21 ENCOUNTER — Ambulatory Visit: Payer: Self-pay | Admitting: Internal Medicine

## 2023-11-03 ENCOUNTER — Encounter: Payer: Self-pay | Admitting: Internal Medicine

## 2023-11-07 ENCOUNTER — Other Ambulatory Visit (INDEPENDENT_AMBULATORY_CARE_PROVIDER_SITE_OTHER): Payer: Self-pay | Admitting: Adult Health

## 2023-11-07 DIAGNOSIS — F5089 Other specified eating disorder: Secondary | ICD-10-CM

## 2023-11-07 DIAGNOSIS — Z6841 Body Mass Index (BMI) 40.0 and over, adult: Secondary | ICD-10-CM

## 2023-11-09 ENCOUNTER — Encounter (INDEPENDENT_AMBULATORY_CARE_PROVIDER_SITE_OTHER): Payer: Self-pay | Admitting: Adult Health

## 2023-11-09 ENCOUNTER — Ambulatory Visit (INDEPENDENT_AMBULATORY_CARE_PROVIDER_SITE_OTHER): Admitting: Adult Health

## 2023-11-09 VITALS — BP 136/83 | HR 92 | Temp 98.8°F | Ht 69.0 in | Wt 321.0 lb

## 2023-11-09 DIAGNOSIS — R632 Polyphagia: Secondary | ICD-10-CM

## 2023-11-09 DIAGNOSIS — R194 Change in bowel habit: Secondary | ICD-10-CM

## 2023-11-09 DIAGNOSIS — E559 Vitamin D deficiency, unspecified: Secondary | ICD-10-CM | POA: Diagnosis not present

## 2023-11-09 DIAGNOSIS — R7303 Prediabetes: Secondary | ICD-10-CM

## 2023-11-09 DIAGNOSIS — E66813 Obesity, class 3: Secondary | ICD-10-CM

## 2023-11-09 DIAGNOSIS — Z6841 Body Mass Index (BMI) 40.0 and over, adult: Secondary | ICD-10-CM

## 2023-11-09 MED ORDER — METFORMIN HCL ER 500 MG PO TB24: ORAL_TABLET | ORAL | 0 refills | Status: DC

## 2023-11-09 MED ORDER — VITAMIN D (ERGOCALCIFEROL) 1.25 MG (50000 UNIT) PO CAPS: 50000.0000 [IU] | ORAL_CAPSULE | ORAL | 0 refills | Status: DC

## 2023-11-09 NOTE — Progress Notes (Signed)
 WEIGHT SUMMARY AND BIOMETRICS  Vitals Temp: 98.8 F (37.1 C) BP: 136/83 Pulse Rate: 92 SpO2: 100 %   Anthropometric Measurements Height: 5' 9 (1.753 m) Weight: (!) 321 lb (145.6 kg) BMI (Calculated): 47.38 Weight at Last Visit: 327 lb Weight Lost Since Last Visit: 6 lb Weight Gained Since Last Visit: 0 Starting Weight: 343 lb Total Weight Loss (lbs): 22 lb (9.979 kg)   Body Composition  Body Fat %: 53.2 % Fat Mass (lbs): 170.8 lbs Muscle Mass (lbs): 142.8 lbs Total Body Water (lbs): 101 lbs Visceral Fat Rating : 18   Other Clinical Data Fasting: yes Labs: yes Today's Visit #: 9 Starting Date: 02/08/23    Chief Complaint:   OBESITY Sydney Bradley is here to discuss her progress with her obesity treatment plan.  She is on the the Category 3 Plan and states she is following her eating plan approximately 70-80 % of the time.  She states she is exercising Walking minutes 2 times per week.  Interim History:  Recently seen by GI/Dr. Avram 10/17/2023 Colonoscopy completed Reviewed study results and GI communication  She is taking plain Metformin  500mg  with first meal of the day. Will she will intermittently expericene abdominal cramping and loose stools. Discussed changing plain to formulation to XR and take with evening meal- she is agreeable to converting to XR formulation  She estimates to walk 3K steps per day- most days of the week  Subjective:   1. Vitamin D  deficiency She is on weekly Ergocalciferol - denies N/V/Muscle Weakness  2. Prediabetes She is taking plain Metformin  500mg  with first meal of the day. Will she will intermittently expericene abdominal cramping and loose stools. Discussed changing plain to formulation to XR and take with evening meal- she is agreeable to converting to XR formulation  Lab Results  Component Value Date   HGBA1C 5.7 (H) 02/08/2023   HGBA1C 5.6 08/14/2019   HGBA1C 5.8 11/11/2017     3. Change in bowel  habits 10/17/2023 Procedure:                Colonoscopy Indications:              Crohn's disease of the small bowel, Follow-up of                            Crohn's disease of the small bowel, Change in bowel                            habits, Constipation Impression:               - Ulcerated mucosa in the terminal ileum. Biopsied.                            a few aphthous/superficial ulcers as seen in past                            aith known Crohn's ileitis                           - One 1 to 2 mm polyp in the transverse colon,  removed with a cold biopsy forceps. Resected and                            retrieved.                           - The examination was otherwise normal on direct                            and retroflexion views.                           - Family history of colon cancer father diagnosed                            age 28 Recommendation:           - Patient has a contact number available for                            emergencies. The signs and symptoms of potential                            delayed complications were discussed with the                            patient. Return to normal activities tomorrow.                            Written discharge instructions were provided to the                            patient.                           - Continue present medications.                           - Await pathology results.                           - Repeat colonoscopy is recommended. The                            colonoscopy date will be determined after pathology                            results from today's exam become available for                            review.                           - Office will refer patient for pelvic floor PT -                            history suggests outlet dysfunction as  cause of                            constipation.  Assessment/Plan:   1. Polyphagia (Primary) Continue to  increase lean protein and regular walking  2. Vitamin D  deficiency Check Labs - Vitamin D  (25 hydroxy) Refill Vitamin D , Ergocalciferol , (DRISDOL ) 1.25 MG (50000 UNIT) CAPS capsule Take 1 capsule (50,000 Units total) by mouth every 7 (seven) days. Dispense: 5 capsule, Refills: 0 ordered   3. Prediabetes Check Labs - Magnesium  - Hemoglobin A1c - Insulin , random Stop plain Metformin  500mg  Start  metFORMIN  (GLUCOPHAGE -XR) 500 MG 24 hr tablet Take one tablet with dinner Dispense: 30 tablet, Refills: 0 ordered   4. Change in bowel habits Monitor sx's  F/u with established Gastroenterology  5. Class 3 severe obesity due to excess calories with serious comorbidity and body mass index (BMI) of 45.0 to 49.9 in adult, CURRENT BMI 47.4  Sydney Bradley is currently in the action stage of change. As such, her goal is to continue with weight loss efforts. She has agreed to the Category 3 Plan.   Exercise goals: All adults should avoid inactivity. Some physical activity is better than none, and adults who participate in any amount of physical activity gain some health benefits. Adults should also include muscle-strengthening activities that involve all major muscle groups on 2 or more days a week.  Behavioral modification strategies: increasing lean protein intake, decreasing simple carbohydrates, increasing vegetables, increasing water intake, meal planning and cooking strategies, keeping healthy foods in the home, ways to avoid boredom eating, ways to avoid night time snacking, and planning for success.  Sydney Bradley has agreed to follow-up with our clinic in 4 weeks. She was informed of the importance of frequent follow-up visits to maximize her success with intensive lifestyle modifications for her multiple health conditions.   Sydney Bradley was informed we would discuss her lab results at her next visit unless there is a critical issue that needs to be addressed sooner. Sydney Bradley agreed to keep her next visit at the agreed  upon time to discuss these results.  Objective:   Blood pressure 136/83, pulse 92, temperature 98.8 F (37.1 C), height 5' 9 (1.753 m), weight (!) 321 lb (145.6 kg), SpO2 100%. Body mass index is 47.4 kg/m.  General: Cooperative, alert, well developed, in no acute distress. HEENT: Conjunctivae and lids unremarkable. Cardiovascular: Regular rhythm.  Lungs: Normal work of breathing. Neurologic: No focal deficits.   Lab Results  Component Value Date   CREATININE 0.78 09/26/2023   BUN 11 09/26/2023   NA 138 09/26/2023   K 3.9 09/26/2023   CL 108 09/26/2023   CO2 23 09/26/2023   Lab Results  Component Value Date   ALT 25 09/26/2023   AST 20 09/26/2023   ALKPHOS 59 09/26/2023   BILITOT 0.3 09/26/2023   Lab Results  Component Value Date   HGBA1C 5.7 (H) 02/08/2023   HGBA1C 5.6 08/14/2019   HGBA1C 5.8 11/11/2017   Lab Results  Component Value Date   INSULIN  12.3 02/08/2023   Lab Results  Component Value Date   TSH 3.56 09/26/2023   Lab Results  Component Value Date   CHOL 151 02/08/2023   HDL 44 02/08/2023   LDLCALC 90 02/08/2023   TRIG 89 02/08/2023   CHOLHDL 3 08/14/2019   Lab Results  Component Value Date   VD25OH 21.1 (L) 02/08/2023   VD25OH 21.48 (L) 12/18/2021   Lab Results  Component Value Date   WBC 5.6 09/26/2023   HGB 12.8 09/26/2023   HCT 39.5 09/26/2023   MCV 82.6 09/26/2023   PLT 249.0 09/26/2023   Lab Results  Component Value Date   IRON 74 12/18/2021   TIBC 289.8 12/18/2021   FERRITIN 77.6 12/18/2021   Attestation Statements:   Reviewed by clinician on day of visit: allergies, medications, problem list, medical history, surgical history, family history, social history, and previous encounter notes.  I have reviewed the above documentation for accuracy and completeness, and I agree with the above. -  Salbador Fiveash d. Ailen Strauch, NP-C

## 2023-11-10 ENCOUNTER — Other Ambulatory Visit (INDEPENDENT_AMBULATORY_CARE_PROVIDER_SITE_OTHER): Payer: Self-pay | Admitting: Adult Health

## 2023-11-10 ENCOUNTER — Encounter (INDEPENDENT_AMBULATORY_CARE_PROVIDER_SITE_OTHER): Payer: Self-pay | Admitting: Adult Health

## 2023-11-10 DIAGNOSIS — F5089 Other specified eating disorder: Secondary | ICD-10-CM

## 2023-11-10 DIAGNOSIS — Z6841 Body Mass Index (BMI) 40.0 and over, adult: Secondary | ICD-10-CM

## 2023-11-10 LAB — MAGNESIUM: Magnesium: 2 mg/dL (ref 1.6–2.3)

## 2023-11-10 LAB — HEMOGLOBIN A1C
Est. average glucose Bld gHb Est-mCnc: 108 mg/dL
Hgb A1c MFr Bld: 5.4 % (ref 4.8–5.6)

## 2023-11-10 LAB — VITAMIN D 25 HYDROXY (VIT D DEFICIENCY, FRACTURES): Vit D, 25-Hydroxy: 19.9 ng/mL — ABNORMAL LOW (ref 30.0–100.0)

## 2023-11-10 LAB — INSULIN, RANDOM: INSULIN: 20 u[IU]/mL (ref 2.6–24.9)

## 2023-11-10 MED ORDER — TOPIRAMATE 100 MG PO TABS: 100.0000 mg | ORAL_TABLET | Freq: Every day | ORAL | 0 refills | Status: DC

## 2023-11-18 ENCOUNTER — Ambulatory Visit: Admitting: Physician Assistant

## 2023-12-07 ENCOUNTER — Other Ambulatory Visit (INDEPENDENT_AMBULATORY_CARE_PROVIDER_SITE_OTHER): Payer: Self-pay | Admitting: Adult Health

## 2023-12-07 ENCOUNTER — Other Ambulatory Visit: Payer: Self-pay | Admitting: Adult Health

## 2023-12-07 DIAGNOSIS — F5089 Other specified eating disorder: Secondary | ICD-10-CM

## 2023-12-07 DIAGNOSIS — Z6841 Body Mass Index (BMI) 40.0 and over, adult: Secondary | ICD-10-CM

## 2023-12-07 DIAGNOSIS — F419 Anxiety disorder, unspecified: Secondary | ICD-10-CM

## 2023-12-14 ENCOUNTER — Ambulatory Visit: Admitting: Adult Health

## 2023-12-19 ENCOUNTER — Ambulatory Visit (INDEPENDENT_AMBULATORY_CARE_PROVIDER_SITE_OTHER): Admitting: Adult Health

## 2023-12-19 ENCOUNTER — Encounter (INDEPENDENT_AMBULATORY_CARE_PROVIDER_SITE_OTHER): Payer: Self-pay | Admitting: Adult Health

## 2023-12-19 VITALS — BP 132/84 | HR 71 | Temp 98.2°F | Ht 69.0 in | Wt 324.0 lb

## 2023-12-19 DIAGNOSIS — F5089 Other specified eating disorder: Secondary | ICD-10-CM | POA: Diagnosis not present

## 2023-12-19 DIAGNOSIS — R632 Polyphagia: Secondary | ICD-10-CM

## 2023-12-19 DIAGNOSIS — Z7984 Long term (current) use of oral hypoglycemic drugs: Secondary | ICD-10-CM

## 2023-12-19 DIAGNOSIS — R7303 Prediabetes: Secondary | ICD-10-CM | POA: Diagnosis not present

## 2023-12-19 DIAGNOSIS — E559 Vitamin D deficiency, unspecified: Secondary | ICD-10-CM | POA: Diagnosis not present

## 2023-12-19 DIAGNOSIS — Z6841 Body Mass Index (BMI) 40.0 and over, adult: Secondary | ICD-10-CM

## 2023-12-19 DIAGNOSIS — E66813 Obesity, class 3: Secondary | ICD-10-CM

## 2023-12-19 MED ORDER — METFORMIN HCL ER 500 MG PO TB24: ORAL_TABLET | ORAL | 0 refills | Status: DC

## 2023-12-19 MED ORDER — VITAMIN D (ERGOCALCIFEROL) 1.25 MG (50000 UNIT) PO CAPS: 50000.0000 [IU] | ORAL_CAPSULE | ORAL | 0 refills | Status: DC

## 2023-12-19 MED ORDER — TOPIRAMATE 100 MG PO TABS: 100.0000 mg | ORAL_TABLET | Freq: Every day | ORAL | 0 refills | Status: DC

## 2023-12-19 NOTE — Progress Notes (Signed)
 WEIGHT SUMMARY AND BIOMETRICS  Vitals Temp: 98.2 F (36.8 C) BP: 132/84 Pulse Rate: 71 SpO2: 97 %   Anthropometric Measurements Height: 5' 9 (1.753 m) Weight: (!) 324 lb (147 kg) BMI (Calculated): 47.82 Weight at Last Visit: 321   lb Weight Lost Since Last Visit: 0 Weight Gained Since Last Visit: 3 lb Starting Weight: 343 lb Total Weight Loss (lbs): 19 lb (8.618 kg)   Body Composition  Body Fat %: 53.7 % Fat Mass (lbs): 174.4 lbs Muscle Mass (lbs): 142.6 lbs Total Body Water (lbs): 104.4 lbs Visceral Fat Rating : 18   Other Clinical Data Fasting: no Labs: no Today's Visit #: 10 Starting Date: 02/08/23    Chief Complaint:   OBESITY Sydney Bradley is here to discuss her progress with her obesity treatment plan.  She is on the the Category 3 Plan and states she is following her eating plan approximately 50 % of the time.  She states she is exercising Walking/Cubio Machine 10 minutes 1-2 times per week.  Interim History:  She lives with her husband and 29 year son. She will often prepare a healthy meal for the family and then eat off plan, ie: sweets  She has increased daily water intake and states my urine doesn't look like apple juice, more like lemonade now.  11/09/2023 Metformin  500mg  replaced with Metformin  XR 500mg - due to GI upset She reports tolerating the XR formulation much better than plain formulation.   Subjective:   1. Polyphagia  Latest Reference Range & Units 09/26/23 10:29  GFR >60.00 mL/min 92.42   She will experience late afternoon and evening polyphagia, even when she eats on plan. She finds herself snacking on simple CHO foods, will even awake to eat.  04/28/2023 started on Qysmia Phentermine  stopped due to constipation and upcoming colonoscopy. She continued on daily Topamax  100mg  She reports rhythm/withdraw method for birth controlled. Consoled that Topamax  is tetragenic  2. Prediabetes  Latest Reference Range & Units 09/26/23  10:29  GFR >60.00 mL/min 92.42   Lab Results  Component Value Date   HGBA1C 5.4 11/09/2023   HGBA1C 5.7 (H) 02/08/2023   HGBA1C 5.6 08/14/2019    11/09/2023 Metformin  500mg  replaced with Metformin  XR 500mg - due to GI upset She reports tolerating the XR formulation much better than plain formulation. She will experience late afternoon and evening polyphagia, even when she eats on plan. She finds herself snacking on simple CHO foods, will even awake to eat.  Agreeable to dose increase of daily Metformin  XR  3. 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 Polyphagia (Primary) Refill and INCREASE metFORMIN  (GLUCOPHAGE -XR) 500 MG 24 hr tablet Take two tablets with dinner Dispense: 60 tablet, Refills: 0 ordered   2. Prediabetes Refill and INCREASE metFORMIN  (GLUCOPHAGE -XR) 500 MG 24 hr tablet Take two tablets with dinner Dispense: 60 tablet, Refills: 0 ordered   3. Vitamin D  deficiency Refill  Vitamin D , Ergocalciferol , (DRISDOL ) 1.25 MG (50000 UNIT) CAPS capsule Take 1 capsule (50,000 Units total) by mouth every 7 (seven) days. Dispense: 5 capsule, Refills: 0 ordered   4. Other disorder of eating Refill - topiramate  (TOPAMAX ) 100 MG tablet; Take 1 tablet (100 mg total) by mouth daily before supper.  Dispense: 30 tablet; Refill: 0 AVOID PREGNANCY  5. Class 3 severe obesity due to excess calories with serious comorbidity and body mass index (BMI) of 45.0 to 49.9 in adult, CURRENT BMI 47.9 Refill - topiramate  (TOPAMAX ) 100 MG tablet; Take 1 tablet (100 mg total) by mouth daily before supper.  Dispense: 30 tablet; Refill: 0  Sydney Bradley is currently in the action stage of change. As such, her goal is to continue with weight loss efforts. She has agreed to  the Category 3 Plan.   Exercise goals: All adults should avoid inactivity. Some physical activity is better than none, and adults who participate in any amount of physical activity gain some health benefits. Adults should also include muscle-strengthening activities that involve all major muscle groups on 2 or more days a week.  Behavioral modification strategies: increasing lean protein intake, decreasing simple carbohydrates, increasing vegetables, increasing water intake, meal planning and cooking strategies, keeping healthy foods in the home, ways to avoid boredom eating, ways to avoid night time snacking, and planning for success.  Sydney Bradley has agreed to follow-up with our clinic in 4 weeks. She was informed of the importance of frequent follow-up visits to maximize her success with intensive lifestyle modifications for her multiple health conditions.   Check Fasting Labs at next OV  Objective:   Blood pressure 132/84, pulse 71, temperature 98.2 F (36.8 C), height 5' 9 (1.753 m), weight (!) 324 lb (147 kg), SpO2 97%. Body mass index is 47.85 kg/m.  General: Cooperative, alert, well developed, in no acute distress. HEENT: Conjunctivae and lids unremarkable. Cardiovascular: Regular rhythm.  Lungs: Normal work of breathing. Neurologic: No focal deficits.   Lab Results  Component Value Date   CREATININE 0.78 09/26/2023   BUN 11 09/26/2023   NA 138 09/26/2023   K 3.9 09/26/2023   CL 108 09/26/2023   CO2 23 09/26/2023   Lab Results  Component Value Date   ALT 25 09/26/2023   AST 20 09/26/2023   ALKPHOS 59 09/26/2023   BILITOT 0.3 09/26/2023   Lab Results  Component Value Date   HGBA1C 5.4 11/09/2023   HGBA1C 5.7 (H) 02/08/2023   HGBA1C 5.6 08/14/2019   HGBA1C 5.8 11/11/2017   Lab Results  Component Value Date   INSULIN  20.0 11/09/2023   INSULIN  12.3 02/08/2023   Lab Results  Component Value Date   TSH 3.56 09/26/2023   Lab Results  Component Value Date   CHOL  151 02/08/2023   HDL 44 02/08/2023   LDLCALC 90 02/08/2023   TRIG 89 02/08/2023   CHOLHDL 3 08/14/2019   Lab Results  Component Value Date   VD25OH 19.9 (L) 11/09/2023   VD25OH 21.1 (L) 02/08/2023   VD25OH 21.48 (L) 12/18/2021   Lab Results  Component Value Date   WBC 5.6 09/26/2023   HGB 12.8 09/26/2023   HCT 39.5 09/26/2023   MCV 82.6 09/26/2023   PLT 249.0 09/26/2023   Lab Results  Component Value Date   IRON 74 12/18/2021   TIBC 289.8 12/18/2021   FERRITIN 77.6 12/18/2021   Attestation Statements:   Reviewed by clinician on day of visit: allergies, medications, problem list, medical history, surgical history, family history, social history, and previous encounter notes.  I have reviewed the above documentation for accuracy and completeness, and I agree with the above. -  Rodricus Candelaria d. Marque Bango, NP-C

## 2024-01-05 ENCOUNTER — Encounter: Payer: Self-pay | Admitting: Adult Health

## 2024-01-05 ENCOUNTER — Ambulatory Visit: Admitting: Adult Health

## 2024-01-05 VITALS — BP 140/80 | HR 86 | Temp 98.7°F | Ht 69.0 in | Wt 327.0 lb

## 2024-01-05 DIAGNOSIS — F322 Major depressive disorder, single episode, severe without psychotic features: Secondary | ICD-10-CM

## 2024-01-05 DIAGNOSIS — F419 Anxiety disorder, unspecified: Secondary | ICD-10-CM | POA: Diagnosis not present

## 2024-01-05 MED ORDER — SERTRALINE HCL 50 MG PO TABS: 50.0000 mg | ORAL_TABLET | Freq: Every day | ORAL | 3 refills | Status: DC
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
## 2024-01-12 ENCOUNTER — Encounter: Payer: Self-pay | Admitting: Adult Health

## 2024-01-12 NOTE — Telephone Encounter (Signed)
 FYI

## 2024-01-16 ENCOUNTER — Telehealth: Payer: Self-pay | Admitting: *Deleted

## 2024-01-16 ENCOUNTER — Encounter (INDEPENDENT_AMBULATORY_CARE_PROVIDER_SITE_OTHER): Payer: Self-pay | Admitting: Adult Health

## 2024-01-16 ENCOUNTER — Ambulatory Visit (INDEPENDENT_AMBULATORY_CARE_PROVIDER_SITE_OTHER): Admitting: Adult Health

## 2024-01-16 VITALS — BP 131/89 | HR 86 | Temp 98.8°F | Ht 69.0 in | Wt 320.0 lb

## 2024-01-16 DIAGNOSIS — E66813 Obesity, class 3: Secondary | ICD-10-CM

## 2024-01-16 DIAGNOSIS — R7303 Prediabetes: Secondary | ICD-10-CM

## 2024-01-16 DIAGNOSIS — R632 Polyphagia: Secondary | ICD-10-CM | POA: Diagnosis not present

## 2024-01-16 DIAGNOSIS — Z6841 Body Mass Index (BMI) 40.0 and over, adult: Secondary | ICD-10-CM

## 2024-01-16 DIAGNOSIS — F5089 Other specified eating disorder: Secondary | ICD-10-CM

## 2024-01-16 DIAGNOSIS — F419 Anxiety disorder, unspecified: Secondary | ICD-10-CM | POA: Diagnosis not present

## 2024-01-16 DIAGNOSIS — E559 Vitamin D deficiency, unspecified: Secondary | ICD-10-CM | POA: Diagnosis not present

## 2024-01-16 MED ORDER — VITAMIN D (ERGOCALCIFEROL) 1.25 MG (50000 UNIT) PO CAPS: 50000.0000 [IU] | ORAL_CAPSULE | ORAL | 0 refills | Status: DC

## 2024-01-16 MED ORDER — TOPIRAMATE 100 MG PO TABS: 100.0000 mg | ORAL_TABLET | Freq: Every day | ORAL | 0 refills | Status: DC

## 2024-01-16 MED ORDER — METFORMIN HCL ER 500 MG PO TB24: ORAL_TABLET | ORAL | 0 refills | Status: DC

## 2024-01-16 NOTE — Telephone Encounter (Signed)
 Copied from CRM [id number]. Topic: General - Other >> Jan 16, 2024  1:52 PM Harlene ORN wrote: Reason for CRM: [place] - [employer] disability  Rep called to confirm if the patient has been seen by her PCP.  Phone: [phone]
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
## 2024-01-17 LAB — COMPREHENSIVE METABOLIC PANEL WITH GFR
ALT: 12 IU/L (ref 0–32)
AST: 14 IU/L (ref 0–40)
Albumin: 3.9 g/dL (ref 3.9–4.9)
Alkaline Phosphatase: 75 IU/L (ref 41–116)
BUN/Creatinine Ratio: 8 — ABNORMAL LOW (ref 9–23)
BUN: 8 mg/dL (ref 6–24)
Bilirubin Total: 0.4 mg/dL (ref 0.0–1.2)
CO2: 18 mmol/L — ABNORMAL LOW (ref 20–29)
Calcium: 9.6 mg/dL (ref 8.7–10.2)
Chloride: 107 mmol/L — ABNORMAL HIGH (ref 96–106)
Creatinine, Ser: 1 mg/dL (ref 0.57–1.00)
Globulin, Total: 2.7 g/dL (ref 1.5–4.5)
Glucose: 83 mg/dL (ref 70–99)
Potassium: 4.1 mmol/L (ref 3.5–5.2)
Sodium: 138 mmol/L (ref 134–144)
Total Protein: 6.6 g/dL (ref 6.0–8.5)
eGFR: 71 mL/min/1.73 (ref >59)

## 2024-01-17 LAB — INSULIN, RANDOM: INSULIN: 20.1 u[IU]/mL (ref 2.6–24.9)

## 2024-01-17 LAB — HEMOGLOBIN A1C
Est. average glucose Bld gHb Est-mCnc: 111 mg/dL
Hgb A1c MFr Bld: 5.5 % (ref 4.8–5.6)

## 2024-01-17 LAB — TSH+FREE T4
Free T4: 1.04 ng/dL (ref 0.82–1.77)
TSH: 2.98 u[IU]/mL (ref 0.450–4.500)

## 2024-01-17 LAB — VITAMIN D 25 HYDROXY (VIT D DEFICIENCY, FRACTURES): Vit D, 25-Hydroxy: 24 ng/mL — ABNORMAL LOW (ref 30.0–100.0)

## 2024-01-18 NOTE — Telephone Encounter (Signed)
 Tried to call the number back. However, its for the employee and it ask for my social. They will be able to get this answer via FMLA PPW that will be faxed.

## 2024-01-19 ENCOUNTER — Telehealth (INDEPENDENT_AMBULATORY_CARE_PROVIDER_SITE_OTHER): Admitting: Adult Health

## 2024-01-19 DIAGNOSIS — F331 Major depressive disorder, recurrent, moderate: Secondary | ICD-10-CM | POA: Diagnosis not present

## 2024-01-19 DIAGNOSIS — F419 Anxiety disorder, unspecified: Secondary | ICD-10-CM | POA: Diagnosis not present

## 2024-01-19 NOTE — Progress Notes (Signed)
 Virtual Visit via Video Note  I connected with Sydney Bradley on 01/19/24 at 11:00 AM EDT by a video enabled telemedicine application and verified that I am speaking with the correct person using two identifiers.  Location patient: home Location provider:work or home office Persons participating in the virtual visit: patient, provider  I discussed the limitations of evaluation and management by telemedicine and the availability of in person appointments. The patient expressed understanding and agreed to proceed.   HPI: She is a pleasant 43 year old female who  has a past medical history of Allergy, Anxiety, Arthritis, Asthma, Back pain, Constipation, Crohn's disease of ileum (HCC), Fatty liver, GERD (gastroesophageal reflux disease), High blood pressure, Hypertension, IBS (irritable bowel syndrome), Joint pain, Morbid obesity with BMI of 45.0-49.9, adult (HCC), Recurrent upper respiratory infection (URI), Sacral fracture (HCC), Urticaria, Vitamin B 12 deficiency, and Vitamin D  deficiency.  She has been suffering with severe anxiety and major depression due to stressors at work. She is currently on Wellbutrin  300 mg XR daily and was placed on Zoloft  50 mg daily a few weeks ago. She has decided to take off some time from work and needs FMLA paperwork filled out.   She has an appointment with a psychiatrist on 03/14/2024  Since being out of work she does feel like she is getting back to being her self.    ROS: See pertinent positives and negatives per HPI.  Past Medical History:  Diagnosis Date   Allergy    Anxiety    Arthritis    L5 Back - no meds - otc prn   Asthma    Back pain    Constipation    Crohn's disease of ileum (HCC)    Fatty liver    GERD (gastroesophageal reflux disease)    High blood pressure    Hypertension    no meds x 3 yrs   IBS (irritable bowel syndrome)    Joint pain    Morbid obesity with BMI of 45.0-49.9, adult (HCC)    Recurrent upper respiratory infection (URI)     Sacral fracture (HCC)    Urticaria    Vitamin B 12 deficiency    Vitamin D  deficiency     Past Surgical History:  Procedure Laterality Date   ADENOIDECTOMY     COLONOSCOPY     DILATION AND EVACUATION  02/25/2012   Procedure: DILATATION AND EVACUATION;  Surgeon: Rosaline DELENA Luna, MD;  Location: [facility];  Service: Gynecology;  Laterality: N/A;   DILATION AND EVACUATION N/A 03/01/2013   Procedure: DILATATION AND EVACUATION;  Surgeon: Oneil FORBES Piety, MD;  Location: [facility];  Service: Gynecology;  Laterality: N/A;   TONSILLECTOMY     TONSILLECTOMY AND ADENOIDECTOMY  2007    Family History  Problem Relation Age of Onset   Depression Mother    Obesity Mother    Colon cancer Father 41       mets to liver and bone died at 63   Anxiety disorder Father    Bipolar disorder Father    Drug abuse Father    Obesity Father    Angioedema Sister    COPD Maternal Grandmother    Dementia Maternal Grandmother    Alzheimer's disease Maternal Grandfather    Cancer Paternal Grandmother        blood cancer?   Stroke Paternal Grandmother    Eczema Son    Asthma Son    Allergic rhinitis Son    Stomach cancer Neg Hx  Esophageal cancer Neg Hx        Current Outpatient Medications:    albuterol  (PROVENTIL ) (2.5 MG/3ML) 0.083% nebulizer solution, Take 3 mLs (2.5 mg total) by nebulization every 4 (four) hours as needed for wheezing or shortness of breath., Disp: 75 mL, Rfl: 0   albuterol  (VENTOLIN  HFA) 108 (90 Base) MCG/ACT inhaler, Inhale 2 puffs into the lungs every 4 (four) hours as needed for wheezing or shortness of breath., Disp: 18 g, Rfl: 1   buPROPion  (WELLBUTRIN  XL) 300 MG 24 hr tablet, TAKE ONE TABLET BY MOUTH ONCE A DAY, Disp: 90 tablet, Rfl: 0   clotrimazole -betamethasone  (LOTRISONE ) cream, Apply topically., Disp: , Rfl:    diphenhydrAMINE  (BENADRYL ) 25 MG tablet, Take 25 mg by mouth as needed for allergies. , Disp: , Rfl:    EPINEPHrine  0.3 mg/0.3 mL IJ SOAJ injection, Inject  0.3 mg into the muscle as needed for anaphylaxis., Disp: 2 each, Rfl: 1   famotidine  (PEPCID ) 20 MG tablet, Take 1 tablet (20 mg total) by mouth 2 (two) times daily., Disp: 60 tablet, Rfl: 5   fluticasone  (FLONASE ) 50 MCG/ACT nasal spray, Place 2 sprays into both nostrils daily., Disp: 16 g, Rfl: 6   levocetirizine (XYZAL ) 5 MG tablet, Take up to 2 tablets twice daily by mouth as needed for hives., Disp: 120 tablet, Rfl: 5   metFORMIN  (GLUCOPHAGE -XR) 500 MG 24 hr tablet, Take two tablets with dinner, Disp: 60 tablet, Rfl: 0   montelukast  (SINGULAIR ) 10 MG tablet, Take 1 tablet (10 mg total) by mouth at bedtime., Disp: 30 tablet, Rfl: 5   Olopatadine  HCl (PATADAY ) 0.2 % SOLN, 1 drop each eye daily as needed for itchy/watery eyes., Disp: 2.5 mL, Rfl: 3   omeprazole  (PRILOSEC) 40 MG capsule, TAKE ONE CAPSULE BY MOUTH ONCE A DAY BEFORE BREAKFAST, Disp: 90 capsule, Rfl: 3   sertraline  (ZOLOFT ) 50 MG tablet, Take 1 tablet (50 mg total) by mouth daily., Disp: 30 tablet, Rfl: 3   topiramate  (TOPAMAX ) 100 MG tablet, Take 1 tablet (100 mg total) by mouth daily before supper., Disp: 30 tablet, Rfl: 0   traZODone  (DESYREL ) 50 MG tablet, TAKE ONE OR TWO TABLETS BY MOUTH AT BEDTIME AS NEEDED FOR SLEEP, Disp: 60 tablet, Rfl: 6   Vitamin D , Ergocalciferol , (DRISDOL ) 1.25 MG (50000 UNIT) CAPS capsule, Take 1 capsule (50,000 Units total) by mouth every 7 (seven) days., Disp: 5 capsule, Rfl: 0  Current Facility-Administered Medications:    omalizumab  (XOLAIR ) prefilled syringe 300 mg, 300 mg, Subcutaneous, Q28 days, Padgett, Danita Macintosh, MD, 300 mg at 02/14/23 1629  EXAM:  VITALS per patient if applicable:  GENERAL: alert, oriented, appears well and in no acute distress  HEENT: atraumatic, conjunttiva clear, no obvious abnormalities on inspection of external nose and ears  NECK: normal movements of the head and neck  LUNGS: on inspection no signs of respiratory distress, breathing rate appears normal, no  obvious gross SOB, gasping or wheezing  CV: no obvious cyanosis  MS: moves all visible extremities without noticeable abnormality  PSYCH/NEURO: pleasant and cooperative, no obvious depression or anxiety, speech and thought processing grossly intact  ASSESSMENT AND PLAN:  Discussed the following assessment and plan:  1. Depression, major, recurrent, moderate (HCC) (Primary) - Short term disability and FMLA filled out for her  - We will see her back in the office in 2 weeks for follow up.   2. Anxiety       I discussed the assessment and treatment plan with the  patient. The patient was provided an opportunity to ask questions and all were answered. The patient agreed with the plan and demonstrated an understanding of the instructions.   The patient was advised to call back or seek an in-person evaluation if the symptoms worsen or if the condition fails to improve as anticipated.   Darleene Shape, NP   I personally spent a total of 31 minutes in the care of the patient today including preparing to see the patient, getting/reviewing separately obtained history, counseling and educating, documenting clinical information in the EHR, and listening.

## 2024-01-23 ENCOUNTER — Ambulatory Visit: Attending: Internal Medicine

## 2024-01-23 ENCOUNTER — Other Ambulatory Visit: Payer: Self-pay

## 2024-01-23 DIAGNOSIS — M5459 Other low back pain: Secondary | ICD-10-CM | POA: Insufficient documentation

## 2024-01-23 DIAGNOSIS — K5900 Constipation, unspecified: Secondary | ICD-10-CM | POA: Insufficient documentation

## 2024-01-23 DIAGNOSIS — R279 Unspecified lack of coordination: Secondary | ICD-10-CM | POA: Insufficient documentation

## 2024-01-23 DIAGNOSIS — M6281 Muscle weakness (generalized): Secondary | ICD-10-CM | POA: Diagnosis present

## 2024-01-23 DIAGNOSIS — R1030 Lower abdominal pain, unspecified: Secondary | ICD-10-CM | POA: Insufficient documentation

## 2024-01-23 DIAGNOSIS — R293 Abnormal posture: Secondary | ICD-10-CM | POA: Diagnosis present

## 2024-01-23 NOTE — Patient Instructions (Signed)
  Urge Drill  When you feel an urge to go, follow these steps to regain control: Stop what you are doing and be still Take one deep breath, directing your air into your abdomen Think an affirming thought, such as "I've got this." Do 5 quick flicks of your pelvic floor Walk with control to the bathroom to void, or delay voiding   When trying to have bowel movement:  1. Exhale like you're trying to blow up a tight balloon  2. Visualize your pelvic floor lengthening like a hammock lowering to the ground and the anus opening   Squatty potty: When your knees are level or below the level of your hips, pelvic floor muscles are pressed against rectum, preventing ease of bowel movement. By getting knees above the level of the hips, these pelvic floor muscles relax, allowing easier passage of bowel movement. Ways to get knees above hips: o Squatty Potty (7inch and 9inch versions) o Small stool o Roll of toilet paper under each foot o Hardback book or stack of magazines under each foot  Relaxed Toileting mechanics: Once in this position, make sure to lean forward with forearms on thighs, wide knees, relaxed stomach, and breathe.   [facility] [address], [address] [place], [place] [address] Phone # [phone] Fax [phone]

## 2024-01-23 NOTE — Therapy (Signed)
 OUTPATIENT PHYSICAL THERAPY FEMALE PELVIC EVALUATION   Patient Name: Sydney Bradley MRN: [id number] DOB:July 20, 1980, 43 y.o., female Today's Date: 01/23/2024  END OF SESSION:  PT End of Session - 01/23/24 0846     Visit Number 1    Date for Recertification  07/09/24    Authorization Type [employer]    PT Start Time 0845    PT Stop Time 0925    PT Time Calculation (min) 40 min    Activity Tolerance Patient tolerated treatment well    Behavior During Therapy WFL for tasks assessed/performed          Past Medical History:  Diagnosis Date   Allergy    Anxiety    Arthritis    L5 Back - no meds - otc prn   Asthma    Back pain    Constipation    Crohn's disease of ileum (HCC)    Fatty liver    GERD (gastroesophageal reflux disease)    High blood pressure    Hypertension    no meds x 3 yrs   IBS (irritable bowel syndrome)    Joint pain    Morbid obesity with BMI of 45.0-49.9, adult (HCC)    Recurrent upper respiratory infection (URI)    Sacral fracture (HCC)    Urticaria    Vitamin B 12 deficiency    Vitamin D  deficiency    Past Surgical History:  Procedure Laterality Date   ADENOIDECTOMY     COLONOSCOPY     DILATION AND EVACUATION  02/25/2012   Procedure: DILATATION AND EVACUATION;  Surgeon: Rosaline DELENA Luna, MD;  Location: [facility];  Service: Gynecology;  Laterality: N/A;   DILATION AND EVACUATION N/A 03/01/2013   Procedure: DILATATION AND EVACUATION;  Surgeon: Oneil FORBES Piety, MD;  Location: [facility];  Service: Gynecology;  Laterality: N/A;   TONSILLECTOMY     TONSILLECTOMY AND ADENOIDECTOMY  2007   Patient Active Problem List   Diagnosis Date Noted   Prediabetes 02/09/2023   Other fatigue 02/08/2023   SOB (shortness of breath) on exertion 02/08/2023   Vitamin D  deficiency 02/08/2023   Polyarthralgia 02/08/2023   Health care maintenance 02/08/2023   Elevated glucose 02/08/2023   Elevated blood pressure reading 02/08/2023   Class 2 severe obesity due to excess  calories with serious comorbidity in adult 02/08/2023   Plantar fasciitis 07/28/2018   Asthma 07/25/2018   BMI 45.0-49.9, adult (HCC) 12/13/2017   Long-term use of immunosuppressant medication - Cimzia  07/01/2017   Crohn's ileitis, with diarrhea (HCC) 12/10/2016   Gastroesophageal reflux disease 12/10/2016   Chronic hypertension complicating or reason for care during pregnancy    Pre-existing hypertension affecting pregnancy in second trimester    Other obesity 06/26/2013   Mild persistent asthma 11/14/2012    PCP: Merna Huxley, NP  REFERRING PROVIDER: Avram Lupita FORBES, MD   REFERRING DIAG: K59.00 (ICD-10-CM) - Constipation, unspecified constipation type  THERAPY DIAG:  Unspecified lack of coordination  Muscle weakness (generalized)  Abnormal posture  Lower abdominal pain  Other low back pain  Rationale for Evaluation and Treatment: Rehabilitation  ONSET DATE: chronic - as long as she can remember   SUBJECTIVE:  SUBJECTIVE STATEMENT: Pt states that she has to take something in order to have bowel movement. She states that she has had stomach issues for as long as she can remember. 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 She feels like her stress levels impact bowel movements.    PAIN:  Are you having pain? Yes NPRS scale: 7-8/10 low back pain, abdominal cramping 10/10 Pain location: low back/abdominal pain  Pain type: pressure and cramping Pain description: intermittent   Aggravating factors: constipation Relieving factors: bowel movements   PRECAUTIONS: None  RED FLAGS: None   WEIGHT BEARING RESTRICTIONS: No  FALLS:  Has patient fallen in last 6 months? 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    ACTIVITY LEVEL : she wis working on walking and going to the gym  PLOF: Independent  PATIENT GOALS: improve bowel movements   PERTINENT HISTORY:  D&C x 2, Chron's, GERD, asthma, back pain, IBS, sacral fracture and L5 Sexual abuse: No  BOWEL MOVEMENT: Pain with bowel movement: Yes - sometimes  Type of bowel movement:Type (Bristol Stool Scale) 4-7, Frequency 3x/week, Strain yes, and Splinting pushes on abdomen; not complete and can't get clean Fully empty rectum: No Leakage: Yes: cannot get clean after but does not leak at other times  Pads: Yes: panty liner Fiber supplement/laxative Yes - stool softener, miralax  URINATION: Pain with urination: No Fully empty bladder: No Stream: Strong Urgency: Yes  Frequency: 3x/day; occasionally 1xnight Fluid Intake: working on drinking more water - out on FMLA currently so getting more Leakage: none Pads: Yes: panty liner  INTERCOURSE:  Ability to have vaginal penetration Yes  Pain with intercourse: none DrynessNo Climax: harder to achieve  [place] Scale: 0/3 Lubricant: none   PREGNANCY: Vaginal deliveries 1 Tearing Yes:   Episiotomy No C-section deliveries 0 Currently pregnant No  PROLAPSE: None   OBJECTIVE:  Note: Objective measures were completed at Evaluation unless otherwise noted.  01/23/24 PATIENT SURVEYS:   PFIQ-7: 57 (bowel)  COGNITION: Overall cognitive status: Within functional limits for tasks assessed     SENSATION: Light touch: Appears intact   FUNCTIONAL TESTS:  Squat: use of hands on thighs for support Single leg stance:  Rt: pelvic drop  Lt: pelvic drop Curl-up test: abdominal distortion    GAIT: Assistive device utilized: None Comments: decreased trunk rotation and increased anterior pelvic tilt  POSTURE: rounded shoulders, forward head, increased lumbar lordosis, increased thoracic kyphosis, anterior pelvic tilt, and scoliosis with Rt thoracic curvature   LUMBARAROM/PROM:  A/PROM  A/PROM  Eval (% available)  Flexion 50 pain  Extension 25, pain  Right lateral flexion 25 pain  Left lateral flexion 25 pain  Right rotation 25 pain  Left rotation 25 paim   (Blank rows = not tested)  PALPATION:   General: tightness in lower thoracic and lumbar paraspinals, tenderness throughout low back, sacrum and coccyx  Pelvic Alignment: anterior pelvic tilt  Abdominal: tenderness throughout upper and lower, more notable lower abdominal tenderness and report of pressure; tightness throughout abdominals                 External Perineal Exam: WNL                             Internal Pelvic Floor: non-tender, anterior vaginal wall laxity palpable; external anal sphincter dyssynergia with paradoxical contraction and no appropriate increase in  intra-rectal pressure  Patient confirms identification and approves PT to assess internal pelvic floor and treatment Yes  PELVIC MMT:   MMT eval  Vaginal 3/5, 6 seconds, 7 repeat contractions  Internal Anal Sphincter 1/5  External Anal Sphincter 1/5  Puborectalis 1/5  Diastasis Recti 3 finger widths   (Blank rows = not tested)        TONE: WNL  PROLAPSE: Grade 2 anterior vaginal wall laxity. Grade 1 posterior vaginal wall laxity  TODAY'S TREATMENT:                                                                                                                              DATE:  01/23/24 EVAL  Therapeutic activities: Urge drill Use of squatty potty  Pushing mechanics - exhale like blowing up a tight balloon when trying to have bowel movement - imagery to help anus and pelvic floor muscles relax with pushing For all possible CPT codes, reference the Planned Interventions line above.     Check all conditions that are expected to impact treatment: {Conditions expected to impact treatment:None of these apply   If treatment provided at initial evaluation, no treatment charged due to lack of authorization.        PATIENT  EDUCATION:  Education details: See above Person educated: Patient Education method: Explanation, Demonstration, Tactile cues, Verbal cues, and Handouts Education comprehension: verbalized understanding  HOME EXERCISE PROGRAM: Written hand out   ASSESSMENT:  CLINICAL IMPRESSION: Patient is a 43 y.o. female who was seen today for physical therapy evaluation and treatment for chronic constipation, abdominal pain, low back pain, and urinary urgency. 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 Initial treatment consisted of urge drill, squatty potty, and appropriate pushing mechanics during bowel movements. She will continue to benefit from skilled PT intervention in order to improve constipation, decrease urinary urgency, decrease pain, address impairments, and improve quality of life.   OBJECTIVE IMPAIRMENTS: decreased activity tolerance, decreased coordination, decreased endurance, decreased mobility, decreased ROM, decreased strength, increased fascial restrictions, increased muscle spasms, impaired flexibility, impaired tone, improper body mechanics, postural dysfunction, and pain.   ACTIVITY LIMITATIONS: bending, standing, squatting, continence, and urinary urgency, constipation   PARTICIPATION LIMITATIONS: cleaning, laundry, driving, community activity, occupation, and exercise  PERSONAL FACTORS: 3+ comorbidities: medical history  are also affecting patient's functional outcome.   REHAB POTENTIAL: Good  CLINICAL DECISION MAKING: Evolving/moderate complexity  EVALUATION COMPLEXITY: Moderate   GOALS: Goals reviewed with patient? Yes  SHORT TERM GOALS: Target date: 02/20/2024  Pt will be independent with HEP in order to improve activity tolerance.   Baseline: Goal status: INITIAL  2.  Pt will increase external anal sphincter strength and coordination to 2/5 with appropriate contraction when she thinks she is squeezing in order to improve bowel evacuation and fecal smearing to help with personal hygiene.   Baseline: cannot empty and has smearing after bowel movements; 1/5 strength Goal status: INITIAL  3.  Pt will generate appropriate increase in intra-rectal pressure with pushing during bowel movements in order to completely empty to improve personal hygiene  Baseline: no generation of intra-rectal pressure with pushing Goal status: INITIAL  4.  Pt will increase all impaired lumbar A/ROM by 25% without pain in order to improve length tension relationship in pelvic floor muscles, improving bowel movements.   Baseline: flexion 50% and all others reduced by 75% Goal status: INITIAL  5.  Pt will be independent with use of squatty potty, relaxed toileting mechanics, and improved bowel movement techniques in order to increase ease of bowel movements and complete evacuation.   Baseline:  Goal status: INITIAL  6.  Pt will be able to teach back and utilize urge suppression technique in order to help reduce number of trips to the bathroom.    Baseline:  Goal status: INITIAL  LONG TERM GOALS: Target date: 07/09/2024  Pt will be independent with advanced HEP in order to improve activity tolerance.   Baseline:  Goal status: INITIAL  2.  Pt will improve PFIQ-7 score to less than 30 in order to demonstrate improved quality of life. Baseline: 57 Goal status: INITIAL  3.  Pt will have 5 or more bowel  movements a week without straining and complete emptying in order to improve low back and abdominal pain.  Baseline: 7-10/10 pain; 3x/week Goal status: INITIAL  4.  Patient will report 75% improvement in low back/abdominal pain in order to increase activity tolerance.   Baseline: 7-10/10 Goal status: INITIAL  5.  Pt will increase external anal sphincter strength and coordination to 2/5 with appropriate contraction when she thinks she is squeezing in order to improve bowel evacuation and fecal smearing to help with personal hygiene.   Baseline: 1/5 Goal status: INITIAL   PLAN:  PT FREQUENCY: 1-2x/week  PT DURATION: 12 visits    PLANNED INTERVENTIONS: 97164- PT Re-evaluation, 97110-Therapeutic exercises, 97530- Therapeutic activity, 97112- Neuromuscular re-education, [phone]- Self Care, 02859- Manual therapy, [phone]- Gait training, [phone]- Aquatic Therapy, [phone]- Electrical stimulation (unattended), [phone]- Traction (mechanical), [id number]- Ionotophoresis 4mg /ml Dexamethasone , 79439 (1-2 muscles), 20561 (3+ muscles)- Dry Needling, Patient/Family education, Balance training, Taping, Joint mobilization, Joint manipulation, Spinal manipulation, Spinal mobilization, Scar mobilization, Vestibular training, Cryotherapy, Moist heat, and Biofeedback  PLAN FOR NEXT SESSION: Review appropriate toilet mechanics; discuss water and fiber tracking (she needs to increase water but doesn't know how much she's drinking); teach self bowel mobilization techniques to improve motility; manual techniques to abdomen and low back/sacrum/coccyx; mobility/stretches; strengthening; pelvic floor muscle/external anal sphincter contraction training; pressure management  Josette Mares, PT, DPT10/13/2510:49 AM [facility] [address], [address] [place], [place] [address] Phone # [phone] Fax [phone]

## 2024-01-25 ENCOUNTER — Ambulatory Visit: Payer: Self-pay | Admitting: Adult Health

## 2024-01-25 NOTE — Telephone Encounter (Signed)
 FYI Only or Action Required?: FYI only for provider.  Patient was last seen in primary care on 01/19/2024 by Merna Huxley, NP.  Called Nurse Triage reporting Fall.  Symptoms began today.  Interventions attempted: OTC medications: Aleve and prescribed muscle relaxant.  Symptoms are: gradually worsening.  Triage Disposition: Go to ED Now (Notify PCP)  Patient/caregiver understands and will follow disposition?: Yes            Copied from CRM [id number]. Topic: Clinical - Red Word Triage >> Jan 25, 2024 11:36 AM [name] ORN wrote: Red Word that prompted transfer to Nurse Triage: fell down some steps at home this morning landed face first and took Aleve for pain and it isn't working. Reason for Disposition  Sounds like a serious injury to the triager  Answer Assessment - Initial Assessment Questions This RN recommends pt goes to ED and has another adult drive pt there. Pt agreeable.  Pt fell early this morning when walking down the steps. Pt missed one of the steps and fell face-first. Pt hit around her eyebrow not super hard. Pt states she fell awkwardly on her knees and stomach. Pt husband had to come from work to help pt get up. Pt states she had some dizziness while she was sitting there. Pt states she took a muscle relaxant and Aleve but the pain is not getting better  L eyebrow hit in fall R knee hurts really bad, L knee not as bad Hips are stiff The most pain is in pelvic area, sacrum, R big toe, R knee  Denies losing consciousness, open wounds  PAIN: Is there any pain? If Yes, ask: How bad is the pain? (e.g., Scale 0-10; or none, mild,      8/10 pain level  OTHER SYMPTOMS: Do you have any other symptoms? (e.g., dizziness, fever, weakness; new-onset or worsening).      Denies current dizziness  Protocols used: Falls and [employer]

## 2024-01-25 NOTE — Telephone Encounter (Signed)
 Pt has been scheduled.

## 2024-01-26 ENCOUNTER — Encounter: Payer: Self-pay | Admitting: Adult Health

## 2024-01-26 ENCOUNTER — Ambulatory Visit (INDEPENDENT_AMBULATORY_CARE_PROVIDER_SITE_OTHER)
Admission: RE | Admit: 2024-01-26 | Discharge: 2024-01-26 | Disposition: A | Discharge status: Home / Self Care | Source: Ambulatory Visit | Attending: Adult Health | Admitting: Adult Health

## 2024-01-26 ENCOUNTER — Ambulatory Visit: Admitting: Adult Health

## 2024-01-26 VITALS — BP 140/100 | HR 85 | Temp 98.4°F | Ht 69.0 in | Wt 324.0 lb

## 2024-01-26 DIAGNOSIS — M25551 Pain in right hip: Secondary | ICD-10-CM | POA: Diagnosis not present

## 2024-01-26 DIAGNOSIS — M545 Low back pain, unspecified: Secondary | ICD-10-CM

## 2024-01-26 DIAGNOSIS — M25561 Pain in right knee: Secondary | ICD-10-CM

## 2024-01-26 MED ORDER — HYDROCODONE-ACETAMINOPHEN 5-325 MG PO TABS: 1.0000 | ORAL_TABLET | Freq: Four times a day (QID) | ORAL | 0 refills | Status: DC | PRN

## 2024-01-26 NOTE — Progress Notes (Signed)
 Subjective:    Patient ID: Sydney Bradley, female    DOB: 15-Dec-1980, 43 y.o.   MRN: [id number]  Fall    Discussed the use of AI scribe software for clinical note transcription with the patient, who gave verbal consent to proceed.  History of Present Illness   Sydney Bradley is a 43 year old female who presents with pain and soreness following a fall down the stairs.  She fell at approximately 4:00 AM yesterday missing a step while descending the stairs. A laundry basket cushioned the fall, preventing a more severe impact. She experienced significant pain in the right knee, right hip, and pelvic area, with the right knee and hip being particularly painful when bending or under pressure. There is soreness in the left knee and muscles, likely due to compensating for the right side.  She experienced dizziness and spinning while lying on the floor, which has since resolved. There are no current headaches or blurred vision. She has pre-existing vision issues in her right eye and is under specialist care.  She uses Aleve and Tylenol  Arthritis for pain management, which provides some relief.       Review of Systems See HPI   Past Medical History:  Diagnosis Date   Allergy    Anxiety    Arthritis    L5 Back - no meds - otc prn   Asthma    Back pain    Constipation    Crohn's disease of ileum (HCC)    Fatty liver    GERD (gastroesophageal reflux disease)    High blood pressure    Hypertension    no meds x 3 yrs   IBS (irritable bowel syndrome)    Joint pain    Morbid obesity with BMI of 45.0-49.9, adult (HCC)    Recurrent upper respiratory infection (URI)    Sacral fracture (HCC)    Urticaria    Vitamin B 12 deficiency    Vitamin D  deficiency     Social History   Socioeconomic History   Marital status: Married    Spouse name: Not on file   Number of children: 1   Years of education: 18   Highest education level: Master's degree (e.g., MA, MS, MEng, MEd, MSW, MBA)  Occupational  History   Occupation: Child psychotherapist    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[place], foster care   Married    One biologic son Judeth) born 2016 and a stepson   One caffeinated beverage daily   Social Drivers of Health   Financial Resource Strain: Low Risk  (10/07/2021)   Overall Financial Resource Strain (CARDIA)    Difficulty of Paying Living Expenses: Not very hard  Food Insecurity: No Food Insecurity (10/07/2021)   Hunger Vital Sign    Worried About Running Out of Food in the Last Year: Never true    Ran Out of Food in the Last Year: Never true  Transportation Needs: No Transportation Needs (10/07/2021)   PRAPARE -  Administrator, Civil Service (Medical): No    Lack of Transportation (Non-Medical): No  Physical Activity: Unknown (10/07/2021)   Exercise Vital Sign    Days of Exercise per Week: 0 days    Minutes of Exercise per Session: Not on file  Stress: Stress Concern Present (10/07/2021)   [employer] of Occupational Health - Occupational Stress Questionnaire    Feeling of Stress : To some extent  Social Connections: Unknown (02/12/2023)   Received from [facility]   Social Network    Social Network: Not on file  Intimate Partner Violence: Not At Risk (02/12/2023)   Received from [facility]   HITS    Over the last 12 months how often did your partner physically hurt you?: Never    Over the last 12 months how often did your partner insult you or talk down to you?: Never    Over the last 12 months how often did your partner  threaten you with physical harm?: Never    Over the last 12 months how often did your partner scream or curse at you?: Never    Past Surgical History:  Procedure Laterality Date   ADENOIDECTOMY     COLONOSCOPY     DILATION AND EVACUATION  02/25/2012   Procedure: DILATATION AND EVACUATION;  Surgeon: Rosaline DELENA Luna, MD;  Location: [facility];  Service: Gynecology;  Laterality: N/A;   DILATION AND EVACUATION N/A 03/01/2013   Procedure: DILATATION AND EVACUATION;  Surgeon: Oneil FORBES Piety, MD;  Location: [facility];  Service: Gynecology;  Laterality: N/A;   TONSILLECTOMY     TONSILLECTOMY AND ADENOIDECTOMY  2007    Family History  Problem Relation Age of Onset   Depression Mother    Obesity Mother    Colon cancer Father 21       mets to liver and bone died at 57   Anxiety disorder Father    Bipolar disorder Father    Drug abuse Father    Obesity Father    Angioedema Sister    COPD Maternal Grandmother    Dementia Maternal Grandmother    Alzheimer's disease Maternal Grandfather    Cancer Paternal Grandmother        blood cancer?   Stroke Paternal Grandmother    Eczema Son    Asthma Son    Allergic rhinitis Son    Stomach cancer Neg Hx    Esophageal cancer Neg Hx     Allergies  Allergen Reactions   Ciprofloxacin Hives   Lamisil [Terbinafine] Hives   Latex Hives    Breathing problems   Other Anaphylaxis and Itching    Nuts Tree nuts   Peanut-Containing Drug Products Anaphylaxis    Allergy to all nuts    Shellfish Allergy Anaphylaxis   Ciprofibrate Hives   Penicillins Hives    Has patient had a PCN reaction causing immediate rash, facial/tongue/throat swelling, SOB or lightheadedness with hypotension: Yes Has patient had a PCN reaction causing severe rash involving mucus membranes or skin necrosis: No Has patient had a PCN reaction that required hospitalization No Has patient had a PCN reaction occurring within the last 10 years: No If all of the above answers are NO,  then may proceed with Cephalosporin use.    Chocolate Itching   Wheat Itching    Current Outpatient Medications on File Prior to Visit  Medication Sig Dispense Refill   albuterol  (PROVENTIL ) (2.5 MG/3ML) 0.083% nebulizer solution Take 3 mLs (2.5 mg total) by nebulization  every 4 (four) hours as needed for wheezing or shortness of breath. 75 mL 0   albuterol  (VENTOLIN  HFA) 108 (90 Base) MCG/ACT inhaler Inhale 2 puffs into the lungs every 4 (four) hours as needed for wheezing or shortness of breath. 18 g 1   buPROPion  (WELLBUTRIN  XL) 300 MG 24 hr tablet TAKE ONE TABLET BY MOUTH ONCE A DAY 90 tablet 0   clotrimazole -betamethasone  (LOTRISONE ) cream Apply topically.     diphenhydrAMINE  (BENADRYL ) 25 MG tablet Take 25 mg by mouth as needed for allergies.      EPINEPHrine  0.3 mg/0.3 mL IJ SOAJ injection Inject 0.3 mg into the muscle as needed for anaphylaxis. 2 each 1   famotidine  (PEPCID ) 20 MG tablet Take 1 tablet (20 mg total) by mouth 2 (two) times daily. 60 tablet 5   fluticasone  (FLONASE ) 50 MCG/ACT nasal spray Place 2 sprays into both nostrils daily. 16 g 6   levocetirizine (XYZAL ) 5 MG tablet Take up to 2 tablets twice daily by mouth as needed for hives. 120 tablet 5   metFORMIN  (GLUCOPHAGE -XR) 500 MG 24 hr tablet Take two tablets with dinner 60 tablet 0   montelukast  (SINGULAIR ) 10 MG tablet Take 1 tablet (10 mg total) by mouth at bedtime. 30 tablet 5   Olopatadine  HCl (PATADAY ) 0.2 % SOLN 1 drop each eye daily as needed for itchy/watery eyes. 2.5 mL 3   omeprazole  (PRILOSEC) 40 MG capsule TAKE ONE CAPSULE BY MOUTH ONCE A DAY BEFORE BREAKFAST 90 capsule 3   sertraline  (ZOLOFT ) 50 MG tablet Take 1 tablet (50 mg total) by mouth daily. 30 tablet 3   topiramate  (TOPAMAX ) 100 MG tablet Take 1 tablet (100 mg total) by mouth daily before supper. 30 tablet 0   traZODone  (DESYREL ) 50 MG tablet TAKE ONE OR TWO TABLETS BY MOUTH AT BEDTIME AS NEEDED FOR SLEEP 60 tablet 6   Vitamin D , Ergocalciferol ,  (DRISDOL ) 1.25 MG (50000 UNIT) CAPS capsule Take 1 capsule (50,000 Units total) by mouth every 7 (seven) days. 5 capsule 0   Current Facility-Administered Medications on File Prior to Visit  Medication Dose Route Frequency Provider Last Rate Last Admin   omalizumab  (XOLAIR ) prefilled syringe 300 mg  300 mg Subcutaneous Q28 days Jeneal Danita Macintosh, MD   300 mg at 02/14/23 1629    BP (!) 140/100   Pulse 85   Temp 98.4 F (36.9 C) (Oral)   Ht 5' 9 (1.753 m)   Wt (!) 324 lb (147 kg)   LMP 01/01/2024 (Exact Date)   SpO2 97%   BMI 47.85 kg/m       Objective:   Physical Exam Vitals and nursing note reviewed.  Constitutional:      Appearance: Normal appearance. She is obese.  Cardiovascular:     Rate and Rhythm: Normal rate and regular rhythm.     Pulses: Normal pulses.     Heart sounds: Normal heart sounds.  Pulmonary:     Effort: Pulmonary effort is normal.     Breath sounds: Normal breath sounds.  Musculoskeletal:        General: Tenderness present. No swelling.     Lumbar back: Tenderness and bony tenderness present. No swelling. Decreased range of motion.     Right hip: Tenderness present. No crepitus. Decreased range of motion. Normal strength.     Right knee: Bony tenderness present. No swelling, deformity or crepitus. Decreased range of motion. Tenderness present over the medial joint line. Normal alignment, normal meniscus and normal patellar  mobility. Normal pulse.     Comments: She was able to get on exam table but had significant pain with doing so   Neurological:     General: No focal deficit present.     Mental Status: She is alert and oriented to person, place, and time.     Gait: Gait abnormal (limping gait).  Psychiatric:        Mood and Affect: Mood normal.        Behavior: Behavior normal.        Thought Content: Thought content normal.        Judgment: Judgment normal.       Assessment & Plan:  1. Acute pain of right knee (Primary) - Will order  xrays of right knee, right hip and low back. She does appear in significant pain so will provide a short course of Norco. Advised not to take with Trazodone  at night  - May need advanced imaging of right knee if not improving in the coming weeks  - DG Knee 1-2 Views Right; Future - HYDROcodone -acetaminophen  (NORCO/VICODIN) 5-325 MG tablet; Take 1 tablet by mouth every 6 (six) hours as needed for moderate pain (pain score 4-6).  Dispense: 10 tablet; Refill: 0  2. Right hip pain  - DG Hip Unilat W OR W/O Pelvis 2-3 Views Right; Future - HYDROcodone -acetaminophen  (NORCO/VICODIN) 5-325 MG tablet; Take 1 tablet by mouth every 6 (six) hours as needed for moderate pain (pain score 4-6).  Dispense: 10 tablet; Refill: 0  3. Acute midline low back pain without sciatica  - DG Lumbar Spine Complete; Future  Darleene Shape, NP  I personally spent a total of 30 minutes in the care of the patient today including preparing to see the patient, getting/reviewing separately obtained history, performing a medically appropriate exam/evaluation, placing orders, and documenting clinical information in the EHR.

## 2024-01-27 ENCOUNTER — Ambulatory Visit: Payer: Self-pay | Admitting: Adult Health

## 2024-01-30 ENCOUNTER — Ambulatory Visit: Admitting: Physical Therapy

## 2024-01-30 DIAGNOSIS — R279 Unspecified lack of coordination: Secondary | ICD-10-CM

## 2024-01-30 DIAGNOSIS — R1030 Lower abdominal pain, unspecified: Secondary | ICD-10-CM

## 2024-01-30 DIAGNOSIS — R293 Abnormal posture: Secondary | ICD-10-CM

## 2024-01-30 DIAGNOSIS — M6281 Muscle weakness (generalized): Secondary | ICD-10-CM

## 2024-01-30 DIAGNOSIS — M5459 Other low back pain: Secondary | ICD-10-CM

## 2024-01-30 NOTE — Therapy (Signed)
 OUTPATIENT PHYSICAL THERAPY FEMALE PELVIC EVALUATION   Patient Name: Sydney Bradley MRN: [id number] DOB:21-Jun-1980, 43 y.o., female Today's Date: 01/30/2024  END OF SESSION:  PT End of Session - 01/30/24 1103     Visit Number 2    Date for Recertification  07/09/24    Authorization Type [employer]    PT Start Time 1101    PT Stop Time 1142    PT Time Calculation (min) 41 min    Activity Tolerance Patient tolerated treatment well    Behavior During Therapy WFL for tasks assessed/performed          Past Medical History:  Diagnosis Date   Allergy    Anxiety    Arthritis    L5 Back - no meds - otc prn   Asthma    Back pain    Constipation    Crohn's disease of ileum (HCC)    Fatty liver    GERD (gastroesophageal reflux disease)    High blood pressure    Hypertension    no meds x 3 yrs   IBS (irritable bowel syndrome)    Joint pain    Morbid obesity with BMI of 45.0-49.9, adult (HCC)    Recurrent upper respiratory infection (URI)    Sacral fracture (HCC)    Urticaria    Vitamin B 12 deficiency    Vitamin D  deficiency    Past Surgical History:  Procedure Laterality Date   ADENOIDECTOMY     COLONOSCOPY     DILATION AND EVACUATION  02/25/2012   Procedure: DILATATION AND EVACUATION;  Surgeon: Rosaline DELENA Luna, MD;  Location: [facility];  Service: Gynecology;  Laterality: N/A;   DILATION AND EVACUATION N/A 03/01/2013   Procedure: DILATATION AND EVACUATION;  Surgeon: Oneil FORBES Piety, MD;  Location: [facility];  Service: Gynecology;  Laterality: N/A;   TONSILLECTOMY     TONSILLECTOMY AND ADENOIDECTOMY  2007   Patient Active Problem List   Diagnosis Date Noted   Prediabetes 02/09/2023   Other fatigue 02/08/2023   SOB (shortness of breath) on exertion 02/08/2023   Vitamin D  deficiency 02/08/2023   Polyarthralgia 02/08/2023   Health care maintenance 02/08/2023   Elevated glucose 02/08/2023   Elevated blood pressure reading 02/08/2023   Class 2 severe obesity due to excess  calories with serious comorbidity in adult 02/08/2023   Plantar fasciitis 07/28/2018   Asthma 07/25/2018   BMI 45.0-49.9, adult (HCC) 12/13/2017   Long-term use of immunosuppressant medication - Cimzia  07/01/2017   Crohn's ileitis, with diarrhea (HCC) 12/10/2016   Gastroesophageal reflux disease 12/10/2016   Chronic hypertension complicating or reason for care during pregnancy    Pre-existing hypertension affecting pregnancy in second trimester    Other obesity 06/26/2013   Mild persistent asthma 11/14/2012    PCP: Merna Huxley, NP  REFERRING PROVIDER: Avram Lupita FORBES, MD   REFERRING DIAG: K59.00 (ICD-10-CM) - Constipation, unspecified constipation type  THERAPY DIAG:  Unspecified lack of coordination  Muscle weakness (generalized)  Abnormal posture  Lower abdominal pain  Other low back pain  Rationale for Evaluation and Treatment: Rehabilitation  ONSET DATE: chronic - as long as she can remember   SUBJECTIVE:  SUBJECTIVE STATEMENT: Clemens down her stairs at 4 in the morning and has been having a  lot pain, was assessed and nothing is broken but is very sore. Today is able to drive and walk much easier but is needing to sit instead of standing for longer for cooking or singing in church. Both knees are feeling better. Did have x-rays of spine and both hips and bil knees and no fractures and arthritis in Right hip and Grade 1 anterolisthesis of L5 on S1 with facet hypertrophy and degenerative disc disease noted per chart review 01/26/24  Having loose stools now but has been eating more activa and protein and needed pain medication after fall. And is increased water. Has been using squatty potty and has been having large stools now and feels empty. Started type 1 and now 5-7. But fully emptying.   Urge drill has been helping, has had greatly less urinary urgency   PAIN:  Are you having pain? Yes NPRS scale: 5/10 low back pain,  Pain location: low back/abdominal pain  Pain type: pressure and cramping Pain description: intermittent   Aggravating factors: constipation Relieving factors: bowel movements   PRECAUTIONS: None  RED FLAGS: None   WEIGHT BEARING RESTRICTIONS: No  FALLS:  Has patient fallen in last 6 months? 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   ACTIVITY LEVEL : she wis working on walking and going to the gym  PLOF: Independent  PATIENT GOALS: improve bowel movements   PERTINENT HISTORY:  D&C x 2, Chron's, GERD, asthma, back pain, IBS, sacral fracture and L5 Sexual abuse: No  BOWEL MOVEMENT: Pain with bowel movement: Yes - sometimes  Type of bowel movement:Type (Bristol Stool Scale) 4-7, Frequency 3x/week, Strain yes, and Splinting pushes on abdomen; not complete and can't get clean Fully empty rectum: No Leakage: Yes: cannot get clean after but does not leak at other times  Pads: Yes: panty liner Fiber supplement/laxative Yes - stool softener, miralax  URINATION: Pain with urination: No Fully empty bladder: No Stream: Strong Urgency: Yes  Frequency: 3x/day; occasionally 1xnight Fluid Intake: working on drinking more water - out on FMLA currently so getting more Leakage: none Pads: Yes: panty liner  INTERCOURSE:  Ability to have vaginal penetration Yes  Pain with intercourse: none DrynessNo Climax: harder to achieve  [place] [employer] Scale: 0/3 Lubricant: none   PREGNANCY: Vaginal deliveries 1 Tearing Yes:   Episiotomy No C-section deliveries 0 Currently pregnant No  PROLAPSE: None   OBJECTIVE:  Note: Objective measures were completed at Evaluation unless otherwise noted.  01/23/24 PATIENT SURVEYS:   PFIQ-7: 57 (bowel)  COGNITION: Overall cognitive status: Within functional limits for tasks  assessed     SENSATION: Light touch: Appears intact   FUNCTIONAL TESTS:  Squat: use of hands on thighs for support Single leg stance:  Rt: pelvic drop  Lt: pelvic drop Curl-up test: abdominal distortion    GAIT: Assistive device utilized: None Comments: decreased trunk rotation and increased anterior pelvic tilt  POSTURE: rounded shoulders, forward head, increased lumbar lordosis, increased thoracic kyphosis, anterior pelvic tilt, and scoliosis with Rt thoracic curvature   LUMBARAROM/PROM:  A/PROM A/PROM  Eval (% available)  Flexion 50 pain  Extension 25, pain  Right lateral flexion 25 pain  Left lateral flexion 25 pain  Right rotation 25 pain  Left rotation 25 paim   (Blank rows = not tested)  PALPATION:   General: tightness in lower thoracic and lumbar paraspinals, tenderness throughout low back, sacrum and coccyx  Pelvic Alignment: anterior pelvic tilt  Abdominal: tenderness throughout upper and lower, more notable lower abdominal tenderness and report of pressure; tightness throughout abdominals                 External Perineal Exam: WNL                             Internal Pelvic Floor: non-tender, anterior vaginal wall laxity palpable; external anal sphincter dyssynergia with paradoxical contraction and no appropriate increase in intra-rectal pressure  Patient confirms identification and approves PT to assess internal pelvic floor and treatment Yes  PELVIC MMT:   MMT eval  Vaginal 3/5, 6 seconds, 7 repeat contractions  Internal Anal Sphincter 1/5  External Anal Sphincter 1/5  Puborectalis 1/5  Diastasis Recti 3 finger widths   (Blank rows = not tested)        TONE: WNL  PROLAPSE: Grade 2 anterior vaginal wall laxity. Grade 1 posterior vaginal wall laxity  TODAY'S TREATMENT:                                                                                                                              DATE:  01/23/24 EVAL  Therapeutic  activities: Urge drill Use of squatty potty  Pushing mechanics - exhale like blowing up a tight balloon when trying to have bowel movement - imagery to help anus and pelvic floor muscles relax with pushing For all possible CPT codes, reference the Planned Interventions line above.     Check all conditions that are expected to impact treatment: {Conditions expected to impact treatment:None of these apply   If treatment provided at initial evaluation, no treatment charged due to lack of authorization.     01/30/24: Moist heat pack at low back with x4 layers between skin and pack - no adverse reactions post removal. 10 mins in place during beginning exercises.  Lumbar rotations x15 Butterfly stretch 2x30s Single knee to chest 3x30s with strap Piriformis stretch 3x30s with strap Cat/cow x10    PATIENT EDUCATION:  Education details: See above Person educated: Patient Education method: Explanation, Demonstration, Tactile cues, Verbal cues, and Handouts Education comprehension: verbalized understanding  HOME EXERCISE PROGRAM: Written hand out   ASSESSMENT:  CLINICAL IMPRESSION: Patient is a 43 y.o. female who was seen today for physical therapy treatment for chronic constipation, abdominal pain, low back pain, and urinary urgency. Pt has seen improvement since eval  but did have a fall and has been having back and knee pain limiting mobility overall. But starting to feel better, had no pain at end of session. She will continue to benefit from skilled PT intervention in order to improve constipation, decrease urinary urgency, decrease pain, address impairments, and improve quality of life.   OBJECTIVE IMPAIRMENTS: decreased activity tolerance, decreased coordination, decreased endurance, decreased mobility, decreased ROM, decreased strength, increased fascial restrictions, increased muscle spasms, impaired flexibility, impaired tone, improper body  mechanics, postural dysfunction, and pain.    ACTIVITY LIMITATIONS: bending, standing, squatting, continence, and urinary urgency, constipation   PARTICIPATION LIMITATIONS: cleaning, laundry, driving, community activity, occupation, and exercise  PERSONAL FACTORS: 3+ comorbidities: medical history are also affecting patient's functional outcome.   REHAB POTENTIAL: Good  CLINICAL DECISION MAKING: Evolving/moderate complexity  EVALUATION COMPLEXITY: Moderate   GOALS: Goals reviewed with patient? Yes  SHORT TERM GOALS: Target date: 02/20/2024   Pt will be independent with HEP in order to improve activity tolerance.   Baseline: Goal status: INITIAL  2.  Pt will increase external anal sphincter strength and coordination to 2/5 with appropriate contraction when she thinks she is squeezing in order to improve bowel evacuation and fecal smearing to help with personal hygiene.   Baseline: cannot empty and has smearing after bowel movements; 1/5 strength Goal status: INITIAL  3.  Pt will generate appropriate increase in intra-rectal pressure with pushing during bowel movements in order to completely empty to improve personal hygiene  Baseline: no generation of intra-rectal pressure with pushing Goal status: INITIAL  4.  Pt will increase all impaired lumbar A/ROM by 25% without pain in order to improve length tension relationship in pelvic floor muscles, improving bowel movements.   Baseline: flexion 50% and all others reduced by 75% Goal status: INITIAL  5.  Pt will be independent with use of squatty potty, relaxed toileting mechanics, and improved bowel movement techniques in order to increase ease of bowel movements and complete evacuation.   Baseline:  Goal status: INITIAL  6.  Pt will be able to teach back and utilize urge suppression technique in order to help reduce number of trips to the bathroom.    Baseline:  Goal status: INITIAL  LONG TERM GOALS: Target date: 07/09/2024  Pt will be independent with advanced  HEP in order to improve activity tolerance.   Baseline:  Goal status: INITIAL  2.  Pt will improve PFIQ-7 score to less than 30 in order to demonstrate improved quality of life. Baseline: 57 Goal status: INITIAL  3.  Pt will have 5 or more bowel movements a week without straining and complete emptying in order to improve low back and abdominal pain.  Baseline: 7-10/10 pain; 3x/week Goal status: INITIAL  4.  Patient will report 75% improvement in low back/abdominal pain in order to increase activity tolerance.   Baseline: 7-10/10 Goal status: INITIAL  5.  Pt will increase external anal sphincter strength and coordination to 2/5 with appropriate contraction when she thinks she is squeezing in order to improve bowel evacuation and fecal smearing to help with personal hygiene.   Baseline: 1/5 Goal status: INITIAL   PLAN:  PT FREQUENCY: 1-2x/week  PT DURATION: 12 visits    PLANNED INTERVENTIONS: 97164- PT Re-evaluation, 97110-Therapeutic exercises, 97530- Therapeutic activity, 97112- Neuromuscular re-education, [id number]- Self Care, 02859- Manual therapy, [phone]- Gait training, [phone]- Aquatic Therapy, [phone]- Electrical stimulation (unattended), [phone]- Traction (mechanical), [id number]- Ionotophoresis 4mg /ml Dexamethasone , 79439 (1-2 muscles), [id number] (3+ muscles)- Dry Needling, Patient/Family education, Balance training, Taping, Joint mobilization, Joint manipulation, Spinal manipulation, Spinal mobilization, Scar mobilization, Vestibular training, Cryotherapy, Moist heat, and Biofeedback  PLAN FOR NEXT SESSION: Review appropriate toilet mechanics; discuss water and fiber tracking (she needs to increase water but doesn't know how much she's drinking); teach self bowel mobilization techniques to improve motility; manual techniques to abdomen and low back/sacrum/coccyx; mobility/stretches; strengthening; pelvic floor muscle/external anal sphincter contraction training; pressure management  Darryle Navy, PT, DPT 01/30/2511:36 PM  [facility] [address], [address] [place], [place] [address] Phone # [phone] Fax [phone]

## 2024-02-03 ENCOUNTER — Ambulatory Visit: Admitting: Adult Health

## 2024-02-03 ENCOUNTER — Encounter: Payer: Self-pay | Admitting: Adult Health

## 2024-02-03 VITALS — BP 120/80 | HR 86 | Temp 98.5°F | Ht 69.0 in | Wt 324.0 lb

## 2024-02-03 DIAGNOSIS — F419 Anxiety disorder, unspecified: Secondary | ICD-10-CM

## 2024-02-03 DIAGNOSIS — F331 Major depressive disorder, recurrent, moderate: Secondary | ICD-10-CM | POA: Diagnosis not present

## 2024-02-03 DIAGNOSIS — M545 Low back pain, unspecified: Secondary | ICD-10-CM

## 2024-02-03 DIAGNOSIS — R519 Headache, unspecified: Secondary | ICD-10-CM

## 2024-02-03 DIAGNOSIS — M25561 Pain in right knee: Secondary | ICD-10-CM | POA: Diagnosis not present

## 2024-02-03 MED ORDER — METHYLPREDNISOLONE ACETATE 80 MG/ML IJ SUSP
80.0000 mg | Freq: Once | INTRAMUSCULAR | Status: AC
  Administered 2024-02-03: 80 mg via INTRAMUSCULAR

## 2024-02-03 MED ORDER — KETOROLAC TROMETHAMINE 60 MG/2ML IM SOLN
60.0000 mg | Freq: Once | INTRAMUSCULAR | Status: AC
  Administered 2024-02-03: 60 mg via INTRAMUSCULAR

## 2024-02-03 NOTE — Patient Instructions (Signed)
 Health Maintenance Due  Topic Date Due   Hepatitis C Screening  Never done   Pneumococcal Vaccine (1 of 2 - PCV) Never done   Hepatitis B Vaccines 19-59 Average Risk (1 of 3 - 19+ 3-dose series) Never done   HPV VACCINES (1 - 3-dose SCDM series) Never done   Mammogram  Never done   Cervical Cancer Screening (HPV/Pap Cotest)  07/13/2019   Influenza Vaccine  Never done   COVID-19 Vaccine (3 - 2025-26 season) 12/12/2023       02/03/2024    9:56 AM 01/05/2024   10:17 AM 10/07/2021    3:46 PM  Depression screen PHQ 2/9  Decreased Interest 2 1 0  Down, Depressed, Hopeless 2 2 0  PHQ - 2 Score 4 3 0  Altered sleeping 2 2   Tired, decreased energy 2 3   Change in appetite 2 3   Feeling bad or failure about yourself  2 3   Trouble concentrating 2 3   Moving slowly or fidgety/restless 0 0   Suicidal thoughts 0 0   PHQ-9 Score 14 17   Difficult doing work/chores Somewhat difficult Extremely dIfficult
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
## 2024-02-08 ENCOUNTER — Ambulatory Visit: Admitting: Physical Therapy

## 2024-02-08 DIAGNOSIS — M6281 Muscle weakness (generalized): Secondary | ICD-10-CM

## 2024-02-08 DIAGNOSIS — R293 Abnormal posture: Secondary | ICD-10-CM

## 2024-02-08 DIAGNOSIS — R279 Unspecified lack of coordination: Secondary | ICD-10-CM

## 2024-02-08 NOTE — Therapy (Signed)
 OUTPATIENT PHYSICAL THERAPY FEMALE PELVIC TREATMENT   Patient Name: Sydney Bradley MRN: [id number] DOB:07/27/1980, 43 y.o., female Today's Date: 02/08/2024  END OF SESSION:  PT End of Session - 02/08/24 1248     Visit Number 3    Date for Recertification  07/09/24    Authorization Type [employer]    PT Start Time 1146    PT Stop Time 1228    PT Time Calculation (min) 42 min    Activity Tolerance Patient tolerated treatment well    Behavior During Therapy WFL for tasks assessed/performed           Past Medical History:  Diagnosis Date   Allergy    Anxiety    Arthritis    L5 Back - no meds - otc prn   Asthma    Back pain    Constipation    Crohn's disease of ileum (HCC)    Fatty liver    GERD (gastroesophageal reflux disease)    High blood pressure    Hypertension    no meds x 3 yrs   IBS (irritable bowel syndrome)    Joint pain    Morbid obesity with BMI of 45.0-49.9, adult (HCC)    Recurrent upper respiratory infection (URI)    Sacral fracture (HCC)    Urticaria    Vitamin B 12 deficiency    Vitamin D  deficiency    Past Surgical History:  Procedure Laterality Date   ADENOIDECTOMY     COLONOSCOPY     DILATION AND EVACUATION  02/25/2012   Procedure: DILATATION AND EVACUATION;  Surgeon: Rosaline DELENA Luna, MD;  Location: [facility];  Service: Gynecology;  Laterality: N/A;   DILATION AND EVACUATION N/A 03/01/2013   Procedure: DILATATION AND EVACUATION;  Surgeon: Oneil FORBES Piety, MD;  Location: [facility];  Service: Gynecology;  Laterality: N/A;   TONSILLECTOMY     TONSILLECTOMY AND ADENOIDECTOMY  2007   Patient Active Problem List   Diagnosis Date Noted   Prediabetes 02/09/2023   Other fatigue 02/08/2023   SOB (shortness of breath) on exertion 02/08/2023   Vitamin D  deficiency 02/08/2023   Polyarthralgia 02/08/2023   Health care maintenance 02/08/2023   Elevated glucose 02/08/2023   Elevated blood pressure reading 02/08/2023   Class 2 severe obesity due to  excess calories with serious comorbidity in adult 02/08/2023   Plantar fasciitis 07/28/2018   Asthma 07/25/2018   BMI 45.0-49.9, adult (HCC) 12/13/2017   Long-term use of immunosuppressant medication - Cimzia  07/01/2017   Crohn's ileitis, with diarrhea (HCC) 12/10/2016   Gastroesophageal reflux disease 12/10/2016   Chronic hypertension complicating or reason for care during pregnancy    Pre-existing hypertension affecting pregnancy in second trimester    Other obesity 06/26/2013   Mild persistent asthma 11/14/2012    PCP: Merna Huxley, NP  REFERRING PROVIDER: Avram Lupita FORBES, MD   REFERRING DIAG: K59.00 (ICD-10-CM) - Constipation, unspecified constipation type  THERAPY DIAG:  Unspecified lack of coordination  Muscle weakness (generalized)  Abnormal posture  Rationale for Evaluation and Treatment: Rehabilitation  ONSET DATE: chronic - as long as she can remember   SUBJECTIVE:  SUBJECTIVE STATEMENT: Back is feeling much better now.  Was out of town and ate out a lot so this changed her bowels more with having more type 7, no struggles emptying but since being home has been drinking more water and eating better had formed stool today (first time since Saturday).  Urine has been much better, no urgency or leakage.    PAIN:  Are you having pain? Yes NPRS scale: 1/10 low back pain,  Pain location: low back/abdominal pain  Pain type: pressure and cramping Pain description: intermittent   Aggravating factors: constipation Relieving factors: bowel movements   PRECAUTIONS: None  RED FLAGS: None   WEIGHT BEARING RESTRICTIONS: No  FALLS:  Has patient fallen in last 6 months? 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   ACTIVITY LEVEL : she wis working on walking and going  to the gym  PLOF: Independent  PATIENT GOALS: improve bowel movements   PERTINENT HISTORY:  D&C x 2, Chron's, GERD, asthma, back pain, IBS, sacral fracture and L5 Sexual abuse: No  BOWEL MOVEMENT: Pain with bowel movement: Yes - sometimes  Type of bowel movement:Type (Bristol Stool Scale) 4-7, Frequency 3x/week, Strain yes, and Splinting pushes on abdomen; not complete and can't get clean Fully empty rectum: No Leakage: Yes: cannot get clean after but does not leak at other times  Pads: Yes: panty liner Fiber supplement/laxative Yes - stool softener, miralax  URINATION: Pain with urination: No Fully empty bladder: No Stream: Strong Urgency: Yes  Frequency: 3x/day; occasionally 1xnight Fluid Intake: working on drinking more water - out on FMLA currently so getting more Leakage: none Pads: Yes: panty liner  INTERCOURSE:  Ability to have vaginal penetration Yes  Pain with intercourse: none DrynessNo Climax: harder to achieve  [place] [employer] Scale: 0/3 Lubricant: none   PREGNANCY: Vaginal deliveries 1 Tearing Yes:   Episiotomy No C-section deliveries 0 Currently pregnant No  PROLAPSE: None   OBJECTIVE:  Note: Objective measures were completed at Evaluation unless otherwise noted.  01/23/24 PATIENT SURVEYS:   PFIQ-7: 57 (bowel)  COGNITION: Overall cognitive status: Within functional limits for tasks assessed     SENSATION: Light touch: Appears intact   FUNCTIONAL TESTS:  Squat: use of hands on thighs for support Single leg stance:  Rt: pelvic drop  Lt: pelvic drop Curl-up test: abdominal distortion    GAIT: Assistive device utilized: None Comments: decreased trunk rotation and increased anterior pelvic tilt  POSTURE: rounded shoulders, forward head, increased lumbar lordosis, increased thoracic kyphosis, anterior pelvic tilt, and scoliosis with Rt thoracic curvature   LUMBARAROM/PROM:  A/PROM A/PROM  Eval (% available)  Flexion 50 pain  Extension  25, pain  Right lateral flexion 25 pain  Left lateral flexion 25 pain  Right rotation 25 pain  Left rotation 25 paim   (Blank rows = not tested)  PALPATION:   General: tightness in lower thoracic and lumbar paraspinals, tenderness throughout low back, sacrum and coccyx  Pelvic Alignment: anterior pelvic tilt  Abdominal: tenderness throughout upper and lower, more notable lower abdominal tenderness and report of pressure; tightness throughout abdominals                 External Perineal Exam: WNL                             Internal Pelvic Floor: non-tender, anterior vaginal wall laxity palpable; external anal sphincter dyssynergia with paradoxical contraction and no appropriate increase in  intra-rectal pressure  Patient confirms identification and approves PT to assess internal pelvic floor and treatment Yes  PELVIC MMT:   MMT eval  Vaginal 3/5, 6 seconds, 7 repeat contractions  Internal Anal Sphincter 1/5  External Anal Sphincter 1/5  Puborectalis 1/5  Diastasis Recti 3 finger widths   (Blank rows = not tested)        TONE: WNL  PROLAPSE: Grade 2 anterior vaginal wall laxity. Grade 1 posterior vaginal wall laxity  TODAY'S TREATMENT:                                                                                                                              DATE:  01/23/24 EVAL  Therapeutic activities: Urge drill Use of squatty potty  Pushing mechanics - exhale like blowing up a tight balloon when trying to have bowel movement - imagery to help anus and pelvic floor muscles relax with pushing For all possible CPT codes, reference the Planned Interventions line above.     Check all conditions that are expected to impact treatment: {Conditions expected to impact treatment:None of these apply   If treatment provided at initial evaluation, no treatment charged due to lack of authorization.     01/30/24: Moist heat pack at low back with x4 layers between skin and pack -  no adverse reactions post removal. 10 mins in place during beginning exercises.  Lumbar rotations x15 Butterfly stretch 2x30s Single knee to chest 3x30s with strap Piriformis stretch 3x30s with strap Cat/cow x10   02/08/24: Reviewed HEP and printed out  Reviewed progress toward goals Reviewed voiding and breathing mechanics  Abdominal massage completed by skilled PT for improved peristalsis    PATIENT EDUCATION:  Education details: See above Person educated: Patient Education method: Explanation, Demonstration, Tactile cues, Verbal cues, and Handouts Education comprehension: verbalized understanding  HOME EXERCISE PROGRAM: [id number]  ASSESSMENT:  CLINICAL IMPRESSION: Patient is a 43 y.o. female who was seen today for physical therapy treatment for chronic constipation, abdominal pain, low back pain, and urinary urgency. Pt reports good improvement continued since last visit and pleased with progress. Pt tolerated session well today, did have abdominal tension throughout and reports no pain but can feel the tightness. Reports feeling much better at end of session. She will continue to benefit from skilled PT intervention in order to improve constipation, decrease urinary urgency, decrease pain, address impairments, and improve quality of life.   OBJECTIVE IMPAIRMENTS: decreased activity tolerance, decreased coordination, decreased endurance, decreased mobility, decreased ROM, decreased strength, increased fascial restrictions, increased muscle spasms, impaired flexibility, impaired tone, improper body mechanics, postural dysfunction, and pain.   ACTIVITY LIMITATIONS: bending, standing, squatting, continence, and urinary urgency, constipation   PARTICIPATION LIMITATIONS: cleaning, laundry, driving, community activity, occupation, and exercise  PERSONAL FACTORS: 3+ comorbidities: medical history are also affecting patient's functional outcome.   REHAB POTENTIAL: Good  CLINICAL  DECISION MAKING: Evolving/moderate complexity  EVALUATION COMPLEXITY: Moderate   GOALS: Goals reviewed with  patient? Yes  SHORT TERM GOALS: Target date: 02/20/2024   Pt will be independent with HEP in order to improve activity tolerance.   Baseline: Goal status: MET  2.  Pt will increase external anal sphincter strength and coordination to 2/5 with appropriate contraction when she thinks she is squeezing in order to improve bowel evacuation and fecal smearing to help with personal hygiene.   Baseline: cannot empty and has smearing after bowel movements; 1/5 strength Goal status: on going  3.  Pt will generate appropriate increase in intra-rectal pressure with pushing during bowel movements in order to completely empty to improve personal hygiene  Baseline: no generation of intra-rectal pressure with pushing Goal status: on going  4.  Pt will increase all impaired lumbar A/ROM by 25% without pain in order to improve length tension relationship in pelvic floor muscles, improving bowel movements.   Baseline: flexion 50% and all others reduced by 75% Goal status: MET  5.  Pt will be independent with use of squatty potty, relaxed toileting mechanics, and improved bowel movement techniques in order to increase ease of bowel movements and complete evacuation.   Baseline:  Goal status: MET  6.  Pt will be able to teach back and utilize urge suppression technique in order to help reduce number of trips to the bathroom.    Baseline:  Goal status: MET  LONG TERM GOALS: Target date: 07/09/2024  Pt will be independent with advanced HEP in order to improve activity tolerance.   Baseline:  Goal status: INITIAL  2.  Pt will improve PFIQ-7 score to less than 30 in order to demonstrate improved quality of life. Baseline: 57 Goal status: INITIAL  3.  Pt will have 5 or more bowel movements a week without straining and complete emptying in order to improve low back and abdominal pain.   Baseline: 7-10/10 pain; 3x/week Goal status: INITIAL  4.  Patient will report 75% improvement in low back/abdominal pain in order to increase activity tolerance.   Baseline: 7-10/10 Goal status: INITIAL  5.  Pt will increase external anal sphincter strength and coordination to 2/5 with appropriate contraction when she thinks she is squeezing in order to improve bowel evacuation and fecal smearing to help with personal hygiene.   Baseline: 1/5 Goal status: INITIAL   PLAN:  PT FREQUENCY: 1-2x/week  PT DURATION: 12 visits    PLANNED INTERVENTIONS: 97164- PT Re-evaluation, 97110-Therapeutic exercises, 97530- Therapeutic activity, 97112- Neuromuscular re-education, [phone]- Self Care, 02859- Manual therapy, [phone]- Gait training, [phone]- Aquatic Therapy, [phone]- Electrical stimulation (unattended), [phone]- Traction (mechanical), [id number]- Ionotophoresis 4mg /ml Dexamethasone , 79439 (1-2 muscles), 20561 (3+ muscles)- Dry Needling, Patient/Family education, Balance training, Taping, Joint mobilization, Joint manipulation, Spinal manipulation, Spinal mobilization, Scar mobilization, Vestibular training, Cryotherapy, Moist heat, and Biofeedback  PLAN FOR NEXT SESSION: Review appropriate toilet mechanics; discuss water and fiber tracking (she needs to increase water but doesn't know how much she's drinking); teach self bowel mobilization techniques to improve motility; manual techniques to abdomen and low back/sacrum/coccyx; mobility/stretches; strengthening; pelvic floor muscle/external anal sphincter contraction training; pressure management  Darryle Navy, PT, DPT 02/08/2511:52 PM  [facility] [address] [place], [place] [address] Phone # [phone] Fax [phone]

## 2024-02-13 ENCOUNTER — Encounter (INDEPENDENT_AMBULATORY_CARE_PROVIDER_SITE_OTHER): Payer: Self-pay | Admitting: Adult Health

## 2024-02-13 ENCOUNTER — Ambulatory Visit (INDEPENDENT_AMBULATORY_CARE_PROVIDER_SITE_OTHER): Payer: Self-pay | Admitting: Adult Health

## 2024-02-13 ENCOUNTER — Telehealth (INDEPENDENT_AMBULATORY_CARE_PROVIDER_SITE_OTHER): Payer: Self-pay | Admitting: Adult Health

## 2024-02-13 VITALS — BP 114/66 | HR 89 | Temp 98.7°F | Ht 69.0 in | Wt 321.0 lb

## 2024-02-13 DIAGNOSIS — R632 Polyphagia: Secondary | ICD-10-CM | POA: Diagnosis not present

## 2024-02-13 DIAGNOSIS — F5089 Other specified eating disorder: Secondary | ICD-10-CM

## 2024-02-13 DIAGNOSIS — E559 Vitamin D deficiency, unspecified: Secondary | ICD-10-CM

## 2024-02-13 DIAGNOSIS — E669 Obesity, unspecified: Secondary | ICD-10-CM | POA: Diagnosis not present

## 2024-02-13 DIAGNOSIS — Z6841 Body Mass Index (BMI) 40.0 and over, adult: Secondary | ICD-10-CM

## 2024-02-13 DIAGNOSIS — R7303 Prediabetes: Secondary | ICD-10-CM

## 2024-02-13 MED ORDER — VITAMIN D (ERGOCALCIFEROL) 1.25 MG (50000 UNIT) PO CAPS: 50000.0000 [IU] | ORAL_CAPSULE | ORAL | 0 refills | Status: DC

## 2024-02-13 MED ORDER — TOPIRAMATE 100 MG PO TABS: 100.0000 mg | ORAL_TABLET | Freq: Every day | ORAL | 0 refills | Status: DC

## 2024-02-13 NOTE — Progress Notes (Signed)
 WEIGHT SUMMARY AND BIOMETRICS  Vitals Temp: 98.7 F (37.1 C) BP: 114/66 Pulse Rate: 89 SpO2: 97 %   Anthropometric Measurements Height: 5' 9 (1.753 m) Weight: (!) 321 lb (145.6 kg) BMI (Calculated): 47.38 Weight at Last Visit: 320lb Weight Lost Since Last Visit: 0lb Weight Gained Since Last Visit: 1lb Starting Weight: 343lb Total Weight Loss (lbs): 22 lb (9.979 kg)   Body Composition  Body Fat %: 53 % Fat Mass (lbs): 170.4 lbs Muscle Mass (lbs): 143.6 lbs Total Body Water (lbs): 100.2 lbs Visceral Fat Rating : 18   Other Clinical Data RMR: [id number] Fasting: No Labs: No Today's Visit #: [id number] Starting Date: 02/08/23    Chief Complaint:   OBESITY Sydney Bradley is here to discuss her progress with her obesity treatment plan.  She is on the the Category 3 Plan and states she is following her eating plan approximately 50 % of the time.  She states she is exercising Walking 30 minutes 3 times per week.  Interim History:  She remains on FMLA- anticipated return to work date is 03/28/24 She reports stable mood, denies SI/HI Since on hiatus from [employer]. DSS- she endorses a great reduction in overall anxiety levels. She has been closely followed by her PCP and will establish with Psychiatrist Dec 2025. She has not been financially impacted by recent government shut down.  Non-Scale Winds: Able to wear a t shirt and jeans today at OV, that she was unable to wear Fall 2024  She suffered non witnessed fall 01/26/2024- she relied on eating out more the first two weeks s/p injury.  Subjective:   1. Prediabetes Discussed Labs  Latest Reference Range & Units 01/16/24 11:08  Glucose 70 - 99 mg/dL 83  Hemoglobin J8R 4.8 - 5.6 % 5.5  Est. average glucose Bld gHb Est-mCnc mg/dL 888  INSULIN  2.6 - 24.9 uIU/mL 20.1    Latest Reference Range & Units 01/16/24 11:08  eGFR >59 mL/min/1.73 71   CBG, A1c, GFR- all at goal Insulin  Level still above goal of 5 She is on  Metformin  XR 500mg - 2 tabs at dinner  2. Polyphagia Discussed Labs 01/16/2024 CMP: Chloride and C02 levels both low She denies tingling sensation, palpitations, or painful muscle cramps. Kidney fx and Liver Enzymes- stable She endorses stable appetite. She is on daily Metformin  500mg - 2 tabs at dinner, daily Wellbutrin  XL 300mg , and nightly Topamax  100mg   3. Vit D Def Discussed Labs  Latest Reference Range & Units 01/16/24 11:08  Vitamin D , 25-Hydroxy 30.0 - 100.0 ng/mL 24.0 (L)  (L): Data is abnormally low  She is on weekly Ergocalciferol - denies N/V/Muscle Weakness  3. Other disorder of eating Discussed Labs  Latest Reference Range & Units 01/16/24 11:08  TSH 0.450 - 4.500 uIU/mL 2.980  T4,Free(Direct) 0.82 - 1.77 ng/dL 8.95   Thyroid  Panel stable She is on daily Topamax  100mg - denies paresthesias or word finding problems  Assessment/Plan:   1. Prediabetes (Primary) Continue healthy eating and increase regular walking  2. Polyphagia Continue healthy eating and increase regular walking  3. Vit D Def Refill and INCREASE   Vitamin D , Ergocalciferol , (DRISDOL ) 1.25 MG (50000 UNIT) CAPS capsule Take 1 capsule (50,000 Units total) by mouth every 3 (three) days. Dispense: 12 capsule, Refills: 0 ordered   4. Other disorder of eating Refill - topiramate  (TOPAMAX ) 100 MG tablet; Take 1 tablet (100 mg total) by mouth daily before supper.  Dispense: 30 tablet; Refill: 0  5. BMI 45.0-49.9, adult (  HCC), CURRENT BMI 47.5  Sydney Bradley is currently in the action stage of change. As such, her goal is to continue with weight loss efforts. She has agreed to the Category 3 Plan.   Exercise goals: For substantial health benefits, adults should do at least 150 minutes (2 hours and 30 minutes) a week of moderate-intensity, or 75 minutes (1 hour and 15 minutes) a week of vigorous-intensity aerobic physical activity, or an equivalent combination of moderate- and vigorous-intensity aerobic activity.  Aerobic activity should be performed in episodes of at least 10 minutes, and preferably, it should be spread throughout the week.  Behavioral modification strategies: increasing lean protein intake, decreasing simple carbohydrates, increasing vegetables, increasing water intake, no skipping meals, meal planning and cooking strategies, keeping healthy foods in the home, ways to avoid boredom eating, and planning for success.  Sydney Bradley has agreed to follow-up with our clinic in 4 weeks. She was informed of the importance of frequent follow-up visits to maximize her success with intensive lifestyle modifications for her multiple health conditions.   Objective:   Blood pressure 114/66, pulse 89, temperature 98.7 F (37.1 C), height 5' 9 (1.753 m), weight (!) 321 lb (145.6 kg), last menstrual period 01/29/2024, SpO2 97%. Body mass index is 47.4 kg/m.  General: Cooperative, alert, well developed, in no acute distress. HEENT: Conjunctivae and lids unremarkable. Cardiovascular: Regular rhythm.  Lungs: Normal work of breathing. Neurologic: No focal deficits.   Lab Results  Component Value Date   CREATININE 1.00 01/16/2024   BUN 8 01/16/2024   NA 138 01/16/2024   K 4.1 01/16/2024   CL 107 (H) 01/16/2024   CO2 18 (L) 01/16/2024   Lab Results  Component Value Date   ALT 12 01/16/2024   AST 14 01/16/2024   ALKPHOS 75 01/16/2024   BILITOT 0.4 01/16/2024   Lab Results  Component Value Date   HGBA1C 5.5 01/16/2024   HGBA1C 5.4 11/09/2023   HGBA1C 5.7 (H) 02/08/2023   HGBA1C 5.6 08/14/2019   HGBA1C 5.8 11/11/2017   Lab Results  Component Value Date   INSULIN  20.1 01/16/2024   INSULIN  20.0 11/09/2023   INSULIN  12.3 02/08/2023   Lab Results  Component Value Date   TSH 2.980 01/16/2024   Lab Results  Component Value Date   CHOL 151 02/08/2023   HDL 44 02/08/2023   LDLCALC 90 02/08/2023   TRIG 89 02/08/2023   CHOLHDL 3 08/14/2019   Lab Results  Component Value Date   VD25OH  24.0 (L) 01/16/2024   VD25OH 19.9 (L) 11/09/2023   VD25OH 21.1 (L) 02/08/2023   Lab Results  Component Value Date   WBC 5.6 09/26/2023   HGB 12.8 09/26/2023   HCT 39.5 09/26/2023   MCV 82.6 09/26/2023   PLT 249.0 09/26/2023   Lab Results  Component Value Date   IRON 74 12/18/2021   TIBC 289.8 12/18/2021   FERRITIN 77.6 12/18/2021   Attestation Statements:   Reviewed by clinician on day of visit: allergies, medications, problem list, medical history, surgical history, family history, social history, and previous encounter notes.  I have reviewed the above documentation for accuracy and completeness, and I agree with the above. -  Loyd Marhefka d. Shikara Mcauliffe, NP-C

## 2024-02-13 NOTE — Telephone Encounter (Signed)
 Called pt back to inform her that her Pharmacy has received the correct Rx refill. She needs to call her pharmacy to discuss a pick time. Pt verbalized understanding. Pt had no questions at this time but was encouraged to call back if questions arise.

## 2024-02-13 NOTE — Telephone Encounter (Signed)
 Good mornign!  [facility] is calling for clarification on the vitamin D  script because they received two different ones. [phone]   Thanks!

## 2024-02-15 ENCOUNTER — Ambulatory Visit: Attending: Internal Medicine | Admitting: Physical Therapy

## 2024-02-15 ENCOUNTER — Encounter: Payer: Self-pay | Admitting: Physical Therapy

## 2024-02-15 DIAGNOSIS — R279 Unspecified lack of coordination: Secondary | ICD-10-CM | POA: Insufficient documentation

## 2024-02-15 DIAGNOSIS — R293 Abnormal posture: Secondary | ICD-10-CM | POA: Insufficient documentation

## 2024-02-15 DIAGNOSIS — R1030 Lower abdominal pain, unspecified: Secondary | ICD-10-CM | POA: Insufficient documentation

## 2024-02-15 DIAGNOSIS — M6281 Muscle weakness (generalized): Secondary | ICD-10-CM | POA: Insufficient documentation

## 2024-02-15 DIAGNOSIS — M5459 Other low back pain: Secondary | ICD-10-CM | POA: Diagnosis present

## 2024-02-15 NOTE — Therapy (Signed)
 OUTPATIENT PHYSICAL THERAPY FEMALE PELVIC TREATMENT   Patient Name: Sydney Bradley MRN: [id number] DOB:22-Apr-1980, 43 y.o., female Today's Date: 02/15/2024  END OF SESSION:  PT End of Session - 02/15/24 1153     Visit Number 4    Date for Recertification  07/09/24    Authorization Type [employer]    PT Start Time 1153    PT Stop Time 1225    PT Time Calculation (min) 32 min    Activity Tolerance Patient tolerated treatment well    Behavior During Therapy WFL for tasks assessed/performed           Past Medical History:  Diagnosis Date   Allergy    Anxiety    Arthritis    L5 Back - no meds - otc prn   Asthma    Back pain    Constipation    Crohn's disease of ileum (HCC)    Fatty liver    GERD (gastroesophageal reflux disease)    High blood pressure    Hypertension    no meds x 3 yrs   IBS (irritable bowel syndrome)    Joint pain    Morbid obesity with BMI of 45.0-49.9, adult (HCC)    Recurrent upper respiratory infection (URI)    Sacral fracture (HCC)    Urticaria    Vitamin B 12 deficiency    Vitamin D  deficiency    Past Surgical History:  Procedure Laterality Date   ADENOIDECTOMY     COLONOSCOPY     DILATION AND EVACUATION  02/25/2012   Procedure: DILATATION AND EVACUATION;  Surgeon: Rosaline DELENA Luna, MD;  Location: [facility];  Service: Gynecology;  Laterality: N/A;   DILATION AND EVACUATION N/A 03/01/2013   Procedure: DILATATION AND EVACUATION;  Surgeon: Oneil FORBES Piety, MD;  Location: [facility];  Service: Gynecology;  Laterality: N/A;   TONSILLECTOMY     TONSILLECTOMY AND ADENOIDECTOMY  2007   Patient Active Problem List   Diagnosis Date Noted   Prediabetes 02/09/2023   Other fatigue 02/08/2023   SOB (shortness of breath) on exertion 02/08/2023   Vitamin D  deficiency 02/08/2023   Polyarthralgia 02/08/2023   Health care maintenance 02/08/2023   Elevated glucose 02/08/2023   Elevated blood pressure reading 02/08/2023   Class 2 severe obesity due to  excess calories with serious comorbidity in adult 02/08/2023   Plantar fasciitis 07/28/2018   Asthma 07/25/2018   BMI 45.0-49.9, adult (HCC) 12/13/2017   Long-term use of immunosuppressant medication - Cimzia  07/01/2017   Crohn's ileitis, with diarrhea (HCC) 12/10/2016   Gastroesophageal reflux disease 12/10/2016   Chronic hypertension complicating or reason for care during pregnancy    Pre-existing hypertension affecting pregnancy in second trimester    Other obesity 06/26/2013   Mild persistent asthma 11/14/2012    PCP: Merna Huxley, NP  REFERRING PROVIDER: Avram Lupita FORBES, MD   REFERRING DIAG: K59.00 (ICD-10-CM) - Constipation, unspecified constipation type  THERAPY DIAG:  Unspecified lack of coordination  Muscle weakness (generalized)  Abnormal posture  Lower abdominal pain  Other low back pain  Rationale for Evaluation and Treatment: Rehabilitation  ONSET DATE: chronic - as long as she can remember   SUBJECTIVE:  SUBJECTIVE STATEMENT: Bowel movement more often. Still has to sit for 30 minutes at a time.  Back is feeling much better now.  Was out of town and ate out a lot so this changed her bowels more with having more type 7, no struggles emptying but since being home has been drinking more water and eating better had formed stool today (first time since Saturday).  Urine has been much better, no urgency or leakage.    PAIN:  Are you having pain? Yes NPRS scale: 1/10 low back pain,  02/15/24: pain level 5/10 due to bad arthritis day Pain location: low back/abdominal pain  Pain type: pressure and cramping Pain description: intermittent   Aggravating factors: constipation Relieving factors: bowel movements   PRECAUTIONS: None  RED FLAGS: None   WEIGHT BEARING  RESTRICTIONS: No  FALLS:  Has patient fallen in last 6 months? 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   ACTIVITY LEVEL : she wis working on walking and going to the gym  PLOF: Independent  PATIENT GOALS: improve bowel movements   PERTINENT HISTORY:  D&C x 2, Chron's, GERD, asthma, back pain, IBS, sacral fracture and L5 Sexual abuse: No  BOWEL MOVEMENT: Pain with bowel movement: Yes - sometimes  Type of bowel movement:Type (Bristol Stool Scale) 4-7, Frequency 3x/week, Strain yes, and Splinting pushes on abdomen; not complete and can't get clean Fully empty rectum: No Leakage: Yes: cannot get clean after but does not leak at other times  Pads: Yes: panty liner Fiber supplement/laxative Yes - stool softener, miralax  URINATION: Pain with urination: No Fully empty bladder: No Stream: Strong Urgency: Yes  Frequency: 3x/day; occasionally 1xnight Fluid Intake: working on drinking more water - out on FMLA currently so getting more Leakage: none Pads: Yes: panty liner  INTERCOURSE:  Ability to have vaginal penetration Yes  Pain with intercourse: none DrynessNo Climax: harder to achieve  [place] Scale: 0/3 Lubricant: none   PREGNANCY: Vaginal deliveries 1 Tearing Yes:   Episiotomy No C-section deliveries 0 Currently pregnant No  PROLAPSE: None   OBJECTIVE:  Note: Objective measures were completed at Evaluation unless otherwise noted.  01/23/24 PATIENT SURVEYS:   PFIQ-7: 57 (bowel)  COGNITION: Overall cognitive status: Within functional limits for tasks assessed     SENSATION: Light touch: Appears intact   FUNCTIONAL TESTS:  Squat: use of hands on thighs for support Single leg stance:  Rt: pelvic drop  Lt: pelvic drop Curl-up test: abdominal distortion    GAIT: Assistive device utilized: None Comments: decreased trunk rotation and increased anterior pelvic tilt  POSTURE: rounded shoulders, forward head, increased lumbar lordosis,  increased thoracic kyphosis, anterior pelvic tilt, and scoliosis with Rt thoracic curvature   LUMBARAROM/PROM:  A/PROM A/PROM  Eval (% available)  Flexion 50 pain  Extension 25, pain  Right lateral flexion 25 pain  Left lateral flexion 25 pain  Right rotation 25 pain  Left rotation 25 paim   (Blank rows = not tested)  PALPATION:   General: tightness in lower thoracic and lumbar paraspinals, tenderness throughout low back, sacrum and coccyx  Pelvic Alignment: anterior pelvic tilt  Abdominal: tenderness throughout upper and lower, more notable lower abdominal tenderness and report of pressure; tightness throughout abdominals                 External Perineal Exam: WNL  Internal Pelvic Floor: non-tender, anterior vaginal wall laxity palpable; external anal sphincter dyssynergia with paradoxical contraction and no appropriate increase in intra-rectal pressure  Patient confirms identification and approves PT to assess internal pelvic floor and treatment Yes  PELVIC MMT:   MMT eval 02/15/24  Vaginal 3/5, 6 seconds, 7 repeat contractions   Internal Anal Sphincter 1/5 4/5  External Anal Sphincter 1/5 4/5  Puborectalis 1/5 4/5  Diastasis Recti 3 finger widths    (Blank rows = not tested)        TONE: WNL  PROLAPSE: Grade 2 anterior vaginal wall laxity. Grade 1 posterior vaginal wall laxity  TODAY'S TREATMENT:                                                                                                                              DATE:   01/23/24 EVAL  Therapeutic activities: Urge drill Use of squatty potty  Pushing mechanics - exhale like blowing up a tight balloon when trying to have bowel movement - imagery to help anus and pelvic floor muscles relax with pushing For all possible CPT codes, reference the Planned Interventions line above.     Check all conditions that are expected to impact treatment: {Conditions expected to impact  treatment:None of these apply   If treatment provided at initial evaluation, no treatment charged due to lack of authorization.     01/30/24: Moist heat pack at low back with x4 layers between skin and pack - no adverse reactions post removal. 10 mins in place during beginning exercises.  Lumbar rotations x15 Butterfly stretch 2x30s Single knee to chest 3x30s with strap Piriformis stretch 3x30s with strap Cat/cow x10   02/08/24: Reviewed HEP and printed out  Reviewed progress toward goals Reviewed voiding and breathing mechanics  Abdominal massage completed by skilled PT for improved peristalsis   02/15/24 Manual: Soft tissue mobilization: Manual work to the perineal body with gloved hand in right sidely Internal pelvic floor techniques: No emotional/communication barriers or cognitive limitation. Patient is motivated to learn. Patient understands and agrees with treatment goals and plan. PT explains patient will be examined in standing, sitting, and lying down to see how their muscles and joints work. When they are ready, they will be asked to remove their underwear so PT can examine their perineum. The patient is also given the option of providing their own chaperone as one is not provided in our facility. The patient also has the right and is explained the right to defer or refuse any part of the evaluation or treatment including the internal exam. With the patient's consent, PT will use one gloved finger to gently assess the muscles of the pelvic floor, seeing how well it contracts and relaxes and if there is muscle symmetry. After, the patient will get dressed and PT and patient will discuss exam findings and plan of care. PT and patient discuss plan of care, schedule, attendance policy and HEP activities.  Therapist  gloved finger in the rectum and other finger on the anterior left perineal body working on elongating the tissue then done the same along the anococcygeal  ligament Neuromuscular re-education: Pelvic floor contraction training: Therapist gloved finger in the rectum working on contraction and pulling the puborectalis forward then working on pushing the therapist finger out of the rectum with diaphragmatic breathing then breathing to generate force to push finger out while laying on right side Self-care: Educated patient to not sit longer than 10 minutes. She is to leave then come back to the commode when she has the urge or after a meal. Educated patient on how to chew her food well to improve the gastrocolic reflex.  Discussed her goals to see if she is meeting them and reviewed them.  Discussed with patient to eat her fiber for fiber tracking and she is trying to incorporate more fiber.      PATIENT EDUCATION:  Education details: [id number] Person educated: Patient Education method: Explanation, Demonstration, Actor cues, Verbal cues, and Handouts Education comprehension: verbalized understanding  HOME EXERCISE PROGRAM: [id number]  ASSESSMENT:  CLINICAL IMPRESSION: Patient is a 43 y.o. female who was seen today for physical therapy treatment for chronic constipation, abdominal pain, low back pain, and urinary urgency. Patient is able to walk to the bathroom without leaking urine.  Patient is able to fully empty her rectum now. Patient is able to wipe her rectum in the shower and not see stool on the cloth now. Patient is drinking more water. Pelvic floor strength increased to 4/5 after manual work. She was able to partially push the therapist finger out of the rectum and needs to work on it further. She will continue to benefit from skilled PT intervention in order to improve constipation, decrease urinary urgency, decrease pain, address impairments, and improve quality of life.   OBJECTIVE IMPAIRMENTS: decreased activity tolerance, decreased coordination, decreased endurance, decreased mobility, decreased ROM, decreased strength, increased  fascial restrictions, increased muscle spasms, impaired flexibility, impaired tone, improper body mechanics, postural dysfunction, and pain.   ACTIVITY LIMITATIONS: bending, standing, squatting, continence, and urinary urgency, constipation   PARTICIPATION LIMITATIONS: cleaning, laundry, driving, community activity, occupation, and exercise  PERSONAL FACTORS: 3+ comorbidities: medical history are also affecting patient's functional outcome.   REHAB POTENTIAL: Good  CLINICAL DECISION MAKING: Evolving/moderate complexity  EVALUATION COMPLEXITY: Moderate   GOALS: Goals reviewed with patient? Yes  SHORT TERM GOALS: Target date: 02/20/2024   Pt will be independent with HEP in order to improve activity tolerance.   Baseline: Goal status: MET 02/15/24  2.  Pt will increase external anal sphincter strength and coordination to 2/5 with appropriate contraction when she thinks she is squeezing in order to improve bowel evacuation and fecal smearing to help with personal hygiene.   Baseline: cannot empty and has smearing after bowel movements; 1/5 strength Goal status: Met 02/15/24  3.  Pt will generate appropriate increase in intra-rectal pressure with pushing during bowel movements in order to completely empty to improve personal hygiene  Baseline: no generation of intra-rectal pressure with pushing Goal status: Met 02/15/24  4.  Pt will increase all impaired lumbar A/ROM by 25% without pain in order to improve length tension relationship in pelvic floor muscles, improving bowel movements.   Baseline: flexion 50% and all others reduced by 75% Goal status: MET 02/15/24  5.  Pt will be independent with use of squatty potty, relaxed toileting mechanics, and improved bowel movement techniques in order to increase ease of bowel movements  and complete evacuation.   Baseline:  Goal status: MET 02/15/24  6.  Pt will be able to teach back and utilize urge suppression technique in order to help  reduce number of trips to the bathroom.    Baseline:  Goal status: MET 02/15/24  LONG TERM GOALS: Target date: 07/09/2024  Pt will be independent with advanced HEP in order to improve activity tolerance.   Baseline:  Goal status: INITIAL  2.  Pt will improve PFIQ-7 score to less than 30 in order to demonstrate improved quality of life. Baseline: 57 Goal status: INITIAL  3.  Pt will have 5 or more bowel movements a week without straining and complete emptying in order to improve low back and abdominal pain.  Baseline: 7-10/10 pain; 3x/week Goal status: INITIAL  4.  Patient will report 75% improvement in low back/abdominal pain in order to increase activity tolerance.   Baseline: 7-10/10 Goal status: INITIAL  5.  Pt will increase external anal sphincter strength and coordination to 2/5 with appropriate contraction when she thinks she is squeezing in order to improve bowel evacuation and fecal smearing to help with personal hygiene.   Baseline: 1/5 Goal status: INITIAL   PLAN:  PT FREQUENCY: 1-2x/week  PT DURATION: 12 visits    PLANNED INTERVENTIONS: 97164- PT Re-evaluation, 97110-Therapeutic exercises, 97530- Therapeutic activity, 97112- Neuromuscular re-education, 97535- Self Care, 02859- Manual therapy, [phone]- Gait training, [phone]- Aquatic Therapy, [phone]- Electrical stimulation (unattended), [phone]- Traction (mechanical), [id number]- Ionotophoresis 4mg /ml Dexamethasone , 79439 (1-2 muscles), 20561 (3+ muscles)- Dry Needling, Patient/Family education, Balance training, Taping, Joint mobilization, Joint manipulation, Spinal manipulation, Spinal mobilization, Scar mobilization, Vestibular training, Cryotherapy, Moist heat, and Biofeedback  PLAN FOR NEXT SESSION: Review appropriate toilet mechanics;  teach self bowel mobilization techniques to improve motility; manual techniques to abdomen and low back/sacrum/coccyx; mobility/stretches; strengthening; pelvic floor muscle/external anal  sphincter contraction training; pressure management  Channing Pereyra, PT 02/15/24 12:30 PM  [facility] [address], [address] [place], [place] [address] Phone # [phone] Fax [phone]

## 2024-02-16 ENCOUNTER — Other Ambulatory Visit: Payer: Self-pay

## 2024-02-16 ENCOUNTER — Encounter: Payer: Self-pay | Admitting: Allergy

## 2024-02-16 ENCOUNTER — Ambulatory Visit: Admitting: Allergy

## 2024-02-16 VITALS — BP 126/82 | HR 78 | Temp 98.2°F | Wt 323.0 lb

## 2024-02-16 DIAGNOSIS — K9049 Malabsorption due to intolerance, not elsewhere classified: Secondary | ICD-10-CM

## 2024-02-16 DIAGNOSIS — J3089 Other allergic rhinitis: Secondary | ICD-10-CM

## 2024-02-16 DIAGNOSIS — J4520 Mild intermittent asthma, uncomplicated: Secondary | ICD-10-CM | POA: Diagnosis not present

## 2024-02-16 DIAGNOSIS — L501 Idiopathic urticaria: Secondary | ICD-10-CM

## 2024-02-16 DIAGNOSIS — T7800XD Anaphylactic reaction due to unspecified food, subsequent encounter: Secondary | ICD-10-CM

## 2024-02-16 DIAGNOSIS — H1013 Acute atopic conjunctivitis, bilateral: Secondary | ICD-10-CM | POA: Diagnosis not present

## 2024-02-16 DIAGNOSIS — T7800XA Anaphylactic reaction due to unspecified food, initial encounter: Secondary | ICD-10-CM

## 2024-02-16 MED ORDER — LEVOCETIRIZINE DIHYDROCHLORIDE 5 MG PO TABS: 5.0000 mg | ORAL_TABLET | Freq: Two times a day (BID) | ORAL | 5 refills | Status: AC

## 2024-02-16 MED ORDER — ALBUTEROL SULFATE HFA 108 (90 BASE) MCG/ACT IN AERS: 2.0000 | INHALATION_SPRAY | RESPIRATORY_TRACT | 1 refills | Status: AC | PRN

## 2024-02-16 MED ORDER — FAMOTIDINE 20 MG PO TABS: 20.0000 mg | ORAL_TABLET | Freq: Two times a day (BID) | ORAL | 5 refills | Status: AC

## 2024-02-16 MED ORDER — MONTELUKAST SODIUM 10 MG PO TABS: 10.0000 mg | ORAL_TABLET | Freq: Every day | ORAL | 5 refills | Status: AC

## 2024-02-16 NOTE — Progress Notes (Unsigned)
 Follow-up Note  RE: Sydney Bradley MRN: [id number] DOB: 06-21-1980 Date of Office Visit: 02/16/2024   History of present illness: Sydney Bradley is a 43 y.o. female presenting today for follow-up of hives, allergic rhinitis, food allergy/intolerance, asthma.  She was last seen in the office on 08/12/23 by myself.  Discussed the use of AI scribe software for clinical note transcription with the patient, who gave verbal consent to proceed.  History of Present Illness   Sydney Bradley is a 43 year old female with respiratory issues and chronic hives who presents for follow-up on her breathing and skin conditions.  Her respiratory symptoms have been stable since her last visit in May. She has not needed to use her rescue inhaler. Recent pulmonary function tests showed expiratory flow rates of 92% and 93%.  Her chronic hives and itching have significantly improved. She experiences occasional itching but not to the extent of previous episodes. She maintains a regimen of Xyzal , Pepcid  twice daily, and Singulair  once daily. No severe itching disrupts her sleep.  She experiences sneezing, sinus discomfort, and postnasal drip, particularly after outdoor activities such as attending football games. These symptoms impact her vocal performance, as she is involved in singing activities. She dislikes using Flonase  due to its burning sensation and has not used it. She has not tried other nasal sprays like azelastine or Atrovent  in the past.  Her eyes have not been itching.      Review of systems: 10pt ROS negative unless noted above in HPI   Past medical/social/surgical/family history have been reviewed and are unchanged unless specifically indicated below.  No changes  Medication List: Current Outpatient Medications  Medication Sig Dispense Refill   albuterol  (PROVENTIL ) (2.5 MG/3ML) 0.083% nebulizer solution Take 3 mLs (2.5 mg total) by nebulization every 4 (four) hours as needed for wheezing or shortness of  breath. 75 mL 0   albuterol  (VENTOLIN  HFA) 108 (90 Base) MCG/ACT inhaler Inhale 2 puffs into the lungs every 4 (four) hours as needed for wheezing or shortness of breath. 18 g 1   buPROPion  (WELLBUTRIN  XL) 300 MG 24 hr tablet TAKE ONE TABLET BY MOUTH ONCE A DAY 90 tablet 0   clotrimazole -betamethasone  (LOTRISONE ) cream Apply topically.     diphenhydrAMINE  (BENADRYL ) 25 MG tablet Take 25 mg by mouth as needed for allergies.      EPINEPHrine  0.3 mg/0.3 mL IJ SOAJ injection Inject 0.3 mg into the muscle as needed for anaphylaxis. 2 each 1   famotidine  (PEPCID ) 20 MG tablet Take 1 tablet (20 mg total) by mouth 2 (two) times daily. 60 tablet 5   fluticasone  (FLONASE ) 50 MCG/ACT nasal spray Place 2 sprays into both nostrils daily. 16 g 6   HYDROcodone -acetaminophen  (NORCO/VICODIN) 5-325 MG tablet Take 1 tablet by mouth every 6 (six) hours as needed for moderate pain (pain score 4-6). 10 tablet 0   levocetirizine (XYZAL ) 5 MG tablet Take up to 2 tablets twice daily by mouth as needed for hives. 120 tablet 5   metFORMIN  (GLUCOPHAGE -XR) 500 MG 24 hr tablet Take two tablets with dinner 60 tablet 0   montelukast  (SINGULAIR ) 10 MG tablet Take 1 tablet (10 mg total) by mouth at bedtime. 30 tablet 5   Olopatadine  HCl (PATADAY ) 0.2 % SOLN 1 drop each eye daily as needed for itchy/watery eyes. 2.5 mL 3   omeprazole  (PRILOSEC) 40 MG capsule TAKE ONE CAPSULE BY MOUTH ONCE A DAY BEFORE BREAKFAST 90 capsule 3   sertraline  (ZOLOFT ) 50 MG tablet  Take 1 tablet (50 mg total) by mouth daily. 30 tablet 3   topiramate  (TOPAMAX ) 100 MG tablet Take 1 tablet (100 mg total) by mouth daily before supper. 30 tablet 0   traZODone  (DESYREL ) 50 MG tablet TAKE ONE OR TWO TABLETS BY MOUTH AT BEDTIME AS NEEDED FOR SLEEP (Patient taking differently: Take 50 mg by mouth as needed.) 60 tablet 6   Vitamin D , Ergocalciferol , (DRISDOL ) 1.25 MG (50000 UNIT) CAPS capsule Take 1 capsule (50,000 Units total) by mouth every 3 (three) days. 12  capsule 0   Current Facility-Administered Medications  Medication Dose Route Frequency Provider Last Rate Last Admin   omalizumab  (XOLAIR ) prefilled syringe 300 mg  300 mg Subcutaneous Q28 days Jeneal Danita Macintosh, MD   300 mg at 02/14/23 1629     Known medication allergies: Allergies  Allergen Reactions   Ciprofloxacin Hives   Lamisil [Terbinafine] Hives   Latex Hives    Breathing problems   Other Anaphylaxis and Itching    Nuts Tree nuts   Peanut-Containing Drug Products Anaphylaxis    Allergy to all nuts    Shellfish Allergy Anaphylaxis   Ciprofibrate Hives   Penicillins Hives    Has patient had a PCN reaction causing immediate rash, facial/tongue/throat swelling, SOB or lightheadedness with hypotension: Yes Has patient had a PCN reaction causing severe rash involving mucus membranes or skin necrosis: No Has patient had a PCN reaction that required hospitalization No Has patient had a PCN reaction occurring within the last 10 years: No If all of the above answers are NO, then may proceed with Cephalosporin use.    Chocolate Itching   Wheat Itching     Physical examination: Blood pressure 126/82, pulse 78, temperature 98.2 F (36.8 C), temperature source Temporal, weight (!) 323 lb (146.5 kg), last menstrual period 01/29/2024, SpO2 96%.  General: Alert, interactive, in no acute distress. HEENT: PERRLA, TMs pearly gray, turbinates minimally edematous without discharge, post-pharynx non erythematous. Neck: Supple without lymphadenopathy. Lungs: Clear to auscultation without wheezing, rhonchi or rales. {no increased work of breathing. CV: Normal S1, S2 without murmurs. Abdomen: Nondistended, nontender. Skin: Warm and dry, without lesions or rashes. Extremities:  No clubbing, cyanosis or edema. Neuro:   Grossly intact.  Diagnostics/Labs:  Spirometry: FEV1: 2.69L 92%, FVC: 3.4L 93%, ratio consistent with nonobstructive pattern  Assessment and plan:   Hives  and Itching, chronic - at this time etiology of hives and itching is spontaneous.  Hives can be caused by a variety of different triggers including illness/infection, foods, medications, stings, exercise, pressure, vibrations, extremes of temperature to name a few however majority of the time there is no identifiable trigger.   - continue Xyzal  5mg  1 tab twice day, Pepcid  20mg  1 tab twice a day and continue Singulair  10mg  daily  - Xolair  tried however unable to continue due to insurance coverage/cost  - Discussed today newest approved option for hive control is oral Rhapsido.  Currently we are stable with the antihistamine/leukotriene regimen as above and we will continue with this.  Will discuss in more detail Rhapsido in the future if we need to proceed with this medication.  Informational pamphlet provided today.  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  - Eating white fish without issue thus can continue this in the diet. - Has noted oral symptoms with peanut ingestion thus continue to avoid.  There may be an oral allergy syndrome component with pollen with peanut ingestion. - have access to self-injectable epinephrine  (Epipen  or AuviQ) 0.3mg  at all times - follow emergency action plan in case of allergic reaction  Asthma - have access to albuterol  inhaler 2 puffs every 4-6 hours as needed for cough/wheeze/shortness of breath/chest tightness.  May use 15-20 minutes prior to activity.   Monitor frequency of use.    Follow-up in 6 months or sooner if needed  I appreciate the opportunity to take part in Edison's care. Please do not hesitate to contact me  with questions.  Sincerely,   Danita Brain, MD Allergy/Immunology [facility]

## 2024-02-16 NOTE — Patient Instructions (Addendum)
 Hives and Itching, chronic - at this time etiology of hives and itching is spontaneous.  Hives can be caused by a variety of different triggers including illness/infection, foods, medications, stings, exercise, pressure, vibrations, extremes of temperature to name a few however majority of the time there is no identifiable trigger.   - continue Xyzal  5mg  1 tab twice day, Pepcid  10mg  1 tab twice a day and continue Singulair  10mg  daily  - Xolair  tried however unable to continue due to insurance coverage/cost  - Discussed today newest approved option for hive control is oral Rhapsido.  Currently we are stable with the antihistamine/leukotriene regimen as above and we will continue with this.  Will discuss in more detail Rhapsido in the future if we need to proceed with this medication.  Informational pamphlet provided today.  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  - Eating white fish without issue thus can continue this in the diet. - Has noted oral symptoms with peanut ingestion thus continue to avoid.  There may be an oral allergy syndrome component with pollen with peanut ingestion. - have access to self-injectable epinephrine  (Epipen  or AuviQ) 0.3mg  at all times - follow emergency action plan in case of allergic reaction  Asthma - have access to albuterol  inhaler 2 puffs every 4-6 hours as needed for cough/wheeze/shortness of breath/chest tightness.  May use 15-20 minutes prior to activity.   Monitor frequency of use.    Follow-up in 6 months or sooner if needed

## 2024-02-17 ENCOUNTER — Other Ambulatory Visit (INDEPENDENT_AMBULATORY_CARE_PROVIDER_SITE_OTHER): Payer: Self-pay | Admitting: Adult Health

## 2024-02-21 ENCOUNTER — Encounter: Payer: Self-pay | Admitting: Adult Health

## 2024-02-24 ENCOUNTER — Ambulatory Visit: Admitting: Physical Therapy

## 2024-02-29 ENCOUNTER — Ambulatory Visit (INDEPENDENT_AMBULATORY_CARE_PROVIDER_SITE_OTHER): Admitting: Adult Health

## 2024-02-29 ENCOUNTER — Encounter: Payer: Self-pay | Admitting: Adult Health

## 2024-02-29 VITALS — BP 110/60 | HR 72 | Temp 98.4°F | Ht 69.0 in | Wt 323.0 lb

## 2024-02-29 DIAGNOSIS — G43811 Other migraine, intractable, with status migrainosus: Secondary | ICD-10-CM

## 2024-02-29 MED ORDER — KETOROLAC TROMETHAMINE 60 MG/2ML IM SOLN
60.0000 mg | Freq: Once | INTRAMUSCULAR | Status: AC
  Administered 2024-02-29: 60 mg via INTRAMUSCULAR

## 2024-02-29 MED ORDER — METHYLPREDNISOLONE ACETATE 80 MG/ML IJ SUSP
80.0000 mg | Freq: Once | INTRAMUSCULAR | Status: AC
  Administered 2024-02-29: 80 mg via INTRAMUSCULAR
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
## 2024-02-29 NOTE — Progress Notes (Signed)
 Subjective:    Patient ID: Sydney Bradley, female    DOB: [birth year], 43 y.o.   MRN: [id number]  Headache       Review of Systems  Neurological:  Positive for headaches.   See HPI   Past Medical History:  Diagnosis Date   Allergy    Anxiety    Arthritis    L5 Back - no meds - otc prn   Asthma    Back pain    Constipation    Crohn's disease of ileum (HCC)    Fatty liver    GERD (gastroesophageal reflux disease)    High blood pressure    Hypertension    no meds x 3 yrs   IBS (irritable bowel syndrome)    Joint pain    Morbid obesity with BMI of 45.0-49.9, adult (HCC)    Recurrent upper respiratory infection (URI)    Sacral fracture (HCC)    Urticaria    Vitamin B 12 deficiency    Vitamin D  deficiency     Social History   Socioeconomic History   Marital status: Married    Spouse name: Not on file   Number of children: 1   Years of education: 18   Highest education level: Master's degree (e.g., MA, MS, MEng, MEd, MSW, MBA)  Occupational History   Occupation: child psychotherapist    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[place], foster care   Married    One biologic son Judeth) born 2016 and a stepson   One caffeinated beverage daily   Social Drivers of Health   Financial Resource Strain: Low Risk  (10/07/2021)   Overall Financial Resource Strain (CARDIA)    Difficulty of Paying Living Expenses: Not very hard  Food Insecurity: No Food Insecurity (10/07/2021)   Hunger Vital Sign    Worried About Running Out of Food in the Last Year: Never  true    Ran Out of Food in the Last Year: Never true  Transportation Needs: No Transportation Needs (10/07/2021)   PRAPARE - Administrator, Civil Service (Medical): No    Lack of Transportation (Non-Medical): No  Physical Activity: Unknown (10/07/2021)   Exercise Vital Sign    Days of Exercise per Week: 0 days    Minutes of Exercise per Session: Not on file  Stress: Stress Concern Present (10/07/2021)   [employer] of Occupational Health - Occupational Stress Questionnaire    Feeling of Stress : To some extent  Social Connections: Moderately Integrated (10/07/2021)   Social Connection and Isolation Panel    Frequency of Communication with Friends and Family: More than three times a week    Frequency of Social Gatherings with Friends and Family: Once a week    Attends Religious Services: More than 4 times per year    Active Member of [employer] or Organizations: No    Attends Banker Meetings: Not on file    Marital Status: Married  Catering Manager Violence: Not  At Risk (02/12/2023)   Received from [facility]   HITS    Over the last 12 months how often did your partner physically hurt you?: Never    Over the last 12 months how often did your partner insult you or talk down to you?: Never    Over the last 12 months how often did your partner threaten you with physical harm?: Never    Over the last 12 months how often did your partner scream or curse at you?: Never    Past Surgical History:  Procedure Laterality Date   ADENOIDECTOMY     COLONOSCOPY     DILATION AND EVACUATION  02/25/2012   Procedure: DILATATION AND EVACUATION;  Surgeon: Rosaline DELENA Luna, MD;  Location: [facility];  Service: Gynecology;  Laterality: N/A;   DILATION AND EVACUATION N/A 03/01/2013   Procedure: DILATATION AND EVACUATION;  Surgeon: Oneil FORBES Piety, MD;  Location: [facility];  Service: Gynecology;  Laterality: N/A;   TONSILLECTOMY     TONSILLECTOMY AND ADENOIDECTOMY  2007    Family  History  Problem Relation Age of Onset   Depression Mother    Obesity Mother    Colon cancer Father 78       mets to liver and bone died at 87   Anxiety disorder Father    Bipolar disorder Father    Drug abuse Father    Obesity Father    Angioedema Sister    COPD Maternal Grandmother    Dementia Maternal Grandmother    Alzheimer's disease Maternal Grandfather    Cancer Paternal Grandmother        blood cancer?   Stroke Paternal Grandmother    Eczema Son    Asthma Son    Allergic rhinitis Son    Stomach cancer Neg Hx    Esophageal cancer Neg Hx     Allergies  Allergen Reactions   Ciprofloxacin Hives   Lamisil [Terbinafine] Hives   Latex Hives    Breathing problems   Other Anaphylaxis and Itching    Nuts Tree nuts   Peanut-Containing Drug Products Anaphylaxis    Allergy to all nuts    Shellfish Allergy Anaphylaxis   Ciprofibrate Hives   Penicillins Hives    Has patient had a PCN reaction causing immediate rash, facial/tongue/throat swelling, SOB or lightheadedness with hypotension: Yes Has patient had a PCN reaction causing severe rash involving mucus membranes or skin necrosis: No Has patient had a PCN reaction that required hospitalization No Has patient had a PCN reaction occurring within the last 10 years: No If all of the above answers are NO, then may proceed with Cephalosporin use.    Chocolate Itching   Wheat Itching    Current Outpatient Medications on File Prior to Visit  Medication Sig Dispense Refill   albuterol  (PROVENTIL ) (2.5 MG/3ML) 0.083% nebulizer solution Take 3 mLs (2.5 mg total) by nebulization every 4 (four) hours as needed for wheezing or shortness of breath. 75 mL 0   albuterol  (VENTOLIN  HFA) 108 (90 Base) MCG/ACT inhaler Inhale 2 puffs into the lungs every 4 (four) hours as needed for wheezing or shortness of breath. 18 g 1   buPROPion  (WELLBUTRIN  XL) 300 MG 24 hr tablet TAKE ONE TABLET BY MOUTH ONCE A DAY 90 tablet 0    clotrimazole -betamethasone  (LOTRISONE ) cream Apply topically.     diphenhydrAMINE  (BENADRYL ) 25 MG tablet Take 25 mg by mouth as needed for allergies.      EPINEPHrine  0.3 mg/0.3 mL IJ SOAJ  injection Inject 0.3 mg into the muscle as needed for anaphylaxis. 2 each 1   famotidine  (PEPCID ) 20 MG tablet Take 1 tablet (20 mg total) by mouth 2 (two) times daily. 60 tablet 5   fluticasone  (FLONASE ) 50 MCG/ACT nasal spray Place 2 sprays into both nostrils daily. 16 g 6   levocetirizine (XYZAL ) 5 MG tablet Take 1 tablet (5 mg total) by mouth in the morning and at bedtime. 120 tablet 5   metFORMIN  (GLUCOPHAGE -XR) 500 MG 24 hr tablet TAKE 2 TABLETS BY MOUTH WITH DINNER 60 tablet 0   montelukast  (SINGULAIR ) 10 MG tablet Take 1 tablet (10 mg total) by mouth at bedtime. 30 tablet 5   Olopatadine  HCl (PATADAY ) 0.2 % SOLN 1 drop each eye daily as needed for itchy/watery eyes. 2.5 mL 3   omeprazole  (PRILOSEC) 40 MG capsule TAKE ONE CAPSULE BY MOUTH ONCE A DAY BEFORE BREAKFAST 90 capsule 3   sertraline  (ZOLOFT ) 50 MG tablet Take 1 tablet (50 mg total) by mouth daily. 30 tablet 3   topiramate  (TOPAMAX ) 100 MG tablet Take 1 tablet (100 mg total) by mouth daily before supper. 30 tablet 0   traZODone  (DESYREL ) 50 MG tablet TAKE ONE OR TWO TABLETS BY MOUTH AT BEDTIME AS NEEDED FOR SLEEP (Patient taking differently: Take 50 mg by mouth as needed.) 60 tablet 6   Vitamin D , Ergocalciferol , (DRISDOL ) 1.25 MG (50000 UNIT) CAPS capsule Take 1 capsule (50,000 Units total) by mouth every 3 (three) days. 12 capsule 0   Current Facility-Administered Medications on File Prior to Visit  Medication Dose Route Frequency Provider Last Rate Last Admin   omalizumab  (XOLAIR ) prefilled syringe 300 mg  300 mg Subcutaneous Q28 days Jeneal Danita Macintosh, MD   300 mg at 02/14/23 1629    BP 110/60   Pulse 72   Temp 98.4 F (36.9 C) (Oral)   Ht 5' 9 (1.753 m)   Wt (!) 323 lb (146.5 kg)   LMP 02/24/2024 (Approximate)   SpO2 98%    BMI 47.70 kg/m       Objective:   Physical Exam Vitals and nursing note reviewed.  Cardiovascular:     Rate and Rhythm: Normal rate and regular rhythm.     Pulses: Normal pulses.     Heart sounds: Normal heart sounds.  Pulmonary:     Breath sounds: Normal breath sounds.  Musculoskeletal:        General: Normal range of motion.  Skin:    General: Skin is warm and dry.     Capillary Refill: Capillary refill takes less than 2 seconds.  Neurological:     General: No focal deficit present.  Psychiatric:        Mood and Affect: Mood normal.        Behavior: Behavior normal.        Thought Content: Thought content normal.        Judgment: Judgment normal.        Assessment & Plan:   Assessment and Plan    Migraine with status migrainosus Chronic migraine with status migrainosus for over a week. Topamax  provides some nocturnal relief.  - Administered Toradol  and Depo Medrol  injection for immediate relief. - Sample of Nurtec ODT given to start at the onset of migraine headache      Samples of Nurtec ODT were given to the patient, quantity 4, Lot Number [id number] VEAR Darleene Shape, NP  I personally spent a total of 31 minutes in the care of the  patient today including preparing to see the patient, getting/reviewing separately obtained history, performing a medically appropriate exam/evaluation, counseling and educating, placing orders, documenting clinical information in the EHR, and listening .

## 2024-03-01 ENCOUNTER — Ambulatory Visit: Admitting: Physical Therapy

## 2024-03-01 DIAGNOSIS — M6281 Muscle weakness (generalized): Secondary | ICD-10-CM

## 2024-03-01 DIAGNOSIS — R293 Abnormal posture: Secondary | ICD-10-CM

## 2024-03-01 DIAGNOSIS — R279 Unspecified lack of coordination: Secondary | ICD-10-CM

## 2024-03-01 NOTE — Therapy (Signed)
 OUTPATIENT PHYSICAL THERAPY FEMALE PELVIC TREATMENT   Patient Name: Sydney Bradley MRN: [id number] DOB:Feb 18, 1981, 43 y.o., female Today's Date: 03/01/2024  END OF SESSION:  PT End of Session - 03/01/24 1440     Visit Number 5    Date for Recertification  07/09/24    Authorization Type [employer]    PT Start Time 1232    PT Stop Time 1312    PT Time Calculation (min) 40 min    Activity Tolerance Patient tolerated treatment well    Behavior During Therapy WFL for tasks assessed/performed            Past Medical History:  Diagnosis Date   Allergy    Anxiety    Arthritis    L5 Back - no meds - otc prn   Asthma    Back pain    Constipation    Crohn's disease of ileum (HCC)    Fatty liver    GERD (gastroesophageal reflux disease)    High blood pressure    Hypertension    no meds x 3 yrs   IBS (irritable bowel syndrome)    Joint pain    Morbid obesity with BMI of 45.0-49.9, adult (HCC)    Recurrent upper respiratory infection (URI)    Sacral fracture (HCC)    Urticaria    Vitamin B 12 deficiency    Vitamin D  deficiency    Past Surgical History:  Procedure Laterality Date   ADENOIDECTOMY     COLONOSCOPY     DILATION AND EVACUATION  02/25/2012   Procedure: DILATATION AND EVACUATION;  Surgeon: Rosaline DELENA Luna, MD;  Location: [facility];  Service: Gynecology;  Laterality: N/A;   DILATION AND EVACUATION N/A 03/01/2013   Procedure: DILATATION AND EVACUATION;  Surgeon: Oneil FORBES Piety, MD;  Location: [facility];  Service: Gynecology;  Laterality: N/A;   TONSILLECTOMY     TONSILLECTOMY AND ADENOIDECTOMY  2007   Patient Active Problem List   Diagnosis Date Noted   Prediabetes 02/09/2023   Other fatigue 02/08/2023   SOB (shortness of breath) on exertion 02/08/2023   Vitamin D  deficiency 02/08/2023   Polyarthralgia 02/08/2023   Health care maintenance 02/08/2023   Elevated glucose 02/08/2023   Elevated blood pressure reading 02/08/2023   Class 2 severe obesity due to  excess calories with serious comorbidity in adult 02/08/2023   Plantar fasciitis 07/28/2018   Asthma 07/25/2018   BMI 45.0-49.9, adult (HCC) 12/13/2017   Long-term use of immunosuppressant medication - Cimzia  07/01/2017   Crohn's ileitis, with diarrhea (HCC) 12/10/2016   Gastroesophageal reflux disease 12/10/2016   Chronic hypertension complicating or reason for care during pregnancy    Pre-existing hypertension affecting pregnancy in second trimester    Other obesity 06/26/2013   Mild persistent asthma 11/14/2012    PCP: Merna Huxley, NP  REFERRING PROVIDER: Avram Lupita FORBES, MD   REFERRING DIAG: K59.00 (ICD-10-CM) - Constipation, unspecified constipation type  THERAPY DIAG:  Unspecified lack of coordination  Muscle weakness (generalized)  Abnormal posture  Rationale for Evaluation and Treatment: Rehabilitation  ONSET DATE: chronic - as long as she can remember   SUBJECTIVE:  SUBJECTIVE STATEMENT: Was a little sore after exercises last time but loved the exercises.   PAIN: no pain 11/20  Are you having pain? Yes NPRS scale: 1/10 low back pain,  02/15/24: pain level 5/10 due to bad arthritis day Pain location: low back/abdominal pain  Pain type: pressure and cramping Pain description: intermittent   Aggravating factors: constipation Relieving factors: bowel movements   PRECAUTIONS: None  RED FLAGS: None   WEIGHT BEARING RESTRICTIONS: No  FALLS:  Has patient fallen in last 6 months? 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   ACTIVITY LEVEL : she wis working on walking and going to the gym  PLOF: Independent  PATIENT GOALS: improve bowel movements   PERTINENT HISTORY:  D&C x 2, Chron's, GERD, asthma, back pain, IBS, sacral fracture and L5 Sexual abuse:  No  BOWEL MOVEMENT: Pain with bowel movement: Yes - sometimes  Type of bowel movement:Type (Bristol Stool Scale) 4-7, Frequency 3x/week, Strain yes, and Splinting pushes on abdomen; not complete and can't get clean Fully empty rectum: No Leakage: Yes: cannot get clean after but does not leak at other times  Pads: Yes: panty liner Fiber supplement/laxative Yes - stool softener, miralax  URINATION: Pain with urination: No Fully empty bladder: No Stream: Strong Urgency: Yes  Frequency: 3x/day; occasionally 1xnight Fluid Intake: working on drinking more water - out on FMLA currently so getting more Leakage: none Pads: Yes: panty liner  INTERCOURSE:  Ability to have vaginal penetration Yes  Pain with intercourse: none DrynessNo Climax: harder to achieve  [place] [employer] Scale: 0/3 Lubricant: none   PREGNANCY: Vaginal deliveries 1 Tearing Yes:   Episiotomy No C-section deliveries 0 Currently pregnant No  PROLAPSE: None   OBJECTIVE:  Note: Objective measures were completed at Evaluation unless otherwise noted.  01/23/24 PATIENT SURVEYS:   PFIQ-7: 57 (bowel)  COGNITION: Overall cognitive status: Within functional limits for tasks assessed     SENSATION: Light touch: Appears intact   FUNCTIONAL TESTS:  Squat: use of hands on thighs for support Single leg stance:  Rt: pelvic drop  Lt: pelvic drop Curl-up test: abdominal distortion    GAIT: Assistive device utilized: None Comments: decreased trunk rotation and increased anterior pelvic tilt  POSTURE: rounded shoulders, forward head, increased lumbar lordosis, increased thoracic kyphosis, anterior pelvic tilt, and scoliosis with Rt thoracic curvature   LUMBARAROM/PROM:  A/PROM A/PROM  Eval (% available)  Flexion 50 pain  Extension 25, pain  Right lateral flexion 25 pain  Left lateral flexion 25 pain  Right rotation 25 pain  Left rotation 25 paim   (Blank rows = not tested)  PALPATION:   General:  tightness in lower thoracic and lumbar paraspinals, tenderness throughout low back, sacrum and coccyx  Pelvic Alignment: anterior pelvic tilt  Abdominal: tenderness throughout upper and lower, more notable lower abdominal tenderness and report of pressure; tightness throughout abdominals                 External Perineal Exam: WNL                             Internal Pelvic Floor: non-tender, anterior vaginal wall laxity palpable; external anal sphincter dyssynergia with paradoxical contraction and no appropriate increase in intra-rectal pressure  Patient confirms identification and approves PT to assess internal pelvic floor and treatment Yes  PELVIC MMT:   MMT eval 02/15/24  Vaginal 3/5, 6 seconds, 7 repeat contractions   Internal Anal  Sphincter 1/5 4/5  External Anal Sphincter 1/5 4/5  Puborectalis 1/5 4/5  Diastasis Recti 3 finger widths    (Blank rows = not tested)        TONE: WNL  PROLAPSE: Grade 2 anterior vaginal wall laxity. Grade 1 posterior vaginal wall laxity  TODAY'S TREATMENT:                                                                                                                              DATE:   02/15/24 Manual: Soft tissue mobilization: Manual work to the perineal body with gloved hand in right sidely Internal pelvic floor techniques: No emotional/communication barriers or cognitive limitation. Patient is motivated to learn. Patient understands and agrees with treatment goals and plan. PT explains patient will be examined in standing, sitting, and lying down to see how their muscles and joints work. When they are ready, they will be asked to remove their underwear so PT can examine their perineum. The patient is also given the option of providing their own chaperone as one is not provided in our facility. The patient also has the right and is explained the right to defer or refuse any part of the evaluation or treatment including the internal exam. With  the patient's consent, PT will use one gloved finger to gently assess the muscles of the pelvic floor, seeing how well it contracts and relaxes and if there is muscle symmetry. After, the patient will get dressed and PT and patient will discuss exam findings and plan of care. PT and patient discuss plan of care, schedule, attendance policy and HEP activities.  Therapist gloved finger in the rectum and other finger on the anterior left perineal body working on elongating the tissue then done the same along the anococcygeal ligament Neuromuscular re-education: Pelvic floor contraction training: Therapist gloved finger in the rectum working on contraction and pulling the puborectalis forward then working on pushing the therapist finger out of the rectum with diaphragmatic breathing then breathing to generate force to push finger out while laying on right side Self-care: Educated patient to not sit longer than 10 minutes. She is to leave then come back to the commode when she has the urge or after a meal. Educated patient on how to chew her food well to improve the gastrocolic reflex.  Discussed her goals to see if she is meeting them and reviewed them.  Discussed with patient to eat her fiber for fiber tracking and she is trying to incorporate more fiber.    03/01/24: Patient consented to internal pelvic floor treatment rectally this date and found to have continued tension at posterior pelvic floor at coccyx, puborectalis, anococcygeal ligament. Manual completed throughout these areas, with STM and breathing mechanics cued throughout for relaxation.   PATIENT EDUCATION:  Education details: [id number] Person educated: Patient Education method: Explanation, Demonstration, Tactile cues, Verbal cues, and Handouts Education comprehension: verbalized understanding  HOME EXERCISE PROGRAM: [id number]  ASSESSMENT:  CLINICAL  IMPRESSION: Patient is a 43 y.o. female who was seen today for physical therapy  treatment for chronic constipation, abdominal pain, low back pain, and urinary urgency. Pt reports great improvement overall now. Does still have some bowel looseness and intermittent constipation but improving greatly. Tolerated session very well, did have TTP and tension rectally however great released noted. Pt reports she had no pain during or at end of session and felt much more mobile at end. She will continue to benefit from skilled PT intervention in order to improve constipation, decrease urinary urgency, decrease pain, address impairments, and improve quality of life.   OBJECTIVE IMPAIRMENTS: decreased activity tolerance, decreased coordination, decreased endurance, decreased mobility, decreased ROM, decreased strength, increased fascial restrictions, increased muscle spasms, impaired flexibility, impaired tone, improper body mechanics, postural dysfunction, and pain.   ACTIVITY LIMITATIONS: bending, standing, squatting, continence, and urinary urgency, constipation   PARTICIPATION LIMITATIONS: cleaning, laundry, driving, community activity, occupation, and exercise  PERSONAL FACTORS: 3+ comorbidities: medical history are also affecting patient's functional outcome.   REHAB POTENTIAL: Good  CLINICAL DECISION MAKING: Evolving/moderate complexity  EVALUATION COMPLEXITY: Moderate   GOALS: Goals reviewed with patient? Yes  SHORT TERM GOALS: Target date: 02/20/2024   Pt will be independent with HEP in order to improve activity tolerance.   Baseline: Goal status: MET 02/15/24  2.  Pt will increase external anal sphincter strength and coordination to 2/5 with appropriate contraction when she thinks she is squeezing in order to improve bowel evacuation and fecal smearing to help with personal hygiene.   Baseline: cannot empty and has smearing after bowel movements; 1/5 strength Goal status: Met 02/15/24  3.  Pt will generate appropriate increase in intra-rectal pressure with pushing  during bowel movements in order to completely empty to improve personal hygiene  Baseline: no generation of intra-rectal pressure with pushing Goal status: Met 02/15/24  4.  Pt will increase all impaired lumbar A/ROM by 25% without pain in order to improve length tension relationship in pelvic floor muscles, improving bowel movements.   Baseline: flexion 50% and all others reduced by 75% Goal status: MET 02/15/24  5.  Pt will be independent with use of squatty potty, relaxed toileting mechanics, and improved bowel movement techniques in order to increase ease of bowel movements and complete evacuation.   Baseline:  Goal status: MET 02/15/24  6.  Pt will be able to teach back and utilize urge suppression technique in order to help reduce number of trips to the bathroom.    Baseline:  Goal status: MET 02/15/24  LONG TERM GOALS: Target date: 07/09/2024  Pt will be independent with advanced HEP in order to improve activity tolerance.   Baseline:  Goal status: INITIAL  2.  Pt will improve PFIQ-7 score to less than 30 in order to demonstrate improved quality of life. Baseline: 57 Goal status: INITIAL  3.  Pt will have 5 or more bowel movements a week without straining and complete emptying in order to improve low back and abdominal pain.  Baseline: 7-10/10 pain; 3x/week Goal status: INITIAL  4.  Patient will report 75% improvement in low back/abdominal pain in order to increase activity tolerance.   Baseline: 7-10/10 Goal status: INITIAL  5.  Pt will increase external anal sphincter strength and coordination to 2/5 with appropriate contraction when she thinks she is squeezing in order to improve bowel evacuation and fecal smearing to help with personal hygiene.   Baseline: 1/5 Goal status: INITIAL   PLAN:  PT FREQUENCY: 1-2x/week  PT DURATION: 12 visits    PLANNED INTERVENTIONS: 97164- PT Re-evaluation, 97110-Therapeutic exercises, 97530- Therapeutic activity, 97112-  Neuromuscular re-education, [phone]- Self Care, 02859- Manual therapy, [phone]- Gait training, [phone]- Aquatic Therapy, [phone]- Electrical stimulation (unattended), [phone]- Traction (mechanical), [id number]- Ionotophoresis 4mg /ml Dexamethasone , 79439 (1-2 muscles), 20561 (3+ muscles)- Dry Needling, Patient/Family education, Balance training, Taping, Joint mobilization, Joint manipulation, Spinal manipulation, Spinal mobilization, Scar mobilization, Vestibular training, Cryotherapy, Moist heat, and Biofeedback  PLAN FOR NEXT SESSION: Review appropriate toilet mechanics;  teach self bowel mobilization techniques to improve motility; manual techniques to abdomen and low back/sacrum/coccyx; mobility/stretches; strengthening; pelvic floor muscle/external anal sphincter contraction training; pressure management  Darryle Navy, PT, DPT 11/20/254:21 PM  [facility] [address], [address] [place], [place] [address] Phone # [phone] Fax [phone]

## 2024-03-02 ENCOUNTER — Encounter: Payer: Self-pay | Admitting: Adult Health

## 2024-03-06 ENCOUNTER — Encounter: Admitting: Physical Therapy

## 2024-03-10 ENCOUNTER — Other Ambulatory Visit (INDEPENDENT_AMBULATORY_CARE_PROVIDER_SITE_OTHER): Payer: Self-pay | Admitting: Adult Health

## 2024-03-10 DIAGNOSIS — F5089 Other specified eating disorder: Secondary | ICD-10-CM

## 2024-03-12 ENCOUNTER — Ambulatory Visit: Admitting: Physical Therapy

## 2024-03-13 ENCOUNTER — Encounter (INDEPENDENT_AMBULATORY_CARE_PROVIDER_SITE_OTHER): Payer: Self-pay | Admitting: Adult Health

## 2024-03-13 ENCOUNTER — Ambulatory Visit (INDEPENDENT_AMBULATORY_CARE_PROVIDER_SITE_OTHER): Admitting: Adult Health

## 2024-03-13 VITALS — BP 92/67 | HR 85 | Temp 98.4°F | Ht 69.0 in | Wt 319.0 lb

## 2024-03-13 DIAGNOSIS — R632 Polyphagia: Secondary | ICD-10-CM

## 2024-03-13 DIAGNOSIS — R7303 Prediabetes: Secondary | ICD-10-CM

## 2024-03-13 DIAGNOSIS — E559 Vitamin D deficiency, unspecified: Secondary | ICD-10-CM | POA: Diagnosis not present

## 2024-03-13 DIAGNOSIS — F5089 Other specified eating disorder: Secondary | ICD-10-CM | POA: Diagnosis not present

## 2024-03-13 DIAGNOSIS — G43811 Other migraine, intractable, with status migrainosus: Secondary | ICD-10-CM

## 2024-03-13 DIAGNOSIS — E669 Obesity, unspecified: Secondary | ICD-10-CM

## 2024-03-13 DIAGNOSIS — Z6841 Body Mass Index (BMI) 40.0 and over, adult: Secondary | ICD-10-CM

## 2024-03-13 MED ORDER — TOPIRAMATE 100 MG PO TABS: 100.0000 mg | ORAL_TABLET | Freq: Every day | ORAL | 0 refills | Status: DC

## 2024-03-13 MED ORDER — METFORMIN HCL ER 500 MG PO TB24: ORAL_TABLET | ORAL | 0 refills | Status: DC

## 2024-03-13 NOTE — Progress Notes (Signed)
 WEIGHT SUMMARY AND BIOMETRICS  Vitals Temp: 98.4 F (36.9 C) BP: 92/67 Pulse Rate: 85 SpO2: 99 %   Anthropometric Measurements Height: 5' 9 (1.753 m) Weight: (!) 319 lb (144.7 kg) BMI (Calculated): 47.09 Weight at Last Visit: 321lb Weight Lost Since Last Visit: 2lb Weight Gained Since Last Visit: 0lb Starting Weight: 343lb Total Weight Loss (lbs): 24 lb (10.9 kg)   Body Composition  Body Fat %: 53.5 % Fat Mass (lbs): 170.8 lbs Muscle Mass (lbs): 141.2 lbs Total Body Water (lbs): 99.6 lbs Visceral Fat Rating : 18   Other Clinical Data RMR: [id number] Fasting: No Labs: no Today's Visit #: 14    Chief Complaint:   OBESITY Sydney Bradley is here to discuss her progress with her obesity treatment plan.  She is on the the Category 3 Plan and the Category 4 Plan and states she is following her eating plan approximately 50 % of the time.  She states she is exercising Exercise App/Daily Walking 30 minutes 2 times per week.   Interim History:  She has increased water intake and significantly reduced soda, sweet tea, lemonade. She enjoyed one soda over Thanksgiving.  She is very pleased to have lost weight over this holiday season.  She reports increased stress the last month, r/t: Paternal Uncle passed away Her grandmother's home was recently sold.  She remains out of work on [employer]. Her PCP recently completed her Short Term Disability paperwork.  She has been increasing regular exercises via Chair Exercise via smart app   Subjective:   1. Vitamin D  deficiency  Latest Reference Range & Units 01/16/24 11:08  Vitamin D , 25-Hydroxy 30.0 - 100.0 ng/mL 24.0 (L)  (L): Data is abnormally low  02/13/2024 Ergocalciferol  was increased from weekly to twice weekly due to subtherapeutic Vit D Level  2. Polyphagia She endorses stable appetite  3. Prediabetes She endorses stable appetite  Latest Reference Range & Units 01/16/24 11:08  eGFR >59 mL/min/1.73 71   She is on  Metformin  XR 500mg -2 tabs with dinner She reports tolerating well.  4. Other migraine with status migrainosus, intractable She reports HA activity during mid-menses She treats acute sx's with rest and OTC Analgesics (First- Acetaminophen , Second Line- NSAIDs)  5. Other disorder of eating BP stable at OV She endorses stable appetite She denies word finding problems or paresthesias She is on nightly Topamax  100mg  Counseled on pregnancy prevention while taking Topamax   Assessment/Plan:   1. Vitamin D  deficiency Monitor Labs  2. Polyphagia Continue to increase lean protein intake and regular exercise  3. Prediabetes Continue to increase lean protein intake and regular exercise Refill metFORMIN  (GLUCOPHAGE -XR) 500 MG 24 hr tablet TAKE 2 TABLETS BY MOUTH WITH DINNER Dispense: 60 tablet, Refills: 0 ordered   4. Other migraine with status migrainosus, intractable (Primary) Remain well hydrated and well rested Continue to increase lean protein intake and regular exercise Refill - topiramate  (TOPAMAX ) 100 MG tablet; Take 1 tablet (100 mg total) by mouth daily before supper.  Dispense: 30 tablet; Refill: 0 Counseled on pregnancy prevention while taking Topamax  Pt verbalized understanding and agreement  5. Other disorder of eating Continue to increase lean protein intake and regular exercise Refill - topiramate  (TOPAMAX ) 100 MG tablet; Take 1 tablet (100 mg total) by mouth daily before supper.  Dispense: 30 tablet; Refill: 0 Counseled on pregnancy prevention while taking Topamax  Pt verbalized understanding and agreement  6. BMI 45.0-49.9, adult (HCC), CURRENT BMI 47.2  Sydney Bradley is currently in the action stage  of change. As such, her goal is to continue with weight loss efforts. She has agreed to the Category 3 Plan and the Category 4 Plan.   Exercise goals: For substantial health benefits, adults should do at least 150 minutes (2 hours and 30 minutes) a week of moderate-intensity, or  75 minutes (1 hour and 15 minutes) a week of vigorous-intensity aerobic physical activity, or an equivalent combination of moderate- and vigorous-intensity aerobic activity. Aerobic activity should be performed in episodes of at least 10 minutes, and preferably, it should be spread throughout the week.  Behavioral modification strategies: increasing lean protein intake, decreasing simple carbohydrates, increasing vegetables, increasing water intake, no skipping meals, meal planning and cooking strategies, keeping healthy foods in the home, ways to avoid boredom eating, and planning for success.  Sydney Bradley has agreed to follow-up with our clinic in 4 weeks. She was informed of the importance of frequent follow-up visits to maximize her success with intensive lifestyle modifications for her multiple health conditions.   Objective:   Blood pressure 92/67, pulse 85, temperature 98.4 F (36.9 C), height 5' 9 (1.753 m), weight (!) 319 lb (144.7 kg), last menstrual period 02/24/2024, SpO2 99%. Body mass index is 47.11 kg/m.  General: Cooperative, alert, well developed, in no acute distress. HEENT: Conjunctivae and lids unremarkable. Cardiovascular: Regular rhythm.  Lungs: Normal work of breathing. Neurologic: No focal deficits.   Lab Results  Component Value Date   CREATININE 1.00 01/16/2024   BUN 8 01/16/2024   NA 138 01/16/2024   K 4.1 01/16/2024   CL 107 (H) 01/16/2024   CO2 18 (L) 01/16/2024   Lab Results  Component Value Date   ALT 12 01/16/2024   AST 14 01/16/2024   ALKPHOS 75 01/16/2024   BILITOT 0.4 01/16/2024   Lab Results  Component Value Date   HGBA1C 5.5 01/16/2024   HGBA1C 5.4 11/09/2023   HGBA1C 5.7 (H) 02/08/2023   HGBA1C 5.6 08/14/2019   HGBA1C 5.8 11/11/2017   Lab Results  Component Value Date   INSULIN  20.1 01/16/2024   INSULIN  20.0 11/09/2023   INSULIN  12.3 02/08/2023   Lab Results  Component Value Date   TSH 2.980 01/16/2024   Lab Results  Component  Value Date   CHOL 151 02/08/2023   HDL 44 02/08/2023   LDLCALC 90 02/08/2023   TRIG 89 02/08/2023   CHOLHDL 3 08/14/2019   Lab Results  Component Value Date   VD25OH 24.0 (L) 01/16/2024   VD25OH 19.9 (L) 11/09/2023   VD25OH 21.1 (L) 02/08/2023   Lab Results  Component Value Date   WBC 5.6 09/26/2023   HGB 12.8 09/26/2023   HCT 39.5 09/26/2023   MCV 82.6 09/26/2023   PLT 249.0 09/26/2023   Lab Results  Component Value Date   IRON 74 12/18/2021   TIBC 289.8 12/18/2021   FERRITIN 77.6 12/18/2021   Attestation Statements:   Reviewed by clinician on day of visit: allergies, medications, problem list, medical history, surgical history, family history, social history, and previous encounter notes.  I have reviewed the above documentation for accuracy and completeness, and I agree with the above. -  Olubunmi Rothenberger d. Saki Legore, NP-C

## 2024-03-14 ENCOUNTER — Ambulatory Visit (HOSPITAL_COMMUNITY): Payer: Self-pay

## 2024-03-14 VITALS — BP 115/82 | HR 90 | Ht 69.0 in | Wt 319.0 lb

## 2024-03-14 DIAGNOSIS — F321 Major depressive disorder, single episode, moderate: Secondary | ICD-10-CM | POA: Diagnosis not present

## 2024-03-14 DIAGNOSIS — F411 Generalized anxiety disorder: Secondary | ICD-10-CM

## 2024-03-14 MED ORDER — HYDROXYZINE HCL 10 MG PO TABS: 10.0000 mg | ORAL_TABLET | Freq: Three times a day (TID) | ORAL | 1 refills | Status: DC | PRN

## 2024-03-15 ENCOUNTER — Encounter: Payer: Self-pay | Admitting: Physical Therapy

## 2024-03-15 ENCOUNTER — Ambulatory Visit: Admitting: Physical Therapy

## 2024-03-15 DIAGNOSIS — M6281 Muscle weakness (generalized): Secondary | ICD-10-CM | POA: Insufficient documentation

## 2024-03-15 DIAGNOSIS — R293 Abnormal posture: Secondary | ICD-10-CM | POA: Insufficient documentation

## 2024-03-15 DIAGNOSIS — R1030 Lower abdominal pain, unspecified: Secondary | ICD-10-CM | POA: Diagnosis present

## 2024-03-15 DIAGNOSIS — M5459 Other low back pain: Secondary | ICD-10-CM | POA: Diagnosis present

## 2024-03-15 DIAGNOSIS — R279 Unspecified lack of coordination: Secondary | ICD-10-CM | POA: Diagnosis present

## 2024-03-15 NOTE — Therapy (Signed)
 OUTPATIENT PHYSICAL THERAPY FEMALE PELVIC TREATMENT   Patient Name: Sydney Bradley MRN: [id number] DOB:19-Oct-1980, 43 y.o., female Today's Date: 03/15/2024  END OF SESSION:      Past Medical History:  Diagnosis Date   Allergy    Anxiety    Arthritis    L5 Back - no meds - otc prn   Asthma    Back pain    Constipation    Crohn's disease of ileum (HCC)    Fatty liver    GERD (gastroesophageal reflux disease)    High blood pressure    Hypertension    no meds x 3 yrs   IBS (irritable bowel syndrome)    Joint pain    Morbid obesity with BMI of 45.0-49.9, adult (HCC)    Recurrent upper respiratory infection (URI)    Sacral fracture (HCC)    Urticaria    Vitamin B 12 deficiency    Vitamin D  deficiency    Past Surgical History:  Procedure Laterality Date   ADENOIDECTOMY     COLONOSCOPY     DILATION AND EVACUATION  02/25/2012   Procedure: DILATATION AND EVACUATION;  Surgeon: Rosaline DELENA Luna, MD;  Location: [facility];  Service: Gynecology;  Laterality: N/A;   DILATION AND EVACUATION N/A 03/01/2013   Procedure: DILATATION AND EVACUATION;  Surgeon: Oneil FORBES Piety, MD;  Location: [facility];  Service: Gynecology;  Laterality: N/A;   TONSILLECTOMY     TONSILLECTOMY AND ADENOIDECTOMY  2007   Patient Active Problem List   Diagnosis Date Noted   Prediabetes 02/09/2023   Other fatigue 02/08/2023   SOB (shortness of breath) on exertion 02/08/2023   Vitamin D  deficiency 02/08/2023   Polyarthralgia 02/08/2023   Health care maintenance 02/08/2023   Elevated glucose 02/08/2023   Elevated blood pressure reading 02/08/2023   Class 2 severe obesity due to excess calories with serious comorbidity in adult 02/08/2023   Plantar fasciitis 07/28/2018   Asthma 07/25/2018   BMI 45.0-49.9, adult (HCC) 12/13/2017   Long-term use of immunosuppressant medication - Cimzia  07/01/2017   Crohn's ileitis, with diarrhea (HCC) 12/10/2016   Gastroesophageal reflux disease 12/10/2016   Chronic hypertension  complicating or reason for care during pregnancy    Pre-existing hypertension affecting pregnancy in second trimester    Other obesity 06/26/2013   Mild persistent asthma 11/14/2012    PCP: Merna Huxley, NP  REFERRING PROVIDER: Avram Lupita FORBES, MD   REFERRING DIAG: K59.00 (ICD-10-CM) - Constipation, unspecified constipation type  THERAPY DIAG:  No diagnosis found.  Rationale for Evaluation and Treatment: Rehabilitation  ONSET DATE: chronic - as long as she can remember   SUBJECTIVE:  SUBJECTIVE STATEMENT: Felt ok after last visit, going to the bathroom every morning about, does not sit for too long, does not have urinary triggers so much anymore, seeing improvement so far.  Rectal work has been the most beneficial. Being on FMLA has been helpful to her stomach issues ( [employer] child psychotherapist)  Last visit Was a little sore after exercises last time but loved the exercises.   PAIN: no pain 11/20  Are you having pain? Yes NPRS scale: 1/10 low back pain,  02/15/24: pain level 5/10 due to bad arthritis day Pain location: low back/abdominal pain  Pain type: pressure and cramping Pain description: intermittent   Aggravating factors: constipation Relieving factors: bowel movements   PRECAUTIONS: None  RED FLAGS: None   WEIGHT BEARING RESTRICTIONS: No  FALLS:  Has patient fallen in last 6 months? 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   ACTIVITY LEVEL : she wis working on walking and going to the gym  PLOF: Independent  PATIENT GOALS: improve bowel movements   PERTINENT HISTORY:  D&C x 2, Chron's, GERD, asthma, back pain, IBS, sacral fracture and L5 Sexual abuse: No  BOWEL MOVEMENT: Pain with bowel movement: Yes - sometimes  Type of bowel movement:Type ([place]  Stool Scale) 4-7, Frequency 3x/week, Strain yes, and Splinting pushes on abdomen; not complete and can't get clean Fully empty rectum: No Leakage: Yes: cannot get clean after but does not leak at other times  Pads: Yes: panty liner Fiber supplement/laxative Yes - stool softener, miralax  URINATION: Pain with urination: No Fully empty bladder: No Stream: Strong Urgency: Yes  Frequency: 3x/day; occasionally 1xnight Fluid Intake: working on drinking more water - out on FMLA currently so getting more Leakage: none Pads: Yes: panty liner  INTERCOURSE:  Ability to have vaginal penetration Yes  Pain with intercourse: none DrynessNo Climax: harder to achieve  [place] [employer] Scale: 0/3 Lubricant: none   PREGNANCY: Vaginal deliveries 1 Tearing Yes:   Episiotomy No C-section deliveries 0 Currently pregnant No  PROLAPSE: None   OBJECTIVE:  Note: Objective measures were completed at Evaluation unless otherwise noted.  01/23/24 PATIENT SURVEYS:   PFIQ-7: 57 (bowel)  COGNITION: Overall cognitive status: Within functional limits for tasks assessed     SENSATION: Light touch: Appears intact   FUNCTIONAL TESTS:  Squat: use of hands on thighs for support Single leg stance:  Rt: pelvic drop  Lt: pelvic drop Curl-up test: abdominal distortion    GAIT: Assistive device utilized: None Comments: decreased trunk rotation and increased anterior pelvic tilt  POSTURE: rounded shoulders, forward head, increased lumbar lordosis, increased thoracic kyphosis, anterior pelvic tilt, and scoliosis with Rt thoracic curvature   LUMBARAROM/PROM:  A/PROM A/PROM  Eval (% available)  Flexion 50 pain  Extension 25, pain  Right lateral flexion 25 pain  Left lateral flexion 25 pain  Right rotation 25 pain  Left rotation 25 paim   (Blank rows = not tested)  PALPATION:   General: tightness in lower thoracic and lumbar paraspinals, tenderness throughout low back, sacrum and coccyx  Pelvic  Alignment: anterior pelvic tilt  Abdominal: tenderness throughout upper and lower, more notable lower abdominal tenderness and report of pressure; tightness throughout abdominals                 External Perineal Exam: WNL                             Internal Pelvic Floor:  non-tender, anterior vaginal wall laxity palpable; external anal sphincter dyssynergia with paradoxical contraction and no appropriate increase in intra-rectal pressure  Patient confirms identification and approves PT to assess internal pelvic floor and treatment Yes  PELVIC MMT:   MMT eval 02/15/24  Vaginal 3/5, 6 seconds, 7 repeat contractions   Internal Anal Sphincter 1/5 4/5  External Anal Sphincter 1/5 4/5  Puborectalis 1/5 4/5  Diastasis Recti 3 finger widths    (Blank rows = not tested)        TONE: WNL  PROLAPSE: Grade 2 anterior vaginal wall laxity. Grade 1 posterior vaginal wall laxity  TODAY'S TREATMENT:                                                                                                                              DATE:  03/15/2024 Review of progress, HEP, education on stress and response of body Patient confirms identification and approves physical therapist to perform internal soft tissue work   Muscle energy and rectal work for puborectalis lengthening- patient in sidelying  Child's pose 10 breaths wide knees, forehead supported Open books bilateral 5 reps with diaphragmatic breathing   02/15/24 Manual: Soft tissue mobilization: Manual work to the perineal body with gloved hand in right sidely Internal pelvic floor techniques: No emotional/communication barriers or cognitive limitation. Patient is motivated to learn. Patient understands and agrees with treatment goals and plan. PT explains patient will be examined in standing, sitting, and lying down to see how their muscles and joints work. When they are ready, they will be asked to remove their underwear so PT can examine their  perineum. The patient is also given the option of providing their own chaperone as one is not provided in our facility. The patient also has the right and is explained the right to defer or refuse any part of the evaluation or treatment including the internal exam. With the patient's consent, PT will use one gloved finger to gently assess the muscles of the pelvic floor, seeing how well it contracts and relaxes and if there is muscle symmetry. After, the patient will get dressed and PT and patient will discuss exam findings and plan of care. PT and patient discuss plan of care, schedule, attendance policy and HEP activities.  Therapist gloved finger in the rectum and other finger on the anterior left perineal body working on elongating the tissue then done the same along the anococcygeal ligament Neuromuscular re-education: Pelvic floor contraction training: Therapist gloved finger in the rectum working on contraction and pulling the puborectalis forward then working on pushing the therapist finger out of the rectum with diaphragmatic breathing then breathing to generate force to push finger out while laying on right side Self-care: Educated patient to not sit longer than 10 minutes. She is to leave then come back to the commode when she has the urge or after a meal. Educated patient on how to chew her food well to improve the gastrocolic  reflex.  Discussed her goals to see if she is meeting them and reviewed them.  Discussed with patient to eat her fiber for fiber tracking and she is trying to incorporate more fiber.    03/01/24: Patient consented to internal pelvic floor treatment rectally this date and found to have continued tension at posterior pelvic floor at coccyx, puborectalis, anococcygeal ligament. Manual completed throughout these areas, with STM and breathing mechanics cued throughout for relaxation.   PATIENT EDUCATION:  Education details: [id number] Person educated: Patient Education  method: Explanation, Demonstration, Actor cues, Verbal cues, and Handouts Education comprehension: verbalized understanding  HOME EXERCISE PROGRAM: [id number]  ASSESSMENT:  CLINICAL IMPRESSION: Patient is a 43 y.o. female who was seen today for physical therapy treatment for chronic constipation, abdominal pain, low back pain, and urinary urgency. Pt reports great improvement overall now. Improving greatly overall. Tolerated session very well, did have TTP and tension rectally however great released noted. Pt reports she had no pain during or at end of session.  She will continue to benefit from skilled PT intervention in order to improve constipation, decrease urinary urgency, decrease pain, address impairments, and improve quality of life.   OBJECTIVE IMPAIRMENTS: decreased activity tolerance, decreased coordination, decreased endurance, decreased mobility, decreased ROM, decreased strength, increased fascial restrictions, increased muscle spasms, impaired flexibility, impaired tone, improper body mechanics, postural dysfunction, and pain.   ACTIVITY LIMITATIONS: bending, standing, squatting, continence, and urinary urgency, constipation   PARTICIPATION LIMITATIONS: cleaning, laundry, driving, community activity, occupation, and exercise  PERSONAL FACTORS: 3+ comorbidities: medical history are also affecting patient's functional outcome.   REHAB POTENTIAL: Good  CLINICAL DECISION MAKING: Evolving/moderate complexity  EVALUATION COMPLEXITY: Moderate   GOALS: Goals reviewed with patient? Yes  SHORT TERM GOALS: Target date: 02/20/2024   Pt will be independent with HEP in order to improve activity tolerance.   Baseline: Goal status: MET 02/15/24  2.  Pt will increase external anal sphincter strength and coordination to 2/5 with appropriate contraction when she thinks she is squeezing in order to improve bowel evacuation and fecal smearing to help with personal hygiene.   Baseline:  cannot empty and has smearing after bowel movements; 1/5 strength Goal status: Met 02/15/24  3.  Pt will generate appropriate increase in intra-rectal pressure with pushing during bowel movements in order to completely empty to improve personal hygiene  Baseline: no generation of intra-rectal pressure with pushing Goal status: Met 02/15/24  4.  Pt will increase all impaired lumbar A/ROM by 25% without pain in order to improve length tension relationship in pelvic floor muscles, improving bowel movements.   Baseline: flexion 50% and all others reduced by 75% Goal status: MET 02/15/24  5.  Pt will be independent with use of squatty potty, relaxed toileting mechanics, and improved bowel movement techniques in order to increase ease of bowel movements and complete evacuation.   Baseline:  Goal status: MET 02/15/24  6.  Pt will be able to teach back and utilize urge suppression technique in order to help reduce number of trips to the bathroom.    Baseline:  Goal status: MET 02/15/24  LONG TERM GOALS: Target date: 07/09/2024  Pt will be independent with advanced HEP in order to improve activity tolerance.   Baseline:  Goal status: INITIAL  2.  Pt will improve PFIQ-7 score to less than 30 in order to demonstrate improved quality of life. Baseline: 57 Goal status: INITIAL  3.  Pt will have 5 or more bowel movements a week without straining  and complete emptying in order to improve low back and abdominal pain.  Baseline: 7-10/10 pain; 3x/week Goal status: INITIAL  4.  Patient will report 75% improvement in low back/abdominal pain in order to increase activity tolerance.   Baseline: 7-10/10 Goal status: INITIAL  5.  Pt will increase external anal sphincter strength and coordination to 2/5 with appropriate contraction when she thinks she is squeezing in order to improve bowel evacuation and fecal smearing to help with personal hygiene.   Baseline: 1/5 Goal status:  INITIAL   PLAN:  PT FREQUENCY: 1-2x/week  PT DURATION: 12 visits    PLANNED INTERVENTIONS: 97164- PT Re-evaluation, 97110-Therapeutic exercises, 97530- Therapeutic activity, 97112- Neuromuscular re-education, 97535- Self Care, [id number]- Manual therapy, [phone]- Gait training, [phone]- Aquatic Therapy, [phone]- Electrical stimulation (unattended), [phone]- Traction (mechanical), [id number]- Ionotophoresis 4mg /ml Dexamethasone , 79439 (1-2 muscles), 20561 (3+ muscles)- Dry Needling, Patient/Family education, Balance training, Taping, Joint mobilization, Joint manipulation, Spinal manipulation, Spinal mobilization, Scar mobilization, Vestibular training, Cryotherapy, Moist heat, and Biofeedback  PLAN FOR NEXT SESSION: Review appropriate toilet mechanics;  teach self bowel mobilization techniques to improve motility; manual techniques to abdomen and low back/sacrum/coccyx; mobility/stretches; strengthening; pelvic floor muscle/external anal sphincter contraction training; pressure management  Sherissa Tenenbaum, PT, DPT 12/04/251:59 PM  [facility] [address] [place], [place] [address] Phone # [phone] Fax [phone]

## 2024-03-17 ENCOUNTER — Other Ambulatory Visit: Payer: Self-pay | Admitting: Adult Health

## 2024-03-17 DIAGNOSIS — F419 Anxiety disorder, unspecified: Secondary | ICD-10-CM

## 2024-03-19 ENCOUNTER — Ambulatory Visit: Admitting: Physical Therapy

## 2024-03-19 DIAGNOSIS — R279 Unspecified lack of coordination: Secondary | ICD-10-CM

## 2024-03-19 DIAGNOSIS — R293 Abnormal posture: Secondary | ICD-10-CM

## 2024-03-19 DIAGNOSIS — M6281 Muscle weakness (generalized): Secondary | ICD-10-CM

## 2024-03-19 NOTE — Therapy (Signed)
 OUTPATIENT PHYSICAL THERAPY FEMALE PELVIC TREATMENT   Patient Name: Sydney Bradley MRN: [id number] DOB:[birth year], 43 y.o., female Today's Date: 03/19/2024  END OF SESSION:  PT End of Session - 03/19/24 1027     Visit Number 7    Date for Recertification  07/09/24    Authorization Type [employer]    PT Start Time 1024   arrival   PT Stop Time 1054    PT Time Calculation (min) 30 min    Activity Tolerance Patient tolerated treatment well    Behavior During Therapy WFL for tasks assessed/performed             Past Medical History:  Diagnosis Date   Allergy    Anxiety    Arthritis    L5 Back - no meds - otc prn   Asthma    Back pain    Constipation    Crohn's disease of ileum (HCC)    Fatty liver    GERD (gastroesophageal reflux disease)    High blood pressure    Hypertension    no meds x 3 yrs   IBS (irritable bowel syndrome)    Joint pain    Morbid obesity with BMI of 45.0-49.9, adult (HCC)    Recurrent upper respiratory infection (URI)    Sacral fracture (HCC)    Urticaria    Vitamin B 12 deficiency    Vitamin D  deficiency    Past Surgical History:  Procedure Laterality Date   ADENOIDECTOMY     COLONOSCOPY     DILATION AND EVACUATION  02/25/2012   Procedure: DILATATION AND EVACUATION;  Surgeon: Rosaline DELENA Luna, MD;  Location: [facility];  Service: Gynecology;  Laterality: N/A;   DILATION AND EVACUATION N/A 03/01/2013   Procedure: DILATATION AND EVACUATION;  Surgeon: Oneil FORBES Piety, MD;  Location: [facility];  Service: Gynecology;  Laterality: N/A;   TONSILLECTOMY     TONSILLECTOMY AND ADENOIDECTOMY  2007   Patient Active Problem List   Diagnosis Date Noted   Prediabetes 02/09/2023   Other fatigue 02/08/2023   SOB (shortness of breath) on exertion 02/08/2023   Vitamin D  deficiency 02/08/2023   Polyarthralgia 02/08/2023   Health care maintenance 02/08/2023   Elevated glucose 02/08/2023   Elevated blood pressure reading 02/08/2023   Class 2 severe  obesity due to excess calories with serious comorbidity in adult 02/08/2023   Plantar fasciitis 07/28/2018   Asthma 07/25/2018   BMI 45.0-49.9, adult (HCC) 12/13/2017   Long-term use of immunosuppressant medication - Cimzia  07/01/2017   Crohn's ileitis, with diarrhea (HCC) 12/10/2016   Gastroesophageal reflux disease 12/10/2016   Chronic hypertension complicating or reason for care during pregnancy    Pre-existing hypertension affecting pregnancy in second trimester    Other obesity 06/26/2013   Mild persistent asthma 11/14/2012    PCP: Merna Huxley, NP  REFERRING PROVIDER: Avram Lupita FORBES, MD   REFERRING DIAG: K59.00 (ICD-10-CM) - Constipation, unspecified constipation type  THERAPY DIAG:  Unspecified lack of coordination  Muscle weakness (generalized)  Abnormal posture  Rationale for Evaluation and Treatment: Rehabilitation  ONSET DATE: chronic - as long as she can remember   SUBJECTIVE:  SUBJECTIVE STATEMENT: Had a burger from [employer] this weekend and this has caused her to go to the bathroom a lot, type 7. No accidents. Urgent however. Also is having more gas with this. But has noticed most triggers are only diet based. Outside of this is having no urinary incontinence, bowels are moving much more consistently, more routine, and not having to sit on toilet for more than 5-10 mins at most. No pain with intercourse at all now.   Last visit Was a little sore after exercises last time but loved the exercises.   PAIN: no pain 03/19/24  Are you having pain? Yes NPRS scale: 1/10 low back pain,  02/15/24: pain level 5/10 due to bad arthritis day Pain location: low back/abdominal pain  Pain type: pressure and cramping Pain description: intermittent   Aggravating factors:  constipation Relieving factors: bowel movements   PRECAUTIONS: None  RED FLAGS: None   WEIGHT BEARING RESTRICTIONS: No  FALLS:  Has patient fallen in last 6 months? 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   ACTIVITY LEVEL : she wis working on walking and going to the gym  PLOF: Independent  PATIENT GOALS: improve bowel movements   PERTINENT HISTORY:  D&C x 2, Chron's, GERD, asthma, back pain, IBS, sacral fracture and L5 Sexual abuse: No  BOWEL MOVEMENT: Pain with bowel movement: Yes - sometimes  Type of bowel movement:Type (Bristol Stool Scale) 4-7, Frequency 3x/week, Strain yes, and Splinting pushes on abdomen; not complete and can't get clean Fully empty rectum: No Leakage: Yes: cannot get clean after but does not leak at other times  Pads: Yes: panty liner Fiber supplement/laxative Yes - stool softener, miralax  URINATION: Pain with urination: No Fully empty bladder: No Stream: Strong Urgency: Yes  Frequency: 3x/day; occasionally 1xnight Fluid Intake: working on drinking more water - out on FMLA currently so getting more Leakage: none Pads: Yes: panty liner  INTERCOURSE:  Ability to have vaginal penetration Yes  Pain with intercourse: none DrynessNo Climax: harder to achieve  [place] [employer] Scale: 0/3 Lubricant: none   PREGNANCY: Vaginal deliveries 1 Tearing Yes:   Episiotomy No C-section deliveries 0 Currently pregnant No  PROLAPSE: None   OBJECTIVE:  Note: Objective measures were completed at Evaluation unless otherwise noted.  01/23/24 PATIENT SURVEYS:   PFIQ-7: 57 (bowel)  COGNITION: Overall cognitive status: Within functional limits for tasks assessed     SENSATION: Light touch: Appears intact   FUNCTIONAL TESTS:  Squat: use of hands on thighs for support Single leg stance:  Rt: pelvic drop  Lt: pelvic drop Curl-up test: abdominal distortion    GAIT: Assistive device utilized: None Comments: decreased trunk  rotation and increased anterior pelvic tilt  POSTURE: rounded shoulders, forward head, increased lumbar lordosis, increased thoracic kyphosis, anterior pelvic tilt, and scoliosis with Rt thoracic curvature   LUMBARAROM/PROM:  A/PROM A/PROM  Eval (% available)  Flexion 50 pain  Extension 25, pain  Right lateral flexion 25 pain  Left lateral flexion 25 pain  Right rotation 25 pain  Left rotation 25 paim   (Blank rows = not tested)  PALPATION:   General: tightness in lower thoracic and lumbar paraspinals, tenderness throughout low back, sacrum and coccyx  Pelvic Alignment: anterior pelvic tilt  Abdominal: tenderness throughout upper and lower, more notable lower abdominal tenderness and report of pressure; tightness throughout abdominals                 External Perineal Exam: WNL  Internal Pelvic Floor: non-tender, anterior vaginal wall laxity palpable; external anal sphincter dyssynergia with paradoxical contraction and no appropriate increase in intra-rectal pressure  Patient confirms identification and approves PT to assess internal pelvic floor and treatment Yes  PELVIC MMT:   MMT eval 02/15/24  Vaginal 3/5, 6 seconds, 7 repeat contractions   Internal Anal Sphincter 1/5 4/5  External Anal Sphincter 1/5 4/5  Puborectalis 1/5 4/5  Diastasis Recti 3 finger widths    (Blank rows = not tested)        TONE: WNL  PROLAPSE: Grade 2 anterior vaginal wall laxity. Grade 1 posterior vaginal wall laxity  TODAY'S TREATMENT:                                                                                                                              DATE:   03/19/24: Manual with pt in supine - abdominal massage for ILU and clockwise and counterclock wise Cues for diaphragmatic breathing and relaxation with good effect  03/15/2024 Review of progress, HEP, education on stress and response of body Patient confirms identification and approves physical  therapist to perform internal soft tissue work   Muscle energy and rectal work for puborectalis lengthening- patient in sidelying  Child's pose 10 breaths wide knees, forehead supported Open books bilateral 5 reps with diaphragmatic breathing   PATIENT EDUCATION:  Education details: [id number] Person educated: Patient Education method: Programmer, Multimedia, Facilities Manager, Actor cues, Verbal cues, and Handouts Education comprehension: verbalized understanding  HOME EXERCISE PROGRAM: [id number]  ASSESSMENT:  CLINICAL IMPRESSION: Patient is a 43 y.o. female who was seen today for physical therapy treatment for chronic constipation, abdominal pain, low back pain, and urinary urgency. Pt reports great improvement overall now. Improving greatly overall. Tolerated session very well, did have TTP and tension rectally however great released noted. Pt reports she had no pain during or at end of session.  She will continue to benefit from skilled PT intervention in order to improve constipation, decrease urinary urgency, decrease pain, address impairments, and improve quality of life.   OBJECTIVE IMPAIRMENTS: decreased activity tolerance, decreased coordination, decreased endurance, decreased mobility, decreased ROM, decreased strength, increased fascial restrictions, increased muscle spasms, impaired flexibility, impaired tone, improper body mechanics, postural dysfunction, and pain.   ACTIVITY LIMITATIONS: bending, standing, squatting, continence, and urinary urgency, constipation   PARTICIPATION LIMITATIONS: cleaning, laundry, driving, community activity, occupation, and exercise  PERSONAL FACTORS: 3+ comorbidities: medical history are also affecting patient's functional outcome.   REHAB POTENTIAL: Good  CLINICAL DECISION MAKING: Evolving/moderate complexity  EVALUATION COMPLEXITY: Moderate   GOALS: Goals reviewed with patient? Yes  SHORT TERM GOALS: Target date: 02/20/2024   Pt will be  independent with HEP in order to improve activity tolerance.   Baseline: Goal status: MET 02/15/24  2.  Pt will increase external anal sphincter strength and coordination to 2/5 with appropriate contraction when she thinks she is squeezing in order to improve bowel evacuation and fecal smearing to help  with personal hygiene.   Baseline: cannot empty and has smearing after bowel movements; 1/5 strength Goal status: Met 02/15/24  3.  Pt will generate appropriate increase in intra-rectal pressure with pushing during bowel movements in order to completely empty to improve personal hygiene  Baseline: no generation of intra-rectal pressure with pushing Goal status: Met 02/15/24  4.  Pt will increase all impaired lumbar A/ROM by 25% without pain in order to improve length tension relationship in pelvic floor muscles, improving bowel movements.   Baseline: flexion 50% and all others reduced by 75% Goal status: MET 02/15/24  5.  Pt will be independent with use of squatty potty, relaxed toileting mechanics, and improved bowel movement techniques in order to increase ease of bowel movements and complete evacuation.   Baseline:  Goal status: MET 02/15/24  6.  Pt will be able to teach back and utilize urge suppression technique in order to help reduce number of trips to the bathroom.    Baseline:  Goal status: MET 02/15/24  LONG TERM GOALS: Target date: 07/09/2024  Pt will be independent with advanced HEP in order to improve activity tolerance.   Baseline:  Goal status:on going 03/19/24   2.  Pt will improve PFIQ-7 score to less than 30 in order to demonstrate improved quality of life. Baseline: 57 Goal status: on going 03/19/24   3.  Pt will have 5 or more bowel movements a week without straining and complete emptying in order to improve low back and abdominal pain.  Baseline: 7-10/10 pain; 3x/week Goal status: on going 03/19/24 - at least every other day if not daily now without pain now  4.   Patient will report 75% improvement in low back/abdominal pain in order to increase activity tolerance.   Baseline: 7-10/10 Goal status:MET 03/19/24   5.  Pt will increase external anal sphincter strength and coordination to 2/5 with appropriate contraction when she thinks she is squeezing in order to improve bowel evacuation and fecal smearing to help with personal hygiene.   Baseline: 1/5 Goal status: on going 03/19/24    PLAN:  PT FREQUENCY: 1-2x/week  PT DURATION: 12 visits    PLANNED INTERVENTIONS: 97164- PT Re-evaluation, 97110-Therapeutic exercises, 97530- Therapeutic activity, 97112- Neuromuscular re-education, 97535- Self Care, 02859- Manual therapy, [phone]- Gait training, [phone]- Aquatic Therapy, [phone]- Electrical stimulation (unattended), [phone]- Traction (mechanical), [id number]- Ionotophoresis 4mg /ml Dexamethasone , 79439 (1-2 muscles), 20561 (3+ muscles)- Dry Needling, Patient/Family education, Balance training, Taping, Joint mobilization, Joint manipulation, Spinal manipulation, Spinal mobilization, Scar mobilization, Vestibular training, Cryotherapy, Moist heat, and Biofeedback  PLAN FOR NEXT SESSION: Review appropriate toilet mechanics;  teach self bowel mobilization techniques to improve motility; manual techniques to abdomen and low back/sacrum/coccyx; mobility/stretches; strengthening; pelvic floor muscle/external anal sphincter contraction training; pressure management  Darryle Navy, PT, DPT 12/08/251:15 PM  [facility] [address] [place], [place] [address] Phone # [phone] Fax [phone]

## 2024-03-22 ENCOUNTER — Ambulatory Visit: Admitting: Adult Health

## 2024-03-22 ENCOUNTER — Encounter: Payer: Self-pay | Admitting: Adult Health

## 2024-03-22 VITALS — BP 100/60 | HR 89 | Temp 98.7°F | Ht 69.0 in | Wt 323.0 lb

## 2024-03-22 DIAGNOSIS — F419 Anxiety disorder, unspecified: Secondary | ICD-10-CM

## 2024-03-22 NOTE — Progress Notes (Unsigned)
 Subjective:    Patient ID: Sydney Bradley, female    DOB: [birth year], 43 y.o.   MRN: [id number]  HPI  Discussed the use of AI scribe software for clinical note transcription with the patient, who gave verbal consent to proceed.  History of Present Illness   Sydney Bradley is a 43 year old female who presents for follow-up after a psychiatry visit regarding anxiety management.  She recently saw a psychiatrist who prescribed hydroxyzine  for anxiety, but she has not started it because she is worried about taking multiple medications and wants to discuss risks and benefits first. She is on FMLA leave that is ending soon and is seeking an extension because anxiety related to her workplace is severe. Thinking about returning to work triggers marked anxiety with subconscious compulsive skin picking that has caused bleeding. She is applying for other jobs. Her therapist advises against returning to her current workplace. She has a psychiatry follow-up on January 14th. She continues to describe her anxiety as severe and closely linked to her skin-picking behavior.   She was kept on Zoloft  50 mg daily and Wellbutrin  300 mg XR daily.       Review of Systems See HPI   Past Medical History:  Diagnosis Date   Allergy    Anxiety    Arthritis    L5 Back - no meds - otc prn   Asthma    Back pain    Constipation    Crohn's disease of ileum (HCC)    Fatty liver    GERD (gastroesophageal reflux disease)    High blood pressure    Hypertension    no meds x 3 yrs   IBS (irritable bowel syndrome)    Joint pain    Morbid obesity with BMI of 45.0-49.9, adult (HCC)    Recurrent upper respiratory infection (URI)    Sacral fracture (HCC)    Urticaria    Vitamin B 12 deficiency    Vitamin D  deficiency     Social History   Socioeconomic History   Marital status: Married    Spouse name: Not on file   Number of children: 1   Years of education: 18   Highest education level: Master's degree (e.g., MA, MS,  MEng, MEd, MSW, MBA)  Occupational History   Occupation: child psychotherapist    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[place], foster care   Married    One biologic son Judeth) born 2016 and a stepson   One caffeinated beverage daily   Social Drivers of Health   Tobacco Use: Low Risk (03/15/2024)   Patient History    Smoking Tobacco Use: Never    Smokeless Tobacco Use: Never    Passive Exposure: Past  Financial Resource Strain: Low Risk (10/07/2021)   Overall Financial Resource Strain (CARDIA)    Difficulty of Paying Living Expenses: Not very hard  Food Insecurity: No Food Insecurity (10/07/2021)   Hunger Vital Sign    Worried About Running Out  of Food in the Last Year: Never true    Ran Out of Food in the Last Year: Never true  Transportation Needs: No Transportation Needs (10/07/2021)   PRAPARE - Administrator, Civil Service (Medical): No    Lack of Transportation (Non-Medical): No  Physical Activity: Unknown (10/07/2021)   Exercise Vital Sign    Days of Exercise per Week: 0 days    Minutes of Exercise per Session: Not on file  Stress: Stress Concern Present (10/07/2021)   [employer] of Occupational Health - Occupational Stress Questionnaire    Feeling of Stress : To some extent  Social Connections: Moderately Integrated (10/07/2021)   Social Connection and Isolation Panel    Frequency of Communication with Friends and Family: More than three times a week    Frequency of Social Gatherings with Friends and Family: Once a week    Attends  Religious Services: More than 4 times per year    Active Member of [employer] or Organizations: No    Attends Banker Meetings: Not on file    Marital Status: Married  Intimate Partner Violence: Not At Risk (02/12/2023)   Received from [facility]   HITS    Over the last 12 months how often did your partner physically hurt you?: Never    Over the last 12 months how often did your partner insult you or talk down to you?: Never    Over the last 12 months how often did your partner threaten you with physical harm?: Never    Over the last 12 months how often did your partner scream or curse at you?: Never  Depression (PHQ2-9): High Risk (03/14/2024)   Depression (PHQ2-9)    PHQ-2 Score: 20  Alcohol Screen: Low Risk (10/07/2021)   Alcohol Screen    Last Alcohol Screening Score (AUDIT): 1  Housing: Low Risk (10/07/2021)   Housing    Last Housing Risk Score: 0  Utilities: Not on file  Health Literacy: Not on file    Past Surgical History:  Procedure Laterality Date   ADENOIDECTOMY     COLONOSCOPY     DILATION AND EVACUATION  02/25/2012   Procedure: DILATATION AND EVACUATION;  Surgeon: Rosaline DELENA Luna, MD;  Location: [facility];  Service: Gynecology;  Laterality: N/A;   DILATION AND EVACUATION N/A 03/01/2013   Procedure: DILATATION AND EVACUATION;  Surgeon: Oneil FORBES Piety, MD;  Location: [facility];  Service: Gynecology;  Laterality: N/A;   TONSILLECTOMY     TONSILLECTOMY AND ADENOIDECTOMY  2007    Family History  Problem Relation Age of Onset   Depression Mother    Obesity Mother    Colon cancer Father 74       mets to liver and bone died at 63   Anxiety disorder Father    Bipolar disorder Father    Drug abuse Father    Obesity Father    Angioedema Sister    COPD Maternal Grandmother    Dementia Maternal Grandmother    Alzheimer's disease Maternal Grandfather    Cancer Paternal Grandmother        blood cancer?   Stroke Paternal Grandmother    Eczema Son    Asthma Son     Allergic rhinitis Son    Stomach cancer Neg Hx    Esophageal cancer Neg Hx     Allergies[1]  Medications Ordered Prior to Encounter[2]  BP 100/60   Pulse 89   Temp 98.7 F (37.1 C) (Oral)  Ht 5' 9 (1.753 m)   Wt (!) 323 lb (146.5 kg)   LMP 02/24/2024 (Approximate)   SpO2 99%   BMI 47.70 kg/m       Objective:   Physical Exam Constitutional:      Appearance: Normal appearance. She is obese.  Skin:    General: Skin is warm and dry.  Neurological:     General: No focal deficit present.     Mental Status: She is alert and oriented to person, place, and time.  Psychiatric:        Mood and Affect: Mood normal.        Behavior: Behavior normal.        Thought Content: Thought content normal.        Judgment: Judgment normal.        Assessment & Plan:  Assessment and Plan    Anxiety disorder Anxiety remains not at complete remission.  Hydroxyzine  prescribed for acute episodes. Advised against returning to work due to stress exacerbating symptoms and I am happy to extend her FMLA paperwork.  - Start hydroxyzine  as needed      Darleene Shape, NP      [1]  Allergies Allergen Reactions   Ciprofloxacin Hives   Lamisil [Terbinafine] Hives   Latex Hives    Breathing problems   Other Anaphylaxis and Itching    Nuts Tree nuts   Peanut-Containing Drug Products Anaphylaxis    Allergy to all nuts    Shellfish Allergy Anaphylaxis   Ciprofibrate Hives   Penicillins Hives    Has patient had a PCN reaction causing immediate rash, facial/tongue/throat swelling, SOB or lightheadedness with hypotension: Yes Has patient had a PCN reaction causing severe rash involving mucus membranes or skin necrosis: No Has patient had a PCN reaction that required hospitalization No Has patient had a PCN reaction occurring within the last 10 years: No If all of the above answers are NO, then may proceed with Cephalosporin use.    Chocolate Itching   Wheat Itching  [2]  Current  Outpatient Medications on File Prior to Visit  Medication Sig Dispense Refill   albuterol  (PROVENTIL ) (2.5 MG/3ML) 0.083% nebulizer solution Take 3 mLs (2.5 mg total) by nebulization every 4 (four) hours as needed for wheezing or shortness of breath. 75 mL 0   albuterol  (VENTOLIN  HFA) 108 (90 Base) MCG/ACT inhaler Inhale 2 puffs into the lungs every 4 (four) hours as needed for wheezing or shortness of breath. 18 g 1   buPROPion  (WELLBUTRIN  XL) 300 MG 24 hr tablet TAKE ONE TABLET BY MOUTH ONCE A DAY 90 tablet 0   clotrimazole -betamethasone  (LOTRISONE ) cream Apply topically.     diphenhydrAMINE  (BENADRYL ) 25 MG tablet Take 25 mg by mouth as needed for allergies.      EPINEPHrine  0.3 mg/0.3 mL IJ SOAJ injection Inject 0.3 mg into the muscle as needed for anaphylaxis. 2 each 1   famotidine  (PEPCID ) 20 MG tablet Take 1 tablet (20 mg total) by mouth 2 (two) times daily. 60 tablet 5   fluticasone  (FLONASE ) 50 MCG/ACT nasal spray Place 2 sprays into both nostrils daily. 16 g 6   hydrOXYzine  (ATARAX ) 10 MG tablet Take 1 tablet (10 mg total) by mouth 3 (three) times daily as needed. 30 tablet 1   levocetirizine (XYZAL ) 5 MG tablet Take 1 tablet (5 mg total) by mouth in the morning and at bedtime. 120 tablet 5   metFORMIN  (GLUCOPHAGE -XR) 500 MG 24 hr tablet TAKE 2 TABLETS BY MOUTH  WITH DINNER 60 tablet 0   montelukast  (SINGULAIR ) 10 MG tablet Take 1 tablet (10 mg total) by mouth at bedtime. 30 tablet 5   Olopatadine  HCl (PATADAY ) 0.2 % SOLN 1 drop each eye daily as needed for itchy/watery eyes. 2.5 mL 3   omeprazole  (PRILOSEC) 40 MG capsule TAKE ONE CAPSULE BY MOUTH ONCE A DAY BEFORE BREAKFAST 90 capsule 3   sertraline  (ZOLOFT ) 50 MG tablet Take 1 tablet (50 mg total) by mouth daily. 30 tablet 3   topiramate  (TOPAMAX ) 100 MG tablet Take 1 tablet (100 mg total) by mouth daily before supper. 30 tablet 0   traZODone  (DESYREL ) 50 MG tablet TAKE ONE OR TWO TABLETS BY MOUTH AT BEDTIME AS NEEDED FOR SLEEP (Patient  taking differently: Take 50 mg by mouth as needed.) 60 tablet 6   Vitamin D , Ergocalciferol , (DRISDOL ) 1.25 MG (50000 UNIT) CAPS capsule Take 1 capsule (50,000 Units total) by mouth every 3 (three) days. 12 capsule 0   Current Facility-Administered Medications on File Prior to Visit  Medication Dose Route Frequency Provider Last Rate Last Admin   omalizumab  (XOLAIR ) prefilled syringe 300 mg  300 mg Subcutaneous Q28 days Jeneal Danita Macintosh, MD   300 mg at 02/14/23 1629

## 2024-03-26 ENCOUNTER — Ambulatory Visit: Admitting: Physical Therapy

## 2024-03-26 DIAGNOSIS — R293 Abnormal posture: Secondary | ICD-10-CM

## 2024-03-26 DIAGNOSIS — R279 Unspecified lack of coordination: Secondary | ICD-10-CM | POA: Diagnosis not present

## 2024-03-26 DIAGNOSIS — M6281 Muscle weakness (generalized): Secondary | ICD-10-CM

## 2024-03-26 NOTE — Therapy (Signed)
 OUTPATIENT PHYSICAL THERAPY FEMALE PELVIC TREATMENT   Patient Name: Sydney Bradley MRN: [id number] DOB:June 16, 1980, 43 y.o., female Today's Date: 03/26/2024  END OF SESSION:  PT End of Session - 03/26/24 1039     Visit Number 8    Date for Recertification  07/09/24    Authorization Type [employer]    PT Start Time 1037   arrival   PT Stop Time 1100    PT Time Calculation (min) 23 min    Activity Tolerance Patient tolerated treatment well    Behavior During Therapy WFL for tasks assessed/performed              Past Medical History:  Diagnosis Date   Allergy    Anxiety    Arthritis    L5 Back - no meds - otc prn   Asthma    Back pain    Constipation    Crohn's disease of ileum (HCC)    Fatty liver    GERD (gastroesophageal reflux disease)    High blood pressure    Hypertension    no meds x 3 yrs   IBS (irritable bowel syndrome)    Joint pain    Morbid obesity with BMI of 45.0-49.9, adult (HCC)    Recurrent upper respiratory infection (URI)    Sacral fracture (HCC)    Urticaria    Vitamin B 12 deficiency    Vitamin D  deficiency    Past Surgical History:  Procedure Laterality Date   ADENOIDECTOMY     COLONOSCOPY     DILATION AND EVACUATION  02/25/2012   Procedure: DILATATION AND EVACUATION;  Surgeon: Rosaline DELENA Luna, MD;  Location: [facility];  Service: Gynecology;  Laterality: N/A;   DILATION AND EVACUATION N/A 03/01/2013   Procedure: DILATATION AND EVACUATION;  Surgeon: Oneil FORBES Piety, MD;  Location: [facility];  Service: Gynecology;  Laterality: N/A;   TONSILLECTOMY     TONSILLECTOMY AND ADENOIDECTOMY  2007   Patient Active Problem List   Diagnosis Date Noted   Prediabetes 02/09/2023   Other fatigue 02/08/2023   SOB (shortness of breath) on exertion 02/08/2023   Vitamin D  deficiency 02/08/2023   Polyarthralgia 02/08/2023   Health care maintenance 02/08/2023   Elevated glucose 02/08/2023   Elevated blood pressure reading 02/08/2023   Class 2 severe  obesity due to excess calories with serious comorbidity in adult 02/08/2023   Plantar fasciitis 07/28/2018   Asthma 07/25/2018   BMI 45.0-49.9, adult (HCC) 12/13/2017   Long-term use of immunosuppressant medication - Cimzia  07/01/2017   Crohn's ileitis, with diarrhea (HCC) 12/10/2016   Gastroesophageal reflux disease 12/10/2016   Chronic hypertension complicating or reason for care during pregnancy    Pre-existing hypertension affecting pregnancy in second trimester    Other obesity 06/26/2013   Mild persistent asthma 11/14/2012    PCP: Merna Huxley, NP  REFERRING PROVIDER: Avram Lupita FORBES, MD   REFERRING DIAG: K59.00 (ICD-10-CM) - Constipation, unspecified constipation type  THERAPY DIAG:  Muscle weakness (generalized)  Abnormal posture  Unspecified lack of coordination  Rationale for Evaluation and Treatment: Rehabilitation  ONSET DATE: chronic - as long as she can remember   SUBJECTIVE:  SUBJECTIVE STATEMENT: Had popeyes and this has upset her stomach to having several type 7 sudden bowel movements with large amounts emptying and is feeling a little upset stomach now but ok.  No accidents, no urinary incontinence, and is now having regular bowel movements when not having a diet based flare.   PAIN: no pain 03/26/24  Are you having pain? Yes NPRS scale: 1/10 low back pain,  02/15/24: pain level 5/10 due to bad arthritis day Pain location: low back/abdominal pain  Pain type: pressure and cramping Pain description: intermittent   Aggravating factors: constipation Relieving factors: bowel movements   PRECAUTIONS: None  RED FLAGS: None   WEIGHT BEARING RESTRICTIONS: No  FALLS:  Has patient fallen in last 6 months? 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    ACTIVITY LEVEL : she wis working on walking and going to the gym  PLOF: Independent  PATIENT GOALS: improve bowel movements   PERTINENT HISTORY:  D&C x 2, Chron's, GERD, asthma, back pain, IBS, sacral fracture and L5 Sexual abuse: No  BOWEL MOVEMENT: Pain with bowel movement: Yes - sometimes  Type of bowel movement:Type ([place] Stool Scale) 4-7, Frequency 3x/week, Strain yes, and Splinting pushes on abdomen; not complete and can't get clean Fully empty rectum: No Leakage: Yes: cannot get clean after but does not leak at other times  Pads: Yes: panty liner Fiber supplement/laxative Yes - stool softener, miralax  URINATION: Pain with urination: No Fully empty bladder: No Stream: Strong Urgency: Yes  Frequency: 3x/day; occasionally 1xnight Fluid Intake: working on drinking more water - out on FMLA currently so getting more Leakage: none Pads: Yes: panty liner  INTERCOURSE:  Ability to have vaginal penetration Yes  Pain with intercourse: none DrynessNo Climax: harder to achieve  [place] Scale: 0/3 Lubricant: none   PREGNANCY: Vaginal deliveries 1 Tearing Yes:   Episiotomy No C-section deliveries 0 Currently pregnant No  PROLAPSE: None   OBJECTIVE:  Note: Objective measures were completed at Evaluation unless otherwise noted.  01/23/24 PATIENT SURVEYS:   PFIQ-7: 57 (bowel)  COGNITION: Overall cognitive status: Within functional limits for tasks assessed     SENSATION: Light touch: Appears intact   FUNCTIONAL TESTS:  Squat: use of hands on thighs for support Single leg stance:  Rt: pelvic drop  Lt: pelvic drop Curl-up test: abdominal distortion    GAIT: Assistive device utilized: None Comments: decreased trunk rotation and increased anterior pelvic tilt  POSTURE: rounded shoulders, forward head, increased lumbar lordosis, increased thoracic kyphosis, anterior pelvic tilt, and scoliosis with Rt thoracic curvature   LUMBARAROM/PROM:  A/PROM  A/PROM  Eval (% available)  Flexion 50 pain  Extension 25, pain  Right lateral flexion 25 pain  Left lateral flexion 25 pain  Right rotation 25 pain  Left rotation 25 paim   (Blank rows = not tested)  PALPATION:   General: tightness in lower thoracic and lumbar paraspinals, tenderness throughout low back, sacrum and coccyx  Pelvic Alignment: anterior pelvic tilt  Abdominal: tenderness throughout upper and lower, more notable lower abdominal tenderness and report of pressure; tightness throughout abdominals                 External Perineal Exam: WNL                             Internal Pelvic Floor: non-tender, anterior vaginal wall laxity palpable; external anal sphincter dyssynergia with paradoxical contraction and no appropriate increase  in intra-rectal pressure  Patient confirms identification and approves PT to assess internal pelvic floor and treatment Yes  PELVIC MMT:   MMT eval 02/15/24  Vaginal 3/5, 6 seconds, 7 repeat contractions   Internal Anal Sphincter 1/5 4/5  External Anal Sphincter 1/5 4/5  Puborectalis 1/5 4/5  Diastasis Recti 3 finger widths    (Blank rows = not tested)        TONE: WNL  PROLAPSE: Grade 2 anterior vaginal wall laxity. Grade 1 posterior vaginal wall laxity  TODAY'S TREATMENT:                                                                                                                              DATE:   03/26/24: Manual with pt in supine - abdominal massage for ILU and clockwise and counterclock wise   PATIENT EDUCATION:  Education details: [id number] Person educated: Patient Education method: Explanation, Demonstration, Tactile cues, Verbal cues, and Handouts Education comprehension: verbalized understanding  HOME EXERCISE PROGRAM: [id number]  ASSESSMENT:  CLINICAL IMPRESSION: Patient is a 43 y.o. female who was seen today for physical therapy treatment for chronic constipation, abdominal pain, low back pain, and urinary  urgency. Pt reports she overall is feeling much better. Does have setbacks with diet triggers. Pt limited today with time as she arrived late to appointment reporting she was having loose stool. She will continue to benefit from skilled PT intervention in order to improve constipation, decrease urinary urgency, decrease pain, address impairments, and improve quality of life.   OBJECTIVE IMPAIRMENTS: decreased activity tolerance, decreased coordination, decreased endurance, decreased mobility, decreased ROM, decreased strength, increased fascial restrictions, increased muscle spasms, impaired flexibility, impaired tone, improper body mechanics, postural dysfunction, and pain.   ACTIVITY LIMITATIONS: bending, standing, squatting, continence, and urinary urgency, constipation   PARTICIPATION LIMITATIONS: cleaning, laundry, driving, community activity, occupation, and exercise  PERSONAL FACTORS: 3+ comorbidities: medical history are also affecting patient's functional outcome.   REHAB POTENTIAL: Good  CLINICAL DECISION MAKING: Evolving/moderate complexity  EVALUATION COMPLEXITY: Moderate   GOALS: Goals reviewed with patient? Yes  SHORT TERM GOALS: Target date: 02/20/2024   Pt will be independent with HEP in order to improve activity tolerance.   Baseline: Goal status: MET 02/15/24  2.  Pt will increase external anal sphincter strength and coordination to 2/5 with appropriate contraction when she thinks she is squeezing in order to improve bowel evacuation and fecal smearing to help with personal hygiene.   Baseline: cannot empty and has smearing after bowel movements; 1/5 strength Goal status: Met 02/15/24  3.  Pt will generate appropriate increase in intra-rectal pressure with pushing during bowel movements in order to completely empty to improve personal hygiene  Baseline: no generation of intra-rectal pressure with pushing Goal status: Met 02/15/24  4.  Pt will increase all impaired  lumbar A/ROM by 25% without pain in order to improve length tension relationship in pelvic floor muscles, improving bowel movements.   Baseline: flexion  50% and all others reduced by 75% Goal status: MET 02/15/24  5.  Pt will be independent with use of squatty potty, relaxed toileting mechanics, and improved bowel movement techniques in order to increase ease of bowel movements and complete evacuation.   Baseline:  Goal status: MET 02/15/24  6.  Pt will be able to teach back and utilize urge suppression technique in order to help reduce number of trips to the bathroom.    Baseline:  Goal status: MET 02/15/24  LONG TERM GOALS: Target date: 07/09/2024  Pt will be independent with advanced HEP in order to improve activity tolerance.   Baseline:  Goal status:on going 03/19/24   2.  Pt will improve PFIQ-7 score to less than 30 in order to demonstrate improved quality of life. Baseline: 57 Goal status: on going 03/19/24   3.  Pt will have 5 or more bowel movements a week without straining and complete emptying in order to improve low back and abdominal pain.  Baseline: 7-10/10 pain; 3x/week Goal status: on going 03/19/24 - at least every other day if not daily now without pain now  4.  Patient will report 75% improvement in low back/abdominal pain in order to increase activity tolerance.   Baseline: 7-10/10 Goal status:MET 03/19/24   5.  Pt will increase external anal sphincter strength and coordination to 2/5 with appropriate contraction when she thinks she is squeezing in order to improve bowel evacuation and fecal smearing to help with personal hygiene.   Baseline: 1/5 Goal status: on going 03/19/24    PLAN:  PT FREQUENCY: 1-2x/week  PT DURATION: 12 visits    PLANNED INTERVENTIONS: 97164- PT Re-evaluation, 97110-Therapeutic exercises, 97530- Therapeutic activity, 97112- Neuromuscular re-education, 97535- Self Care, 02859- Manual therapy, [phone]- Gait training, [phone]- Aquatic  Therapy, [phone]- Electrical stimulation (unattended), [phone]- Traction (mechanical), [phone]- Ionotophoresis 4mg /ml Dexamethasone , 79439 (1-2 muscles), 20561 (3+ muscles)- Dry Needling, Patient/Family education, Balance training, Taping, Joint mobilization, Joint manipulation, Spinal manipulation, Spinal mobilization, Scar mobilization, Vestibular training, Cryotherapy, Moist heat, and Biofeedback  PLAN FOR NEXT SESSION: Review appropriate toilet mechanics;  teach self bowel mobilization techniques to improve motility; manual techniques to abdomen and low back/sacrum/coccyx; mobility/stretches; strengthening; pelvic floor muscle/external anal sphincter contraction training; pressure management  Darryle Navy, PT, DPT 12/15/202511:14 AM  [facility] [address], [address] [place], [place] [address] Phone # [phone] Fax [phone]

## 2024-03-30 ENCOUNTER — Encounter (INDEPENDENT_AMBULATORY_CARE_PROVIDER_SITE_OTHER): Payer: Self-pay | Admitting: Adult Health

## 2024-04-02 ENCOUNTER — Encounter: Admitting: Physical Therapy

## 2024-04-02 ENCOUNTER — Encounter (INDEPENDENT_AMBULATORY_CARE_PROVIDER_SITE_OTHER): Payer: Self-pay | Admitting: Adult Health

## 2024-04-09 ENCOUNTER — Ambulatory Visit: Admitting: Physical Therapy

## 2024-04-09 ENCOUNTER — Ambulatory Visit (INDEPENDENT_AMBULATORY_CARE_PROVIDER_SITE_OTHER): Admitting: Adult Health

## 2024-04-09 ENCOUNTER — Encounter: Payer: Self-pay | Admitting: Physical Therapy

## 2024-04-09 ENCOUNTER — Encounter (INDEPENDENT_AMBULATORY_CARE_PROVIDER_SITE_OTHER): Payer: Self-pay | Admitting: Adult Health

## 2024-04-09 VITALS — BP 118/85 | HR 79 | Temp 98.5°F | Ht 69.0 in | Wt 315.0 lb

## 2024-04-09 DIAGNOSIS — R293 Abnormal posture: Secondary | ICD-10-CM

## 2024-04-09 DIAGNOSIS — M5459 Other low back pain: Secondary | ICD-10-CM

## 2024-04-09 DIAGNOSIS — Z6841 Body Mass Index (BMI) 40.0 and over, adult: Secondary | ICD-10-CM | POA: Diagnosis not present

## 2024-04-09 DIAGNOSIS — R632 Polyphagia: Secondary | ICD-10-CM | POA: Diagnosis not present

## 2024-04-09 DIAGNOSIS — R279 Unspecified lack of coordination: Secondary | ICD-10-CM

## 2024-04-09 DIAGNOSIS — F5089 Other specified eating disorder: Secondary | ICD-10-CM | POA: Diagnosis not present

## 2024-04-09 DIAGNOSIS — E669 Obesity, unspecified: Secondary | ICD-10-CM

## 2024-04-09 DIAGNOSIS — R7303 Prediabetes: Secondary | ICD-10-CM | POA: Diagnosis not present

## 2024-04-09 DIAGNOSIS — E559 Vitamin D deficiency, unspecified: Secondary | ICD-10-CM | POA: Diagnosis not present

## 2024-04-09 DIAGNOSIS — M6281 Muscle weakness (generalized): Secondary | ICD-10-CM

## 2024-04-09 DIAGNOSIS — R1030 Lower abdominal pain, unspecified: Secondary | ICD-10-CM

## 2024-04-09 MED ORDER — TOPIRAMATE 50 MG PO TABS: ORAL_TABLET | ORAL | 0 refills | Status: DC

## 2024-04-09 MED ORDER — TOPIRAMATE 100 MG PO TABS: 100.0000 mg | ORAL_TABLET | Freq: Every day | ORAL | 0 refills | Status: DC

## 2024-04-09 MED ORDER — METFORMIN HCL ER 500 MG PO TB24: ORAL_TABLET | ORAL | 0 refills | Status: DC

## 2024-04-09 MED ORDER — VITAMIN D (ERGOCALCIFEROL) 1.25 MG (50000 UNIT) PO CAPS: 50000.0000 [IU] | ORAL_CAPSULE | ORAL | 0 refills | Status: DC

## 2024-04-09 NOTE — Therapy (Signed)
 " OUTPATIENT PHYSICAL THERAPY FEMALE PELVIC TREATMENT   Patient Name: Sydney Bradley MRN: [id number] DOB:11/29/1980, 43 y.o., female Today's Date: 04/09/2024  END OF SESSION:  PT End of Session - 04/09/24 1033     Visit Number 9    Date for Recertification  07/09/24    Authorization Type [employer]    PT Start Time 1031   came late   PT Stop Time 1055    PT Time Calculation (min) 24 min    Activity Tolerance Patient tolerated treatment well    Behavior During Therapy WFL for tasks assessed/performed              Past Medical History:  Diagnosis Date   Allergy    Anxiety    Arthritis    L5 Back - no meds - otc prn   Asthma    Back pain    Constipation    Crohn's disease of ileum (HCC)    Fatty liver    GERD (gastroesophageal reflux disease)    High blood pressure    Hypertension    no meds x 3 yrs   IBS (irritable bowel syndrome)    Joint pain    Morbid obesity with BMI of 45.0-49.9, adult (HCC)    Recurrent upper respiratory infection (URI)    Sacral fracture (HCC)    Urticaria    Vitamin B 12 deficiency    Vitamin D  deficiency    Past Surgical History:  Procedure Laterality Date   ADENOIDECTOMY     COLONOSCOPY     DILATION AND EVACUATION  02/25/2012   Procedure: DILATATION AND EVACUATION;  Surgeon: Rosaline DELENA Luna, MD;  Location: [facility];  Service: Gynecology;  Laterality: N/A;   DILATION AND EVACUATION N/A 03/01/2013   Procedure: DILATATION AND EVACUATION;  Surgeon: Oneil FORBES Piety, MD;  Location: [facility];  Service: Gynecology;  Laterality: N/A;   TONSILLECTOMY     TONSILLECTOMY AND ADENOIDECTOMY  2007   Patient Active Problem List   Diagnosis Date Noted   Prediabetes 02/09/2023   Other fatigue 02/08/2023   SOB (shortness of breath) on exertion 02/08/2023   Vitamin D  deficiency 02/08/2023   Polyarthralgia 02/08/2023   Health care maintenance 02/08/2023   Elevated glucose 02/08/2023   Elevated blood pressure reading 02/08/2023   Class 2 severe  obesity due to excess calories with serious comorbidity in adult 02/08/2023   Plantar fasciitis 07/28/2018   Asthma 07/25/2018   BMI 45.0-49.9, adult (HCC) 12/13/2017   Long-term use of immunosuppressant medication - Cimzia  07/01/2017   Crohn's ileitis, with diarrhea (HCC) 12/10/2016   Gastroesophageal reflux disease 12/10/2016   Chronic hypertension complicating or reason for care during pregnancy    Pre-existing hypertension affecting pregnancy in second trimester    Other obesity 06/26/2013   Mild persistent asthma 11/14/2012    PCP: Merna Huxley, NP  REFERRING PROVIDER: Avram Lupita FORBES, MD   REFERRING DIAG: K59.00 (ICD-10-CM) - Constipation, unspecified constipation type  THERAPY DIAG:  Muscle weakness (generalized)  Abnormal posture  Unspecified lack of coordination  Lower abdominal pain  Other low back pain  Rationale for Evaluation and Treatment: Rehabilitation  ONSET DATE: chronic - as long as she can remember   SUBJECTIVE:  SUBJECTIVE STATEMENT: I have had an upset stomach and has been stress eating. I am having explosive diarrhea around 3-4 in the morning. I get a sudden urge to have a bowel movement when going out. Patient sometimes has difficulty with getting to therapy due to being on the commode. Water intake is better.    PAIN: no pain 03/26/24  Are you having pain? Yes NPRS scale: 1/10 low back pain,  02/15/24: pain level 5/10 due to bad arthritis day 04/09/24: pain level 7/10 due to weather Pain location: low back/abdominal pain  Pain type: pressure and cramping Pain description: intermittent   Aggravating factors: constipation Relieving factors: bowel movements   PRECAUTIONS: None  RED FLAGS: None   WEIGHT BEARING RESTRICTIONS: No  FALLS:  Has patient  fallen in last 6 months? 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   ACTIVITY LEVEL : she wis working on walking and going to the gym  PLOF: Independent  PATIENT GOALS: improve bowel movements   PERTINENT HISTORY:  D&C x 2, Chron's, GERD, asthma, back pain, IBS, sacral fracture and L5 Sexual abuse: No  BOWEL MOVEMENT: Pain with bowel movement: Yes - sometimes  Type of bowel movement:Type (B[place] Stool Scale) 4-7, Frequency 3x/week, Strain yes, and Splinting pushes on abdomen; not complete and can't get clean Fully empty rectum: No Leakage: Yes: cannot get clean after but does not leak at other times  Pads: Yes: panty liner Fiber supplement/laxative Yes - stool softener, miralax  URINATION: Pain with urination: No Fully empty bladder: No Stream: Strong Urgency: Yes  Frequency: 3x/day; occasionally 1xnight Fluid Intake: working on drinking more water - out on FMLA currently so getting more Leakage: none Pads: Yes: panty liner  INTERCOURSE:  Ability to have vaginal penetration Yes  Pain with intercourse: none DrynessNo Climax: harder to achieve  [place] Scale: 0/3 Lubricant: none   PREGNANCY: Vaginal deliveries 1 Tearing Yes:   Episiotomy No C-section deliveries 0 Currently pregnant No  PROLAPSE: None   OBJECTIVE:  Note: Objective measures were completed at Evaluation unless otherwise noted.  01/23/24 PATIENT SURVEYS:   PFIQ-7: 30 (bowel)  COGNITION: Overall cognitive status: Within functional limits for tasks assessed     SENSATION: Light touch: Appears intact   FUNCTIONAL TESTS:  Squat: use of hands on thighs for support Single leg stance:  Rt: pelvic drop  Lt: pelvic drop Curl-up test: abdominal distortion    GAIT: Assistive device utilized: None Comments: decreased trunk rotation and increased anterior pelvic tilt  POSTURE: rounded shoulders, forward head, increased lumbar lordosis, increased thoracic kyphosis, anterior  pelvic tilt, and scoliosis with Rt thoracic curvature   LUMBARAROM/PROM:  A/PROM A/PROM  Eval (% available) 04/09/24  Flexion 50 pain 50  Extension 25, pain 100  Right lateral flexion 25 pain 100  Left lateral flexion 25 pain 100  Right rotation 25 pain 100  Left rotation 25 paim 100   (Blank rows = not tested)  PALPATION:   General: tightness in lower thoracic and lumbar paraspinals, tenderness throughout low back, sacrum and coccyx  Pelvic Alignment: anterior pelvic tilt  Abdominal: tenderness throughout upper and lower, more notable lower abdominal tenderness and report of pressure; tightness throughout abdominals                 External Perineal Exam: WNL                             Internal Pelvic Floor:  non-tender, anterior vaginal wall laxity palpable; external anal sphincter dyssynergia with paradoxical contraction and no appropriate increase in intra-rectal pressure  Patient confirms identification and approves PT to assess internal pelvic floor and treatment Yes  PELVIC MMT:   MMT eval 02/15/24  Vaginal 3/5, 6 seconds, 7 repeat contractions   Internal Anal Sphincter 1/5 4/5  External Anal Sphincter 1/5 4/5  Puborectalis 1/5 4/5  Diastasis Recti 3 finger widths    (Blank rows = not tested)        TONE: WNL  PROLAPSE: Grade 2 anterior vaginal wall laxity. Grade 1 posterior vaginal wall laxity  TODAY'S TREATMENT:                                                                                                                              DATE:  04/09/24 Manual: Soft tissue mobilization: Manual work to the lower abdomen.  Exercises: Stretches/mobility: Diaphragmatic breathing 10 x Supine trunk rotation 6 x Butterfly stretch in supine holding 30 sec Strengthening: Sitting pelvic floor contraction x 10 then 10 quick flicks     03/26/24: Manual with pt in supine - abdominal massage for ILU and clockwise and counterclock wise   PATIENT EDUCATION:   04/09/24 Education details: [id number] Person educated: Patient Education method: Explanation, Demonstration, Actor cues, Verbal cues, and Handouts Education comprehension: verbalized understanding  HOME EXERCISE PROGRAM: 04/09/24 Access Code: [email or web address] URL: [email or web address] Date: 04/09/2024 Prepared by: Channing Pereyra  Exercises - Supine Diaphragmatic Breathing  - 1 x daily - 7 x weekly - 1 sets - 10 reps - Supine Lower Trunk Rotation  - 1 x daily - 7 x weekly - 1 sets - 10 reps - Supine Butterfly Groin Stretch  - 1 x daily - 7 x weekly - 3 sets - 30s hold - Child's Pose Stretch  - 1 x daily - 7 x weekly - 1 sets - 3 reps - 30s holds - Cat Cow  - 1 x daily - 7 x weekly - 1 sets - 10 reps - 10 second kegel holds on towel  - 2 x daily - 7 x weekly - 1 sets - 10 reps - 10s holds - fast kegels   - 2 x daily - 7 x weekly - 1 sets - 10 reps   ASSESSMENT:  CLINICAL IMPRESSION: Patient is a 43 y.o. female who was seen today for physical therapy treatment for chronic constipation, abdominal pain, low back pain, and urinary urgency. Patient is able to fully empty her rectum. She does not have to wipe multiple times due to stool. She is able to have a complete bowel movements and perform quickly. Patient is having type 4 stool. She has full lumbar ROM. Abdomen is softer. She has improved pelvic floor strength. She is able to have an orgasm better due to improve pelvic floor mobility. She will have to go to the bathroom during the night and morning but this is due to her eating  habits at night and is working with her doctor with this.   OBJECTIVE IMPAIRMENTS: decreased activity tolerance, decreased coordination, decreased endurance, decreased mobility, decreased ROM, decreased strength, increased fascial restrictions, increased muscle spasms, impaired flexibility, impaired tone, improper body mechanics, postural dysfunction, and pain.   ACTIVITY LIMITATIONS: bending, standing,  squatting, continence, and urinary urgency, constipation   PARTICIPATION LIMITATIONS: cleaning, laundry, driving, community activity, occupation, and exercise  PERSONAL FACTORS: 3+ comorbidities: medical history are also affecting patient's functional outcome.   REHAB POTENTIAL: Good  CLINICAL DECISION MAKING: Evolving/moderate complexity  EVALUATION COMPLEXITY: Moderate   GOALS: Goals reviewed with patient? Yes  SHORT TERM GOALS: Target date: 02/20/2024   Pt will be independent with HEP in order to improve activity tolerance.   Baseline: Goal status: MET 02/15/24  2.  Pt will increase external anal sphincter strength and coordination to 2/5 with appropriate contraction when she thinks she is squeezing in order to improve bowel evacuation and fecal smearing to help with personal hygiene.   Baseline: cannot empty and has smearing after bowel movements; 1/5 strength Goal status: Met 02/15/24  3.  Pt will generate appropriate increase in intra-rectal pressure with pushing during bowel movements in order to completely empty to improve personal hygiene  Baseline: no generation of intra-rectal pressure with pushing Goal status: Met 02/15/24  4.  Pt will increase all impaired lumbar A/ROM by 25% without pain in order to improve length tension relationship in pelvic floor muscles, improving bowel movements.   Baseline: flexion 50% and all others reduced by 75% Goal status: MET 02/15/24  5.  Pt will be independent with use of squatty potty, relaxed toileting mechanics, and improved bowel movement techniques in order to increase ease of bowel movements and complete evacuation.   Baseline:  Goal status: MET 02/15/24  6.  Pt will be able to teach back and utilize urge suppression technique in order to help reduce number of trips to the bathroom.    Baseline:  Goal status: MET 02/15/24  LONG TERM GOALS: Target date: 07/09/2024  Pt will be independent with advanced HEP in order to  improve activity tolerance.  Baseline:  Goal status: Met 04/09/24  2.  Pt will improve PFIQ-7 score to less than 30 in order to demonstrate improved quality of life. Baseline: 57 Goal status: Met 04/09/24  3.  Pt will have 5 or more bowel movements a week without straining and complete emptying in order to improve low back and abdominal pain.  Baseline: 7-10/10 pain; 3x/week Goal status: Met 04/09/24  4.  Patient will report 75% improvement in low back/abdominal pain in order to increase activity tolerance.  Baseline: 7-10/10 Goal status:MET 03/19/24   5.  Pt will increase external anal sphincter strength and coordination to 2/5 with appropriate contraction when she thinks she is squeezing in order to improve bowel evacuation and fecal smearing to help with personal hygiene.  Baseline: 1/5 Goal status: Met 04/09/24    PLAN:Discharge to HEP this vist   Channing Pereyra, PT 04/09/2024 11:03 AM  [facility] [address] [place], [place] [address] Phone # [phone] Fax [phone]   PHYSICAL THERAPY DISCHARGE SUMMARY  Visits from Start of Care: 9  Current functional level related to goals / functional outcomes: See above.    Remaining deficits: See above.    Education / Equipment: HEP   Patient agrees to discharge. Patient goals were met. Patient is being discharged due to meeting the stated rehab goals. Thank you for  the referral.   Channing Pereyra, PT 04/09/2024 11:03 AM   "

## 2024-04-09 NOTE — Progress Notes (Signed)
 "    WEIGHT SUMMARY AND BIOMETRICS  Vitals Temp: 98.5 F (36.9 C) BP: 118/85 Pulse Rate: 79 SpO2: 99 %   Anthropometric Measurements Height: 5' 9 (1.753 m) Weight: (!) 315 lb (142.9 kg) BMI (Calculated): 46.5 Weight at Last Visit: 319lb Weight Lost Since Last Visit: 4lb Weight Gained Since Last Visit: 0lb Starting Weight: 343lb Total Weight Loss (lbs): 28 lb (12.7 kg)   Body Composition  Body Fat %: 52.6 % Fat Mass (lbs): 166.2 lbs Muscle Mass (lbs): 142 lbs Total Body Water (lbs): 96.6 lbs Visceral Fat Rating : 17   Other Clinical Data Fasting: No Labs: No Today's Visit #: 15 Starting Date: 02/08/23    Chief Complaint:   OBESITY Sydney Bradley is here to discuss her progress with her obesity treatment plan.  She is on the the Category 3 Plan and states she is following her eating plan approximately 10 % of the time.  She states she is exercising Walking 10-15 minutes 1-2 times per week.  Interim History:  She remains on FMLA  Her recent STD and Worker's Compensation claims were both denied. She is meeting with her PCP this week to obtain amplifying information to submit Sydney Bradley appeal.  She endorses nighttime snacking on sugary foods, ie: swiss rolls, cheese grits with sugar, oatmeal with honey  She is currently on Metformin  XR 500mg  2 tabs at dinner, nightly Topamax  100mg  Reviewed healthier snacks with higher protein content  Subjective:   1. Prediabetes Lab Results  Component Value Date   HGBA1C 5.5 01/16/2024   HGBA1C 5.4 11/09/2023   HGBA1C 5.7 (H) 02/08/2023     Latest Reference Range & Units 01/16/24 11:08  eGFR >59 mL/min/1.73 71   She is also on Metformin  XR 500mg  2 tabs at dinner She denies GI upset with use She has been snacking more in evenings that last several weeks Reviewed healthier snacks with higher protein content  2. Polyphagia She is on nightly Topamax  100mg  She denies parethesias or word finding problems. She endorses nighttime  snacking on sugary foods, ie: swiss rolls, cheese grits with sugar, oatmeal with honey She is agreeable to Topamax  increased to 150mg  nightly She is also on Metformin  XR 500mg  2 tabs at dinner  3. Vitamin D  deficiency  Latest Reference Range & Units 01/16/24 11:08  Vitamin D , 25-Hydroxy 30.0 - 100.0 ng/mL 24.0 (L)  (L): Data is abnormally low  Vit D Level well below goal of 50-70  4. Other disorder of eating She is on nightly Topamax  100mg  She denies parethesias or word finding problems. She endorses nighttime snacking on sugary foods, ie: swiss rolls, cheese grits with sugar, oatmeal with honey She is agreeable to Topamax  increased to 150mg  nightly  Assessment/Plan:   1. Prediabetes Refill - metFORMIN  (GLUCOPHAGE -XR) 500 MG 24 hr tablet; TAKE 2 TABLETS BY MOUTH WITH DINNER  Dispense: 60 tablet; Refill: 0  2. Polyphagia Refill - metFORMIN  (GLUCOPHAGE -XR) 500 MG 24 hr tablet; TAKE 2 TABLETS BY MOUTH WITH DINNER  Dispense: 60 tablet; Refill: 0  3. Vitamin D  deficiency Refill - Vitamin D , Ergocalciferol , (DRISDOL ) 1.25 MG (50000 UNIT) CAPS capsule; Take 1 capsule (50,000 Units total) by mouth every 3 (three) days.  Dispense: 12 capsule; Refill: 0  4. Other disorder of eating Refill and INCREASE - topiramate  (TOPAMAX ) 100 MG tablet; Take 1 tablet (100 mg total) by mouth daily before supper.  Dispense: 30 tablet; Refill: 0 - topiramate  (TOPAMAX ) 50 MG tablet; Take one tab nightly with the 100mg  to equal  150mg  nightly  Dispense: 30 tablet; Refill: 0  5. BMI 45.0-49.9, adult (HCC), CURRENT BMI 46.6  Sydney Bradley is currently in the action stage of change. As such, her goal is to continue with weight loss efforts. She has agreed to the Category 3 Plan.   Exercise goals: For substantial health benefits, adults should do at least 150 minutes (2 hours and 30 minutes) a week of moderate-intensity, or 75 minutes (1 hour and 15 minutes) a week of vigorous-intensity aerobic physical activity, or Sydney Bradley  equivalent combination of moderate- and vigorous-intensity aerobic activity. Aerobic activity should be performed in episodes of at least 10 minutes, and preferably, it should be spread throughout the week.  Behavioral modification strategies: increasing lean protein intake, decreasing simple carbohydrates, increasing vegetables, increasing water intake, no skipping meals, meal planning and cooking strategies, keeping healthy foods in the home, ways to avoid boredom eating, better snacking choices, emotional eating strategies, celebration eating strategies, planning for success, and decreasing junk food.  Sydney Bradley has agreed to follow-up with our clinic in 4 weeks. She was informed of the importance of frequent follow-up visits to maximize her success with intensive lifestyle modifications for her multiple health conditions.   Objective:   Blood pressure 118/85, pulse 79, temperature 98.5 F (36.9 C), height 5' 9 (1.753 m), weight (!) 315 lb (142.9 kg), last menstrual period 03/22/2024, SpO2 99%. Body mass index is 46.52 kg/m.  General: Cooperative, alert, well developed, in no acute distress. HEENT: Conjunctivae and lids unremarkable. Cardiovascular: Regular rhythm.  Lungs: Normal work of breathing. Neurologic: No focal deficits.   Lab Results  Component Value Date   CREATININE 1.00 01/16/2024   BUN 8 01/16/2024   NA 138 01/16/2024   K 4.1 01/16/2024   CL 107 (H) 01/16/2024   CO2 18 (L) 01/16/2024   Lab Results  Component Value Date   ALT 12 01/16/2024   AST 14 01/16/2024   ALKPHOS 75 01/16/2024   BILITOT 0.4 01/16/2024   Lab Results  Component Value Date   HGBA1C 5.5 01/16/2024   HGBA1C 5.4 11/09/2023   HGBA1C 5.7 (H) 02/08/2023   HGBA1C 5.6 08/14/2019   HGBA1C 5.8 11/11/2017   Lab Results  Component Value Date   INSULIN  20.1 01/16/2024   INSULIN  20.0 11/09/2023   INSULIN  12.3 02/08/2023   Lab Results  Component Value Date   TSH 2.980 01/16/2024   Lab Results   Component Value Date   CHOL 151 02/08/2023   HDL 44 02/08/2023   LDLCALC 90 02/08/2023   TRIG 89 02/08/2023   CHOLHDL 3 08/14/2019   Lab Results  Component Value Date   VD25OH 24.0 (L) 01/16/2024   VD25OH 19.9 (L) 11/09/2023   VD25OH 21.1 (L) 02/08/2023   Lab Results  Component Value Date   WBC 5.6 09/26/2023   HGB 12.8 09/26/2023   HCT 39.5 09/26/2023   MCV 82.6 09/26/2023   PLT 249.0 09/26/2023   Lab Results  Component Value Date   IRON 74 12/18/2021   TIBC 289.8 12/18/2021   FERRITIN 77.6 12/18/2021   Attestation Statements:   Reviewed by clinician on day of visit: allergies, medications, problem list, medical history, surgical history, family history, social history, and previous encounter notes.  I have reviewed the above documentation for accuracy and completeness, and I agree with the above. -  Bernisha Verma d. Allysson Rinehimer, NP-C "

## 2024-04-11 ENCOUNTER — Ambulatory Visit: Admitting: Adult Health

## 2024-04-16 NOTE — Progress Notes (Signed)
 " Psychiatric Follow Up  Patient Identification: Sydney Bradley MRN:  [id number] Date of Evaluation:  04/25/2024 Referral Source: Primary care provider Chief Complaint:   No chief complaint on file.  Visit Diagnosis:    ICD-10-CM   1. GAD (generalized anxiety disorder)  F41.1 hydrOXYzine  (ATARAX ) 10 MG tablet        Assessment:  Sydney Bradley is a 44 y.o. female with a history of GAD MDD who presents in person to [facility] at [facility] for initial evaluation on 04/25/2024.    Today, patient has had an improvement in her symptoms of anxiety and depression however they still prevail due to the ongoing psychosocial stressors around her work and the situation at work.  She is stressed about getting accommodations situated at her workplace or to move out of the [facility] office.  She does have baseline anxiety but the intensity of panic attacks have reduced after starting hydroxyzine .  Symptoms of depression also are situated around the same psychosocial stressors.  She is also having anxiety around the financial constraints.  Her sleep has been fragmented as she has been having frequent urination due to the reported flareup of chron's disease and that has also been affecting her appetite.  She has continued seeking therapy with good therapeutic relationship.  No other new health concerns or side effects of the medications.  Stress continue to play a major role in affecting her mental health though she does have a strong support system at home including her pet and husband.  There are no safety concerns and she is not actively passively homicidal or suicidal.  She is off work until the end of January and she is working on going back to work after getting her accommodations approved.  She is not using any substances including alcohol or cigarettes.  Due to significant benefit, we will continue her on hydroxyzine  as needed for intense anxiety and panic attacks.  Her PCP is managing  other medications for her depression and sleep.  Will see her back in the clinic in 8 weeks.  Risk Assessment: A suicide and violence risk assessment was performed as part of this evaluation. There patient is deemed to be at chronic elevated risk for self-harm/suicide given the following factors: N/A. These risk factors are mitigated by the following factors: lack of active SI/HI, no known access to weapons or firearms, no history of previous suicide attempts, no history of violence, and motivation for treatment. The patient is deemed to be at chronic elevated risk for violence given the following factors: N/A. These risk factors are mitigated by the following factors: no known history of violence towards others, no known violence towards others in the last 6 months, no known history of threats of harm towards others, no known homicidal ideation in the last 6 months, no command hallucinations to harm others in the last 6 months, and no active symptoms of psychosis. There is no acute risk for suicide or violence at this time. The patient was educated about relevant modifiable risk factors including following recommendations for treatment of psychiatric illness and abstaining from substance abuse.  While future psychiatric events cannot be accurately predicted, the patient does not currently require  acute inpatient psychiatric care and does not currently meet [place]  involuntary commitment criteria.  Patient was given contact information for crisis resources, behavioral health clinic and was instructed to call 911 for emergencies.    Plan: # MDD without psychotic features, moderate Past medication trials:  Status of  problem: Current Interventions: -- Continue Wellbutrin  XL 300 mg daily -- Continue Zoloft  50 mg daily -- Continue trazodone  25 to 50 mg nightly as needed for sleep  # GAD/panic attacks Past medication trials:  Status of problem: Current Interventions: -- Continue hydroxyzine  10  mg 3 times daily as needed -- Continue SSRI as above -- Continue therapy as outpatient   History of Present Illness:   Ms. Totman is a 44 year old female with a history of GAD, MDD presented to the clinic today for follow-up.  She reported her mood as I am better today .  Reported that she had a rough morning and took the hydroxyzine  for anxiety.  She denied any active or passive SI/HI/AVH.  Reported that her anxiety has been same .  When asked about the reason she stated situations have not changed, I am holding it together .  Reported that she is still having anxiety around her work and stressors related to the individual that was making threats to her family.  Stated that he is currently in jail.  Also reported stress around getting her accommodations with work situated my PCP has been working with that for reasonable accommodations . She reported her appetite as not great , stated that with stress her symptoms of Crohn's have exaggerated causing her to have frequent diarrheal episodes and frequent urination that has been affecting her sleep.  Stated that she has been taking trazodone  it helps me go to bed .  Endorsed symptoms of depression due to the above-mentioned psychosocial stressors.  She denied using any substances including alcohol or cigarettes. Reported that she is taking all her medications as prescribed, denied any side effects.  Stated that her PCP is managing her Wellbutrin  and Zoloft  and trazodone .  She has continued therapy with Levon Mau, has good therapeutic relationship, encouraged to keep up with therapy appointments.  She is looking to get her job situated out of the [employer].  Discussed about sending refills of hydroxyzine  for anxiety and panic attacks  I think I am on the right regimen, will follow-up with her in 8 weeks.  Associated Signs/Symptoms: Depression Symptoms:  depressed mood, insomnia, fatigue, feelings of worthlessness/guilt, difficulty  concentrating, hopelessness, anxiety, loss of energy/fatigue, disturbed sleep, increased appetite, (Hypo) Manic Symptoms:  None Anxiety Symptoms:  Excessive Worry, Psychotic Symptoms:  None PTSD Symptoms: Negative  Past Psychiatric History:  Past psychiatric diagnoses: MDD, GAD Psychiatric hospitalizations: None Past suicide attempts: Denies Hx of self harm: Denies Hx of violence towards others: Denies Prior psychiatric providers: None Prior therapy: None Access to firearms: Denies  Prior medication trials: Zoloft , Wellbutrin  300 mg XL, Xanax , Cymbalta , Trazodone   Substance use: Denies  Past Medical History:  Past Medical History:  Diagnosis Date   Allergy    Anxiety    Arthritis    L5 Back - no meds - otc prn   Asthma    Back pain    Constipation    Crohn's disease of ileum (HCC)    Fatty liver    GERD (gastroesophageal reflux disease)    High blood pressure    Hypertension    no meds x 3 yrs   IBS (irritable bowel syndrome)    Joint pain    Morbid obesity with BMI of 45.0-49.9, adult (HCC)    Recurrent upper respiratory infection (URI)    Sacral fracture (HCC)    Urticaria    Vitamin B 12 deficiency    Vitamin D  deficiency     Past Surgical  History:  Procedure Laterality Date   ADENOIDECTOMY     COLONOSCOPY     DILATION AND EVACUATION  02/25/2012   Procedure: DILATATION AND EVACUATION;  Surgeon: Rosaline DELENA Luna, MD;  Location: [facility];  Service: Gynecology;  Laterality: N/A;   DILATION AND EVACUATION N/A 03/01/2013   Procedure: DILATATION AND EVACUATION;  Surgeon: Oneil FORBES Piety, MD;  Location: [facility];  Service: Gynecology;  Laterality: N/A;   TONSILLECTOMY     TONSILLECTOMY AND ADENOIDECTOMY  2007    Family Psychiatric History: Her grandmother and mother had depression and anxiety, her dad history of bipolar disorder.  Her sister has anxiety as well. Family History:  Family History  Problem Relation Age of Onset   Depression Mother    Obesity  Mother    Colon cancer Father 66       mets to liver and bone died at 3   Anxiety disorder Father    Bipolar disorder Father    Drug abuse Father    Obesity Father    Angioedema Sister    COPD Maternal Grandmother    Dementia Maternal Grandmother    Alzheimer's disease Maternal Grandfather    Cancer Paternal Grandmother        blood cancer?   Stroke Paternal Grandmother    Eczema Son    Asthma Son    Allergic rhinitis Son    Stomach cancer Neg Hx    Esophageal cancer Neg Hx     Social History:   Social History   Socioeconomic History   Marital status: Married    Spouse name: Not on file   Number of children: 1   Years of education: 18   Highest education level: Master's degree (e.g., MA, MS, MEng, MEd, MSW, MBA)  Occupational History   Occupation: child psychotherapist    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[place], foster care   Married    One biologic son Judeth) born 2016 and a stepson   One caffeinated beverage daily   Social Drivers of Health   Tobacco Use: Low Risk (04/25/2024)   Patient History    Smoking Tobacco Use: Never    Smokeless Tobacco Use: Never    Passive Exposure: Past  Financial Resource Strain: Low Risk (04/04/2024)   Overall Financial Resource Strain (CARDIA)    Difficulty of Paying Living Expenses: Not very hard  Food Insecurity: Food Insecurity Present (04/04/2024)   Epic    Worried About Programme Researcher, Broadcasting/film/video in the Last Year: Sometimes true    Ran Out of Food in the Last Year: Never true  Transportation Needs: No  Transportation Needs (04/04/2024)   Epic    Lack of Transportation (Medical): No    Lack of Transportation (Non-Medical): No  Physical Activity: Insufficiently Active (04/04/2024)   Exercise Vital Sign    Days of Exercise per Week: 1 day    Minutes of Exercise per Session: 10 min  Stress: Stress Concern Present (04/04/2024)   [employer]  of Occupational Health - Occupational Stress Questionnaire    Feeling of Stress: Very much  Social Connections: Unknown (04/04/2024)   Social Connection and Isolation Panel    Frequency of Communication with Friends and Family: Twice a week    Frequency of Social Gatherings with Friends and Family: Patient declined    Attends Religious Services: More than 4 times per year    Active Member of Clubs or Organizations: Yes    Attends Banker Meetings: More than 4 times per year    Marital Status: Married  Depression (PHQ2-9): High Risk (03/14/2024)   Depression (PHQ2-9)    PHQ-2 Score: 20  Alcohol Screen: Low Risk (04/04/2024)   Alcohol Screen    Last Alcohol Screening Score (AUDIT): 1  Housing: High Risk (04/04/2024)   Epic    Unable to Pay for Housing in the Last Year: Yes    Number of Times Moved in the Last Year: 0    Homeless in the Last Year: No  Utilities: Not on file  Health Literacy: Not on file    Additional Social History: 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  She is currently off work on [employer] until February.  Allergies:   Allergies  Allergen Reactions   Ciprofloxacin Hives   Lamisil [Terbinafine] Hives   Latex Hives    Breathing problems   Other Anaphylaxis and Itching    Nuts Tree nuts   Peanut-Containing Drug Products Anaphylaxis    Allergy to all nuts    Shellfish Allergy Anaphylaxis   Ciprofibrate Hives   Penicillins Hives    Has patient had a PCN reaction causing immediate rash, facial/tongue/throat swelling, SOB or lightheadedness with hypotension: Yes Has patient had a PCN  reaction causing severe rash involving mucus membranes or skin necrosis: No Has patient had a PCN reaction that required hospitalization No Has patient had a PCN reaction occurring within the last 10 years: No If all of the above answers are NO, then may proceed with Cephalosporin use.    Chocolate Itching   Wheat Itching    Metabolic Disorder Labs: Lab Results  Component Value Date   HGBA1C 5.5 01/16/2024   No results found for: PROLACTIN Lab Results  Component Value Date   CHOL 151 02/08/2023   TRIG 89 02/08/2023   HDL 44 02/08/2023   CHOLHDL 3 08/14/2019   VLDL 20.8 08/14/2019   LDLCALC 90 02/08/2023   LDLCALC 86 08/14/2019   Lab Results  Component Value Date   TSH 2.980 01/16/2024    Therapeutic Level Labs: No results found for: LITHIUM No results found for: CBMZ No results found for: VALPROATE  Current Medications: Current Outpatient Medications  Medication Sig Dispense Refill   albuterol  (PROVENTIL ) (2.5 MG/3ML) 0.083% nebulizer solution Take 3 mLs (2.5 mg total) by nebulization every 4 (four) hours as needed for wheezing or shortness of breath. 75 mL 0   albuterol  (VENTOLIN  HFA) 108 (90 Base) MCG/ACT inhaler Inhale 2 puffs into the lungs every 4 (four) hours as needed for wheezing or shortness of breath. 18 g 1   buPROPion  (WELLBUTRIN  XL) 300 MG 24 hr tablet TAKE ONE TABLET BY MOUTH ONCE A DAY 90 tablet 0   clotrimazole -betamethasone  (LOTRISONE ) cream Apply topically.     diphenhydrAMINE  (BENADRYL ) 25 MG tablet Take 25 mg by mouth as needed for allergies.      EPINEPHrine  0.3 mg/0.3 mL IJ SOAJ injection Inject 0.3 mg into the muscle as needed for anaphylaxis. 2 each 1  famotidine  (PEPCID ) 20 MG tablet Take 1 tablet (20 mg total) by mouth 2 (two) times daily. 60 tablet 5   fluticasone  (FLONASE ) 50 MCG/ACT nasal spray Place 2 sprays into both nostrils daily. 16 g 6   hydrOXYzine  (ATARAX ) 10 MG tablet Take 1 tablet (10 mg total) by mouth 3 (three) times  daily as needed. 30 tablet 2   levocetirizine (XYZAL ) 5 MG tablet Take 1 tablet (5 mg total) by mouth in the morning and at bedtime. 120 tablet 5   metFORMIN  (GLUCOPHAGE -XR) 500 MG 24 hr tablet TAKE 2 TABLETS BY MOUTH WITH DINNER 60 tablet 0   montelukast  (SINGULAIR ) 10 MG tablet Take 1 tablet (10 mg total) by mouth at bedtime. 30 tablet 5   Olopatadine  HCl (PATADAY ) 0.2 % SOLN 1 drop each eye daily as needed for itchy/watery eyes. 2.5 mL 3   omeprazole  (PRILOSEC) 40 MG capsule TAKE ONE CAPSULE BY MOUTH ONCE A DAY BEFORE BREAKFAST 90 capsule 3   sertraline  (ZOLOFT ) 50 MG tablet Take 1 tablet (50 mg total) by mouth daily. 30 tablet 3   topiramate  (TOPAMAX ) 100 MG tablet Take 1 tablet (100 mg total) by mouth daily before supper. 30 tablet 0   topiramate  (TOPAMAX ) 50 MG tablet Take one tab nightly with the 100mg  to equal 150mg  nightly 30 tablet 0   traZODone  (DESYREL ) 50 MG tablet TAKE ONE OR TWO TABLETS BY MOUTH AT BEDTIME AS NEEDED FOR SLEEP (Patient taking differently: Take 50 mg by mouth as needed.) 60 tablet 6   Vitamin D , Ergocalciferol , (DRISDOL ) 1.25 MG (50000 UNIT) CAPS capsule Take 1 capsule (50,000 Units total) by mouth every 3 (three) days. 12 capsule 0   Current Facility-Administered Medications  Medication Dose Route Frequency Provider Last Rate Last Admin   omalizumab  (XOLAIR ) prefilled syringe 300 mg  300 mg Subcutaneous Q28 days Jeneal Danita Macintosh, MD   300 mg at 02/14/23 1629    Musculoskeletal: Strength & Muscle Tone: within normal limits Gait & Station: normal Patient leans: N/A  Psychiatric Specialty Exam:  Psychiatric Specialty Exam: Blood pressure 121/78, pulse 84, height 5' 9 (1.753 m), weight (!) 312 lb (141.5 kg), last menstrual period 03/22/2024.Body mass index is 46.07 kg/m. Review of Systems  General Appearance: Casual and Fairly Groomed  Eye Contact:  Good  Speech:  Clear and Coherent  Volume:  Normal  Mood:  Euthymic  Affect:  Appropriate  Thought  Content: Logical   Suicidal Thoughts:  No  Homicidal Thoughts:  No  Thought Process:  Coherent  Orientation:  Full (Time, Place, and Person)    Memory: Recent;   Good Remote;   Good  Judgment:  Fair  Insight:  Fair  Concentration:  Concentration: Good and Attention Span: Good  Recall:  not formally assessed   Fund of Knowledge: Good  Language: Good  Psychomotor Activity:  Normal  Akathisia:  No  AIMS (if indicated): not done  Assets:  Communication Skills Desire for Improvement Housing Physical Health Resilience Transportation Vocational/Educational  ADL's:  Intact  Cognition: WNL  Sleep:  Fair    Screenings: GAD-7    Flowsheet Row [facility] Office Visit from 02/03/2024 in [facility] [place]  Total GAD-7 Score 18 12   PHQ2-9    Flowsheet Row Office Visit from 03/14/2024 in [facility] Office Visit from 02/03/2024 in [facility] [place] Office Visit from 01/05/2024 in [facility]  at [employer] from 10/07/2021 in [facility] [place] Office Visit from 02/15/2020 in [facility] [place]  PHQ-2 Total Score 6 4 3  0 0  PHQ-9 Total Score 20 14 17  -- --   Flowsheet Row UC from 06/13/2022 in [facility] ED from 11/26/2021 in [facility] at [facility] ED from 09/19/2021 in [facility] Department at [facility]  C-SSRS RISK CATEGORY No Risk No Risk No Risk     Collaboration of Care: Medication Management AEB Dr. Carvin, PCP notes  Patient/Guardian was advised Release of Information must be obtained prior to any record release in order to collaborate their care with an outside provider. Patient/Guardian was advised if they have not already done so to contact  the registration department to sign all necessary forms in order for us  to release information regarding their care.   Consent: Patient/Guardian gives verbal consent for treatment and assignment of benefits for services provided during this visit. Patient/Guardian expressed understanding and agreed to proceed.   Yumna Ebers, MD 1/14/20263:22 PM "

## 2024-04-25 ENCOUNTER — Encounter (HOSPITAL_COMMUNITY): Payer: Self-pay

## 2024-04-25 ENCOUNTER — Ambulatory Visit (HOSPITAL_COMMUNITY)

## 2024-04-25 ENCOUNTER — Encounter: Payer: Self-pay | Admitting: Adult Health

## 2024-04-25 ENCOUNTER — Ambulatory Visit: Admitting: Adult Health

## 2024-04-25 VITALS — BP 120/88 | HR 76 | Temp 98.8°F | Ht 69.0 in | Wt 316.0 lb

## 2024-04-25 DIAGNOSIS — F4310 Post-traumatic stress disorder, unspecified: Secondary | ICD-10-CM | POA: Diagnosis not present

## 2024-04-25 DIAGNOSIS — F419 Anxiety disorder, unspecified: Secondary | ICD-10-CM

## 2024-04-25 DIAGNOSIS — F411 Generalized anxiety disorder: Secondary | ICD-10-CM

## 2024-04-25 MED ORDER — HYDROXYZINE HCL 10 MG PO TABS: 10.0000 mg | ORAL_TABLET | Freq: Three times a day (TID) | ORAL | 2 refills | Status: AC | PRN

## 2024-04-25 NOTE — Progress Notes (Signed)
 "  Subjective:    Patient ID: Sydney Bradley, female    DOB: Apr 28, 1980, 44 y.o.   MRN: [id number]  HPI Discussed the use of AI scribe software for clinical note transcription with the patient, who gave verbal consent to proceed.  History of Present Illness   Sydney Bradley is a 44 year old female with anxiety, depression, PTSD, and Crohn's disease who presents with concerns about returning to work and its impact on her health.  She reports significant stress related to pressure from her [employer] employer to return to her prior position. She feels this work environment, particularly under her current supervisor, could worsen her anxiety and trigger PTSD.  She does want to work and feels like she has worked to hard get where she needs to be to quit or get fired, but feels like her current work environment is not good for her mental health.   She notes that stress from the employment situation is aggravating her Crohn's disease, with new nighttime bowel movements after a period of stability. She is concerned that returning to the previous workplace will further worsen both her mental health and Crohn's symptoms.        Review of Systems See HPI   Past Medical History:  Diagnosis Date   Allergy    Anxiety    Arthritis    L5 Back - no meds - otc prn   Asthma    Back pain    Constipation    Crohn's disease of ileum (HCC)    Fatty liver    GERD (gastroesophageal reflux disease)    High blood pressure    Hypertension    no meds x 3 yrs   IBS (irritable bowel syndrome)    Joint pain    Morbid obesity with BMI of 45.0-49.9, adult (HCC)    Recurrent upper respiratory infection (URI)    Sacral fracture (HCC)    Urticaria    Vitamin B 12 deficiency    Vitamin D  deficiency     Social History   Socioeconomic History   Marital status: Married    Spouse name: Not on file   Number of children: 1   Years of education: 18   Highest education level: Master's degree (e.g., MA, MS, MEng,  MEd, MSW, MBA)  Occupational History   Occupation: child psychotherapist    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[place], foster care   Married    One biologic son Judeth) born 2016 and a stepson   One caffeinated beverage daily   Social Drivers of Health   Tobacco Use: Low Risk (04/25/2024)   Patient History    Smoking Tobacco Use: Never    Smokeless Tobacco Use: Never    Passive Exposure: Past  Financial Resource Strain: Low Risk (04/04/2024)   Overall Financial Resource Strain (CARDIA)    Difficulty of Paying Living Expenses: Not very hard  Food Insecurity: Food Insecurity Present (04/04/2024)   Epic    Worried About  Running Out of Food in the Last Year: Sometimes true    Ran Out of Food in the Last Year: Never true  Transportation Needs: No Transportation Needs (04/04/2024)   Epic    Lack of Transportation (Medical): No    Lack of Transportation (Non-Medical): No  Physical Activity: Insufficiently Active (04/04/2024)   Exercise Vital Sign    Days of Exercise per Week: 1 day    Minutes of Exercise per Session: 10 min  Stress: Stress Concern Present (04/04/2024)   [employer] of Occupational [employer] - Occupational Stress Questionnaire    Feeling of Stress: Very much  Social Connections: Unknown (04/04/2024)   Social Connection and Isolation Panel    Frequency of Communication with Friends and Family: Twice a week    Frequency of Social Gatherings with Friends and Family: Patient declined    Attends Religious Services: More than 4 times per year     Active Member of Clubs or Organizations: Yes    Attends Banker Meetings: More than 4 times per year    Marital Status: Married  Catering Manager Violence: Not At Risk (02/12/2023)   Received from [facility]   HITS    Over the last 12 months how often did your partner physically hurt you?: Never    Over the last 12 months how often did your partner insult you or talk down to you?: Never    Over the last 12 months how often did your partner threaten you with physical harm?: Never    Over the last 12 months how often did your partner scream or curse at you?: Never  Depression (PHQ2-9): High Risk (03/14/2024)   Depression (PHQ2-9)    PHQ-2 Score: 20  Alcohol Screen: Low Risk (04/04/2024)   Alcohol Screen    Last Alcohol Screening Score (AUDIT): 1  Housing: High Risk (04/04/2024)   Epic    Unable to Pay for Housing in the Last Year: Yes    Number of Times Moved in the Last Year: 0    Homeless in the Last Year: No  Utilities: Not on file  Health Literacy: Not on file    Past Surgical History:  Procedure Laterality Date   ADENOIDECTOMY     COLONOSCOPY     DILATION AND EVACUATION  02/25/2012   Procedure: DILATATION AND EVACUATION;  Surgeon: Rosaline DELENA Luna, MD;  Location: [facility];  Service: Gynecology;  Laterality: N/A;   DILATION AND EVACUATION N/A 03/01/2013   Procedure: DILATATION AND EVACUATION;  Surgeon: Oneil FORBES Piety, MD;  Location: [facility];  Service: Gynecology;  Laterality: N/A;   TONSILLECTOMY     TONSILLECTOMY AND ADENOIDECTOMY  2007    Family History  Problem Relation Age of Onset   Depression Mother    Obesity Mother    Colon cancer Father 51       mets to liver and bone died at 19   Anxiety disorder Father    Bipolar disorder Father    Drug abuse Father    Obesity Father    Angioedema Sister    COPD Maternal Grandmother    Dementia Maternal Grandmother    Alzheimer's disease Maternal Grandfather    Cancer Paternal Grandmother        blood  cancer?   Stroke Paternal Grandmother    Eczema Son    Asthma Son    Allergic rhinitis Son    Stomach cancer Neg Hx    Esophageal cancer Neg Hx     Allergies[1]  Medications Ordered Prior to Encounter[2]  BP 120/88   Pulse 76   Temp 98.8 F (37.1 C) (Oral)   Ht 5' 9 (1.753 m)   Wt (!) 316 lb (143.3 kg)   LMP 04/12/2024 (Exact Date)   SpO2 98%   BMI 46.67 kg/m       Objective:   Physical Exam Vitals and nursing note reviewed.  Constitutional:      Appearance: Normal appearance.  Cardiovascular:     Rate and Rhythm: Normal rate and regular rhythm.     Pulses: Normal pulses.     Heart sounds: Normal heart sounds.  Pulmonary:     Effort: Pulmonary effort is normal.     Breath sounds: Normal breath sounds.  Skin:    General: Skin is warm and dry.  Neurological:     Mental Status: She is alert and oriented to person, place, and time.  Psychiatric:        Attention and Perception: Attention and perception normal.        Mood and Affect: Mood is depressed. Affect is angry.        Speech: Speech normal.        Behavior: Behavior normal.        Thought Content: Thought content normal.        Cognition and Memory: Cognition and memory normal.        Judgment: Judgment normal.           Assessment & Plan:  Assessment and Plan    Post-traumatic stress disorder and generalized anxiety disorder Increased anxiety and PTSD symptoms due to work-related stress and potential triggers. Advised to avoid high-stress situations and consider work from home or part-time work with a radiographer, therapeutic. - Wrote a physicist, medical to [facility] for reasonable accommodations, including work from home, part-time work, and radiographer, therapeutic. - Included weekly therapy and medical appointments in work schedule. - Ensured staff interactions with program manager are documented. - Advised against working on domestic violence cases, suggested in-office work. - Provided letter on  letterhead, sent via MyChart.       I personally spent a total of 43 minutes in the care of the patient today including preparing to see the patient, getting/reviewing separately obtained history, performing a medically appropriate exam/evaluation, counseling and educating, placing orders, documenting clinical information in the EHR, and writing ADA letter .  Darleene Shape, NP     [1]  Allergies Allergen Reactions   Ciprofloxacin Hives   Lamisil [Terbinafine] Hives   Latex Hives    Breathing problems   Other Anaphylaxis and Itching    Nuts Tree nuts   Peanut-Containing Drug Products Anaphylaxis    Allergy to all nuts    Shellfish Allergy Anaphylaxis   Ciprofibrate Hives   Penicillins Hives    Has patient had a PCN reaction causing immediate rash, facial/tongue/throat swelling, SOB or lightheadedness with hypotension: Yes Has patient had a PCN reaction causing severe rash involving mucus membranes or skin necrosis: No Has patient had a PCN reaction that required hospitalization No Has patient had a PCN reaction occurring within the last 10 years: No If all of the above answers are NO, then may proceed with Cephalosporin use.    Chocolate Itching   Wheat Itching  [2]  Current Outpatient Medications on File Prior to Visit  Medication Sig Dispense Refill   albuterol  (PROVENTIL ) (2.5 MG/3ML) 0.083% nebulizer solution Take 3 mLs (2.5 mg total) by nebulization every 4 (four) hours as needed  for wheezing or shortness of breath. 75 mL 0   albuterol  (VENTOLIN  HFA) 108 (90 Base) MCG/ACT inhaler Inhale 2 puffs into the lungs every 4 (four) hours as needed for wheezing or shortness of breath. 18 g 1   buPROPion  (WELLBUTRIN  XL) 300 MG 24 hr tablet TAKE ONE TABLET BY MOUTH ONCE A DAY 90 tablet 0   clotrimazole -betamethasone  (LOTRISONE ) cream Apply topically.     diphenhydrAMINE  (BENADRYL ) 25 MG tablet Take 25 mg by mouth as needed for allergies.      EPINEPHrine  0.3 mg/0.3 mL IJ SOAJ  injection Inject 0.3 mg into the muscle as needed for anaphylaxis. 2 each 1   famotidine  (PEPCID ) 20 MG tablet Take 1 tablet (20 mg total) by mouth 2 (two) times daily. 60 tablet 5   fluticasone  (FLONASE ) 50 MCG/ACT nasal spray Place 2 sprays into both nostrils daily. 16 g 6   hydrOXYzine  (ATARAX ) 10 MG tablet Take 1 tablet (10 mg total) by mouth 3 (three) times daily as needed. 30 tablet 1   levocetirizine (XYZAL ) 5 MG tablet Take 1 tablet (5 mg total) by mouth in the morning and at bedtime. 120 tablet 5   metFORMIN  (GLUCOPHAGE -XR) 500 MG 24 hr tablet TAKE 2 TABLETS BY MOUTH WITH DINNER 60 tablet 0   montelukast  (SINGULAIR ) 10 MG tablet Take 1 tablet (10 mg total) by mouth at bedtime. 30 tablet 5   Olopatadine  HCl (PATADAY ) 0.2 % SOLN 1 drop each eye daily as needed for itchy/watery eyes. 2.5 mL 3   omeprazole  (PRILOSEC) 40 MG capsule TAKE ONE CAPSULE BY MOUTH ONCE A DAY BEFORE BREAKFAST 90 capsule 3   sertraline  (ZOLOFT ) 50 MG tablet Take 1 tablet (50 mg total) by mouth daily. 30 tablet 3   topiramate  (TOPAMAX ) 100 MG tablet Take 1 tablet (100 mg total) by mouth daily before supper. 30 tablet 0   topiramate  (TOPAMAX ) 50 MG tablet Take one tab nightly with the 100mg  to equal 150mg  nightly 30 tablet 0   traZODone  (DESYREL ) 50 MG tablet TAKE ONE OR TWO TABLETS BY MOUTH AT BEDTIME AS NEEDED FOR SLEEP (Patient taking differently: Take 50 mg by mouth as needed.) 60 tablet 6   Vitamin D , Ergocalciferol , (DRISDOL ) 1.25 MG (50000 UNIT) CAPS capsule Take 1 capsule (50,000 Units total) by mouth every 3 (three) days. 12 capsule 0   Current Facility-Administered Medications on File Prior to Visit  Medication Dose Route Frequency Provider Last Rate Last Admin   omalizumab  (XOLAIR ) prefilled syringe 300 mg  300 mg Subcutaneous Q28 days Jeneal Danita Macintosh, MD   300 mg at 02/14/23 1629   "

## 2024-04-25 NOTE — Addendum Note (Signed)
 Addended by: CARVIN CROCK on: 04/25/2024 04:31 PM   Modules accepted: Level of Service

## 2024-04-27 ENCOUNTER — Encounter: Payer: Self-pay | Admitting: Adult Health

## 2024-05-08 ENCOUNTER — Ambulatory Visit (INDEPENDENT_AMBULATORY_CARE_PROVIDER_SITE_OTHER): Admitting: Adult Health

## 2024-05-08 ENCOUNTER — Encounter (INDEPENDENT_AMBULATORY_CARE_PROVIDER_SITE_OTHER): Payer: Self-pay | Admitting: Adult Health

## 2024-05-08 VITALS — BP 124/86 | HR 99 | Temp 98.7°F | Ht 69.0 in | Wt 307.0 lb

## 2024-05-08 DIAGNOSIS — R7303 Prediabetes: Secondary | ICD-10-CM

## 2024-05-08 DIAGNOSIS — E559 Vitamin D deficiency, unspecified: Secondary | ICD-10-CM | POA: Diagnosis not present

## 2024-05-08 DIAGNOSIS — F5089 Other specified eating disorder: Secondary | ICD-10-CM | POA: Diagnosis not present

## 2024-05-08 DIAGNOSIS — E669 Obesity, unspecified: Secondary | ICD-10-CM

## 2024-05-08 DIAGNOSIS — Z6841 Body Mass Index (BMI) 40.0 and over, adult: Secondary | ICD-10-CM | POA: Diagnosis not present

## 2024-05-08 DIAGNOSIS — F4310 Post-traumatic stress disorder, unspecified: Secondary | ICD-10-CM | POA: Diagnosis not present

## 2024-05-08 DIAGNOSIS — R632 Polyphagia: Secondary | ICD-10-CM

## 2024-05-08 MED ORDER — TOPIRAMATE 100 MG PO TABS: 100.0000 mg | ORAL_TABLET | Freq: Every day | ORAL | 0 refills | Status: AC

## 2024-05-08 MED ORDER — METFORMIN HCL ER 500 MG PO TB24: ORAL_TABLET | ORAL | 0 refills | Status: AC

## 2024-05-08 MED ORDER — VITAMIN D (ERGOCALCIFEROL) 1.25 MG (50000 UNIT) PO CAPS: 50000.0000 [IU] | ORAL_CAPSULE | ORAL | 0 refills | Status: AC

## 2024-05-08 MED ORDER — TOPIRAMATE 50 MG PO TABS: ORAL_TABLET | ORAL | 0 refills | Status: AC

## 2024-05-08 NOTE — Progress Notes (Signed)
 "    WEIGHT SUMMARY AND BIOMETRICS  Vitals Temp: 98.7 F (37.1 C) BP: 124/86 Pulse Rate: 99 SpO2: 98 %   Anthropometric Measurements Height: 5' 9 (1.753 m) Weight: (!) 307 lb (139.3 kg) BMI (Calculated): 45.32 Weight at Last Visit: 315lb Weight Lost Since Last Visit: 8lb Weight Gained Since Last Visit: 0lb Starting Weight: 343lb Total Weight Loss (lbs): 36 lb (16.3 kg)   Body Composition  Body Fat %: 52.3 % Fat Mass (lbs): 160.8 lbs Muscle Mass (lbs): 139.4 lbs Total Body Water (lbs): 95.6 lbs Visceral Fat Rating : 17   Other Clinical Data RMR: [id number] Fasting: Yes Labs: No Today's Visit #: 16 Starting Date: 02/08/23    Chief Complaint:   OBESITY Sydney Bradley is here to discuss her progress with her obesity treatment plan.  She is on the the Category 3 Plan and states she is following her eating plan approximately 75 % of the time.  She states she is exercising Walking 10-15 minutes 3 times per week.  Interim History:  She lives with husband and children: 33 year old son 63 year old son- [employer] 10 year old niece- sisters child- joined home late Dec 2025- [employer]  She continues to apply for ADA at with her employer - all requests has been denied. She will be required to return in office FT next week. She endorses stress and anxiety with her upcoming RTW She reports support from her husband, PCP, and psychiatrist   Subjective:   1. Vitamin D  deficiency  Latest Reference Range & Units 02/08/23 09:54 11/09/23 09:01 01/16/24 11:08  Vitamin D , 25-Hydroxy 30.0 - 100.0 ng/mL 21.1 (L) 19.9 (L) 24.0 (L)  (L): Data is abnormally low  She is on twice weekly Ergocalciferol   2. Prediabetes Lab Results  Component Value Date   HGBA1C 5.5 01/16/2024   HGBA1C 5.4 11/09/2023   HGBA1C 5.7 (H) 02/08/2023     She is also on Metformin  XR 500mg  2 tabs at dinner She denies GI upset with use  3. PTSD (post-traumatic stress  disorder) 04/25/2024 PCP OV Notes History of Present Illness   Sydney Bradley is a 44 year old female with anxiety, depression, PTSD, and Crohn's disease who presents with concerns about returning to work and its impact on her health.   She reports significant stress related to pressure from her [employer] [facility] [employer] employer to return to her prior position. She feels this work environment, particularly under her current supervisor, could worsen her anxiety and trigger PTSD.  She does want to work and feels like she has worked to hard get where she needs to be to quit or get fired, but feels like her current work environment is not good for her mental health.    She notes that stress from the employment situation is aggravating her Crohn's disease, with new nighttime bowel movements after a period of stability. She is concerned that returning to the previous workplace will further worsen both her mental health and Crohn's symptoms.   4. Other disorder of eating 04/09/2024-Topamax  100mg  nightly increased to 150mg  nightly She endorses decreased evening polyphagia She has continued Metformin  XR 500mg - 2 tabs at dinner BP excellent at OV She denies parethesias or word finding problems.   Assessment/Plan:   1. Vitamin D  deficiency Check Labs -Vit D Def  Refill   Vitamin D , Ergocalciferol , (DRISDOL ) 1.25 MG (50000 UNIT) CAPS capsule Take 1 capsule (50,000 Units total) by mouth every 3 (three) days. Dispense: 12 capsule, Refills:  0 ordered   2. Prediabetes Check LABS -A1c -Insulin  Random -Mg++ -CMP -B12   Refill metFORMIN  (GLUCOPHAGE -XR) 500 MG 24 hr tablet TAKE 2 TABLETS BY MOUTH WITH DINNER Dispense: 60 tablet, Refills: 0 ordered   3. PTSD (post-traumatic stress disorder) (Primary) 04/25/2024 PCP OV Notes Assessment & Plan:  Assessment and Plan    Post-traumatic stress disorder and generalized anxiety disorder Increased anxiety and PTSD symptoms due to work-related stress and potential  triggers. Advised to avoid high-stress situations and consider work from home or part-time work with a radiographer, therapeutic. - Wrote a physicist, medical to [facility] for reasonable accommodations, including work from home, part-time work, and radiographer, therapeutic. - Included weekly therapy and medical appointments in work schedule. - Ensured staff interactions with program manager are documented. - Advised against working on domestic violence cases, suggested in-office work. - Provided letter on letterhead, sent via MyChart.   4. Other disorder of eating Refill topiramate  (TOPAMAX ) 100 MG tablet Take 1 tablet (100 mg total) by mouth daily before supper. Dispense: 30 tablet, Refills: 0 ordered   05/08/2024 -- Gurnoor Sloop, Rockie D, NP            []   topiramate  (TOPAMAX ) 50 MG tablet Take one tab nightly with the 100mg  to equal 150mg  nightly Dispense: 30 tablet, Refills: 0 ordered              5. BMI 45.0-49.9, adult (HCC), CURRENT BMI 45.4  Sydney Bradley is currently in the action stage of change. As such, her goal is to continue with weight loss efforts. She has agreed to the Category 3 Plan.   Exercise goals: For substantial health benefits, adults should do at least 150 minutes (2 hours and 30 minutes) a week of moderate-intensity, or 75 minutes (1 hour and 15 minutes) a week of vigorous-intensity aerobic physical activity, or an equivalent combination of moderate- and vigorous-intensity aerobic activity. Aerobic activity should be performed in episodes of at least 10 minutes, and preferably, it should be spread throughout the week.  Behavioral modification strategies: increasing lean protein intake, decreasing simple carbohydrates, increasing vegetables, increasing water intake, meal planning and cooking strategies, keeping healthy foods in the home, ways to avoid boredom eating, and planning for success.  Priscila has agreed to follow-up with our clinic in 4 weeks. She was informed of the importance of frequent  follow-up visits to maximize her success with intensive lifestyle modifications for her multiple health conditions.   Jerome was informed we would discuss her lab results at her next visit unless there is a critical issue that needs to be addressed sooner. Delila agreed to keep her next visit at the agreed upon time to discuss these results.  Objective:   Blood pressure 124/86, pulse 99, temperature 98.7 F (37.1 C), height 5' 9 (1.753 m), weight (!) 307 lb (139.3 kg), last menstrual period 04/12/2024, SpO2 98%. Body mass index is 45.34 kg/m.  General: Cooperative, alert, well developed, in no acute distress. HEENT: Conjunctivae and lids unremarkable. Cardiovascular: Regular rhythm.  Lungs: Normal work of breathing. Neurologic: No focal deficits.   Lab Results  Component Value Date   CREATININE 1.00 01/16/2024   BUN 8 01/16/2024   NA 138 01/16/2024   K 4.1 01/16/2024   CL 107 (H) 01/16/2024   CO2 18 (L) 01/16/2024   Lab Results  Component Value Date   ALT 12 01/16/2024   AST 14 01/16/2024   ALKPHOS 75 01/16/2024   BILITOT 0.4 01/16/2024   Lab Results  Component  Value Date   HGBA1C 5.5 01/16/2024   HGBA1C 5.4 11/09/2023   HGBA1C 5.7 (H) 02/08/2023   HGBA1C 5.6 08/14/2019   HGBA1C 5.8 11/11/2017   Lab Results  Component Value Date   INSULIN  20.1 01/16/2024   INSULIN  20.0 11/09/2023   INSULIN  12.3 02/08/2023   Lab Results  Component Value Date   TSH 2.980 01/16/2024   Lab Results  Component Value Date   CHOL 151 02/08/2023   HDL 44 02/08/2023   LDLCALC 90 02/08/2023   TRIG 89 02/08/2023   CHOLHDL 3 08/14/2019   Lab Results  Component Value Date   VD25OH 24.0 (L) 01/16/2024   VD25OH 19.9 (L) 11/09/2023   VD25OH 21.1 (L) 02/08/2023   Lab Results  Component Value Date   WBC 5.6 09/26/2023   HGB 12.8 09/26/2023   HCT 39.5 09/26/2023   MCV 82.6 09/26/2023   PLT 249.0 09/26/2023   Lab Results  Component Value Date   IRON 74 12/18/2021   TIBC 289.8  12/18/2021   FERRITIN 77.6 12/18/2021    Attestation Statements:   Reviewed by clinician on day of visit: allergies, medications, problem list, medical history, surgical history, family history, social history, and previous encounter notes.  I have reviewed the above documentation for accuracy and completeness, and I agree with the above. -  Korynne Dols d. Vassie Kugel, NP-C "

## 2024-05-09 LAB — HEMOGLOBIN A1C
Est. average glucose Bld gHb Est-mCnc: 108 mg/dL
Hgb A1c MFr Bld: 5.4 % (ref 4.8–5.6)

## 2024-05-09 LAB — COMPREHENSIVE METABOLIC PANEL WITH GFR
ALT: 16 [IU]/L (ref 0–32)
AST: 15 [IU]/L (ref 0–40)
Albumin: 4.1 g/dL (ref 3.9–4.9)
Alkaline Phosphatase: 74 [IU]/L (ref 41–116)
BUN/Creatinine Ratio: 8 — ABNORMAL LOW (ref 9–23)
BUN: 8 mg/dL (ref 6–24)
Bilirubin Total: 0.8 mg/dL (ref 0.0–1.2)
CO2: 18 mmol/L — ABNORMAL LOW (ref 20–29)
Calcium: 9.3 mg/dL (ref 8.7–10.2)
Chloride: 108 mmol/L — ABNORMAL HIGH (ref 96–106)
Creatinine, Ser: 0.97 mg/dL (ref 0.57–1.00)
Globulin, Total: 3 g/dL (ref 1.5–4.5)
Glucose: 101 mg/dL — ABNORMAL HIGH (ref 70–99)
Potassium: 4.2 mmol/L (ref 3.5–5.2)
Sodium: 140 mmol/L (ref 134–144)
Total Protein: 7.1 g/dL (ref 6.0–8.5)
eGFR: 74 mL/min/{1.73_m2}

## 2024-05-09 LAB — INSULIN, RANDOM: INSULIN: 18.4 u[IU]/mL (ref 2.6–24.9)

## 2024-05-09 LAB — VITAMIN D 25 HYDROXY (VIT D DEFICIENCY, FRACTURES): Vit D, 25-Hydroxy: 36.8 ng/mL (ref 30.0–100.0)

## 2024-05-09 LAB — VITAMIN B12: Vitamin B-12: 272 pg/mL (ref 232–1245)

## 2024-05-09 LAB — MAGNESIUM: Magnesium: 2 mg/dL (ref 1.6–2.3)

## 2024-05-16 NOTE — Telephone Encounter (Signed)
 FYI

## 2024-05-18 ENCOUNTER — Other Ambulatory Visit: Payer: Self-pay | Admitting: Adult Health

## 2024-06-04 ENCOUNTER — Ambulatory Visit (INDEPENDENT_AMBULATORY_CARE_PROVIDER_SITE_OTHER): Admitting: Physician Assistant

## 2024-06-27 ENCOUNTER — Ambulatory Visit (HOSPITAL_COMMUNITY)

## 2024-08-23 ENCOUNTER — Ambulatory Visit: Admitting: Allergy
